# Patient Record
Sex: Female | Born: 1950 | Race: Black or African American | Hispanic: No | Marital: Single | Smoking: Former smoker
Health system: Southern US, Community
[De-identification: ages and names within clinical notes are randomized; demographics above are authoritative.]

## PROBLEM LIST (undated history)

## (undated) DIAGNOSIS — I729 Aneurysm of unspecified site: Secondary | ICD-10-CM

## (undated) DIAGNOSIS — I10 Essential (primary) hypertension: Secondary | ICD-10-CM

## (undated) DIAGNOSIS — R609 Edema, unspecified: Secondary | ICD-10-CM

## (undated) DIAGNOSIS — Z8719 Personal history of other diseases of the digestive system: Secondary | ICD-10-CM

## (undated) DIAGNOSIS — M359 Systemic involvement of connective tissue, unspecified: Secondary | ICD-10-CM

## (undated) DIAGNOSIS — J449 Chronic obstructive pulmonary disease, unspecified: Secondary | ICD-10-CM

## (undated) DIAGNOSIS — D649 Anemia, unspecified: Secondary | ICD-10-CM

## (undated) DIAGNOSIS — M1990 Unspecified osteoarthritis, unspecified site: Secondary | ICD-10-CM

## (undated) DIAGNOSIS — J45909 Unspecified asthma, uncomplicated: Secondary | ICD-10-CM

## (undated) DIAGNOSIS — E039 Hypothyroidism, unspecified: Secondary | ICD-10-CM

## (undated) DIAGNOSIS — D860 Sarcoidosis of lung: Secondary | ICD-10-CM

## (undated) DIAGNOSIS — Z87898 Personal history of other specified conditions: Secondary | ICD-10-CM

## (undated) DIAGNOSIS — I2510 Atherosclerotic heart disease of native coronary artery without angina pectoris: Secondary | ICD-10-CM

## (undated) DIAGNOSIS — G4730 Sleep apnea, unspecified: Secondary | ICD-10-CM

## (undated) DIAGNOSIS — E119 Type 2 diabetes mellitus without complications: Secondary | ICD-10-CM

## (undated) DIAGNOSIS — K219 Gastro-esophageal reflux disease without esophagitis: Secondary | ICD-10-CM

## (undated) DIAGNOSIS — F0390 Unspecified dementia without behavioral disturbance: Secondary | ICD-10-CM

## (undated) DIAGNOSIS — G629 Polyneuropathy, unspecified: Secondary | ICD-10-CM

## (undated) DIAGNOSIS — R569 Unspecified convulsions: Secondary | ICD-10-CM

## (undated) DIAGNOSIS — I739 Peripheral vascular disease, unspecified: Secondary | ICD-10-CM

## (undated) DIAGNOSIS — M109 Gout, unspecified: Secondary | ICD-10-CM

## (undated) DIAGNOSIS — R0902 Hypoxemia: Secondary | ICD-10-CM

## (undated) HISTORY — DX: Type 2 diabetes mellitus without complications: E11.9

## (undated) HISTORY — PX: STENT PLACEMENT VASCULAR (ARMC HX): HXRAD1737

## (undated) HISTORY — PX: FRACTURE SURGERY: SHX138

## (undated) HISTORY — DX: Unspecified dementia, unspecified severity, without behavioral disturbance, psychotic disturbance, mood disturbance, and anxiety: F03.90

## (undated) HISTORY — DX: Essential (primary) hypertension: I10

## (undated) HISTORY — PX: VEIN BYPASS SURGERY: SHX833

## (undated) HISTORY — DX: Unspecified osteoarthritis, unspecified site: M19.90

## (undated) HISTORY — DX: Unspecified asthma, uncomplicated: J45.909

## (undated) HISTORY — DX: Aneurysm of unspecified site: I72.9

## (undated) HISTORY — PX: CORONARY ARTERY BYPASS GRAFT: SHX141

---

## 1989-07-17 HISTORY — PX: CEREBRAL ANEURYSM REPAIR: SHX164

## 1989-07-17 HISTORY — PX: BRAIN SURGERY: SHX531

## 1998-11-16 HISTORY — PX: HERNIA REPAIR: SHX51

## 1999-07-03 DIAGNOSIS — I10 Essential (primary) hypertension: Secondary | ICD-10-CM | POA: Insufficient documentation

## 1999-07-18 HISTORY — PX: EYE SURGERY: SHX253

## 2004-09-22 ENCOUNTER — Other Ambulatory Visit: Payer: Self-pay

## 2004-09-22 ENCOUNTER — Emergency Department: Payer: Self-pay | Admitting: Emergency Medicine

## 2004-09-25 ENCOUNTER — Emergency Department: Payer: Self-pay | Admitting: Emergency Medicine

## 2006-04-11 ENCOUNTER — Other Ambulatory Visit: Payer: Self-pay

## 2006-04-11 ENCOUNTER — Inpatient Hospital Stay: Payer: Self-pay | Admitting: Internal Medicine

## 2006-05-03 ENCOUNTER — Ambulatory Visit: Payer: Self-pay | Admitting: Internal Medicine

## 2006-05-04 ENCOUNTER — Ambulatory Visit: Payer: Self-pay | Admitting: Gastroenterology

## 2006-05-16 ENCOUNTER — Ambulatory Visit: Payer: Self-pay | Admitting: Internal Medicine

## 2006-06-04 ENCOUNTER — Ambulatory Visit (HOSPITAL_COMMUNITY): Admission: RE | Admit: 2006-06-04 | Discharge: 2006-06-04 | Payer: Self-pay | Admitting: *Deleted

## 2006-06-16 ENCOUNTER — Observation Stay (HOSPITAL_COMMUNITY): Admission: RE | Admit: 2006-06-16 | Discharge: 2006-06-17 | Payer: Self-pay | Admitting: *Deleted

## 2006-08-04 ENCOUNTER — Observation Stay (HOSPITAL_COMMUNITY): Admission: AD | Admit: 2006-08-04 | Discharge: 2006-08-04 | Payer: Self-pay | Admitting: *Deleted

## 2007-01-24 ENCOUNTER — Ambulatory Visit (HOSPITAL_COMMUNITY): Admission: RE | Admit: 2007-01-24 | Discharge: 2007-01-24 | Payer: Self-pay | Admitting: *Deleted

## 2007-04-10 ENCOUNTER — Emergency Department: Payer: Self-pay | Admitting: Unknown Physician Specialty

## 2007-04-18 ENCOUNTER — Emergency Department: Payer: Self-pay | Admitting: Internal Medicine

## 2007-06-23 ENCOUNTER — Emergency Department: Payer: Self-pay | Admitting: Emergency Medicine

## 2007-06-24 ENCOUNTER — Other Ambulatory Visit: Payer: Self-pay

## 2007-08-08 ENCOUNTER — Emergency Department: Payer: Self-pay | Admitting: Emergency Medicine

## 2008-02-19 ENCOUNTER — Emergency Department: Payer: Self-pay | Admitting: Emergency Medicine

## 2008-02-20 ENCOUNTER — Other Ambulatory Visit: Payer: Self-pay

## 2008-02-25 ENCOUNTER — Emergency Department: Payer: Self-pay | Admitting: Emergency Medicine

## 2012-04-13 ENCOUNTER — Ambulatory Visit: Payer: Self-pay | Admitting: Gastroenterology

## 2012-10-21 DIAGNOSIS — M1710 Unilateral primary osteoarthritis, unspecified knee: Secondary | ICD-10-CM | POA: Insufficient documentation

## 2013-07-03 ENCOUNTER — Emergency Department: Payer: Self-pay | Admitting: Emergency Medicine

## 2013-07-03 LAB — URINALYSIS, COMPLETE
Bacteria: NONE SEEN
Bilirubin,UR: NEGATIVE
Blood: NEGATIVE
Glucose,UR: NEGATIVE mg/dL (ref 0–75)
Ketone: NEGATIVE
Ph: 5 (ref 4.5–8.0)
RBC,UR: <1 /HPF (ref 0–5)
WBC UR: 3 /HPF (ref 0–5)

## 2013-09-05 DIAGNOSIS — E118 Type 2 diabetes mellitus with unspecified complications: Secondary | ICD-10-CM | POA: Insufficient documentation

## 2013-09-11 ENCOUNTER — Ambulatory Visit: Payer: Self-pay | Admitting: Podiatry

## 2013-09-11 ENCOUNTER — Encounter: Payer: Self-pay | Admitting: *Deleted

## 2013-09-20 ENCOUNTER — Encounter: Payer: Self-pay | Admitting: Podiatry

## 2013-09-20 ENCOUNTER — Ambulatory Visit (INDEPENDENT_AMBULATORY_CARE_PROVIDER_SITE_OTHER): Payer: PRIVATE HEALTH INSURANCE

## 2013-09-20 ENCOUNTER — Ambulatory Visit (INDEPENDENT_AMBULATORY_CARE_PROVIDER_SITE_OTHER): Payer: PRIVATE HEALTH INSURANCE | Admitting: Podiatry

## 2013-09-20 VITALS — Ht 65.0 in | Wt 210.0 lb

## 2013-09-20 DIAGNOSIS — R52 Pain, unspecified: Secondary | ICD-10-CM

## 2013-09-20 DIAGNOSIS — B351 Tinea unguium: Secondary | ICD-10-CM

## 2013-09-20 DIAGNOSIS — M7750 Other enthesopathy of unspecified foot: Secondary | ICD-10-CM

## 2013-09-20 DIAGNOSIS — M778 Other enthesopathies, not elsewhere classified: Secondary | ICD-10-CM

## 2013-09-20 NOTE — Patient Instructions (Signed)

## 2013-09-20 NOTE — Progress Notes (Signed)
Chelsey [name] presents today complaining of gout times past 2 weeks to the first metatarsophalangeal joint of the bilateral foot. She states it should also like to have her toenails cut. She's been waiting for this appointment and that she's been waiting her got has got this better some degree.  Objective: Pulses barely palpable bilateral. Feet are warm to the touch. Capillary fill time is immediate. First and second metatarsophalangeal joints are warm to the touch. Nails are thick yellow dystrophic onychomycotic.  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 Onychomycosis 1 through 5 bilateral.  Plan: Injected to get milligrams of dexamethasone intra-articularly to each first metatarsophalangeal joint bilaterally. I also debrided her nails 1 through 5 bilateral. Followup with her in 3 months.

## 2013-09-26 ENCOUNTER — Telehealth: Payer: Self-pay | Admitting: *Deleted

## 2013-09-26 NOTE — Telephone Encounter (Signed)
DR HYATT GAVE ME TWO SHOTS LAST WEEK FOR THE GOUT AND IT IS NOT DOING ANY GOOD, I NEED TO KNOW WHAT ELSE I CAN DO ?

## 2013-09-26 NOTE — Telephone Encounter (Signed)
Inform Chelsey [name] that it may take more than a week for the medication to work.  She should follow up with me in the near future.

## 2013-09-27 NOTE — Telephone Encounter (Signed)
Spoke to [facility] told her to give the medication time to work if it does not get any better to follow up with dr Milinda Pointer

## 2013-12-20 ENCOUNTER — Encounter: Payer: Self-pay | Admitting: Podiatry

## 2013-12-20 ENCOUNTER — Ambulatory Visit (INDEPENDENT_AMBULATORY_CARE_PROVIDER_SITE_OTHER): Payer: PRIVATE HEALTH INSURANCE | Admitting: Podiatry

## 2013-12-20 VITALS — BP 129/70 | HR 80 | Resp 16

## 2013-12-20 DIAGNOSIS — B351 Tinea unguium: Secondary | ICD-10-CM

## 2013-12-20 DIAGNOSIS — E1149 Type 2 diabetes mellitus with other diabetic neurological complication: Secondary | ICD-10-CM

## 2013-12-20 DIAGNOSIS — Q828 Other specified congenital malformations of skin: Secondary | ICD-10-CM

## 2013-12-20 DIAGNOSIS — M79609 Pain in unspecified limb: Secondary | ICD-10-CM

## 2013-12-20 NOTE — Progress Notes (Signed)
She presents today for routine footcare complaining of corns and calluses plantar aspect of the bilateral foot and ingrown toenails.  Objective: Pulses are barely palpable bilateral. Reactive hyperkeratosis sub-first metatarsophalangeal joint is present bilateral. Nails are thick yellow dystrophic clinically mycotic and painful palpation.  Assessment: Pain in limb secondary to onychomycosis 1 through 5 bilateral.  Plan: Debridement nails 1 through 5 bilateral is cover service secondary to pain and debridement reactive hyperkeratosis bilateral.

## 2014-02-28 ENCOUNTER — Ambulatory Visit (INDEPENDENT_AMBULATORY_CARE_PROVIDER_SITE_OTHER): Payer: PRIVATE HEALTH INSURANCE

## 2014-02-28 ENCOUNTER — Ambulatory Visit (INDEPENDENT_AMBULATORY_CARE_PROVIDER_SITE_OTHER): Payer: PRIVATE HEALTH INSURANCE | Admitting: Podiatry

## 2014-02-28 VITALS — Resp 16 | Ht 65.0 in | Wt 195.0 lb

## 2014-02-28 DIAGNOSIS — B351 Tinea unguium: Secondary | ICD-10-CM

## 2014-02-28 DIAGNOSIS — M2040 Other hammer toe(s) (acquired), unspecified foot: Secondary | ICD-10-CM

## 2014-02-28 DIAGNOSIS — E1149 Type 2 diabetes mellitus with other diabetic neurological complication: Secondary | ICD-10-CM

## 2014-02-28 DIAGNOSIS — M79609 Pain in unspecified limb: Secondary | ICD-10-CM

## 2014-02-28 DIAGNOSIS — M7752 Other enthesopathy of left foot: Secondary | ICD-10-CM

## 2014-02-28 DIAGNOSIS — M7750 Other enthesopathy of unspecified foot: Secondary | ICD-10-CM

## 2014-02-28 DIAGNOSIS — Q828 Other specified congenital malformations of skin: Secondary | ICD-10-CM

## 2014-02-28 NOTE — Progress Notes (Signed)
She presents today a chief complaint of a painful hallux left her nails are also thick yellow dystrophic and clinically mycotic.  Objective: I have reviewed her past medical history medications and allergies she is has a history of diabetic peripheral neuropathy as well as diabetic angiopathy. Hammertoe deformities and a history of ulceration. Currently she has pain on palpation to the IP joint of the hallux left with nails that are thick yellow dystrophic and clinically mycotic.  Assessment: Non-insulin-dependent diabetes mellitus with a history of diabetic peripheral neuropathy and angiopathy and hammertoe deformity and a history of ulcer. Nails are thick yellow dystrophic with mycotic painful palpation. Capsulitis of the IP joint hallux left.  Plan: Injected the IP joint of the hallux left today with dexamethasone and local anesthetic debridement all reactive hyperkeratosis and she was measured for diabetic shoes.

## 2014-04-02 ENCOUNTER — Encounter: Payer: Self-pay | Admitting: *Deleted

## 2014-04-02 NOTE — Progress Notes (Signed)
Sent pt post card letting her know dr colford is not responding to the diabetic shoe request. Asked pt to contact her doctors office and find out why then to call us with the info she finds out.

## 2014-04-24 ENCOUNTER — Encounter: Payer: Self-pay | Admitting: *Deleted

## 2014-04-24 NOTE — Progress Notes (Signed)
Sent pt postcard letting her know diabetic shoes are here. 

## 2014-05-14 ENCOUNTER — Encounter: Payer: Self-pay | Admitting: Podiatry

## 2014-05-14 ENCOUNTER — Ambulatory Visit (INDEPENDENT_AMBULATORY_CARE_PROVIDER_SITE_OTHER): Payer: PRIVATE HEALTH INSURANCE | Admitting: *Deleted

## 2014-05-14 VITALS — BP 129/70 | HR 80 | Resp 16

## 2014-05-14 DIAGNOSIS — E1149 Type 2 diabetes mellitus with other diabetic neurological complication: Secondary | ICD-10-CM

## 2014-05-14 DIAGNOSIS — M2040 Other hammer toe(s) (acquired), unspecified foot: Secondary | ICD-10-CM

## 2014-05-14 NOTE — Progress Notes (Signed)
Pt presents for pick up for diabetic shoes.

## 2014-05-29 ENCOUNTER — Emergency Department: Payer: Self-pay | Admitting: Internal Medicine

## 2014-06-04 ENCOUNTER — Encounter: Payer: Self-pay | Admitting: Podiatry

## 2014-06-04 ENCOUNTER — Ambulatory Visit (INDEPENDENT_AMBULATORY_CARE_PROVIDER_SITE_OTHER): Payer: PRIVATE HEALTH INSURANCE | Admitting: Podiatry

## 2014-06-04 ENCOUNTER — Ambulatory Visit (INDEPENDENT_AMBULATORY_CARE_PROVIDER_SITE_OTHER): Payer: PRIVATE HEALTH INSURANCE

## 2014-06-04 DIAGNOSIS — Q828 Other specified congenital malformations of skin: Secondary | ICD-10-CM

## 2014-06-04 DIAGNOSIS — Q6652 Congenital pes planus, left foot: Secondary | ICD-10-CM

## 2014-06-04 DIAGNOSIS — M79676 Pain in unspecified toe(s): Secondary | ICD-10-CM

## 2014-06-04 DIAGNOSIS — Q6650 Congenital pes planus, unspecified foot: Secondary | ICD-10-CM

## 2014-06-04 DIAGNOSIS — E1149 Type 2 diabetes mellitus with other diabetic neurological complication: Secondary | ICD-10-CM

## 2014-06-04 DIAGNOSIS — M779 Enthesopathy, unspecified: Secondary | ICD-10-CM

## 2014-06-04 DIAGNOSIS — M79609 Pain in unspecified limb: Secondary | ICD-10-CM

## 2014-06-04 DIAGNOSIS — B351 Tinea unguium: Secondary | ICD-10-CM

## 2014-06-04 NOTE — Progress Notes (Signed)
Chelsey [name] presents today chief complaint of painful E. elongated toenails with corns and calluses. She states her diabetes is doing about the same. States that she has made her recent trip to the emergency department for a diagnosis of gout back in here a she points to the subtalar joint bilaterally.  Objective evaluation reveals vital signs are stable she is alert and oriented x3. Pulses are palpable bilateral. She has pain on palpation and on end range of motion of the subtalar joint bilateral. There is no erythema no edema no cellulitis drainage or odor and there is no warmth to touch. Radiographic evaluation does not demonstrate any type of osseous abnormalities in this area other than a cyst to the cuboid left foot. Nails are thick yellow dystrophic clinically mycotic. Multiple areas of porokeratosis to the plantar aspect of the bilateral foot. No ulcerations are noted.  Assessment: Painful capsulitis of the subtalar joint bilateral left greater than right. Pain in limb secondary to onychomycosis 1 through 5 bilateral and porokeratosis bilateral. This is associated with diabetes.  Plan: Debridement of all reactive hyperkeratosis bilateral. Debridement of nails 1 through 5 bilateral covered service secondary to pain. And injected the bilateral subtalar joints today after sterile Betadine skin prep. Followup with her in 3 months for nail trim.

## 2014-07-11 DIAGNOSIS — E034 Atrophy of thyroid (acquired): Secondary | ICD-10-CM | POA: Insufficient documentation

## 2014-09-05 ENCOUNTER — Ambulatory Visit: Payer: PRIVATE HEALTH INSURANCE | Admitting: Podiatry

## 2014-10-10 DIAGNOSIS — K760 Fatty (change of) liver, not elsewhere classified: Secondary | ICD-10-CM | POA: Insufficient documentation

## 2014-10-24 ENCOUNTER — Ambulatory Visit (INDEPENDENT_AMBULATORY_CARE_PROVIDER_SITE_OTHER): Payer: PRIVATE HEALTH INSURANCE | Admitting: Podiatry

## 2014-10-24 DIAGNOSIS — E1142 Type 2 diabetes mellitus with diabetic polyneuropathy: Secondary | ICD-10-CM

## 2014-10-24 DIAGNOSIS — Q828 Other specified congenital malformations of skin: Secondary | ICD-10-CM

## 2014-10-24 DIAGNOSIS — M79676 Pain in unspecified toe(s): Secondary | ICD-10-CM

## 2014-10-24 DIAGNOSIS — B351 Tinea unguium: Secondary | ICD-10-CM

## 2014-10-24 NOTE — Progress Notes (Signed)
Presents today chief complaint of painful elongated toenails and painful calluses  Objective: Pulses are palpable bilateral nails are thick, yellow dystrophic onychomycosis and painful palpation. Porokeratosis  Assessment: Onychomycosis with pain in limb.Diabetes and neuropathy with porokeratosis  Plan: Treatment of nails in thickness and length as covered service secondary to pain. Debrided porokeratosis.

## 2014-11-05 ENCOUNTER — Emergency Department: Payer: Self-pay | Admitting: Emergency Medicine

## 2015-01-23 ENCOUNTER — Emergency Department: Payer: Self-pay | Admitting: Emergency Medicine

## 2015-01-28 ENCOUNTER — Ambulatory Visit (INDEPENDENT_AMBULATORY_CARE_PROVIDER_SITE_OTHER): Payer: Medicare Other | Admitting: Podiatry

## 2015-01-28 ENCOUNTER — Other Ambulatory Visit: Payer: PRIVATE HEALTH INSURANCE

## 2015-01-28 DIAGNOSIS — M79676 Pain in unspecified toe(s): Secondary | ICD-10-CM

## 2015-01-28 DIAGNOSIS — B351 Tinea unguium: Secondary | ICD-10-CM

## 2015-01-28 NOTE — Progress Notes (Signed)
Presents today chief complaint of painful elongated toenails.  Objective: Pulses are palpable bilateral nails are thick, yellow dystrophic onychomycosis and painful palpation.   Assessment: Onychomycosis with pain in limb.  Plan: Treatment of nails in thickness and length as covered service secondary to pain.  

## 2015-04-29 DIAGNOSIS — G4736 Sleep related hypoventilation in conditions classified elsewhere: Secondary | ICD-10-CM | POA: Insufficient documentation

## 2015-05-13 ENCOUNTER — Ambulatory Visit (INDEPENDENT_AMBULATORY_CARE_PROVIDER_SITE_OTHER): Payer: Medicare Other | Admitting: Podiatry

## 2015-05-13 DIAGNOSIS — B351 Tinea unguium: Secondary | ICD-10-CM

## 2015-05-13 DIAGNOSIS — M79676 Pain in unspecified toe(s): Secondary | ICD-10-CM

## 2015-05-13 DIAGNOSIS — Q828 Other specified congenital malformations of skin: Secondary | ICD-10-CM | POA: Diagnosis not present

## 2015-05-13 DIAGNOSIS — E1142 Type 2 diabetes mellitus with diabetic polyneuropathy: Secondary | ICD-10-CM

## 2015-05-13 LAB — HM DIABETES FOOT EXAM

## 2015-05-13 NOTE — Progress Notes (Signed)
Presents today chief complaint of painful elongated toenails.  Objective: Pulses are palpable bilateral 1-5  nails are thick, yellow dystrophic onychomycosis and painful palpation.   Assessment: Onychomycosis with pain in limb.  Plan: Treatment of nails 1-5 bilateral in thickness and length as covered service secondary to pain.            Mild hallux pinch callus reduced Bilateral.

## 2015-07-06 ENCOUNTER — Emergency Department
Admission: EM | Admit: 2015-07-06 | Discharge: 2015-07-06 | Disposition: A | Discharge status: Home / Self Care | Payer: Medicare Other | Attending: Emergency Medicine | Admitting: Emergency Medicine

## 2015-07-06 ENCOUNTER — Encounter: Payer: Self-pay | Admitting: Emergency Medicine

## 2015-07-06 DIAGNOSIS — Z7982 Long term (current) use of aspirin: Secondary | ICD-10-CM | POA: Diagnosis not present

## 2015-07-06 DIAGNOSIS — R739 Hyperglycemia, unspecified: Secondary | ICD-10-CM

## 2015-07-06 DIAGNOSIS — I10 Essential (primary) hypertension: Secondary | ICD-10-CM | POA: Diagnosis not present

## 2015-07-06 DIAGNOSIS — Z794 Long term (current) use of insulin: Secondary | ICD-10-CM | POA: Insufficient documentation

## 2015-07-06 DIAGNOSIS — Z7951 Long term (current) use of inhaled steroids: Secondary | ICD-10-CM | POA: Diagnosis not present

## 2015-07-06 DIAGNOSIS — F419 Anxiety disorder, unspecified: Secondary | ICD-10-CM | POA: Diagnosis not present

## 2015-07-06 DIAGNOSIS — Z87891 Personal history of nicotine dependence: Secondary | ICD-10-CM | POA: Diagnosis not present

## 2015-07-06 DIAGNOSIS — E1165 Type 2 diabetes mellitus with hyperglycemia: Secondary | ICD-10-CM | POA: Diagnosis not present

## 2015-07-06 DIAGNOSIS — Z79899 Other long term (current) drug therapy: Secondary | ICD-10-CM | POA: Diagnosis not present

## 2015-07-06 LAB — URINALYSIS COMPLETE WITH MICROSCOPIC (ARMC ONLY)
BILIRUBIN URINE: NEGATIVE
Bacteria, UA: NONE SEEN
Glucose, UA: 50 mg/dL — AB
Hgb urine dipstick: NEGATIVE
Leukocytes, UA: NEGATIVE
Nitrite: NEGATIVE
Protein, ur: NEGATIVE mg/dL
RBC / HPF: NONE SEEN RBC/hpf (ref 0–5)
Specific Gravity, Urine: 1.015 (ref 1.005–1.030)
pH: 6 (ref 5.0–8.0)

## 2015-07-06 LAB — CBC WITH DIFFERENTIAL/PLATELET
BASOS ABS: 0.1 10*3/uL (ref 0–0.1)
Basophils Relative: 1 %
Eosinophils Absolute: 0.3 10*3/uL (ref 0–0.7)
Eosinophils Relative: 6 %
HEMATOCRIT: 36.5 % (ref 35.0–47.0)
Hemoglobin: 11.6 g/dL — ABNORMAL LOW (ref 12.0–16.0)
LYMPHS ABS: 1.4 10*3/uL (ref 1.0–3.6)
LYMPHS PCT: 28 %
MCH: 24 pg — AB (ref 26.0–34.0)
MCHC: 31.7 g/dL — ABNORMAL LOW (ref 32.0–36.0)
MCV: 75.8 fL — AB (ref 80.0–100.0)
MONO ABS: 0.3 10*3/uL (ref 0.2–0.9)
MONOS PCT: 6 %
NEUTROS ABS: 3 10*3/uL (ref 1.4–6.5)
Neutrophils Relative %: 59 %
Platelets: 209 10*3/uL (ref 150–440)
RBC: 4.82 MIL/uL (ref 3.80–5.20)
RDW: 17.5 % — AB (ref 11.5–14.5)
WBC: 5 10*3/uL (ref 3.6–11.0)

## 2015-07-06 LAB — COMPREHENSIVE METABOLIC PANEL
ALT: 34 U/L (ref 14–54)
AST: 62 U/L — AB (ref 15–41)
Albumin: 4.1 g/dL (ref 3.5–5.0)
Alkaline Phosphatase: 93 U/L (ref 38–126)
Anion gap: 12 (ref 5–15)
BILIRUBIN TOTAL: 0.7 mg/dL (ref 0.3–1.2)
BUN: 15 mg/dL (ref 6–20)
CO2: 22 mmol/L (ref 22–32)
CREATININE: 1.01 mg/dL — AB (ref 0.44–1.00)
Calcium: 9.4 mg/dL (ref 8.9–10.3)
Chloride: 104 mmol/L (ref 101–111)
GFR calc Af Amer: >60 mL/min (ref >60)
GFR, EST NON AFRICAN AMERICAN: 57 mL/min — AB (ref >60)
Glucose, Bld: 221 mg/dL — ABNORMAL HIGH (ref 65–99)
POTASSIUM: 3.8 mmol/L (ref 3.5–5.1)
Sodium: 138 mmol/L (ref 135–145)
TOTAL PROTEIN: 7.7 g/dL (ref 6.5–8.1)

## 2015-07-06 LAB — GLUCOSE, CAPILLARY: Glucose-Capillary: 227 mg/dL — ABNORMAL HIGH (ref 65–99)

## 2015-07-06 NOTE — ED Notes (Signed)
Patient states she does not have instructions to follow from MD for elevated blood sugar, takes consistent meds every day

## 2015-07-06 NOTE — Discharge Instructions (Signed)

## 2015-07-06 NOTE — ED Notes (Addendum)
Patient with no complaints at this time. Respirations even and unlabored. Skin warm/dry. Discharge instructions reviewed with patient at this time. Patient given opportunity to voice concerns/ask questions. IV removed per policy and band-aid applied to site. Patient discharged at this time and left Emergency Department, with steady gait.

## 2015-07-07 NOTE — ED Provider Notes (Signed)
[facility] Emergency Department Provider Note  ____________________________________________  Time seen: On arrival  I have reviewed the triage vital signs and the nursing notes.   HISTORY  Chief Complaint Hyperglycemia    HPI Chelsey [name] is a 64 y.o. female presents with complaints of hyperglycemia. She reports her sugars as high as 250. She does not have any physical complaints. No fevers no chills. No cough. No shortness of breath. No dysuria.     Past Medical History  Diagnosis Date  . Asthma   . Hypertension   . Arthritis   . Diabetes mellitus without complication     There are no active problems to display for this patient.   Past Surgical History  Procedure Laterality Date  . Hernia repair    . Brain surgery    . Cerebral aneurysm repair      Current Outpatient Rx  Name  Route  Sig  Dispense  Refill  . albuterol (PROAIR HFA) 108 (90 BASE) MCG/ACT inhaler   Inhalation   Inhale 2 puffs into the lungs every 6 (six) hours as needed. For wheezing or shortness of breath.         . allopurinol (ZYLOPRIM) 100 MG tablet   Oral   Take 200 mg by mouth daily.          Marland Kitchen amLODipine (NORVASC) 10 MG tablet   Oral   Take 10 mg by mouth daily.          Marland Kitchen aspirin EC 81 MG tablet   Oral   Take 81 mg by mouth daily.         . Blood Glucose Calibration (TAI DOC CONTROL) NORMAL SOLN               . Blood Glucose Monitoring Suppl (ACCU-CHEK AVIVA PLUS) W/DEVICE KIT      Use as directed.         Marland Kitchen COLCRYS 0.6 MG tablet   Oral   Take 0.6 mg by mouth 2 (two) times daily as needed (for gout flares.).          Marland Kitchen ferrous sulfate 325 (65 FE) MG tablet   Oral   Take 325 mg by mouth 2 (two) times daily.         . fluticasone (FLONASE) 50 MCG/ACT nasal spray   Each Nare   Place 2 sprays into both nostrils daily as needed. For rhinitis and allergies.         . folic acid (FOLVITE) 1 MG tablet   Oral   Take 1 mg by mouth  daily.         . furosemide (LASIX) 40 MG tablet   Oral   Take 40 mg by mouth daily.          Marland Kitchen gabapentin (NEURONTIN) 300 MG capsule   Oral   Take 300 mg by mouth 3 (three) times daily.         Marland Kitchen glucose blood test strip      1 each daily. Use 1 test strip daily.         . insulin lispro (HUMALOG KWIKPEN) 100 UNIT/ML KiwkPen   Subcutaneous   Inject 20 Units into the skin 3 (three) times daily before meals.         Marland Kitchen LANCETS ULTRA THIN MISC   Topical   Apply 1 each topically daily.          Marland Kitchen LANTUS 100 UNIT/ML injection   Subcutaneous  Inject 45 Units into the skin 2 (two) times daily.          Marland Kitchen levothyroxine (SYNTHROID, LEVOTHROID) 88 MCG tablet   Oral   Take 88 mcg by mouth daily before breakfast.          . lisinopril (PRINIVIL,ZESTRIL) 40 MG tablet   Oral   Take 40 mg by mouth daily.         . metFORMIN (GLUCOPHAGE) 1000 MG tablet      1,000 mg 2 (two) times daily with a meal.          . omeprazole (PRILOSEC) 20 MG capsule   Oral   Take 20 mg by mouth daily.          . simvastatin (ZOCOR) 20 MG tablet   Oral   Take 20 mg by mouth every morning.            Allergies Indomethacin and Percocet  No family history on file.  Social History Social History  Substance Use Topics  . Smoking status: Former Research scientist (life sciences)  . Smokeless tobacco: Never Used     Comment: quit   . Alcohol Use: No    Review of Systems  Constitutional: Negative for fever. Eyes: Negative for visual changes. ENT: Negative for sore throat Cardiovascular: Negative for chest pain. Respiratory: Negative for shortness of breath. Gastrointestinal: Negative for abdominal pain, vomiting and diarrhea. Genitourinary: Negative for dysuria. Musculoskeletal: Negative for back pain. Skin: Negative for rash. Neurological: Negative for headaches or focal weakness Psychiatric: Mild anxiety    ____________________________________________   PHYSICAL EXAM:  VITAL  SIGNS: ED Triage Vitals  Enc Vitals Group     BP 07/06/15 1638 166/75 mmHg     Pulse Rate 07/06/15 1638 76     Resp 07/06/15 1638 18     Temp 07/06/15 1638 98.2 F (36.8 C)     Temp Source 07/06/15 1638 Oral     SpO2 07/06/15 1638 94 %     Weight 07/06/15 1638 209 lb (94.802 kg)     Height 07/06/15 1638 5' 3"  (1.6 m)     Head Cir --      Peak Flow --      Pain Score --      Pain Loc --      Pain Edu? --      Excl. in [place]? --      Constitutional: Alert and oriented. Well appearing and in no distress. Eyes: Conjunctivae are normal.  ENT   Head: Normocephalic and atraumatic.   Mouth/Throat: Mucous membranes are moist. Cardiovascular: Normal rate, regular rhythm. Normal and symmetric distal pulses are present in all extremities. No murmurs, rubs, or gallops. Respiratory: Normal respiratory effort without tachypnea nor retractions. Breath sounds are clear and equal bilaterally.  Gastrointestinal: Soft and non-tender in all quadrants. No distention. There is no CVA tenderness. Genitourinary: deferred Musculoskeletal: Nontender with normal range of motion in all extremities. No lower extremity tenderness nor edema. Neurologic:  Normal speech and language. No gross focal neurologic deficits are appreciated. Skin:  Skin is warm, dry and intact. No rash noted. Psychiatric: Mood and affect are normal. Patient exhibits appropriate insight and judgment.  ____________________________________________    LABS (pertinent positives/negatives)  Labs Reviewed  CBC WITH DIFFERENTIAL/PLATELET - Abnormal; Notable for the following:    Hemoglobin 11.6 (*)    MCV 75.8 (*)    MCH 24.0 (*)    MCHC 31.7 (*)    RDW 17.5 (*)    All  other components within normal limits  COMPREHENSIVE METABOLIC PANEL - Abnormal; Notable for the following:    Glucose, Bld 221 (*)    Creatinine, Ser 1.01 (*)    AST 62 (*)    GFR calc non Af Amer 57 (*)    All other components within normal limits   URINALYSIS COMPLETEWITH MICROSCOPIC ([facility] ONLY) - Abnormal; Notable for the following:    Color, Urine STRAW (*)    APPearance CLEAR (*)    Glucose, UA 50 (*)    Ketones, ur TRACE (*)    Squamous Epithelial / LPF 0-5 (*)    All other components within normal limits  GLUCOSE, CAPILLARY - Abnormal; Notable for the following:    Glucose-Capillary 227 (*)    All other components within normal limits    ____________________________________________   EKG  None  ____________________________________________    RADIOLOGY I have personally reviewed any xrays that were ordered on this patient: None  ____________________________________________   PROCEDURES  Procedure(s) performed: none  Critical Care performed:none  ____________________________________________   INITIAL IMPRESSION / ASSESSMENT AND PLAN / ED COURSE  Pertinent labs & imaging results that were available during my care of the patient were reviewed by me and considered in my medical decision making (see chart for details).  Patient's blood glucose is only 227 in the emergency department. Her anion gap is normal. Her labs are otherwise unremarkable. Her urinalysis is unremarkable. She feels well has no physical complaints. Reassurance provided. I have asked her to follow-up with her PCP  ____________________________________________   FINAL CLINICAL IMPRESSION(S) / ED DIAGNOSES  Final diagnoses:  Hyperglycemia     Lavonia Drafts, MD 07/07/15 0025

## 2015-08-05 DIAGNOSIS — M1A09X0 Idiopathic chronic gout, multiple sites, without tophus (tophi): Secondary | ICD-10-CM | POA: Insufficient documentation

## 2015-08-13 ENCOUNTER — Ambulatory Visit (INDEPENDENT_AMBULATORY_CARE_PROVIDER_SITE_OTHER): Payer: Medicare Other | Admitting: Podiatry

## 2015-08-13 DIAGNOSIS — Q828 Other specified congenital malformations of skin: Secondary | ICD-10-CM

## 2015-08-13 DIAGNOSIS — B351 Tinea unguium: Secondary | ICD-10-CM | POA: Diagnosis not present

## 2015-08-13 DIAGNOSIS — E1142 Type 2 diabetes mellitus with diabetic polyneuropathy: Secondary | ICD-10-CM | POA: Diagnosis not present

## 2015-08-13 DIAGNOSIS — M79676 Pain in unspecified toe(s): Secondary | ICD-10-CM | POA: Diagnosis not present

## 2015-08-13 NOTE — Progress Notes (Signed)
Subjective: 64 y.o. returns the office today for painful, elongated, thickened toenails which she is unable to trim herslef. Denies any redness or drainage around the nails. Denies any acute changes since last appointment and no new complaints today. Denies any systemic complaints such as fevers, chills, nausea, vomiting.   Objective: AAO 3, NAD DP/PT pulses palpable, CRT less than 3 seconds Protective sensation decreased with Simms Weinstein monofilament Nails hypertrophic, dystrophic, elongated, brittle, discolored 10.There is tenderness overlying the nails 1-5 bilaterally. There is no surrounding erythema or drainage along the nail sites. Medial hallux hyperkeratotic lesions. Upon debridement no underlying ulceration, drainage or other signs of infection. No open lesions or pre-ulcerative lesions are identified. No other areas of tenderness bilateral lower extremities. No overlying edema, erythema, increased warmth. Hammertoe contractures bilaterally.  No pain with calf compression, swelling, warmth, erythema.  Assessment: Patient presents with symptomatic onychomycosis; type 2 diabetes and neuropathy  Plan: -Treatment options including alternatives, risks, complications were discussed -Nails sharply debrided 10 without complication/bleeding. -Paperwork was completed for diabetic shoe precertification. -Discussed daily foot inspection. If there are any changes, to call the office immediately.  -Follow-up in 3 months or sooner if any problems are to arise. In the meantime, encouraged to call the office with any questions, concerns, changes symptoms.  Celesta Gentile, DPM

## 2015-09-26 ENCOUNTER — Ambulatory Visit: Payer: Medicare Other | Admitting: *Deleted

## 2015-09-26 DIAGNOSIS — E1142 Type 2 diabetes mellitus with diabetic polyneuropathy: Secondary | ICD-10-CM

## 2015-09-26 NOTE — Progress Notes (Signed)
Patient ID: Chelsey [name], female   DOB: [birth year], 64 y.o.   MRN: [id number] Patient presents to be scanned and measured for diabetic shoes and inserts.

## 2015-10-07 ENCOUNTER — Ambulatory Visit: Payer: Medicare Other | Admitting: Internal Medicine

## 2015-10-23 ENCOUNTER — Ambulatory Visit
Admission: RE | Admit: 2015-10-23 | Discharge: 2015-10-23 | Disposition: A | Discharge status: Home / Self Care | Payer: Medicare Other | Source: Ambulatory Visit | Attending: Internal Medicine | Admitting: Internal Medicine

## 2015-10-23 ENCOUNTER — Other Ambulatory Visit: Payer: Self-pay | Admitting: Internal Medicine

## 2015-10-23 DIAGNOSIS — D869 Sarcoidosis, unspecified: Secondary | ICD-10-CM | POA: Diagnosis not present

## 2015-10-23 DIAGNOSIS — D8681 Sarcoid meningitis: Secondary | ICD-10-CM

## 2015-10-23 DIAGNOSIS — R590 Localized enlarged lymph nodes: Secondary | ICD-10-CM | POA: Insufficient documentation

## 2015-11-12 ENCOUNTER — Encounter: Payer: Self-pay | Admitting: Sports Medicine

## 2015-11-12 ENCOUNTER — Ambulatory Visit (INDEPENDENT_AMBULATORY_CARE_PROVIDER_SITE_OTHER): Payer: Medicare Other | Admitting: Sports Medicine

## 2015-11-12 DIAGNOSIS — M79676 Pain in unspecified toe(s): Secondary | ICD-10-CM | POA: Diagnosis not present

## 2015-11-12 DIAGNOSIS — Q828 Other specified congenital malformations of skin: Secondary | ICD-10-CM

## 2015-11-12 DIAGNOSIS — E1142 Type 2 diabetes mellitus with diabetic polyneuropathy: Secondary | ICD-10-CM

## 2015-11-12 DIAGNOSIS — B351 Tinea unguium: Secondary | ICD-10-CM | POA: Diagnosis not present

## 2015-11-12 NOTE — Progress Notes (Signed)
Patient ID: Chelsey [name], female   DOB: [birth year], 64 y.o.   MRN: [id number] Subjective: Chelsey [name] is a 64 y.o. female patient with history of type 2 diabetes who presents to office today complaining callus and  long, painful nails  while ambulating in shoes; unable to trim. Patient states that the glucose reading this morning was 100 mg/dl. Patient denies any new changes in medication or new problems. Patient denies any new cramping, numbness, burning or tingling in the legs.  There are no active problems to display for this patient.  Current Outpatient Prescriptions on File Prior to Visit  Medication Sig Dispense Refill  . albuterol (PROAIR HFA) 108 (90 BASE) MCG/ACT inhaler Inhale 2 puffs into the lungs every 6 (six) hours as needed. For wheezing or shortness of breath.    . allopurinol (ZYLOPRIM) 100 MG tablet Take 200 mg by mouth daily.     Marland Kitchen amLODipine (NORVASC) 10 MG tablet Take 10 mg by mouth daily.     Marland Kitchen aspirin EC 81 MG tablet Take 81 mg by mouth daily.    . Blood Glucose Calibration (TAI DOC CONTROL) NORMAL SOLN     . Blood Glucose Monitoring Suppl (ACCU-CHEK AVIVA PLUS) W/DEVICE KIT Use as directed.    Marland Kitchen COLCRYS 0.6 MG tablet Take 0.6 mg by mouth 2 (two) times daily as needed (for gout flares.).     Marland Kitchen ferrous sulfate 325 (65 FE) MG tablet Take 325 mg by mouth 2 (two) times daily.    . fluticasone (FLONASE) 50 MCG/ACT nasal spray Place 2 sprays into both nostrils daily as needed. For rhinitis and allergies.    . folic acid (FOLVITE) 1 MG tablet Take 1 mg by mouth daily.    . furosemide (LASIX) 40 MG tablet Take 40 mg by mouth daily.     Marland Kitchen gabapentin (NEURONTIN) 300 MG capsule Take 300 mg by mouth 3 (three) times daily.    Marland Kitchen glucose blood test strip 1 each daily. Use 1 test strip daily.    . insulin lispro (HUMALOG KWIKPEN) 100 UNIT/ML KiwkPen Inject 20 Units into the skin 3 (three) times daily before meals.    Marland Kitchen LANCETS ULTRA THIN MISC Apply 1 each topically daily.     Marland Kitchen  LANTUS 100 UNIT/ML injection Inject 45 Units into the skin 2 (two) times daily.     Marland Kitchen levothyroxine (SYNTHROID, LEVOTHROID) 88 MCG tablet Take 88 mcg by mouth daily before breakfast.     . lisinopril (PRINIVIL,ZESTRIL) 40 MG tablet Take 40 mg by mouth daily.    . metFORMIN (GLUCOPHAGE) 1000 MG tablet 1,000 mg 2 (two) times daily with a meal.     . omeprazole (PRILOSEC) 20 MG capsule Take 20 mg by mouth daily.     . simvastatin (ZOCOR) 20 MG tablet Take 20 mg by mouth every morning.      No current facility-administered medications on file prior to visit.   Allergies  Allergen Reactions  . Indomethacin Other (See Comments)    BURN HOLE IN STOMACH  . Percocet [Oxycodone-Acetaminophen] Other (See Comments)    HALLUCINATIONS    Labs: HEMOGLOBIN A1C- No recent lab on file  Objective: General: Patient is awake, alert, and oriented x 3 and in no acute distress.  Integument: Skin is warm, dry and supple bilateral. Nails are tender, long, thickened and  dystrophic with subungual debris, consistent with onychomycosis, 1-5 bilateral. No signs of infection. No open lesions bilateral. Mild callus with nucleated core at left hallux  with no signs of infection. Remaining integument unremarkable.  Vasculature:  Dorsalis Pedis pulse 2/4 bilateral. Posterior Tibial pulse  1/4 bilateral.  Capillary fill time <3 sec 1-5 bilateral. Positive hair growth to the level of the digits. Temperature gradient within normal limits. No varicosities present bilateral. No edema present bilateral.   Neurology: The patient has intact sensation measured with a 5.07/10g Semmes Weinstein Monofilament at all pedal sites bilateral . Vibratory sensation diminished bilateral with tuning fork. No Babinski sign present bilateral.   Musculoskeletal: No gross pedal deformities noted bilateral. Muscular strength 5/5 in all lower extremity muscular groups bilateral without pain or limitation on range of motion . No tenderness with  calf compression bilateral.  Assessment and Plan: Problem List Items Addressed This Visit    None    Visit Diagnoses    Dermatophytosis of nail    -  Primary    Porokeratosis        Diabetic polyneuropathy associated with type 2 diabetes mellitus (HCC)        Pain of toe, unspecified laterality          -Examined patient. -Discussed and educated patient on diabetic foot care, especially with  regards to the vascular, neurological and musculoskeletal systems.  -Stressed the importance of good glycemic control and the detriment of not  controlling glucose levels in relation to the foot. -Mechanically debrided porokeratosis x 1 using sterile chisel blade and all nails 1-5 bilateral using sterile nail nipper and filed with dremel without incident  -Answered all patient questions -Patient to return  in 3 months for at risk foot care; patient awaiting diabetic shoes; intraoffice message sent to [facility] to follow up on status of shoes -Patient advised to call the office if any problems or questions arise in the meantime.  Landis Martins, DPM

## 2015-11-21 ENCOUNTER — Ambulatory Visit (INDEPENDENT_AMBULATORY_CARE_PROVIDER_SITE_OTHER): Payer: Medicare Other | Admitting: Podiatry

## 2015-11-21 ENCOUNTER — Encounter: Payer: Self-pay | Admitting: Podiatry

## 2015-11-21 ENCOUNTER — Ambulatory Visit (INDEPENDENT_AMBULATORY_CARE_PROVIDER_SITE_OTHER): Payer: Medicare Other

## 2015-11-21 DIAGNOSIS — M109 Gout, unspecified: Secondary | ICD-10-CM | POA: Diagnosis not present

## 2015-11-21 DIAGNOSIS — R52 Pain, unspecified: Secondary | ICD-10-CM

## 2015-11-21 DIAGNOSIS — M779 Enthesopathy, unspecified: Secondary | ICD-10-CM

## 2015-11-21 NOTE — Patient Instructions (Signed)

## 2015-11-22 NOTE — Progress Notes (Signed)
Patient ID: Chelsey [name], female   DOB: 09-Dec-1950, 65 y.o.   MRN: [id number]  Subjective: 65 year old female presents the office today for concerns of possible gout to her left foot which started on Sunday. She states the areas become very tender to around her ankle joint as well as red and warm. She's had gout before and she feels it feels the same as before. No recent injury or trauma. Her blood sugar has been in the 170s. She takes allopurinol and colchicine already. Denies any systemic complaints such as fevers, chills, nausea, vomiting. No acute changes since last appointment, and no other complaints at this time.   Objective: AAO x3, NAD DP/PT pulses palpable bilaterally, CRT less than 3 seconds There is tenderness palpation to the left foot on the lateral aspect on the sinus tarsi. There is localized edema to this area without any significant erythema or increase in warmth. There is pain with subtalar joint range of motion. No other area pinpoint bony tenderness and there is no pain the vibratory sensation. No edema, erythema, increase in warmth to bilateral lower extremities.  No open lesions or pre-ulcerative lesions.  No pain with calf compression, swelling, warmth, erythema  Assessment: 65 year old female with left foot pain, capsulitis, possible gout  Plan: -All treatment options discussed with the patient including all alternatives, risks, complications.  -X-rays were obtained and reviewed with the patient.  -Discussed steroid injections the areas for which she wishes to proceed with. On sterile conditions a mixture of dexamethasone phosphate and 2% lidocaine plain was infiltrated into the sinus tarsi without any complications. Post procedure injections were discussed with the patient. -We'll hold off on oral steroids given her sugar. She is apparently already on colchicine and allopurinol. She's allergic to indomethacin. -Follow-up in 2-3 weeks if symptoms continue or sooner if  any problems arise. In the meantime, encouraged to call the office with any questions, concerns, change in symptoms.   Celesta Gentile, DPM

## 2015-12-10 DIAGNOSIS — Q6652 Congenital pes planus, left foot: Secondary | ICD-10-CM | POA: Insufficient documentation

## 2015-12-10 DIAGNOSIS — G8929 Other chronic pain: Secondary | ICD-10-CM | POA: Insufficient documentation

## 2015-12-10 DIAGNOSIS — D860 Sarcoidosis of lung: Secondary | ICD-10-CM | POA: Insufficient documentation

## 2015-12-10 DIAGNOSIS — D869 Sarcoidosis, unspecified: Secondary | ICD-10-CM | POA: Insufficient documentation

## 2015-12-10 DIAGNOSIS — Q6651 Congenital pes planus, right foot: Secondary | ICD-10-CM | POA: Insufficient documentation

## 2015-12-10 DIAGNOSIS — M79673 Pain in unspecified foot: Secondary | ICD-10-CM | POA: Insufficient documentation

## 2015-12-10 DIAGNOSIS — M79672 Pain in left foot: Secondary | ICD-10-CM

## 2015-12-10 DIAGNOSIS — M1A00X0 Idiopathic chronic gout, unspecified site, without tophus (tophi): Secondary | ICD-10-CM | POA: Insufficient documentation

## 2015-12-18 ENCOUNTER — Ambulatory Visit (INDEPENDENT_AMBULATORY_CARE_PROVIDER_SITE_OTHER): Payer: Medicare Other | Admitting: *Deleted

## 2015-12-18 ENCOUNTER — Other Ambulatory Visit: Payer: Medicare Other

## 2015-12-18 DIAGNOSIS — M7752 Other enthesopathy of left foot: Secondary | ICD-10-CM

## 2015-12-18 DIAGNOSIS — Q6652 Congenital pes planus, left foot: Secondary | ICD-10-CM

## 2015-12-18 DIAGNOSIS — E1142 Type 2 diabetes mellitus with diabetic polyneuropathy: Secondary | ICD-10-CM

## 2015-12-20 NOTE — Progress Notes (Signed)
Patient presents to pick up diabetic shoes. Patient saw our certified pedorthist, Benjie Karvonen, and patient states, "those were not the shoes I ordered". I will return the shoes and order the shoes she choose today.

## 2016-01-12 ENCOUNTER — Encounter: Payer: Self-pay | Admitting: *Deleted

## 2016-01-12 ENCOUNTER — Emergency Department: Payer: Medicare Other

## 2016-01-12 ENCOUNTER — Emergency Department
Admission: EM | Admit: 2016-01-12 | Discharge: 2016-01-12 | Disposition: A | Discharge status: Home / Self Care | Payer: Medicare Other | Attending: Emergency Medicine | Admitting: Emergency Medicine

## 2016-01-12 DIAGNOSIS — R1033 Periumbilical pain: Secondary | ICD-10-CM

## 2016-01-12 DIAGNOSIS — E119 Type 2 diabetes mellitus without complications: Secondary | ICD-10-CM | POA: Insufficient documentation

## 2016-01-12 DIAGNOSIS — Z87891 Personal history of nicotine dependence: Secondary | ICD-10-CM | POA: Insufficient documentation

## 2016-01-12 DIAGNOSIS — K439 Ventral hernia without obstruction or gangrene: Secondary | ICD-10-CM

## 2016-01-12 DIAGNOSIS — K469 Unspecified abdominal hernia without obstruction or gangrene: Secondary | ICD-10-CM | POA: Insufficient documentation

## 2016-01-12 DIAGNOSIS — R109 Unspecified abdominal pain: Secondary | ICD-10-CM | POA: Diagnosis present

## 2016-01-12 DIAGNOSIS — I10 Essential (primary) hypertension: Secondary | ICD-10-CM | POA: Diagnosis not present

## 2016-01-12 HISTORY — DX: Systemic involvement of connective tissue, unspecified: M35.9

## 2016-01-12 LAB — COMPREHENSIVE METABOLIC PANEL
ALK PHOS: 81 U/L (ref 38–126)
ALT: 24 U/L (ref 14–54)
AST: 36 U/L (ref 15–41)
Albumin: 3.5 g/dL (ref 3.5–5.0)
Anion gap: 9 (ref 5–15)
BILIRUBIN TOTAL: 0.6 mg/dL (ref 0.3–1.2)
BUN: 20 mg/dL (ref 6–20)
CALCIUM: 9.1 mg/dL (ref 8.9–10.3)
CO2: 24 mmol/L (ref 22–32)
CREATININE: 1 mg/dL (ref 0.44–1.00)
Chloride: 108 mmol/L (ref 101–111)
GFR, EST NON AFRICAN AMERICAN: 58 mL/min — AB (ref >60)
Glucose, Bld: 116 mg/dL — ABNORMAL HIGH (ref 65–99)
Potassium: 3.4 mmol/L — ABNORMAL LOW (ref 3.5–5.1)
Sodium: 141 mmol/L (ref 135–145)
TOTAL PROTEIN: 7.1 g/dL (ref 6.5–8.1)

## 2016-01-12 LAB — URINALYSIS COMPLETE WITH MICROSCOPIC (ARMC ONLY)
BILIRUBIN URINE: NEGATIVE
Bacteria, UA: NONE SEEN
GLUCOSE, UA: NEGATIVE mg/dL
Hgb urine dipstick: NEGATIVE
Ketones, ur: NEGATIVE mg/dL
LEUKOCYTES UA: NEGATIVE
Nitrite: NEGATIVE
PH: 5 (ref 5.0–8.0)
PROTEIN: NEGATIVE mg/dL
SPECIFIC GRAVITY, URINE: 1.024 (ref 1.005–1.030)

## 2016-01-12 LAB — CBC
HCT: 37.6 % (ref 35.0–47.0)
Hemoglobin: 12.7 g/dL (ref 12.0–16.0)
MCH: 28 pg (ref 26.0–34.0)
MCHC: 33.7 g/dL (ref 32.0–36.0)
MCV: 83.2 fL (ref 80.0–100.0)
PLATELETS: 239 10*3/uL (ref 150–440)
RBC: 4.52 MIL/uL (ref 3.80–5.20)
RDW: 16 % — ABNORMAL HIGH (ref 11.5–14.5)
WBC: 4.8 10*3/uL (ref 3.6–11.0)

## 2016-01-12 LAB — LIPASE, BLOOD: LIPASE: 15 U/L (ref 11–51)

## 2016-01-12 MED ORDER — IOHEXOL 300 MG/ML  SOLN
100.0000 mL | Freq: Once | INTRAMUSCULAR | Status: AC | PRN
  Administered 2016-01-12: 100 mL via INTRAVENOUS
  Filled 2016-01-12: qty 100

## 2016-01-12 MED ORDER — IOHEXOL 240 MG/ML SOLN
25.0000 mL | Freq: Once | INTRAMUSCULAR | Status: AC | PRN
  Administered 2016-01-12: 25 mL via ORAL
  Filled 2016-01-12: qty 25

## 2016-01-12 MED ORDER — TRAMADOL HCL 50 MG PO TABS: 50.0000 mg | ORAL_TABLET | Freq: Four times a day (QID) | ORAL | Status: DC | PRN

## 2016-01-12 NOTE — Discharge Instructions (Signed)
Hernia, Adult A hernia is the bulging of an organ or tissue through a weak spot in the muscles of the abdomen (abdominal wall). Hernias develop most often near the navel or groin. There are many kinds of hernias. Common kinds include:  Femoral hernia. This kind of hernia develops under the groin in the upper thigh area.  Inguinal hernia. This kind of hernia develops in the groin or scrotum.  Umbilical hernia. This kind of hernia develops near the navel.  Hiatal hernia. This kind of hernia causes part of the stomach to be pushed up into the chest.  Incisional hernia. This kind of hernia bulges through a scar from an abdominal surgery. CAUSES This condition may be caused by:  Heavy lifting.  Coughing over a long period of time.  Straining to have a bowel movement.  An incision made during an abdominal surgery.  A birth defect (congenital defect).  Excess weight or obesity.  Smoking.  Poor nutrition.  Cystic fibrosis.  Excess fluid in the abdomen.  Undescended testicles. SYMPTOMS Symptoms of a hernia include:  A lump on the abdomen. This is the first sign of a hernia. The lump may become more obvious with standing, straining, or coughing. It may get bigger over time if it is not treated or if the condition causing it is not treated.  Pain. A hernia is usually painless, but it may become painful over time if treatment is delayed. The pain is usually dull and may get worse with standing or lifting heavy objects. Sometimes a hernia gets tightly squeezed in the weak spot (strangulated) or stuck there (incarcerated) and causes additional symptoms. These symptoms may include:  Vomiting.  Nausea.  Constipation.  Irritability. DIAGNOSIS A hernia may be diagnosed with:  A physical exam. During the exam your health care provider may ask you to cough or to make a specific movement, because a hernia is usually more visible when you move.  Imaging tests. These can  include:  X-rays.  Ultrasound.  CT scan. TREATMENT A hernia that is small and painless may not need to be treated. A hernia that is large or painful may be treated with surgery. Inguinal hernias may be treated with surgery to prevent incarceration or strangulation. Strangulated hernias are always treated with surgery, because lack of blood to the trapped organ or tissue can cause it to die. Surgery to treat a hernia involves pushing the bulge back into place and repairing the weak part of the abdomen. HOME CARE INSTRUCTIONS  Avoid straining.  Do not lift anything heavier than 10 lb (4.5 kg).  Lift with your leg muscles, not your back muscles. This helps avoid strain.  When coughing, try to cough gently.  Prevent constipation. Constipation leads to straining with bowel movements, which can make a hernia worse or cause a hernia repair to break down. You can prevent constipation by:  Eating a high-fiber diet that includes plenty of fruits and vegetables.  Drinking enough fluids to keep your urine clear or pale yellow. Aim to drink 6-8 glasses of water per day.  Using a stool softener as directed by your health care provider.  Lose weight, if you are overweight.  Do not use any tobacco products, including cigarettes, chewing tobacco, or electronic cigarettes. If you need help quitting, ask your health care provider.  Keep all follow-up visits as directed by your health care provider. This is important. Your health care provider may need to monitor your condition. SEEK MEDICAL CARE IF:  You have   swelling, redness, and pain in the affected area.  Your bowel habits change. SEEK IMMEDIATE MEDICAL CARE IF:  You have a fever.  You have abdominal pain that is getting worse.  You feel nauseous or you vomit.  You cannot push the hernia back in place by gently pressing on it while you are lying down.  The hernia:  Changes in shape or size.  Is stuck outside the  abdomen.  Becomes discolored.  Feels hard or tender.   This information is not intended to replace advice given to you by your health care provider. Make sure you discuss any questions you have with your health care provider.   Document Released: 11/02/2005 Document Revised: 11/23/2014 Document Reviewed: 09/12/2014 Elsevier Interactive Patient Education 2016 Elsevier Inc.  

## 2016-01-12 NOTE — ED Notes (Signed)
NAD noted at time of D/C. Pt denies questions or concerns. Pt ambulatory to the lobby at this time.  

## 2016-01-12 NOTE — ED Provider Notes (Signed)
[facility] Emergency Department Provider Note     Time seen: ----------------------------------------- 4:17 PM on 01/12/2016 -----------------------------------------    I have reviewed the triage vital signs and the nursing notes.   HISTORY  Chief Complaint Abdominal Pain    HPI Chelsey [name] is a 65 y.o. female who presents the ER stating she has a knot in her stomach and she's been having abdominal pain this afternoon. She denies fevers, chills, vomiting or diarrhea. She denies any urinary symptoms, states she has a large knot protruding from her abdomen earlier. Abdomen is tender to touch, nothing makes her symptoms better or worse.   Past Medical History  Diagnosis Date  . Asthma   . Hypertension   . Arthritis   . Diabetes mellitus without complication ([place])     There are no active problems to display for this patient.   Past Surgical History  Procedure Laterality Date  . Hernia repair    . Brain surgery    . Cerebral aneurysm repair      Allergies Indomethacin and Percocet  Social History Social History  Substance Use Topics  . Smoking status: Former Research scientist (life sciences)  . Smokeless tobacco: Never Used     Comment: quit   . Alcohol Use: No    Review of Systems Constitutional: Negative for fever. Eyes: Negative for visual changes. ENT: Negative for sore throat. Cardiovascular: Negative for chest pain. Respiratory: Negative for shortness of breath. Gastrointestinal: Positive for abdominal pain, negative for vomiting and diarrhea Genitourinary: Negative for dysuria. Musculoskeletal: Negative for back pain. Skin: Negative for rash. Neurological: Negative for headaches, focal weakness or numbness.  10-point ROS otherwise negative.  ____________________________________________   PHYSICAL EXAM:  VITAL SIGNS: ED Triage Vitals  Enc Vitals Group     BP 01/12/16 1503 138/63 mmHg     Pulse Rate 01/12/16 1503 94     Resp 01/12/16  1503 20     Temp 01/12/16 1503 98.2 F (36.8 C)     Temp Source 01/12/16 1503 Oral     SpO2 --      Weight 01/12/16 1503 207 lb (93.895 kg)     Height 01/12/16 1503 5\' 5"  (1.651 m)     Head Cir --      Peak Flow --      Pain Score 01/12/16 1506 9     Pain Loc --      Pain Edu? --      Excl. in [place]? --     Constitutional: Alert and oriented. Well appearing and in no distress. Eyes: Conjunctivae are normal. PERRL. Normal extraocular movements. ENT   Head: Normocephalic and atraumatic.   Nose: No congestion/rhinnorhea.   Mouth/Throat: Mucous membranes are moist.   Neck: No stridor. Cardiovascular: Normal rate, regular rhythm. Normal and symmetric distal pulses are present in all extremities. No murmurs, rubs, or gallops. Respiratory: Normal respiratory effort without tachypnea nor retractions. Breath sounds are clear and equal bilaterally. No wheezes/rales/rhonchi. Gastrointestinal: Soft and nontender. No distention. No abdominal bruits. Hyperactive bowel sounds Musculoskeletal: Nontender with normal range of motion in all extremities. No joint effusions.  No lower extremity tenderness nor edema. Neurologic:  Normal speech and language. No gross focal neurologic deficits are appreciated. Speech is normal. No gait instability. Skin:  Skin is warm, dry and intact. No rash noted. Psychiatric: Mood and affect are normal. Speech and behavior are normal. Patient exhibits appropriate insight and judgment. ____________________________________________  ED COURSE:  Pertinent labs & imaging results that were  available during my care of the patient were reviewed by me and considered in my medical decision making (see chart for details). No clear etiology for her symptoms. We'll obtain CT imaging and basic labs. ____________________________________________    LABS (pertinent positives/negatives)  Labs Reviewed  COMPREHENSIVE METABOLIC PANEL - Abnormal; Notable for the following:     Potassium 3.4 (*)    Glucose, Bld 116 (*)    GFR calc non Af Amer 58 (*)    All other components within normal limits  CBC - Abnormal; Notable for the following:    RDW 16.0 (*)    All other components within normal limits  URINALYSIS COMPLETEWITH MICROSCOPIC ([facility] ONLY) - Abnormal; Notable for the following:    Color, Urine YELLOW (*)    APPearance CLEAR (*)    Squamous Epithelial / LPF 0-5 (*)    All other components within normal limits  LIPASE, BLOOD    RADIOLOGY Images were viewed by me   CT of the abdomen and pelvis with contrast IMPRESSION: 1. There 2 small hernias in the vicinity of the umbilicus. These contain omental adipose tissue. There is some stranding in the adjacent intra abdominal omentum suggesting low-grade inflammation. 2. 1.3 cm complex lesion of the right mid upper kidney laterally -complex cyst versus mass. No prior cross-sectional imaging through this area available. Renal protocol MRI a with an without contrast recommended for further workup. 3. Pelvic floor laxity with a portion of the rectum herniating through the right pelvic floor into the ischiorectal fossa. No findings of strangulation or obstruction. 4. Hazy and faint interstitial accentuation at the lung bases, possibly a manifestation of collagen vascular disease. 5. Small type 1 hiatal hernia. 6. Impingement at L4-5 due to subluxation, spondylosis, and degenerative disc disease. 7. Aortoiliac atherosclerotic vascular disease. ____________________________________________  FINAL ASSESSMENT AND PLAN  Abdominal pain, hernia  Plan: Patient with labs and imaging as dictated above. Patient likely with ventral hernias that are symptomatic at times. I will refer her to general surgery for follow-up for this as well as the other findings on CT above. She is stable for outpatient follow-up.   Earleen Newport, MD   Earleen Newport, MD 01/12/16 [phone]

## 2016-01-12 NOTE — ED Notes (Addendum)
Pt states i have a knot in my stomach.  Pt reports abd pain since this afternoon.  No n/v/d.   Denies back pain.  No urinary sx.   Pt alert   Speech clear.

## 2016-01-24 ENCOUNTER — Ambulatory Visit: Payer: Self-pay | Admitting: Surgery

## 2016-01-27 ENCOUNTER — Encounter: Payer: Self-pay | Admitting: Surgery

## 2016-01-27 DIAGNOSIS — E1122 Type 2 diabetes mellitus with diabetic chronic kidney disease: Secondary | ICD-10-CM | POA: Insufficient documentation

## 2016-01-27 DIAGNOSIS — E119 Type 2 diabetes mellitus without complications: Secondary | ICD-10-CM | POA: Insufficient documentation

## 2016-01-27 DIAGNOSIS — E1100 Type 2 diabetes mellitus with hyperosmolarity without nonketotic hyperglycemic-hyperosmolar coma (NKHHC): Secondary | ICD-10-CM | POA: Insufficient documentation

## 2016-01-28 ENCOUNTER — Ambulatory Visit (INDEPENDENT_AMBULATORY_CARE_PROVIDER_SITE_OTHER): Payer: Medicare Other | Admitting: Surgery

## 2016-01-28 ENCOUNTER — Encounter: Payer: Self-pay | Admitting: Surgery

## 2016-01-28 VITALS — BP 144/80 | HR 87 | Temp 97.4°F | Ht 64.0 in | Wt 210.0 lb

## 2016-01-28 DIAGNOSIS — K432 Incisional hernia without obstruction or gangrene: Secondary | ICD-10-CM | POA: Diagnosis not present

## 2016-01-28 NOTE — Patient Instructions (Signed)
Hernia, Adult A hernia is the bulging of an organ or tissue through a weak spot in the muscles of the abdomen (abdominal wall). Hernias develop most often near the navel or groin. There are many kinds of hernias. Common kinds include:  Femoral hernia. This kind of hernia develops under the groin in the upper thigh area.  Inguinal hernia. This kind of hernia develops in the groin or scrotum.  Umbilical hernia. This kind of hernia develops near the navel.  Hiatal hernia. This kind of hernia causes part of the stomach to be pushed up into the chest.  Incisional hernia. This kind of hernia bulges through a scar from an abdominal surgery. CAUSES This condition may be caused by:  Heavy lifting.  Coughing over a long period of time.  Straining to have a bowel movement.  An incision made during an abdominal surgery.  A birth defect (congenital defect).  Excess weight or obesity.  Smoking.  Poor nutrition.  Cystic fibrosis.  Excess fluid in the abdomen.  Undescended testicles. SYMPTOMS Symptoms of a hernia include:  A lump on the abdomen. This is the first sign of a hernia. The lump may become more obvious with standing, straining, or coughing. It may get bigger over time if it is not treated or if the condition causing it is not treated.  Pain. A hernia is usually painless, but it may become painful over time if treatment is delayed. The pain is usually dull and may get worse with standing or lifting heavy objects. Sometimes a hernia gets tightly squeezed in the weak spot (strangulated) or stuck there (incarcerated) and causes additional symptoms. These symptoms may include:  Vomiting.  Nausea.  Constipation.  Irritability. DIAGNOSIS A hernia may be diagnosed with:  A physical exam. During the exam your health care provider may ask you to cough or to make a specific movement, because a hernia is usually more visible when you move.  Imaging tests. These can  include:  X-rays.  Ultrasound.  CT scan. TREATMENT A hernia that is small and painless may not need to be treated. A hernia that is large or painful may be treated with surgery. Inguinal hernias may be treated with surgery to prevent incarceration or strangulation. Strangulated hernias are always treated with surgery, because lack of blood to the trapped organ or tissue can cause it to die. Surgery to treat a hernia involves pushing the bulge back into place and repairing the weak part of the abdomen. HOME CARE INSTRUCTIONS  Avoid straining.  Do not lift anything heavier than 10 lb (4.5 kg).  Lift with your leg muscles, not your back muscles. This helps avoid strain.  When coughing, try to cough gently.  Prevent constipation. Constipation leads to straining with bowel movements, which can make a hernia worse or cause a hernia repair to break down. You can prevent constipation by:  Eating a high-fiber diet that includes plenty of fruits and vegetables.  Drinking enough fluids to keep your urine clear or pale yellow. Aim to drink 6-8 glasses of water per day.  Using a stool softener as directed by your health care provider.  Lose weight, if you are overweight.  Do not use any tobacco products, including cigarettes, chewing tobacco, or electronic cigarettes. If you need help quitting, ask your health care provider.  Keep all follow-up visits as directed by your health care provider. This is important. Your health care provider may need to monitor your condition. SEEK MEDICAL CARE IF:  You have   swelling, redness, and pain in the affected area.  Your bowel habits change. SEEK IMMEDIATE MEDICAL CARE IF:  You have a fever.  You have abdominal pain that is getting worse.  You feel nauseous or you vomit.  You cannot push the hernia back in place by gently pressing on it while you are lying down.  The hernia:  Changes in shape or size.  Is stuck outside the  abdomen.  Becomes discolored.  Feels hard or tender.   This information is not intended to replace advice given to you by your health care provider. Make sure you discuss any questions you have with your health care provider.   Document Released: 11/02/2005 Document Revised: 11/23/2014 Document Reviewed: 09/12/2014 Elsevier Interactive Patient Education 2016 Elsevier Inc.  

## 2016-01-28 NOTE — Progress Notes (Signed)
Patient ID: Chelsey [name], female   DOB: 28-May-1951, 65 y.o.   MRN: [id number]  History of Present Illness Chelsey [name] is a 65 y.o. female   Past Medical History Chelsey [name] is a 65 year old female with multiple comorbidities including diabetes, peripheral vascular disease, hypertension, asthma and a history of brain aneurysm. She percents with a symptomatic ventral hernia. She reports that she initially had a hernia surgery close to 17 years ago that she does not remember if there was a mesh placement or not and she is not completely sure about the surgery alleviated and the facility. She complains of intermittent dull to sharp pains in the periumbilical area moderate in intensity no evidence of incarceration or strangulation. Her pain exacerbates when she moves and there is no specific alleviating factors. She recently came to the emergency room where a CT scan of the abdomen was performed showing evidence of a recurrent ventral hernia with 2 separate defects. No evidence of incarceration or strangulation. I have personally reviewed the images and there is approximately 2 separate defects one measuring approximately 3 cm and the other one measured about 2 cm.  Past Medical History  Diagnosis Date  . Asthma   . Hypertension   . Arthritis   . Diabetes mellitus without complication ([place])   . Collagen vascular disease ([place])   . Aneurysm [facility])      Past Surgical History  Procedure Laterality Date  . Brain surgery    . Cerebral aneurysm repair    . Hernia repair  2000    ventral    Allergies  Allergen Reactions  . Indomethacin Other (See Comments)    BURN HOLE IN STOMACH  . Percocet [Oxycodone-Acetaminophen] Other (See Comments)    HALLUCINATIONS     Current Outpatient Prescriptions  Medication Sig Dispense Refill  . allopurinol (ZYLOPRIM) 100 MG tablet Take 200 mg by mouth daily.     Marland Kitchen amLODipine (NORVASC) 10 MG tablet Take 10 mg by mouth daily.     Marland Kitchen aspirin EC 81 MG tablet  Take 81 mg by mouth daily.    . Blood Glucose Calibration (TAI DOC CONTROL) NORMAL SOLN     . Blood Glucose Monitoring Suppl (ACCU-CHEK AVIVA PLUS) W/DEVICE KIT Use as directed.    Marland Kitchen COLCRYS 0.6 MG tablet Take 0.6 mg by mouth 2 (two) times daily as needed (for gout flares.).     Marland Kitchen doxycycline (ADOXA) 100 MG tablet Take 100 mg by mouth.    . ferrous sulfate 325 (65 FE) MG tablet Take 325 mg by mouth 2 (two) times daily.    . fluticasone (FLONASE) 50 MCG/ACT nasal spray Place 2 sprays into both nostrils daily as needed. For rhinitis and allergies.    . Fluticasone-Salmeterol (ADVAIR DISKUS) 250-50 MCG/DOSE AEPB Take 2 puffs by mouth.    . furosemide (LASIX) 40 MG tablet Take 40 mg by mouth daily.     Marland Kitchen gabapentin (NEURONTIN) 300 MG capsule Take 300 mg by mouth 3 (three) times daily.    Marland Kitchen glucose blood test strip 1 each daily. Use 1 test strip daily.    . insulin lispro (HUMALOG KWIKPEN) 100 UNIT/ML KiwkPen Inject 20 Units into the skin 3 (three) times daily before meals.    Marland Kitchen LANCETS ULTRA THIN MISC Apply 1 each topically daily.     Marland Kitchen LANTUS 100 UNIT/ML injection Inject 35 Units into the skin 2 (two) times daily.     Marland Kitchen levothyroxine (SYNTHROID, LEVOTHROID) 88 MCG tablet Take 88  mcg by mouth daily before breakfast.     . lisinopril (PRINIVIL,ZESTRIL) 40 MG tablet Take 40 mg by mouth daily.    . metFORMIN (GLUCOPHAGE) 1000 MG tablet 1,000 mg 2 (two) times daily with a meal.     . omeprazole (PRILOSEC) 20 MG capsule Take 20 mg by mouth daily.     . simvastatin (ZOCOR) 20 MG tablet Take 20 mg by mouth every morning.     . traMADol (ULTRAM) 50 MG tablet Take 1 tablet (50 mg total) by mouth every 6 (six) hours as needed. 20 tablet 0  . albuterol (PROAIR HFA) 108 (90 BASE) MCG/ACT inhaler Inhale 2 puffs into the lungs every 6 (six) hours as needed. For wheezing or shortness of breath.     No current facility-administered medications for this visit.    Family History Family History  Problem Relation  Age of Onset  . Heart disease Mother   . Hypertension Mother   . Diabetes Mother   . Diabetes Father   . Heart disease Father   . Hypertension Father     Social History Social History  Substance Use Topics  . Smoking status: Former Smoker    Quit date: 01/27/2006  . Smokeless tobacco: Never Used     Comment: quit   . Alcohol Use: No       ROS Temporal review of system was performed and is otherwise negative other than what is stated in the history of present illness  Physical Exam Blood pressure 144/80, pulse 87, temperature 97.4 F (36.3 C), temperature source Oral, height 5' 4" (1.626 m), weight 95.255 kg (210 lb), SpO2 94 %.  CONSTITUTIONAL: No acute distress,  EYES: Pupils equal, round, and reactive to light, Sclera non-icteric. EARS, NOSE, MOUTH AND THROAT: The oropharynx is clear. Oral mucosa is pink and moist. Hearing is intact to voice.  NECK: Trachea is midline, and there is no jugular venous distension. Thyroid is without palpable abnormalities. LYMPH NODES:  Lymph nodes in the neck are not enlarged. RESPIRATORY:  Lungs are clear, and breath sounds are equal bilaterally. Normal respiratory effort without pathologic use of accessory muscles. CARDIOVASCULAR: Heart is regular without murmurs, gallops, or rubs. GI: The abdomen is  soft,  nondistended. There is ventral hernia in the periumbilical area and a previous midline laparotomy scar. The hernia is reducible but is mildly tender to palpation. There were no palpable masses. There was no hepatosplenomegaly. There were normal bowel sounds. MUSCULOSKELETAL:  Normal muscle strength and tone in all four extremities.    SKIN: Skin turgor is normal. There are no pathologic skin lesions.  NEUROLOGIC:  Motor and sensation is grossly normal.  Cranial nerves are grossly intact. PSYCH:  Alert and oriented to person, place and time. Affect is normal.  Data Reviewed I have personally reviewed the patient's imaging and medical  records.    Assessment/Plan  Recurrent symptomatic ventral hernia. Scars with the patient about her disease process and my recommendation for repair. Discussed about the procedure in detail including laparoscopic versus open approach and the need for mesh placement.I have explained the procedure, risks, and aftercare of  hernia repair to Ceniyah L Sorbo.   Risks include but are not limited to bleeding, infection, wound problems, anesthesia, recurrence, bladder or intestine injury, urinary retention,  chronic pain, mesh problems.  She  seems to understand and agrees to proceed.  Questions were answered to her stated satisfaction. Extensive counseling provided . Given all her comorbidities will make sure that she   will have a preanesthetic consultation.  Dreydon Cardenas, MD FACS  Jaman Aro F Raechel Marcos 01/28/2016, 9:45 AM     

## 2016-01-29 ENCOUNTER — Telehealth: Payer: Self-pay | Admitting: Surgery

## 2016-01-29 NOTE — Telephone Encounter (Signed)
Pt advised of pre op date/time and sx date. Sx: 02/18/16 with Dr Pabon--Laparoscopic incisional/ventral hernia repair.  Pre op: 02/11/16 @ 8:15am--Office.   Patient made aware to call [phone], between 1-3:00pm the day before surgery, to find out what time to arrive.

## 2016-02-11 ENCOUNTER — Ambulatory Visit: Payer: Medicare Other | Admitting: Sports Medicine

## 2016-02-11 ENCOUNTER — Encounter
Admission: RE | Admit: 2016-02-11 | Discharge: 2016-02-11 | Disposition: A | Discharge status: Home / Self Care | Payer: Medicare Other | Source: Ambulatory Visit | Attending: Surgery | Admitting: Surgery

## 2016-02-11 DIAGNOSIS — Z01812 Encounter for preprocedural laboratory examination: Secondary | ICD-10-CM | POA: Diagnosis present

## 2016-02-11 DIAGNOSIS — I10 Essential (primary) hypertension: Secondary | ICD-10-CM | POA: Insufficient documentation

## 2016-02-11 DIAGNOSIS — Z01810 Encounter for preprocedural cardiovascular examination: Secondary | ICD-10-CM | POA: Diagnosis present

## 2016-02-11 HISTORY — DX: Gout, unspecified: M10.9

## 2016-02-11 HISTORY — DX: Anemia, unspecified: D64.9

## 2016-02-11 HISTORY — DX: Gastro-esophageal reflux disease without esophagitis: K21.9

## 2016-02-11 HISTORY — DX: Hypothyroidism, unspecified: E03.9

## 2016-02-11 HISTORY — DX: Sarcoidosis of lung: D86.0

## 2016-02-11 HISTORY — DX: Peripheral vascular disease, unspecified: I73.9

## 2016-02-11 HISTORY — DX: Sleep apnea, unspecified: G47.30

## 2016-02-11 LAB — BASIC METABOLIC PANEL
Anion gap: 7 (ref 5–15)
BUN: 20 mg/dL (ref 6–20)
CALCIUM: 8.9 mg/dL (ref 8.9–10.3)
CHLORIDE: 108 mmol/L (ref 101–111)
CO2: 24 mmol/L (ref 22–32)
CREATININE: 0.76 mg/dL (ref 0.44–1.00)
GFR calc non Af Amer: >60 mL/min (ref >60)
Glucose, Bld: 141 mg/dL — ABNORMAL HIGH (ref 65–99)
Potassium: 3.7 mmol/L (ref 3.5–5.1)
SODIUM: 139 mmol/L (ref 135–145)

## 2016-02-11 NOTE — Patient Instructions (Signed)
  Your procedure is scheduled on:02/18/16 Report to Day Surgery. To find out your arrival time please call [phone] between 1PM - 3PM on  02/17/16.  Remember: Instructions that are not followed completely may result in serious medical risk, up to and including death, or upon the discretion of your surgeon and anesthesiologist your surgery may need to be rescheduled.    _x___ 1. Do not eat food or drink liquids after midnight. No gum chewing or hard candies.     __x__ 2. No Alcohol for 24 hours before or after surgery.   ____ 3. Bring all medications with you on the day of surgery if instructed.    __x__ 4. Notify your doctor if there is any change in your medical condition     (cold, fever, infections).     Do not wear jewelry, make-up, hairpins, clips or nail polish.  Do not wear lotions, powders, or perfumes. You may wear deodorant.  Do not shave 48 hours prior to surgery. Men may shave face and neck.  Do not bring valuables to the hospital.    [facility] is not responsible for any belongings or valuables.               Contacts, dentures or bridgework may not be worn into surgery.  Leave your suitcase in the car. After surgery it may be brought to your room.  For patients admitted to the hospital, discharge time is determined by your                treatment team.   Patients discharged the day of surgery will not be allowed to drive home.   Please read over the following fact sheets that you were given:   Coughing and Deep Breathing, Surgical Site Infection Prevention, Anesthesia Post-op Instructions and Care and Recovery After Surgery   ____ Take these medicines the morning of surgery with A SIP OF WATER:    1. levothyroxin  2. amlodipine  3. omeprazole  4.allopurinol  5.colchicine  6.lisinopril              7. Gabapentin  ____ Fleet Enema (as directed)   __x__ Use CHG Soap as directed  __x__ Use inhalers on the day of surgery  __x__ Stop metformin 2 days prior to  surgery last dose on 4/1   __x__ Take 1/2 of usual insulin dose the night before surgery and none on the morning of surgery.   ____ Stop aspirin on  today  __x__ Stop Anti-inflammatories on tylenol only   ____ Stop supplements until after surgery.    ____ Bring C-Pap to the hospital.

## 2016-02-12 NOTE — Pre-Procedure Instructions (Signed)
ekg compared with previous years and pacs  Noted in past

## 2016-02-18 ENCOUNTER — Inpatient Hospital Stay: Payer: Medicare Other | Admitting: Anesthesiology

## 2016-02-18 ENCOUNTER — Telehealth: Payer: Self-pay | Admitting: Surgery

## 2016-02-18 ENCOUNTER — Ambulatory Visit
Admission: RE | Admit: 2016-02-18 | Discharge: 2016-02-18 | Disposition: A | Discharge status: Home / Self Care | Payer: Medicare Other | Source: Ambulatory Visit | Attending: Surgery | Admitting: Surgery

## 2016-02-18 ENCOUNTER — Encounter
Admission: RE | Disposition: A | Discharge status: Home / Self Care | Payer: Self-pay | Source: Ambulatory Visit | Attending: Surgery

## 2016-02-18 DIAGNOSIS — K432 Incisional hernia without obstruction or gangrene: Secondary | ICD-10-CM | POA: Diagnosis not present

## 2016-02-18 LAB — GLUCOSE, CAPILLARY: GLUCOSE-CAPILLARY: 304 mg/dL — AB (ref 65–99)

## 2016-02-18 SURGERY — REPAIR, HERNIA, INCISIONAL, LAPAROSCOPIC
Anesthesia: General

## 2016-02-18 MED ORDER — CHLORHEXIDINE GLUCONATE 4 % EX LIQD: 1.0000 "application " | Freq: Once | CUTANEOUS | Status: DC

## 2016-02-18 MED ORDER — SODIUM CHLORIDE 0.9 % IV SOLN
INTRAVENOUS | Status: DC
  Administered 2016-02-18: 07:00:00 via INTRAVENOUS

## 2016-02-18 MED ORDER — CEFAZOLIN SODIUM-DEXTROSE 2-4 GM/100ML-% IV SOLN
INTRAVENOUS | Status: AC
  Filled 2016-02-18: qty 100

## 2016-02-18 MED ORDER — HEPARIN SODIUM (PORCINE) 5000 UNIT/ML IJ SOLN
5000.0000 [IU] | Freq: Once | INTRAMUSCULAR | Status: AC
  Administered 2016-02-18: 5000 [IU] via SUBCUTANEOUS

## 2016-02-18 MED ORDER — CEFAZOLIN SODIUM-DEXTROSE 2-4 GM/100ML-% IV SOLN
2.0000 g | INTRAVENOUS | Status: DC
Start: 2016-02-18 — End: 2016-02-18

## 2016-02-18 MED ORDER — HEPARIN SODIUM (PORCINE) 5000 UNIT/ML IJ SOLN
INTRAMUSCULAR | Status: AC
  Administered 2016-02-18: 5000 [IU] via SUBCUTANEOUS
  Filled 2016-02-18: qty 1

## 2016-02-18 MED ORDER — ACETAMINOPHEN 10 MG/ML IV SOLN
INTRAVENOUS | Status: AC
  Filled 2016-02-18: qty 100

## 2016-02-18 SURGICAL SUPPLY — 34 items
APPLIER CLIP 11 MED OPEN (CLIP)
APPLIER CLIP 13 LRG OPEN (CLIP)
APR CLP LRG 13 20 CLIP (CLIP)
APR CLP MED 11 20 MLT OPN (CLIP)
BLADE CLIPPER SURG (BLADE) IMPLANT
CANISTER SUCT 3000ML (MISCELLANEOUS) ×3 IMPLANT
CHLORAPREP W/TINT 26ML (MISCELLANEOUS) IMPLANT
CLIP APPLIE 11 MED OPEN (CLIP) IMPLANT
CLIP APPLIE 13 LRG OPEN (CLIP) IMPLANT
DRAPE LAPAROTOMY 100X77 ABD (DRAPES) IMPLANT
DRSG TELFA 3X8 NADH (GAUZE/BANDAGES/DRESSINGS) IMPLANT
ELECT CAUTERY BLADE 6.4 (BLADE) IMPLANT
ELECT REM PT RETURN 9FT ADLT (ELECTROSURGICAL)
ELECTRODE REM PT RTRN 9FT ADLT (ELECTROSURGICAL) IMPLANT
GAUZE SPONGE 4X4 12PLY STRL (GAUZE/BANDAGES/DRESSINGS) IMPLANT
GLOVE BIO SURGEON STRL SZ7 (GLOVE) IMPLANT
GOWN STRL REUS W/ TWL LRG LVL3 (GOWN DISPOSABLE) IMPLANT
GOWN STRL REUS W/TWL LRG LVL3 (GOWN DISPOSABLE)
NEEDLE HYPO 25X1 1.5 SAFETY (NEEDLE) IMPLANT
NEEDLE SPNL 22GX1.5 QUINCKE BK (NEEDLE) IMPLANT
PACK BASIN MINOR ARMC (MISCELLANEOUS) IMPLANT
PACK LAP CHOLECYSTECTOMY (MISCELLANEOUS) IMPLANT
SLEEVE ADV FIXATION 5X100MM (TROCAR) IMPLANT
STAPLER SKIN PROX 35W (STAPLE) IMPLANT
SUT ETHIBOND 0 MO6 C/R (SUTURE) IMPLANT
SUT ETHIBOND NAB MO 7 #0 18IN (SUTURE) IMPLANT
SUT VIC AB 2-0 SH 27 (SUTURE)
SUT VIC AB 2-0 SH 27XBRD (SUTURE) IMPLANT
SYR 20CC LL (SYRINGE) IMPLANT
TAPE MICROFOAM 4IN (TAPE) IMPLANT
TROCAR XCEL BLUNT TIP 100MML (ENDOMECHANICALS) IMPLANT
TROCAR Z-THREAD OPTICAL 5X100M (TROCAR) IMPLANT
TUBING INSUFFLATOR HEATED (MISCELLANEOUS) IMPLANT
WATER STERILE IRR 1000ML POUR (IV SOLUTION) IMPLANT

## 2016-02-18 NOTE — Interval H&P Note (Signed)
History and Physical Interval Note:  02/18/2016 7:01 AM  Chelsey [name]  has presented today for surgery, with the diagnosis of recurrent ventral hernia  The various methods of treatment have been discussed with the patient and family. After consideration of risks, benefits and other options for treatment, the patient has consented to  Procedure(s): LAPAROSCOPIC INCISIONAL HERNIA (N/A) as a surgical intervention .  The patient's history has been reviewed, patient examined, no change in status, stable for surgery.  I have reviewed the patient's chart and labs.  Questions were answered to the patient's satisfaction.     [place]

## 2016-02-18 NOTE — OR Nursing (Signed)
Dr Dahlia Byes cancelled surgery due to patient been on Steroid and blood sugar is elevated as well.

## 2016-02-18 NOTE — Interval H&P Note (Signed)
History and Physical Interval Note:  02/18/2016 7:25 AM  Chelsey [name]  has presented today for surgery, with the diagnosis of recurrent ventral hernia . The patient's history has been reviewed, patient examined, Pt recently placed on steroids for her sarcoidosis and BS is greater than 300. D/W her in detail that both of these new factors will increase the chances of wound complication and infection as well as recurrence.  We will post pone the surgery until she is medically optimized and she is off the steroids. She understands. We will make arrangements for her to see her PCP again .  I will see her in a few weeks and reassess her for the elective ventral hernia repair.  [place]

## 2016-02-18 NOTE — Telephone Encounter (Signed)
Patient's surgery was canceled today due to her taking the wrong medication (prednizone). Please call patient to review medications and let me know when we can reschedule her surgery.

## 2016-02-18 NOTE — H&P (View-Only) (Signed)
Patient ID: Chelsey [name], female   DOB: 28-May-1951, 65 y.o.   MRN: [id number]  History of Present Illness Chelsey [name] is a 65 y.o. female   Past Medical History Chelsey [name] is a 65 year old female with multiple comorbidities including diabetes, peripheral vascular disease, hypertension, asthma and a history of brain aneurysm. She percents with a symptomatic ventral hernia. She reports that she initially had a hernia surgery close to 17 years ago that she does not remember if there was a mesh placement or not and she is not completely sure about the surgery alleviated and the facility. She complains of intermittent dull to sharp pains in the periumbilical area moderate in intensity no evidence of incarceration or strangulation. Her pain exacerbates when she moves and there is no specific alleviating factors. She recently came to the emergency room where a CT scan of the abdomen was performed showing evidence of a recurrent ventral hernia with 2 separate defects. No evidence of incarceration or strangulation. I have personally reviewed the images and there is approximately 2 separate defects one measuring approximately 3 cm and the other one measured about 2 cm.  Past Medical History  Diagnosis Date  . Asthma   . Hypertension   . Arthritis   . Diabetes mellitus without complication (Chelsey [name])   . Collagen vascular disease ([place])   . Aneurysm [facility])      Past Surgical History  Procedure Laterality Date  . Brain surgery    . Cerebral aneurysm repair    . Hernia repair  2000    ventral    Allergies  Allergen Reactions  . Indomethacin Other (See Comments)    BURN HOLE IN STOMACH  . Percocet [Oxycodone-Acetaminophen] Other (See Comments)    HALLUCINATIONS     Current Outpatient Prescriptions  Medication Sig Dispense Refill  . allopurinol (ZYLOPRIM) 100 MG tablet Take 200 mg by mouth daily.     Marland Kitchen amLODipine (NORVASC) 10 MG tablet Take 10 mg by mouth daily.     Marland Kitchen aspirin EC 81 MG tablet  Take 81 mg by mouth daily.    . Blood Glucose Calibration (TAI DOC CONTROL) NORMAL SOLN     . Blood Glucose Monitoring Suppl (ACCU-CHEK AVIVA PLUS) W/DEVICE KIT Use as directed.    Marland Kitchen COLCRYS 0.6 MG tablet Take 0.6 mg by mouth 2 (two) times daily as needed (for gout flares.).     Marland Kitchen doxycycline (ADOXA) 100 MG tablet Take 100 mg by mouth.    . ferrous sulfate 325 (65 FE) MG tablet Take 325 mg by mouth 2 (two) times daily.    . fluticasone (FLONASE) 50 MCG/ACT nasal spray Place 2 sprays into both nostrils daily as needed. For rhinitis and allergies.    . Fluticasone-Salmeterol (ADVAIR DISKUS) 250-50 MCG/DOSE AEPB Take 2 puffs by mouth.    . furosemide (LASIX) 40 MG tablet Take 40 mg by mouth daily.     Marland Kitchen gabapentin (NEURONTIN) 300 MG capsule Take 300 mg by mouth 3 (three) times daily.    Marland Kitchen glucose blood test strip 1 each daily. Use 1 test strip daily.    . insulin lispro (HUMALOG KWIKPEN) 100 UNIT/ML KiwkPen Inject 20 Units into the skin 3 (three) times daily before meals.    Marland Kitchen LANCETS ULTRA THIN MISC Apply 1 each topically daily.     Marland Kitchen LANTUS 100 UNIT/ML injection Inject 35 Units into the skin 2 (two) times daily.     Marland Kitchen levothyroxine (SYNTHROID, LEVOTHROID) 88 MCG tablet Take 88  mcg by mouth daily before breakfast.     . lisinopril (PRINIVIL,ZESTRIL) 40 MG tablet Take 40 mg by mouth daily.    . metFORMIN (GLUCOPHAGE) 1000 MG tablet 1,000 mg 2 (two) times daily with a meal.     . omeprazole (PRILOSEC) 20 MG capsule Take 20 mg by mouth daily.     . simvastatin (ZOCOR) 20 MG tablet Take 20 mg by mouth every morning.     . traMADol (ULTRAM) 50 MG tablet Take 1 tablet (50 mg total) by mouth every 6 (six) hours as needed. 20 tablet 0  . albuterol (PROAIR HFA) 108 (90 BASE) MCG/ACT inhaler Inhale 2 puffs into the lungs every 6 (six) hours as needed. For wheezing or shortness of breath.     No current facility-administered medications for this visit.    Family History Family History  Problem Relation  Age of Onset  . Heart disease Mother   . Hypertension Mother   . Diabetes Mother   . Diabetes Father   . Heart disease Father   . Hypertension Father     Social History Social History  Substance Use Topics  . Smoking status: Former Smoker    Quit date: 01/27/2006  . Smokeless tobacco: Never Used     Comment: quit   . Alcohol Use: No       ROS Temporal review of system was performed and is otherwise negative other than what is stated in the history of present illness  Physical Exam Blood pressure 144/80, pulse 87, temperature 97.4 F (36.3 C), temperature source Oral, height 5' 4" (1.626 m), weight 95.255 kg (210 lb), SpO2 94 %.  CONSTITUTIONAL: No acute distress,  EYES: Pupils equal, round, and reactive to light, Sclera non-icteric. EARS, NOSE, MOUTH AND THROAT: The oropharynx is clear. Oral mucosa is pink and moist. Hearing is intact to voice.  NECK: Trachea is midline, and there is no jugular venous distension. Thyroid is without palpable abnormalities. LYMPH NODES:  Lymph nodes in the neck are not enlarged. RESPIRATORY:  Lungs are clear, and breath sounds are equal bilaterally. Normal respiratory effort without pathologic use of accessory muscles. CARDIOVASCULAR: Heart is regular without murmurs, gallops, or rubs. GI: The abdomen is  soft,  nondistended. There is ventral hernia in the periumbilical area and a previous midline laparotomy scar. The hernia is reducible but is mildly tender to palpation. There were no palpable masses. There was no hepatosplenomegaly. There were normal bowel sounds. MUSCULOSKELETAL:  Normal muscle strength and tone in all four extremities.    SKIN: Skin turgor is normal. There are no pathologic skin lesions.  NEUROLOGIC:  Motor and sensation is grossly normal.  Cranial nerves are grossly intact. PSYCH:  Alert and oriented to person, place and time. Affect is normal.  Data Reviewed I have personally reviewed the patient's imaging and medical  records.    Assessment/Plan  Recurrent symptomatic ventral hernia. Scars with the patient about her disease process and my recommendation for repair. Discussed about the procedure in detail including laparoscopic versus open approach and the need for mesh placement.I have explained the procedure, risks, and aftercare of  hernia repair to Chelsey [name].   Risks include but are not limited to bleeding, infection, wound problems, anesthesia, recurrence, bladder or intestine injury, urinary retention,  chronic pain, mesh problems.  She  seems to understand and agrees to proceed.  Questions were answered to her stated satisfaction. Extensive counseling provided . Given all her comorbidities will make sure that she  will have a preanesthetic consultation.  Diego pabon, MD Chelsey [name] 01/28/2016, 9:45 AM

## 2016-02-18 NOTE — Telephone Encounter (Addendum)
Spoke with Dr. Dahlia Byes regarding this patient. She will need to see PCP as soon as possible to get her diabetes under control and to see if she can be taken off of her steroid.  Her PCP is Cristin Colford [phone].

## 2016-02-18 NOTE — OR Nursing (Signed)
Dr Rosey Bath and Dr Dahlia Byes aware of blood sugar of 304. No new orders at this time.

## 2016-02-18 NOTE — Anesthesia Preprocedure Evaluation (Deleted)
Anesthesia Evaluation  Patient identified by MRN, date of birth, ID band Patient awake    Reviewed: Allergy & Precautions, H&P , NPO status , Patient's Chart, lab work & pertinent test results, reviewed documented beta blocker date and time   History of Anesthesia Complications Negative for: history of anesthetic complications  Airway Mallampati: IV  TM Distance: >3 FB Neck ROM: full    Dental no notable dental hx. (+) Edentulous Upper, Edentulous Lower   Pulmonary neg shortness of breath, asthma , sleep apnea and Oxygen sleep apnea , neg COPD, neg recent URI, former smoker,    Pulmonary exam normal breath sounds clear to auscultation       Cardiovascular Exercise Tolerance: Good hypertension, (-) angina+ Peripheral Vascular Disease  (-) CAD, (-) Past MI, (-) Cardiac Stents and (-) CABG Normal cardiovascular exam(-) dysrhythmias (-) Valvular Problems/Murmurs Rhythm:regular Rate:Normal     Neuro/Psych negative neurological ROS  negative psych ROS   GI/Hepatic Neg liver ROS, GERD  ,  Endo/Other  diabetesHypothyroidism Morbid obesity  Renal/GU negative Renal ROS  negative genitourinary   Musculoskeletal   Abdominal   Peds  Hematology  (+) Blood dyscrasia, anemia ,   Anesthesia Other Findings Past Medical History:   Asthma                                                       Hypertension                                                 Arthritis                                                    Diabetes mellitus without complication (HCC)                 Collagen vascular disease ([place])                              Aneurysm ([place])                                               Gout                                                         Sarcoidosis of lung ([place])                                    Sleep apnea  Hypothyroidism                                              GERD (gastroesophageal reflux disease)                       Anemia                                                       Peripheral vascular disease (HCC)                            Reproductive/Obstetrics negative OB ROS                             Anesthesia Physical Anesthesia Plan  ASA: III  Anesthesia Plan: General   Post-op Pain Management:    Induction:   Airway Management Planned:   Additional Equipment:   Intra-op Plan:   Post-operative Plan:   Informed Consent: I have reviewed the patients History and Physical, chart, labs and discussed the procedure including the risks, benefits and alternatives for the proposed anesthesia with the patient or authorized representative who has indicated his/her understanding and acceptance.   Dental Advisory Given  Plan Discussed with: Anesthesiologist, CRNA and Surgeon  Anesthesia Plan Comments:         Anesthesia Quick Evaluation

## 2016-02-18 NOTE — Telephone Encounter (Signed)
Call made to Dr. Lauralee Evener office at this time. Spoke with Langley Gauss at this time. Patient was referred to Diabetes Clinic and Rheumatologist to see if Prednisone could be d/c'd as Prednisone has been prescribed for Sarcoidosis. Patient has not followed up with Rheumatology or Diabetes Clinic.  Patient's PCP will call our office as soon as they have the patient's glucose under control so that we can look into rescheduling patient's Incisional Hernia Repair.

## 2016-02-18 NOTE — OR Nursing (Signed)
Case cancelled by anesthesia

## 2016-02-27 ENCOUNTER — Telehealth: Payer: Self-pay | Admitting: Surgery

## 2016-02-27 NOTE — Telephone Encounter (Signed)
Patient would like to speak with you about rescheduling her surgery. It was cancelled because her sugar was elevated, but she said now it is down and she is ready to schedule.

## 2016-02-27 NOTE — Telephone Encounter (Signed)
Called patient back in reference to her stating that she is good to go for surgery. I told patient that I would have send her PCP and her Rheumatologist a Medical Clearance Form for them to fill out and return to [place] prior to scheduling her surgery. I also told patient that she needed to be seen by one of our surgeons to do an H&P since it would be more than 30 days that we had seen her. Patient agreed. Patient's appointment with [place] will be on 03/05/2016.  I faxed medical Clearance to PCP: Dr. Ricki Rodriguez and Rheumatologist: Christele Behalal-Bock

## 2016-02-27 NOTE — Telephone Encounter (Signed)
Patient has called and stated that she has seen her Rheumatologist and her PCP and her blood sugar level is under control. Please contact the patient to discuss her medications prior to [place] scheduling surgery with Dr Dahlia Byes.

## 2016-03-04 ENCOUNTER — Other Ambulatory Visit: Payer: Self-pay

## 2016-03-05 ENCOUNTER — Ambulatory Visit: Payer: Self-pay | Admitting: General Surgery

## 2016-03-05 ENCOUNTER — Telehealth: Payer: Self-pay | Admitting: Surgery

## 2016-03-05 NOTE — Telephone Encounter (Signed)
Rescheduled [name] appointment with Dr Dahlia Byes. Her PCP requested to see her in office on May 10th before they would provide medical clearance. She will now see Dr Dahlia Byes on May 17th.

## 2016-03-06 ENCOUNTER — Telehealth: Payer: Self-pay

## 2016-03-06 NOTE — Telephone Encounter (Signed)
Patient will see Dr. Unice Cobble on 03/25/2016 and until after her visit, the doctor will let us know if she will give [place] clearance or not to schedule surgery for the patient.

## 2016-03-09 ENCOUNTER — Ambulatory Visit: Payer: Self-pay | Admitting: Surgery

## 2016-03-25 ENCOUNTER — Telehealth: Payer: Self-pay

## 2016-03-25 DIAGNOSIS — K439 Ventral hernia without obstruction or gangrene: Secondary | ICD-10-CM | POA: Insufficient documentation

## 2016-03-25 NOTE — Telephone Encounter (Signed)
Patient called stating that she saw Dr. Ricki Rodriguez today and was told that she was going to get medical clearance. I told her that we would refax the medical clearance so we could obtain it before she sees Dr. Dahlia Byes on 04/01/2016. Patient understood.

## 2016-04-01 ENCOUNTER — Telehealth: Payer: Self-pay | Admitting: Surgery

## 2016-04-01 ENCOUNTER — Encounter: Payer: Self-pay | Admitting: Surgery

## 2016-04-01 ENCOUNTER — Ambulatory Visit (INDEPENDENT_AMBULATORY_CARE_PROVIDER_SITE_OTHER): Payer: Medicare Other | Admitting: Surgery

## 2016-04-01 VITALS — BP 126/80 | HR 76 | Temp 97.8°F | Wt 204.0 lb

## 2016-04-01 DIAGNOSIS — K432 Incisional hernia without obstruction or gangrene: Secondary | ICD-10-CM

## 2016-04-01 NOTE — Progress Notes (Signed)
History of Present Illness Chelsey [name] is a 65 y.o. female   Past Medical History Chelsey [name] is a 65 year old female with multiple comorbidities including diabetes, peripheral vascular disease, hypertension, asthma and a history of brain aneurysm. She percents with a symptomatic ventral hernia. She reports that she initially had a hernia surgery close to 17 years ago that she does not remember if there was a mesh placement or not and she is not completely sure about the surgery alleviated and the facility. She complains of intermittent dull to sharp pains in the periumbilical area moderate in intensity no evidence of incarceration or strangulation. Her pain exacerbates when she moves and there is no specific alleviating factors. She recently came to the emergency room where a CT scan of the abdomen was performed showing evidence of a recurrent ventral hernia with 2 separate defects. No evidence of incarceration or strangulation. I have personally reviewed the images and there is approximately 2 separate defects one measuring approximately 3 cm and the other one measured about 2 cm.  Past Medical History  Diagnosis Date  . Asthma   . Hypertension   . Arthritis   . Diabetes mellitus without complication ([place])   . Collagen vascular disease ([place])   . Aneurysm [facility])      Past Surgical History  Procedure Laterality Date  . Brain surgery    . Cerebral aneurysm repair    . Hernia repair  2000    ventral    Allergies  Allergen Reactions  . Indomethacin Other (See Comments)    BURN HOLE IN STOMACH  . Percocet [Oxycodone-Acetaminophen] Other (See Comments)    HALLUCINATIONS     Current Outpatient Prescriptions  Medication Sig Dispense Refill  . allopurinol (ZYLOPRIM) 100 MG tablet Take 200 mg by mouth daily.     Marland Kitchen amLODipine (NORVASC) 10 MG tablet Take 10 mg by mouth daily.     Marland Kitchen aspirin EC 81 MG tablet  Take 81 mg by mouth daily.    . Blood Glucose Calibration (TAI DOC CONTROL) NORMAL SOLN     . Blood Glucose Monitoring Suppl (ACCU-CHEK AVIVA PLUS) W/DEVICE KIT Use as directed.    Marland Kitchen COLCRYS 0.6 MG tablet Take 0.6 mg by mouth 2 (two) times daily as needed (for gout flares.).     Marland Kitchen doxycycline (ADOXA) 100 MG tablet Take 100 mg by mouth.    . ferrous sulfate 325 (65 FE) MG tablet Take 325 mg by mouth 2 (two) times daily.    . fluticasone (FLONASE) 50 MCG/ACT nasal spray Place 2 sprays into both nostrils daily as needed. For rhinitis and allergies.    . Fluticasone-Salmeterol (ADVAIR DISKUS) 250-50 MCG/DOSE AEPB Take 2 puffs by mouth.    . furosemide (LASIX) 40 MG tablet Take 40 mg by mouth daily.     Marland Kitchen gabapentin (NEURONTIN) 300 MG capsule Take 300 mg by mouth 3 (three) times daily.    Marland Kitchen glucose blood test strip 1 each daily. Use 1 test strip daily.    . insulin lispro (HUMALOG KWIKPEN) 100 UNIT/ML KiwkPen Inject 20 Units into the skin 3 (three) times daily before meals.    Marland Kitchen LANCETS ULTRA THIN MISC Apply 1 each topically daily.     Marland Kitchen LANTUS 100 UNIT/ML injection Inject 35 Units into the skin 2 (two) times daily.     Marland Kitchen levothyroxine (SYNTHROID, LEVOTHROID) 88 MCG tablet Take 88 mcg by mouth daily before breakfast.     . lisinopril (PRINIVIL,ZESTRIL) 40 MG tablet Take  40 mg by mouth daily.    . metFORMIN (GLUCOPHAGE) 1000 MG tablet 1,000 mg 2 (two) times daily with a meal.     . omeprazole (PRILOSEC) 20 MG capsule Take 20 mg by mouth daily.     . simvastatin (ZOCOR) 20 MG tablet Take 20 mg by mouth every morning.     . traMADol (ULTRAM) 50 MG tablet Take 1 tablet (50 mg total) by mouth every 6 (six) hours as needed. 20 tablet 0  . albuterol (PROAIR HFA) 108 (90 BASE) MCG/ACT inhaler Inhale 2 puffs into the lungs every 6 (six) hours as needed. For wheezing or shortness of breath.     No current  facility-administered medications for this visit.    Family History Family History  Problem Relation Age of Onset  . Heart disease Mother   . Hypertension Mother   . Diabetes Mother   . Diabetes Father   . Heart disease Father   . Hypertension Father     Social History Social History  Substance Use Topics  . Smoking status: Former Smoker    Quit date: 01/27/2006  . Smokeless tobacco: Never Used     Comment: quit   . Alcohol Use: No       ROS Temporal review of system was performed and is otherwise negative other than what is stated in the history of present illness  Physical Exam Blood pressure 144/80, pulse 87, temperature 97.4 F (36.3 C), temperature source Oral, height _0  (1.626 m), weight 95.255 kg (210 lb), SpO2 94 %.  CONSTITUTIONAL: No acute distress,  EYES: Pupils equal, round, and reactive to light, Sclera non-icteric. EARS, NOSE, MOUTH AND THROAT: The oropharynx is clear. Oral mucosa is pink and moist. Hearing is intact to voice.  NECK: Trachea is midline, and there is no jugular venous distension. Thyroid is without palpable abnormalities. LYMPH NODES: Lymph nodes in the neck are not enlarged. RESPIRATORY: Lungs are clear, and breath sounds are equal bilaterally. Normal respiratory effort without pathologic use of accessory muscles. CARDIOVASCULAR: Heart is regular without murmurs, gallops, or rubs. GI: The abdomen is soft, nondistended. There is ventral hernia in the periumbilical area and a previous midline laparotomy scar. The hernia is reducible but is mildly tender to palpation. There were no palpable masses. There was no hepatosplenomegaly. There were normal bowel sounds. MUSCULOSKELETAL: Normal muscle strength and tone in all four extremities.  SKIN: Skin turgor is normal. There are no pathologic skin lesions.  NEUROLOGIC: Motor and sensation is grossly normal. Cranial nerves are grossly  intact. PSYCH: Alert and oriented to person, place and time. Affect is normal.  Data Reviewed I have personally reviewed the patient's imaging and medical records.   Assessment/Plan  Recurrent symptomatic ventral hernia. D/W with the patient about her disease process and my recommendation for repair. Discussed about the procedure in detail including laparoscopic versus open approach and the need for mesh placement.I have explained the procedure, risks, and aftercare of hernia repair to Chelsey [name]. Risks include but are not limited to bleeding, infection, wound problems, anesthesia, recurrence, bladder or intestine injury, urinary retention, chronic pain, mesh problems. She seems to understand and agrees to proceed. Questions were answered to her stated satisfaction. Extensive counseling provided . Given all her comorbidities will make sure that she will have a preanesthetic consultation. sHE IS OFF  The steroids and her BS are much better controlled, she has seen her PCP and was given the [facility] to proceed with surgery. All questions answered

## 2016-04-01 NOTE — Telephone Encounter (Signed)
Pt advised of pre op date/time and sx date. Sx: 04/28/16  With Dr De Burrs hernia repair.  Pre op: 04/20/16 @ 9:45am--Office.   Patient made aware to call [phone], between 1-3:00pm the day before surgery, to find out what time to arrive.

## 2016-04-01 NOTE — Addendum Note (Signed)
Addended by: Caroleen Hamman F on: 04/01/2016 09:58 AM   Modules accepted: Orders

## 2016-04-02 ENCOUNTER — Telehealth: Payer: Self-pay | Admitting: Surgery

## 2016-04-02 NOTE — Telephone Encounter (Signed)
i have called patient to advise her of the change of surgery date with Dr Dahlia Byes. Patient was informed that the date has been changed from 04/28/16 to 04/29/16. Patient verbalized understanding and was ok with the change of date.

## 2016-04-20 ENCOUNTER — Encounter
Admission: RE | Admit: 2016-04-20 | Discharge: 2016-04-20 | Disposition: A | Discharge status: Home / Self Care | Payer: Medicare Other | Source: Ambulatory Visit | Attending: Surgery | Admitting: Surgery

## 2016-04-20 DIAGNOSIS — Z01812 Encounter for preprocedural laboratory examination: Secondary | ICD-10-CM | POA: Diagnosis not present

## 2016-04-20 HISTORY — DX: Chronic obstructive pulmonary disease, unspecified: J44.9

## 2016-04-20 HISTORY — DX: Unspecified convulsions: R56.9

## 2016-04-20 LAB — BASIC METABOLIC PANEL
ANION GAP: 10 (ref 5–15)
BUN: 35 mg/dL — ABNORMAL HIGH (ref 6–20)
CO2: 18 mmol/L — AB (ref 22–32)
Calcium: 9.2 mg/dL (ref 8.9–10.3)
Chloride: 113 mmol/L — ABNORMAL HIGH (ref 101–111)
Creatinine, Ser: 1.26 mg/dL — ABNORMAL HIGH (ref 0.44–1.00)
GFR calc Af Amer: 50 mL/min — ABNORMAL LOW (ref >60)
GFR, EST NON AFRICAN AMERICAN: 43 mL/min — AB (ref >60)
GLUCOSE: 78 mg/dL (ref 65–99)
POTASSIUM: 4 mmol/L (ref 3.5–5.1)
Sodium: 141 mmol/L (ref 135–145)

## 2016-04-20 LAB — HEMOGLOBIN: Hemoglobin: 13.3 g/dL (ref 12.0–16.0)

## 2016-04-20 NOTE — Patient Instructions (Signed)
  Your procedure is scheduled on: 04-29-16 [facility]) Report to [place] To find out your arrival time please call [phone] between 1PM - 3PM on 04-28-16 (TUESDAY)  Remember: Instructions that are not followed completely may result in serious medical risk, up to and including death, or upon the discretion of your surgeon and anesthesiologist your surgery may need to be rescheduled.    _X___ 1. Do not eat food or drink liquids after midnight. No gum chewing or hard candies.     _X___ 2. No Alcohol for 24 hours before or after surgery.   ____ 3. Bring all medications with you on the day of surgery if instructed.    _X___ 4. Notify your doctor if there is any change in your medical condition     (cold, fever, infections).     Do not wear jewelry, make-up, hairpins, clips or nail polish.  Do not wear lotions, powders, or perfumes. You may wear deodorant.  Do not shave 48 hours prior to surgery. Men may shave face and neck.  Do not bring valuables to the hospital.    [facility] is not responsible for any belongings or valuables.               Contacts, dentures or bridgework may not be worn into surgery.  Leave your suitcase in the car. After surgery it may be brought to your room.  For patients admitted to the hospital, discharge time is determined by your treatment team.   Patients discharged the day of surgery will not be allowed to drive home.   Please read over the following fact sheets that you were given:     _X___ Take these medicines the morning of surgery with A SIP OF WATER:    1. LEVOTHYROXINE (SYNTHROID)  2. AMLODIPINE (NORVASC)  3. GABAPENTIN (NEURONTIN)  4. LISINOPRIL  5. SIMVASTATIN (ZOCOR)  6. OMEPRAZOLE (PRILOSEC)             7. TAKE AN EXTRA OMEPRAZOLE (PRILOSEC) Tuesday NIGHT BEFORE BED (04-28-16)  ____ Fleet Enema (as directed)   _X___ Use CHG Soap as directed  _X___ Use inhalers on the day of surgery-USE ADVAIR AND  [place] AM OF SURGERY  _X___ Stop metformin 2 days prior to surgery-LAST DOSE ON Sunday, June 11TH    _X___ Take 1/2 of usual insulin dose the night before surgery and none on the morning of surgery-TAKE HALF OF YOUR LANTUS ON Tuesday NIGHT AND NO INSULIN THE MORNING OF SURGERY  _X___ Stop Coumadin/Plavix/aspirin-STOP ASPIRIN 7 DAYS PRIOR TO SURGERY  _X___ Stop Anti-inflammatories-NO NSAIDS OR ASPIRIN PRODUCTS 7 DAYS PRIOR TO SURGERY-TYLENOL OK TO TAKE    ____ Stop supplements until after surgery.    ____ Bring C-Pap to the hospital.

## 2016-04-22 ENCOUNTER — Ambulatory Visit (INDEPENDENT_AMBULATORY_CARE_PROVIDER_SITE_OTHER): Payer: Medicare Other | Admitting: *Deleted

## 2016-04-22 ENCOUNTER — Ambulatory Visit: Payer: Medicare Other | Admitting: *Deleted

## 2016-04-22 DIAGNOSIS — E1142 Type 2 diabetes mellitus with diabetic polyneuropathy: Secondary | ICD-10-CM | POA: Diagnosis not present

## 2016-04-22 DIAGNOSIS — Q6652 Congenital pes planus, left foot: Secondary | ICD-10-CM | POA: Diagnosis not present

## 2016-04-28 ENCOUNTER — Ambulatory Visit: Payer: Medicare Other | Admitting: Sports Medicine

## 2016-04-29 ENCOUNTER — Encounter
Admission: RE | Disposition: A | Discharge status: Home / Self Care | Payer: Self-pay | Source: Ambulatory Visit | Attending: Surgery

## 2016-04-29 ENCOUNTER — Ambulatory Visit: Payer: Medicare Other | Admitting: Anesthesiology

## 2016-04-29 ENCOUNTER — Ambulatory Visit
Admission: RE | Admit: 2016-04-29 | Discharge: 2016-04-29 | Disposition: A | Discharge status: Home / Self Care | Payer: Medicare Other | Source: Ambulatory Visit | Attending: Surgery | Admitting: Surgery

## 2016-04-29 ENCOUNTER — Encounter: Payer: Self-pay | Admitting: *Deleted

## 2016-04-29 DIAGNOSIS — Z888 Allergy status to other drugs, medicaments and biological substances status: Secondary | ICD-10-CM | POA: Diagnosis not present

## 2016-04-29 DIAGNOSIS — Z95828 Presence of other vascular implants and grafts: Secondary | ICD-10-CM | POA: Insufficient documentation

## 2016-04-29 DIAGNOSIS — Z9889 Other specified postprocedural states: Secondary | ICD-10-CM | POA: Diagnosis not present

## 2016-04-29 DIAGNOSIS — Z885 Allergy status to narcotic agent status: Secondary | ICD-10-CM | POA: Diagnosis not present

## 2016-04-29 DIAGNOSIS — I10 Essential (primary) hypertension: Secondary | ICD-10-CM | POA: Insufficient documentation

## 2016-04-29 DIAGNOSIS — M069 Rheumatoid arthritis, unspecified: Secondary | ICD-10-CM | POA: Insufficient documentation

## 2016-04-29 DIAGNOSIS — M1990 Unspecified osteoarthritis, unspecified site: Secondary | ICD-10-CM | POA: Insufficient documentation

## 2016-04-29 DIAGNOSIS — Z87891 Personal history of nicotine dependence: Secondary | ICD-10-CM | POA: Insufficient documentation

## 2016-04-29 DIAGNOSIS — D860 Sarcoidosis of lung: Secondary | ICD-10-CM | POA: Insufficient documentation

## 2016-04-29 DIAGNOSIS — K219 Gastro-esophageal reflux disease without esophagitis: Secondary | ICD-10-CM | POA: Diagnosis not present

## 2016-04-29 DIAGNOSIS — K430 Incisional hernia with obstruction, without gangrene: Secondary | ICD-10-CM | POA: Diagnosis not present

## 2016-04-29 DIAGNOSIS — Z7982 Long term (current) use of aspirin: Secondary | ICD-10-CM | POA: Insufficient documentation

## 2016-04-29 DIAGNOSIS — M109 Gout, unspecified: Secondary | ICD-10-CM | POA: Diagnosis not present

## 2016-04-29 DIAGNOSIS — K432 Incisional hernia without obstruction or gangrene: Secondary | ICD-10-CM

## 2016-04-29 DIAGNOSIS — Z79899 Other long term (current) drug therapy: Secondary | ICD-10-CM | POA: Insufficient documentation

## 2016-04-29 DIAGNOSIS — I739 Peripheral vascular disease, unspecified: Secondary | ICD-10-CM | POA: Diagnosis not present

## 2016-04-29 DIAGNOSIS — J449 Chronic obstructive pulmonary disease, unspecified: Secondary | ICD-10-CM | POA: Diagnosis not present

## 2016-04-29 DIAGNOSIS — Z8249 Family history of ischemic heart disease and other diseases of the circulatory system: Secondary | ICD-10-CM | POA: Diagnosis not present

## 2016-04-29 DIAGNOSIS — Z794 Long term (current) use of insulin: Secondary | ICD-10-CM | POA: Diagnosis not present

## 2016-04-29 DIAGNOSIS — Z7951 Long term (current) use of inhaled steroids: Secondary | ICD-10-CM | POA: Diagnosis not present

## 2016-04-29 DIAGNOSIS — Z833 Family history of diabetes mellitus: Secondary | ICD-10-CM | POA: Diagnosis not present

## 2016-04-29 DIAGNOSIS — J45909 Unspecified asthma, uncomplicated: Secondary | ICD-10-CM | POA: Insufficient documentation

## 2016-04-29 DIAGNOSIS — Z8679 Personal history of other diseases of the circulatory system: Secondary | ICD-10-CM | POA: Diagnosis not present

## 2016-04-29 DIAGNOSIS — E119 Type 2 diabetes mellitus without complications: Secondary | ICD-10-CM | POA: Insufficient documentation

## 2016-04-29 DIAGNOSIS — R569 Unspecified convulsions: Secondary | ICD-10-CM | POA: Diagnosis not present

## 2016-04-29 HISTORY — PX: VENTRAL HERNIA REPAIR: SHX424

## 2016-04-29 LAB — GLUCOSE, CAPILLARY
Glucose-Capillary: 209 mg/dL — ABNORMAL HIGH (ref 65–99)
Glucose-Capillary: 228 mg/dL — ABNORMAL HIGH (ref 65–99)

## 2016-04-29 SURGERY — REPAIR, HERNIA, VENTRAL
Anesthesia: General | Wound class: Clean

## 2016-04-29 MED ORDER — GLYCOPYRROLATE 0.2 MG/ML IJ SOLN
INTRAMUSCULAR | Status: DC | PRN
  Administered 2016-04-29: 0.6 mg via INTRAVENOUS

## 2016-04-29 MED ORDER — NEOSTIGMINE METHYLSULFATE 10 MG/10ML IV SOLN
INTRAVENOUS | Status: DC | PRN
  Administered 2016-04-29: 3 mg via INTRAVENOUS

## 2016-04-29 MED ORDER — ACETAMINOPHEN 10 MG/ML IV SOLN
INTRAVENOUS | Status: AC
  Filled 2016-04-29: qty 100

## 2016-04-29 MED ORDER — KETOROLAC TROMETHAMINE 30 MG/ML IJ SOLN
INTRAMUSCULAR | Status: DC | PRN
  Administered 2016-04-29: 30 mg via INTRAVENOUS

## 2016-04-29 MED ORDER — CHLORHEXIDINE GLUCONATE 4 % EX LIQD: 1.0000 "application " | Freq: Once | CUTANEOUS | Status: DC

## 2016-04-29 MED ORDER — HYDROCODONE-ACETAMINOPHEN 7.5-325 MG PO TABS
1.0000 | ORAL_TABLET | ORAL | Status: DC | PRN
  Administered 2016-04-29: 1 via ORAL

## 2016-04-29 MED ORDER — EPHEDRINE SULFATE 50 MG/ML IJ SOLN
INTRAMUSCULAR | Status: DC | PRN
  Administered 2016-04-29 (×2): 10 mg via INTRAVENOUS

## 2016-04-29 MED ORDER — IPRATROPIUM-ALBUTEROL 0.5-2.5 (3) MG/3ML IN SOLN
3.0000 mL | Freq: Once | RESPIRATORY_TRACT | Status: AC
  Administered 2016-04-29: 3 mL via RESPIRATORY_TRACT

## 2016-04-29 MED ORDER — IPRATROPIUM-ALBUTEROL 0.5-2.5 (3) MG/3ML IN SOLN
RESPIRATORY_TRACT | Status: AC
  Filled 2016-04-29: qty 3

## 2016-04-29 MED ORDER — ACETAMINOPHEN 10 MG/ML IV SOLN
INTRAVENOUS | Status: DC | PRN
  Administered 2016-04-29: 1000 mg via INTRAVENOUS

## 2016-04-29 MED ORDER — BUPIVACAINE-EPINEPHRINE (PF) 0.25% -1:200000 IJ SOLN
INTRAMUSCULAR | Status: AC
  Filled 2016-04-29: qty 30

## 2016-04-29 MED ORDER — FENTANYL CITRATE (PF) 100 MCG/2ML IJ SOLN
INTRAMUSCULAR | Status: DC | PRN
  Administered 2016-04-29 (×2): 50 ug via INTRAVENOUS
  Administered 2016-04-29: 100 ug via INTRAVENOUS

## 2016-04-29 MED ORDER — HEPARIN SODIUM (PORCINE) 5000 UNIT/ML IJ SOLN
5000.0000 [IU] | Freq: Once | INTRAMUSCULAR | Status: AC
  Administered 2016-04-29: 5000 [IU] via SUBCUTANEOUS

## 2016-04-29 MED ORDER — LACTATED RINGERS IV SOLN
INTRAVENOUS | Status: DC | PRN
  Administered 2016-04-29: 11:00:00 via INTRAVENOUS

## 2016-04-29 MED ORDER — FENTANYL CITRATE (PF) 100 MCG/2ML IJ SOLN
25.0000 ug | INTRAMUSCULAR | Status: DC | PRN
  Administered 2016-04-29: 50 ug via INTRAVENOUS

## 2016-04-29 MED ORDER — MIDAZOLAM HCL 2 MG/2ML IJ SOLN
INTRAMUSCULAR | Status: DC | PRN
  Administered 2016-04-29: 2 mg via INTRAVENOUS

## 2016-04-29 MED ORDER — HYDROCODONE-ACETAMINOPHEN 7.5-325 MG PO TABS
ORAL_TABLET | ORAL | Status: AC
  Filled 2016-04-29: qty 1

## 2016-04-29 MED ORDER — CEFAZOLIN SODIUM-DEXTROSE 2-4 GM/100ML-% IV SOLN
INTRAVENOUS | Status: AC
  Filled 2016-04-29: qty 100

## 2016-04-29 MED ORDER — ROCURONIUM BROMIDE 100 MG/10ML IV SOLN
INTRAVENOUS | Status: DC | PRN
  Administered 2016-04-29: 50 mg via INTRAVENOUS

## 2016-04-29 MED ORDER — BUPIVACAINE-EPINEPHRINE (PF) 0.25% -1:200000 IJ SOLN
INTRAMUSCULAR | Status: DC | PRN
  Administered 2016-04-29: 30 mL via PERINEURAL

## 2016-04-29 MED ORDER — SUCCINYLCHOLINE CHLORIDE 20 MG/ML IJ SOLN
INTRAMUSCULAR | Status: DC | PRN
  Administered 2016-04-29: 100 mg via INTRAVENOUS

## 2016-04-29 MED ORDER — HYDROCODONE-ACETAMINOPHEN 7.5-325 MG PO TABS: 1.0000 | ORAL_TABLET | ORAL | Status: DC | PRN

## 2016-04-29 MED ORDER — CEFAZOLIN SODIUM-DEXTROSE 2-3 GM-% IV SOLR
INTRAVENOUS | Status: DC | PRN
  Administered 2016-04-29: 2 g via INTRAVENOUS

## 2016-04-29 MED ORDER — FENTANYL CITRATE (PF) 100 MCG/2ML IJ SOLN
INTRAMUSCULAR | Status: AC
  Filled 2016-04-29: qty 2

## 2016-04-29 MED ORDER — CEFAZOLIN SODIUM-DEXTROSE 2-4 GM/100ML-% IV SOLN: 2.0000 g | INTRAVENOUS | Status: DC

## 2016-04-29 MED ORDER — SODIUM CHLORIDE 0.9 % IV SOLN
INTRAVENOUS | Status: DC
  Administered 2016-04-29: 10:00:00 via INTRAVENOUS

## 2016-04-29 MED ORDER — LIDOCAINE HCL (CARDIAC) 20 MG/ML IV SOLN
INTRAVENOUS | Status: DC | PRN
  Administered 2016-04-29: 80 mg via INTRAVENOUS

## 2016-04-29 MED ORDER — HEPARIN SODIUM (PORCINE) 5000 UNIT/ML IJ SOLN
INTRAMUSCULAR | Status: AC
  Filled 2016-04-29: qty 1

## 2016-04-29 MED ORDER — PROPOFOL 10 MG/ML IV BOLUS
INTRAVENOUS | Status: DC | PRN
  Administered 2016-04-29: 160 mg via INTRAVENOUS

## 2016-04-29 SURGICAL SUPPLY — 37 items
APPLICATOR COTTON TIP 6IN STRL (MISCELLANEOUS) ×6 IMPLANT
CANISTER SUCT 3000ML (MISCELLANEOUS) ×3 IMPLANT
CHLORAPREP W/TINT 26ML (MISCELLANEOUS) ×3 IMPLANT
DEVICE SECURE STRAP 25 ABSORB (INSTRUMENTS) ×6 IMPLANT
DRSG TELFA 3X8 NADH (GAUZE/BANDAGES/DRESSINGS) ×3 IMPLANT
ELECT REM PT RETURN 9FT ADLT (ELECTROSURGICAL) ×3
ELECTRODE REM PT RTRN 9FT ADLT (ELECTROSURGICAL) ×1 IMPLANT
GAUZE SPONGE 4X4 12PLY STRL (GAUZE/BANDAGES/DRESSINGS) IMPLANT
GLOVE BIO SURGEON STRL SZ7 (GLOVE) ×3 IMPLANT
GOWN STRL REUS W/ TWL LRG LVL3 (GOWN DISPOSABLE) ×2 IMPLANT
GOWN STRL REUS W/TWL LRG LVL3 (GOWN DISPOSABLE) ×6
GRASPER SUT TROCAR 14GX15 (MISCELLANEOUS) ×3 IMPLANT
HANDLE YANKAUER SUCT BULB TIP (MISCELLANEOUS) ×6 IMPLANT
IRRIGATION STRYKERFLOW (MISCELLANEOUS) IMPLANT
IRRIGATOR STRYKERFLOW (MISCELLANEOUS)
L-HOOK LAP DISP 36CM (ELECTROSURGICAL) ×6
LHOOK LAP DISP 36CM (ELECTROSURGICAL) ×2 IMPLANT
LIQUID BAND (GAUZE/BANDAGES/DRESSINGS) ×3 IMPLANT
MESH VENTRALIGHT ST 6X8 (Mesh Specialty) ×3 IMPLANT
MESH VENTRLGHT ELLIPSE 8X6XMFL (Mesh Specialty) ×1 IMPLANT
NDL INSUFF ACCESS 14 VERSASTEP (NEEDLE) IMPLANT
NEEDLE HYPO 25X1 1.5 SAFETY (NEEDLE) ×3 IMPLANT
PACK LAP CHOLECYSTECTOMY (MISCELLANEOUS) ×3 IMPLANT
PENCIL ELECTRO HAND CTR (MISCELLANEOUS) ×6 IMPLANT
SCISSORS METZENBAUM CVD 33 (INSTRUMENTS) ×3 IMPLANT
SLEEVE ENDOPATH XCEL 5M (ENDOMECHANICALS) ×6 IMPLANT
SPONGE LAP 18X18 5 PK (GAUZE/BANDAGES/DRESSINGS) ×3 IMPLANT
SUT ETHIBOND 0 MO6 C/R (SUTURE) ×9 IMPLANT
SUT ETHIBOND NAB MO 7 #0 18IN (SUTURE) ×3 IMPLANT
SUT MNCRL AB 4-0 PS2 18 (SUTURE) ×6 IMPLANT
SUT VIC AB 0 CT2 27 (SUTURE) ×3 IMPLANT
SUT VIC AB 2-0 SH 27 (SUTURE) ×6
SUT VIC AB 2-0 SH 27XBRD (SUTURE) ×2 IMPLANT
TROCAR XCEL NON-BLD 11X100MML (ENDOMECHANICALS) IMPLANT
TROCAR XCEL NON-BLD 5MMX100MML (ENDOMECHANICALS) ×3 IMPLANT
TUBING INSUFFLATOR HI FLOW (MISCELLANEOUS) ×3 IMPLANT
WATER STERILE IRR 1000ML POUR (IV SOLUTION) ×3 IMPLANT

## 2016-04-29 NOTE — Transfer of Care (Signed)
Immediate Anesthesia Transfer of Care Note  Patient: Chelsey [name]  Procedure(s) Performed: Procedure(s): Laparoscopic HERNIA REPAIR VENTRAL ADULT (N/A)  Patient Location: PACU  Anesthesia Type:General  Level of Consciousness: awake and alert   Airway & Oxygen Therapy: Patient Spontanous Breathing and Patient connected to face mask oxygen  Post-op Assessment: Report given to RN  Post vital signs: Reviewed and stable  Last Vitals:  Filed Vitals:   04/29/16 0935 04/29/16 1315  BP: 120/69 138/73  Pulse: 69 85  Temp: 36.7 C 37 C  Resp: 18 21    Last Pain: There were no vitals filed for this visit.       Complications: No apparent anesthesia complications

## 2016-04-29 NOTE — Interval H&P Note (Signed)
History and Physical Interval Note:  04/29/2016 10:48 AM  Chelsey [name]  has presented today for surgery, with the diagnosis of incisional hernia  The various methods of treatment have been discussed with the patient and family. After consideration of risks, benefits and other options for treatment, the patient has consented to  Procedure(s): HERNIA REPAIR VENTRAL ADULT (N/A) as a surgical intervention .  The patient's history has been reviewed, patient examined, no change in status, stable for surgery.  I have reviewed the patient's chart and labs.  Questions were answered to the patient's satisfaction.     [place]

## 2016-04-29 NOTE — H&P (View-Only) (Signed)
History of Present Illness Chelsey [name] is a 65 y.o. female   Past Medical History Chelsey [name] is a 65 year old female with multiple comorbidities including diabetes, peripheral vascular disease, hypertension, asthma and a history of brain aneurysm. She percents with a symptomatic ventral hernia. She reports that she initially had a hernia surgery close to 17 years ago that she does not remember if there was a mesh placement or not and she is not completely sure about the surgery alleviated and the facility. She complains of intermittent dull to sharp pains in the periumbilical area moderate in intensity no evidence of incarceration or strangulation. Her pain exacerbates when she moves and there is no specific alleviating factors. She recently came to the emergency room where a CT scan of the abdomen was performed showing evidence of a recurrent ventral hernia with 2 separate defects. No evidence of incarceration or strangulation. I have personally reviewed the images and there is approximately 2 separate defects one measuring approximately 3 cm and the other one measured about 2 cm.  Past Medical History  Diagnosis Date  . Asthma   . Hypertension   . Arthritis   . Diabetes mellitus without complication ([place])   . Collagen vascular disease ([place])   . Aneurysm [facility])      Past Surgical History  Procedure Laterality Date  . Brain surgery    . Cerebral aneurysm repair    . Hernia repair  2000    ventral    Allergies  Allergen Reactions  . Indomethacin Other (See Comments)    BURN HOLE IN STOMACH  . Percocet [Oxycodone-Acetaminophen] Other (See Comments)    HALLUCINATIONS     Current Outpatient Prescriptions  Medication Sig Dispense Refill  . allopurinol (ZYLOPRIM) 100 MG tablet Take 200 mg by mouth daily.     Marland Kitchen amLODipine (NORVASC) 10 MG tablet Take 10 mg by mouth daily.     Marland Kitchen aspirin EC 81 MG tablet  Take 81 mg by mouth daily.    . Blood Glucose Calibration (TAI DOC CONTROL) NORMAL SOLN     . Blood Glucose Monitoring Suppl (ACCU-CHEK AVIVA PLUS) W/DEVICE KIT Use as directed.    Marland Kitchen COLCRYS 0.6 MG tablet Take 0.6 mg by mouth 2 (two) times daily as needed (for gout flares.).     Marland Kitchen doxycycline (ADOXA) 100 MG tablet Take 100 mg by mouth.    . ferrous sulfate 325 (65 FE) MG tablet Take 325 mg by mouth 2 (two) times daily.    . fluticasone (FLONASE) 50 MCG/ACT nasal spray Place 2 sprays into both nostrils daily as needed. For rhinitis and allergies.    . Fluticasone-Salmeterol (ADVAIR DISKUS) 250-50 MCG/DOSE AEPB Take 2 puffs by mouth.    . furosemide (LASIX) 40 MG tablet Take 40 mg by mouth daily.     Marland Kitchen gabapentin (NEURONTIN) 300 MG capsule Take 300 mg by mouth 3 (three) times daily.    Marland Kitchen glucose blood test strip 1 each daily. Use 1 test strip daily.    . insulin lispro (HUMALOG KWIKPEN) 100 UNIT/ML KiwkPen Inject 20 Units into the skin 3 (three) times daily before meals.    Marland Kitchen LANCETS ULTRA THIN MISC Apply 1 each topically daily.     Marland Kitchen LANTUS 100 UNIT/ML injection Inject 35 Units into the skin 2 (two) times daily.     Marland Kitchen levothyroxine (SYNTHROID, LEVOTHROID) 88 MCG tablet Take 88 mcg by mouth daily before breakfast.     . lisinopril (PRINIVIL,ZESTRIL) 40 MG tablet Take  40 mg by mouth daily.    . metFORMIN (GLUCOPHAGE) 1000 MG tablet 1,000 mg 2 (two) times daily with a meal.     . omeprazole (PRILOSEC) 20 MG capsule Take 20 mg by mouth daily.     . simvastatin (ZOCOR) 20 MG tablet Take 20 mg by mouth every morning.     . traMADol (ULTRAM) 50 MG tablet Take 1 tablet (50 mg total) by mouth every 6 (six) hours as needed. 20 tablet 0  . albuterol (PROAIR HFA) 108 (90 BASE) MCG/ACT inhaler Inhale 2 puffs into the lungs every 6 (six) hours as needed. For wheezing or shortness of breath.     No current  facility-administered medications for this visit.    Family History Family History  Problem Relation Age of Onset  . Heart disease Mother   . Hypertension Mother   . Diabetes Mother   . Diabetes Father   . Heart disease Father   . Hypertension Father     Social History Social History  Substance Use Topics  . Smoking status: Former Smoker    Quit date: 01/27/2006  . Smokeless tobacco: Never Used     Comment: quit   . Alcohol Use: No       ROS Temporal review of system was performed and is otherwise negative other than what is stated in the history of present illness  Physical Exam Blood pressure 144/80, pulse 87, temperature 97.4 F (36.3 C), temperature source Oral, height _0  (1.626 m), weight 95.255 kg (210 lb), SpO2 94 %.  CONSTITUTIONAL: No acute distress,  EYES: Pupils equal, round, and reactive to light, Sclera non-icteric. EARS, NOSE, MOUTH AND THROAT: The oropharynx is clear. Oral mucosa is pink and moist. Hearing is intact to voice.  NECK: Trachea is midline, and there is no jugular venous distension. Thyroid is without palpable abnormalities. LYMPH NODES: Lymph nodes in the neck are not enlarged. RESPIRATORY: Lungs are clear, and breath sounds are equal bilaterally. Normal respiratory effort without pathologic use of accessory muscles. CARDIOVASCULAR: Heart is regular without murmurs, gallops, or rubs. GI: The abdomen is soft, nondistended. There is ventral hernia in the periumbilical area and a previous midline laparotomy scar. The hernia is reducible but is mildly tender to palpation. There were no palpable masses. There was no hepatosplenomegaly. There were normal bowel sounds. MUSCULOSKELETAL: Normal muscle strength and tone in all four extremities.  SKIN: Skin turgor is normal. There are no pathologic skin lesions.  NEUROLOGIC: Motor and sensation is grossly normal. Cranial nerves are grossly  intact. PSYCH: Alert and oriented to person, place and time. Affect is normal.  Data Reviewed I have personally reviewed the patient's imaging and medical records.   Assessment/Plan  Recurrent symptomatic ventral hernia. D/W with the patient about her disease process and my recommendation for repair. Discussed about the procedure in detail including laparoscopic versus open approach and the need for mesh placement.I have explained the procedure, risks, and aftercare of hernia repair to Penelope Galas. Risks include but are not limited to bleeding, infection, wound problems, anesthesia, recurrence, bladder or intestine injury, urinary retention, chronic pain, mesh problems. She seems to understand and agrees to proceed. Questions were answered to her stated satisfaction. Extensive counseling provided . Given all her comorbidities will make sure that she will have a preanesthetic consultation. sHE IS OFF  The steroids and her BS are much better controlled, she has seen her PCP and was given the [facility] to proceed with surgery. All questions answered

## 2016-04-29 NOTE — Anesthesia Procedure Notes (Signed)
Procedure Name: Intubation Date/Time: 04/29/2016 11:21 AM Performed by: [id number], Fawna Cranmer Pre-anesthesia Checklist: Timeout performed, Patient being monitored, Patient identified, Emergency Drugs available and Suction available Patient Re-evaluated:Patient Re-evaluated prior to inductionOxygen Delivery Method: Circle system utilized Preoxygenation: Pre-oxygenation with 100% oxygen Intubation Type: IV induction Laryngoscope Size: Mac and 3 Grade View: Grade I Tube type: Oral Tube size: 7.0 mm Number of attempts: 1 Placement Confirmation: breath sounds checked- equal and bilateral,  CO2 detector,  positive ETCO2 and ETT inserted through vocal cords under direct vision Secured at: 22 cm Tube secured with: Tape

## 2016-04-29 NOTE — Op Note (Addendum)
Abdominal Hernia Repair  PROCEDURES: 1. Extensive lysis of adhesions taking at least 40 minutes of total operative time 2. Laparoscopic repair of recurrent ventral hernia  ( 15X20cm ventralight mesh BARD ) defect measures 5 CMS 3. partial excision of incarcerated omentum  Pre-operative Diagnosis: Symptomatic recurrent ventral hernia  Post-operative Diagnosis: SAME   Surgeon: Caroleen Hamman, MD FACS  Anesthesia: Gen. with endotracheal tube   Findings: 5 cm ventral hernia defect with chronic incarceration of omentum  Extensive lysis of adhesions. Dense adhesions from the abdominal wall to the omentum and there was some transverse colon that was also adhered to the abdominal wall  Estimated Blood Loss: 10cc         Drains: none         Specimens: omentum       Complications: none              Procedure Details  The patient was seen again in the Holding Room. The benefits, complications, treatment options, and expected outcomes were discussed with the patient. The risks of bleeding, infection, recurrence of symptoms, failure to resolve symptoms, bowel injury, mesh placement, mesh infection, any of which could require further surgery were reviewed with the patient. The likelihood of improving the patient's symptoms with return to their baseline status is good.  The patient and/or family concurred with the proposed plan, giving informed consent.  The patient was taken to Operating Room, identified as Chelsey [name] and the procedure verified.  A Time Out was held and the above information confirmed.  Prior to the induction of general anesthesia, antibiotic prophylaxis was administered. VTE prophylaxis was in place. General endotracheal anesthesia was then administered and tolerated well. After the induction, the abdomen was prepped with Chloraprep and draped in the sterile fashion. The patient was positioned in the supine position.   Incision was created with a scalpel over the hernia  defect. Electrocautery was used to dissect through subcutaneous tissue, the hernia sac was opened and rises of adhesion was performed with Metzenbaum's scissors. Hernia sac was excised. A piece of omentum was chronically incarcerated and was excised w cautery. The hernia was measured and the mesh was selected. Interrupted Vicryl sutures were placed on each corner of the mesh and then was inserted through the hernia defect. 2 5 mm ports were placed in the abdominal cavity under direct visualization I closed the hernia defect with interrupted 0 Ethibond sutures. Pneumoperitoneum was created and diagnostic laparoscopy was able to confirm the adequate repair of the defect.  Completion of laparoscopic lysis of adhesions was done with a combination of electrocautery and laparoscopic scissors. Once we have an adequate visualization of the abdominal wall and all the adhesions were taken down were able to advance with the operation.  The mesh was placed in an underlay fashion. 4 trans-fascial 0 vicryl sutures were placed and used to anchor the mesh. Using the laparoscopic tacker we anchored the mesh circumferentially to the abdominal wall. The mesh layed really nicely against the  abdominal wall. A second look laparoscopy revealed no evidence of intra-abdominal injury. The laparoscopic ports were removed under direct visualization and the pneumoperitoneum was deflated.  Incisions were closed in a 2 layer fashion with 3-0 Vicryl and 4-0 Monocryl. Dermabond was used to coat the skin. Marcaine quarter percent with epinephrine and lidocaine 1% was used to inject all the incision sites. Patient tolerated procedure well and there were no immediate complications. Needle and laparotomy counts were correct   Caroleen Hamman, MD,  FACS

## 2016-04-29 NOTE — Discharge Instructions (Signed)
Laparoscopic Ventral Hernia Repair, Care After Refer to this sheet in the next few weeks. These instructions provide you with information on caring for yourself after your procedure. Your caregiver may also give you more specific instructions. Your treatment has been planned according to current medical practices, but problems sometimes occur. Call your caregiver if you have any problems or questions after your procedure.  HOME CARE INSTRUCTIONS   Only take over-the-counter or prescription medicines as directed by your caregiver. If antibiotic medicines are prescribed, take them as directed. Finish them even if you start to feel better.  Always wash your hands before touching your abdomen.  Take your bandages (dressings) off after 48 hours or as directed by your caregiver. You may have skin adhesive strips or skin glue over the surgical cuts (incisions). Do not take the strips off or peel the glue off. These will fall off on their own.  Take showers once your caregiver approves. Until then, only take sponge baths. Do not take tub baths or go swimming until your caregiver approves.  Check your incision area every day for swelling, redness, warmth, and blood or fluid leaking from the incision. These are signs of infection. Wash your hands before you check.  Hold a pillow over your abdomen when you cough or sneeze to help ease pain.  Eat foods that are high in fiber, such as whole grains, fruits, and vegetables. Fiber helps prevent difficult bowel movements (constipation).  Drink enough fluids to keep your urine clear or pale yellow.  Rest and lessen activity for 4-5 days after the surgery. You may take short walks if your caregiver approves. Do not drive until approved by your caregiver.  Do not lift anything heavy, participate in sports, or have sexual intercourse for 6-8 weeks or until your caregiver approves.   Ask your caregiver when you can return to work.  Keep all follow-up  appointments. SEEK MEDICAL CARE IF:   You have pain even after taking pain medicine.  You have not had a bowel movement in 3 days.  You have cramps or are nauseous. SEEK IMMEDIATE MEDICAL CARE IF:   You have pain or swelling that is getting worse.  You have redness around your incisions.  Your incision is pulling apart.  You have blood or fluid leaking from your incisions.  You are vomiting.  You cannot pass urine. MAKE SURE YOU:   Understand these instructions.  Will watch your condition.  Will get help right away if you are not doing well or get worse.   This information is not intended to replace advice given to you by your health care provider. Make sure you discuss any questions you have with your health care provider.   Document Released: 10/19/2012 Document Revised: 11/23/2014 Document Reviewed: 10/19/2012 Elsevier Interactive Patient Education 2016 [place]   1) The drugs that you were given will stay in your system until tomorrow so for the next 24 hours you should not:  A) Drive an automobile B) Make any legal decisions C) Drink any alcoholic beverage   2) You may resume regular meals tomorrow.  Today it is better to start with liquids and gradually work up to solid foods.  You may eat anything you prefer, but it is better to start with liquids, then soup and crackers, and gradually work up to solid foods.   3) Please notify your doctor immediately if you have any unusual bleeding, trouble breathing, redness and pain  at the surgery site, drainage, fever, or pain not relieved by medication.   Please contact your physician with any problems or Same Day Surgery at [phone], Monday through Friday 6 am to 4 pm, or [place] [facility] number at [phone].

## 2016-04-29 NOTE — Anesthesia Postprocedure Evaluation (Signed)
Anesthesia Post Note  Patient: Chelsey [name]  Procedure(s) Performed: Procedure(s) (LRB): Laparoscopic HERNIA REPAIR VENTRAL ADULT (N/A)  Patient location during evaluation: PACU Anesthesia Type: General Level of consciousness: awake and alert Pain management: pain level controlled Vital Signs Assessment: post-procedure vital signs reviewed and stable Respiratory status: spontaneous breathing, nonlabored ventilation, respiratory function stable and patient connected to nasal cannula oxygen Cardiovascular status: blood pressure returned to baseline and stable Postop Assessment: no signs of nausea or vomiting Anesthetic complications: no    Last Vitals:  Filed Vitals:   04/29/16 1426 04/29/16 1445  BP: 128/82 114/93  Pulse: 80 81  Temp: 35.9 C   Resp: 16 12    Last Pain:  Filed Vitals:   04/29/16 1452  PainSc: 8                  Emeli Goguen K Niasia Lanphear

## 2016-04-29 NOTE — Anesthesia Preprocedure Evaluation (Signed)
Anesthesia Evaluation  Patient identified by MRN, date of birth, ID band Patient awake    Reviewed: Allergy & Precautions, H&P , NPO status , Patient's Chart, lab work & pertinent test results  History of Anesthesia Complications Negative for: history of anesthetic complications  Airway Mallampati: III  TM Distance: >3 FB Neck ROM: limited    Dental  (+) Poor Dentition, Upper Dentures, Lower Dentures, Missing   Pulmonary neg shortness of breath, asthma , sleep apnea, Continuous Positive Airway Pressure Ventilation and Oxygen sleep apnea , COPD, former smoker,    Pulmonary exam normal breath sounds clear to auscultation       Cardiovascular Exercise Tolerance: Good hypertension, (-) angina+ Peripheral Vascular Disease  (-) Past MI and (-) DOE Normal cardiovascular exam Rhythm:regular Rate:Normal     Neuro/Psych Seizures -, Well Controlled,  negative psych ROS   GI/Hepatic Neg liver ROS, GERD  Controlled,  Endo/Other  diabetes, Type 2, Insulin DependentHypothyroidism   Renal/GU negative Renal ROS  negative genitourinary   Musculoskeletal  (+) Arthritis ,   Abdominal   Peds  Hematology negative hematology ROS (+)   Anesthesia Other Findings Past Medical History:   Asthma                                                       Hypertension                                                 Diabetes mellitus without complication (H[facility])                 Collagen vascular disease ([place])                              Aneurysm ([place])                                               Gout                                                         Sarcoidosis of lung (HCC)                                    Hypothyroidism                                               GERD (gastroesophageal reflux disease)                       Anemia  Peripheral vascular disease (HCC)                             Sleep apnea                                                    Comment:OXYGEN AT NIGHT 3 L [place]   COPD (chronic obstructive pulmonary disease) (*              Seizures (HCC)                                                 Comment:WITH BRAIN 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    Arthritis                                                      Comment:RHEUMATOID ARTHRITIS  Past Surgical History:   BRAIN SURGERY                                                 CEREBRAL ANEURYSM REPAIR                                      HERNIA REPAIR                                    2000           Comment:ventral   VEIN BYPASS SURGERY                                           STENT PLACEMENT VASCULAR ([facility] HX)                            EYE SURGERY                                                     Comment:bilateral cataract  BMI    Body Mass Index   34.99 kg/m 2      Reproductive/Obstetrics negative OB ROS                             Anesthesia Physical Anesthesia Plan  ASA: III  Anesthesia Plan: General ETT   Post-op Pain Management:    Induction:   Airway Management Planned:   Additional Equipment:   Intra-op Plan:   Post-operative Plan:   Informed Consent: I have  reviewed the patients History and Physical, chart, labs and discussed the procedure including the risks, benefits and alternatives for the proposed anesthesia with the patient or authorized representative who has indicated his/her understanding and acceptance.   Dental Advisory Given  Plan Discussed with: Anesthesiologist, CRNA and Surgeon  Anesthesia Plan Comments:         Anesthesia Quick Evaluation

## 2016-04-30 ENCOUNTER — Encounter: Payer: Self-pay | Admitting: Surgery

## 2016-04-30 LAB — SURGICAL PATHOLOGY

## 2016-05-01 NOTE — Progress Notes (Signed)
Dispensed diabetic shoes and 3 pairs of insoles. Instructions were reviewed and a copy was given to the patient. Patient to reappointment for regularly scheduled diabetic foot care visits or if she experiences any trouble with her diabetic shoes.

## 2016-05-06 ENCOUNTER — Telehealth: Payer: Self-pay | Admitting: Surgery

## 2016-05-06 DIAGNOSIS — R1030 Lower abdominal pain, unspecified: Secondary | ICD-10-CM

## 2016-05-06 NOTE — Telephone Encounter (Signed)
Patient had surgery last week and feels it is infected. She didn't take her temperature but was hot and sweaty, couldn't sleep. She can't see if it is red or swollen due to the tape on it. No vomiting. She is hurting. If she isn't home please call [phone]

## 2016-05-06 NOTE — Telephone Encounter (Signed)
LVM on home number and number provided this morning. No answer.

## 2016-05-06 NOTE — Telephone Encounter (Signed)
I spoke with patient and she denies redness, swelling, fever. However she stated she had pain with urination last night and she was hot and sweaty and had a restless night.She had some diarrhea the past 2 days.   Patient was asked to go to the [place] to give a urine specimen and i was told that she maybe able to this tomorrow as she did not have transportation to go anywhere. She was encouraged to go today if possible incase she did have UTI so we could begin treatment.     Order for U/A and CX has been placed.

## 2016-05-12 ENCOUNTER — Encounter: Payer: Self-pay | Admitting: Sports Medicine

## 2016-05-12 ENCOUNTER — Ambulatory Visit (INDEPENDENT_AMBULATORY_CARE_PROVIDER_SITE_OTHER): Payer: Medicare Other | Admitting: Sports Medicine

## 2016-05-12 DIAGNOSIS — B351 Tinea unguium: Secondary | ICD-10-CM | POA: Diagnosis not present

## 2016-05-12 DIAGNOSIS — Q828 Other specified congenital malformations of skin: Secondary | ICD-10-CM | POA: Diagnosis not present

## 2016-05-12 DIAGNOSIS — M79676 Pain in unspecified toe(s): Secondary | ICD-10-CM | POA: Diagnosis not present

## 2016-05-12 DIAGNOSIS — E1142 Type 2 diabetes mellitus with diabetic polyneuropathy: Secondary | ICD-10-CM | POA: Diagnosis not present

## 2016-05-12 NOTE — Progress Notes (Signed)
Patient ID: [name], female   DOB: 1951/05/27, 65 y.o.   MRN: [id number]  Subjective: Chelsey [name] is a 65 y.o. female patient with history of type 2 diabetes who presents to office today complaining callus and  long, painful nails  while ambulating in shoes; unable to trim. Patient states that the glucose reading this morning was 180 mg/dl. Patient denies any new changes in medication or new problems. Admits that she had hernia surgery since last visit and is still recovering. Patient denies any new cramping, numbness, burning or tingling in the legs or any other concerns.  Patient Active Problem List   Diagnosis Date Noted  . Recurrent ventral hernia   . Ventral hernia without obstruction or gangrene 03/25/2016  . Foot pain 12/10/2015  . Arthritis due to gout 12/10/2015  . Congenital flat foot 12/10/2015  . Pulmonary sarcoidosis ([place]) 12/10/2015  . Sarcoidosis, nodular type ([place]) 12/10/2015  . Chronic gouty arthritis 08/05/2015  . Breathing-related sleep disorder 04/29/2015  . Fatty liver disease, nonalcoholic 87/86/7672  . Acquired atrophy of thyroid 07/11/2014  . Type 2 diabetes mellitus ([place]) 09/05/2013  . Idiopathic localized osteoarthropathy 10/21/2012  . Sarcoidosis ([place]) 05/24/2008  . Peripheral vascular disease ([place]) 02/15/2007  . Essential (primary) hypertension 07/03/1999   Current Outpatient Prescriptions on File Prior to Visit  Medication Sig Dispense Refill  . albuterol (PROAIR HFA) 108 (90 BASE) MCG/ACT inhaler Inhale 2 puffs into the lungs every 6 (six) hours as needed. For wheezing or shortness of breath.    . allopurinol (ZYLOPRIM) 300 MG tablet Take 300 mg by mouth daily.    Marland Kitchen amLODipine (NORVASC) 10 MG tablet Take 10 mg by mouth every morning.     Marland Kitchen aspirin EC 81 MG tablet Take 81 mg by mouth daily.    . Blood Glucose Calibration (TAI DOC CONTROL) NORMAL SOLN     . Blood Glucose Monitoring Suppl (ACCU-CHEK AVIVA PLUS) W/DEVICE KIT Use as directed.    Marland Kitchen  COLCRYS 0.6 MG tablet Take 0.6 mg by mouth 2 (two) times daily as needed (for gout flares.).     Marland Kitchen ferrous sulfate 325 (65 FE) MG tablet Take 325 mg by mouth 2 (two) times daily.    . fluticasone (FLONASE) 50 MCG/ACT nasal spray Place 2 sprays into both nostrils daily as needed. For rhinitis and allergies.    . Fluticasone-Salmeterol (ADVAIR DISKUS) 250-50 MCG/DOSE AEPB Take 2 puffs by mouth.    . furosemide (LASIX) 40 MG tablet Take 40 mg by mouth daily.     Marland Kitchen gabapentin (NEURONTIN) 300 MG capsule Take 300 mg by mouth 3 (three) times daily.    Marland Kitchen glucose blood test strip 1 each daily. Use 1 test strip daily.    Marland Kitchen HYDROcodone-acetaminophen (NORCO) 7.5-325 MG tablet Take 1-2 tablets by mouth every 4 (four) hours as needed for moderate pain. 40 tablet 0  . hydroxychloroquine (PLAQUENIL) 200 MG tablet Take 200 mg by mouth 2 (two) times daily.    . insulin lispro (HUMALOG KWIKPEN) 100 UNIT/ML KiwkPen Inject 20 Units into the skin 3 (three) times daily before meals.    Marland Kitchen LANCETS ULTRA THIN MISC Apply 1 each topically daily.     Marland Kitchen LANTUS 100 UNIT/ML injection Inject 35 Units into the skin 2 (two) times daily.     Marland Kitchen levothyroxine (SYNTHROID, LEVOTHROID) 88 MCG tablet Take 88 mcg by mouth daily before breakfast.     . lisinopril (PRINIVIL,ZESTRIL) 40 MG tablet Take 40 mg by mouth every  morning.     . metFORMIN (GLUCOPHAGE) 1000 MG tablet 1,000 mg 2 (two) times daily with a meal.     . omeprazole (PRILOSEC) 20 MG capsule Take 20 mg by mouth every morning.     . polyethylene glycol (MIRALAX / GLYCOLAX) packet Take 17 g by mouth daily.    . simvastatin (ZOCOR) 20 MG tablet Take 20 mg by mouth daily with lunch.      No current facility-administered medications on file prior to visit.   Allergies  Allergen Reactions  . Oxycodone-Acetaminophen Other (See Comments)    Hallucinations HALLUCINATIONS  . Indomethacin Other (See Comments)    BURN HOLE IN STOMACH  . Percocet [Oxycodone-Acetaminophen] Other  (See Comments)    HALLUCINATIONS   . Penicillins Rash   Labs: HEMOGLOBIN A1C- No recent lab on file  Objective: General: Patient is awake, alert, and oriented x 3 and in no acute distress.  Integument: Skin is warm, dry and supple bilateral. Nails are tender, long, thickened and  dystrophic with subungual debris, consistent with onychomycosis, 1-5 bilateral. No signs of infection. No open lesions bilateral. Mild callus with nucleated core at left hallux with no signs of infection. Remaining integument unremarkable.  Vasculature:  Dorsalis Pedis pulse 2/4 bilateral. Posterior Tibial pulse  1/4 bilateral.  Capillary fill time <3 sec 1-5 bilateral. Positive hair growth to the level of the digits. Temperature gradient within normal limits. No varicosities present bilateral. No edema present bilateral.   Neurology: The patient has intact sensation measured with a 5.07/10g Semmes Weinstein Monofilament at all pedal sites bilateral . Vibratory sensation diminished bilateral with tuning fork. No Babinski sign present bilateral.   Musculoskeletal: No gross pedal deformities noted bilateral. Muscular strength 5/5 in all lower extremity muscular groups bilateral without pain or limitation on range of motion . No tenderness with calf compression bilateral.  Assessment and Plan: Problem List Items Addressed This Visit    None    Visit Diagnoses    Diabetic polyneuropathy associated with type 2 diabetes mellitus (HCC)    -  Primary    Dermatophytosis of nail        Porokeratosis        Pain of toe, unspecified laterality          -Examined patient. -Discussed and educated patient on diabetic foot care, especially with  regards to the vascular, neurological and musculoskeletal systems.  -Stressed the importance of good glycemic control and the detriment of not  controlling glucose levels in relation to the foot. -Mechanically debrided porokeratosis x 1 using sterile chisel blade and all nails  1-5 bilateral using sterile nail nipper and filed with dremel without incident  -Continue with good supportive shoes daily  -Answered all patient questions -Patient to return  in 3 months for at risk foot care -Patient advised to call the office if any problems or questions arise in the meantime.  Landis Martins, DPM

## 2016-05-21 ENCOUNTER — Telehealth: Payer: Self-pay

## 2016-05-21 ENCOUNTER — Ambulatory Visit (INDEPENDENT_AMBULATORY_CARE_PROVIDER_SITE_OTHER): Payer: Medicare Other | Admitting: Surgery

## 2016-05-21 ENCOUNTER — Encounter: Payer: Self-pay | Admitting: Surgery

## 2016-05-21 VITALS — BP 144/82 | HR 64 | Temp 97.9°F | Ht 63.0 in | Wt 202.0 lb

## 2016-05-21 DIAGNOSIS — Z09 Encounter for follow-up examination after completed treatment for conditions other than malignant neoplasm: Secondary | ICD-10-CM

## 2016-05-21 NOTE — Progress Notes (Signed)
Chelsey [name] is S/p lap repair of recurrent ventral hernia w mesh Doing Fantastic No complaints Taking PO and no pain  PE NAD Abd: soft, NT, incision c/d/i, no recurrence  A/P doing very well No heavy lifting RTC prn

## 2016-05-21 NOTE — Telephone Encounter (Signed)
Patient did not go to the lab,however was seen in the office today.

## 2016-05-21 NOTE — Patient Instructions (Signed)
Please call with any questions or concerns.

## 2016-06-03 ENCOUNTER — Encounter: Payer: Self-pay | Admitting: *Deleted

## 2016-06-11 ENCOUNTER — Ambulatory Visit: Payer: Medicare Other | Admitting: Anesthesiology

## 2016-06-11 ENCOUNTER — Encounter
Admission: RE | Disposition: A | Discharge status: Home / Self Care | Payer: Self-pay | Source: Ambulatory Visit | Attending: Ophthalmology

## 2016-06-11 ENCOUNTER — Ambulatory Visit
Admission: RE | Admit: 2016-06-11 | Discharge: 2016-06-11 | Disposition: A | Discharge status: Home / Self Care | Payer: Medicare Other | Source: Ambulatory Visit | Attending: Ophthalmology | Admitting: Ophthalmology

## 2016-06-11 DIAGNOSIS — M7989 Other specified soft tissue disorders: Secondary | ICD-10-CM | POA: Insufficient documentation

## 2016-06-11 DIAGNOSIS — Z6833 Body mass index (BMI) 33.0-33.9, adult: Secondary | ICD-10-CM | POA: Insufficient documentation

## 2016-06-11 DIAGNOSIS — G4730 Sleep apnea, unspecified: Secondary | ICD-10-CM | POA: Diagnosis not present

## 2016-06-11 DIAGNOSIS — K219 Gastro-esophageal reflux disease without esophagitis: Secondary | ICD-10-CM | POA: Diagnosis not present

## 2016-06-11 DIAGNOSIS — Z9981 Dependence on supplemental oxygen: Secondary | ICD-10-CM | POA: Diagnosis not present

## 2016-06-11 DIAGNOSIS — E1136 Type 2 diabetes mellitus with diabetic cataract: Secondary | ICD-10-CM | POA: Insufficient documentation

## 2016-06-11 DIAGNOSIS — Z951 Presence of aortocoronary bypass graft: Secondary | ICD-10-CM | POA: Insufficient documentation

## 2016-06-11 DIAGNOSIS — Z885 Allergy status to narcotic agent status: Secondary | ICD-10-CM | POA: Insufficient documentation

## 2016-06-11 DIAGNOSIS — E1140 Type 2 diabetes mellitus with diabetic neuropathy, unspecified: Secondary | ICD-10-CM | POA: Insufficient documentation

## 2016-06-11 DIAGNOSIS — M109 Gout, unspecified: Secondary | ICD-10-CM | POA: Diagnosis not present

## 2016-06-11 DIAGNOSIS — I10 Essential (primary) hypertension: Secondary | ICD-10-CM | POA: Diagnosis not present

## 2016-06-11 DIAGNOSIS — I729 Aneurysm of unspecified site: Secondary | ICD-10-CM | POA: Insufficient documentation

## 2016-06-11 DIAGNOSIS — H2512 Age-related nuclear cataract, left eye: Secondary | ICD-10-CM | POA: Diagnosis present

## 2016-06-11 DIAGNOSIS — K769 Liver disease, unspecified: Secondary | ICD-10-CM | POA: Insufficient documentation

## 2016-06-11 DIAGNOSIS — D649 Anemia, unspecified: Secondary | ICD-10-CM | POA: Diagnosis not present

## 2016-06-11 DIAGNOSIS — E7800 Pure hypercholesterolemia, unspecified: Secondary | ICD-10-CM | POA: Diagnosis not present

## 2016-06-11 DIAGNOSIS — K449 Diaphragmatic hernia without obstruction or gangrene: Secondary | ICD-10-CM | POA: Diagnosis not present

## 2016-06-11 DIAGNOSIS — J449 Chronic obstructive pulmonary disease, unspecified: Secondary | ICD-10-CM | POA: Diagnosis not present

## 2016-06-11 DIAGNOSIS — I25119 Atherosclerotic heart disease of native coronary artery with unspecified angina pectoris: Secondary | ICD-10-CM | POA: Insufficient documentation

## 2016-06-11 HISTORY — DX: Polyneuropathy, unspecified: G62.9

## 2016-06-11 HISTORY — DX: Atherosclerotic heart disease of native coronary artery without angina pectoris: I25.10

## 2016-06-11 HISTORY — DX: Edema, unspecified: R60.9

## 2016-06-11 HISTORY — DX: Personal history of other specified conditions: Z87.898

## 2016-06-11 HISTORY — DX: Personal history of other diseases of the digestive system: Z87.19

## 2016-06-11 HISTORY — DX: Hypoxemia: R09.02

## 2016-06-11 HISTORY — PX: CATARACT EXTRACTION W/PHACO: SHX586

## 2016-06-11 LAB — GLUCOSE, CAPILLARY: GLUCOSE-CAPILLARY: 231 mg/dL — AB (ref 65–99)

## 2016-06-11 SURGERY — PHACOEMULSIFICATION, CATARACT, WITH IOL INSERTION
Anesthesia: Monitor Anesthesia Care | Site: Eye | Laterality: Left | Wound class: Clean

## 2016-06-11 MED ORDER — NA CHONDROIT SULF-NA HYALURON 40-17 MG/ML IO SOLN
INTRAOCULAR | Status: AC
  Filled 2016-06-11: qty 1

## 2016-06-11 MED ORDER — EPINEPHRINE HCL 1 MG/ML IJ SOLN
INTRAMUSCULAR | Status: AC
  Filled 2016-06-11: qty 1

## 2016-06-11 MED ORDER — TETRACAINE HCL 0.5 % OP SOLN
OPHTHALMIC | Status: AC
  Administered 2016-06-11: 1 [drp] via OPHTHALMIC
  Filled 2016-06-11: qty 2

## 2016-06-11 MED ORDER — EPINEPHRINE HCL 1 MG/ML IJ SOLN
INTRAMUSCULAR | Status: DC | PRN
  Administered 2016-06-11: 07:00:00 via OPHTHALMIC

## 2016-06-11 MED ORDER — POVIDONE-IODINE 5 % OP SOLN
1.0000 "application " | Freq: Once | OPHTHALMIC | Status: AC
  Administered 2016-06-11: 1 via OPHTHALMIC

## 2016-06-11 MED ORDER — ARMC OPHTHALMIC DILATING GEL
OPHTHALMIC | Status: AC
  Administered 2016-06-11: 1 via OPHTHALMIC
  Filled 2016-06-11: qty 0.25

## 2016-06-11 MED ORDER — SODIUM CHLORIDE 0.9 % IV SOLN
INTRAVENOUS | Status: DC
  Administered 2016-06-11: 06:00:00 via INTRAVENOUS

## 2016-06-11 MED ORDER — TETRACAINE HCL 0.5 % OP SOLN
1.0000 [drp] | Freq: Once | OPHTHALMIC | Status: AC
  Administered 2016-06-11: 1 [drp] via OPHTHALMIC

## 2016-06-11 MED ORDER — POVIDONE-IODINE 5 % OP SOLN
OPHTHALMIC | Status: AC
  Administered 2016-06-11: 1 via OPHTHALMIC
  Filled 2016-06-11: qty 30

## 2016-06-11 MED ORDER — MOXIFLOXACIN HCL 0.5 % OP SOLN
OPHTHALMIC | Status: AC
  Filled 2016-06-11: qty 3

## 2016-06-11 MED ORDER — CEFUROXIME OPHTHALMIC INJECTION 1 MG/0.1 ML
INJECTION | OPHTHALMIC | Status: AC
  Filled 2016-06-11: qty 0.1

## 2016-06-11 MED ORDER — FENTANYL CITRATE (PF) 100 MCG/2ML IJ SOLN
INTRAMUSCULAR | Status: DC | PRN
  Administered 2016-06-11: 50 ug via INTRAVENOUS

## 2016-06-11 MED ORDER — NA CHONDROIT SULF-NA HYALURON 40-17 MG/ML IO SOLN
INTRAOCULAR | Status: DC | PRN
  Administered 2016-06-11: 1 mL via INTRAOCULAR

## 2016-06-11 MED ORDER — MOXIFLOXACIN HCL 0.5 % OP SOLN
OPHTHALMIC | Status: DC | PRN
  Administered 2016-06-11: 1 [drp] via OPHTHALMIC

## 2016-06-11 MED ORDER — MIDAZOLAM HCL 2 MG/2ML IJ SOLN
INTRAMUSCULAR | Status: DC | PRN
  Administered 2016-06-11: 1 mg via INTRAVENOUS

## 2016-06-11 MED ORDER — CARBACHOL 0.01 % IO SOLN
INTRAOCULAR | Status: DC | PRN
  Administered 2016-06-11: 0.5 mL via INTRAOCULAR

## 2016-06-11 MED ORDER — MOXIFLOXACIN HCL 0.5 % OP SOLN: 1.0000 [drp] | OPHTHALMIC | Status: DC | PRN

## 2016-06-11 MED ORDER — ARMC OPHTHALMIC DILATING GEL
1.0000 "application " | OPHTHALMIC | Status: AC | PRN
  Administered 2016-06-11 (×2): 1 via OPHTHALMIC

## 2016-06-11 MED ORDER — BUPIVACAINE HCL (PF) 0.5 % IJ SOLN
INTRAMUSCULAR | Status: DC | PRN
  Administered 2016-06-11: 2.8 mL

## 2016-06-11 SURGICAL SUPPLY — 21 items
CANNULA ANT/CHMB 27GA (MISCELLANEOUS) ×3 IMPLANT
CUP MEDICINE 2OZ PLAST GRAD ST (MISCELLANEOUS) ×3 IMPLANT
GLOVE BIO SURGEON STRL SZ8 (GLOVE) ×3 IMPLANT
GLOVE BIOGEL M 6.5 STRL (GLOVE) ×3 IMPLANT
GLOVE SURG LX 8.0 MICRO (GLOVE) ×2
GLOVE SURG LX STRL 8.0 MICRO (GLOVE) ×1 IMPLANT
GOWN STRL REUS W/ TWL LRG LVL3 (GOWN DISPOSABLE) ×2 IMPLANT
GOWN STRL REUS W/TWL LRG LVL3 (GOWN DISPOSABLE) ×6
LENS IOL TECNIS ITEC 21.0 (Intraocular Lens) ×3 IMPLANT
PACK CATARACT (MISCELLANEOUS) ×3 IMPLANT
PACK CATARACT BRASINGTON LX (MISCELLANEOUS) ×3 IMPLANT
PACK EYE AFTER SURG (MISCELLANEOUS) ×3 IMPLANT
SOL BSS BAG (MISCELLANEOUS) ×3
SOL PREP PVP 2OZ (MISCELLANEOUS) ×3
SOLUTION BSS BAG (MISCELLANEOUS) ×1 IMPLANT
SOLUTION PREP PVP 2OZ (MISCELLANEOUS) ×1 IMPLANT
SYR 3ML LL SCALE MARK (SYRINGE) ×3 IMPLANT
SYR 5ML LL (SYRINGE) ×3 IMPLANT
SYR TB 1ML 27GX1/2 LL (SYRINGE) ×3 IMPLANT
WATER STERILE IRR 1000ML POUR (IV SOLUTION) ×3 IMPLANT
WIPE NON LINTING 3.25X3.25 (MISCELLANEOUS) ×3 IMPLANT

## 2016-06-11 NOTE — Anesthesia Preprocedure Evaluation (Signed)
Anesthesia Evaluation  Patient identified by MRN, date of birth, ID band Patient awake    Reviewed: Allergy & Precautions, NPO status , Patient's Chart, lab work & pertinent test results  Airway Mallampati: II       Dental  (+) Upper Dentures, Lower Dentures   Pulmonary sleep apnea and Continuous Positive Airway Pressure Ventilation , COPD, former smoker,     + decreased breath sounds      Cardiovascular hypertension, + angina + CAD   Rhythm:Regular     Neuro/Psych Seizures -,  Hx of cerebral aneurysm, clipped    GI/Hepatic Neg liver ROS, hiatal hernia, GERD  ,  Endo/Other  diabetes, Type 2, Oral Hypoglycemic AgentsHypothyroidism Morbid obesity  Renal/GU      Musculoskeletal   Abdominal (+) + obese,   Peds  Hematology  (+) anemia ,   Anesthesia Other Findings   Reproductive/Obstetrics                             Anesthesia Physical Anesthesia Plan  ASA: III  Anesthesia Plan: MAC   Post-op Pain Management:    Induction: Intravenous  Airway Management Planned: Natural Airway and Nasal Cannula  Additional Equipment:   Intra-op Plan:   Post-operative Plan:   Informed Consent: I have reviewed the patients History and Physical, chart, labs and discussed the procedure including the risks, benefits and alternatives for the proposed anesthesia with the patient or authorized representative who has indicated his/her understanding and acceptance.     Plan Discussed with: CRNA  Anesthesia Plan Comments:         Anesthesia Quick Evaluation

## 2016-06-11 NOTE — Discharge Instructions (Signed)
Eye Surgery Discharge Instructions  Expect mild scratchy sensation or mild soreness. DO NOT RUB YOUR EYE!  The day of surgery:  Minimal physical activity, but bed rest is not required  No reading, computer work, or close hand work  No bending, lifting, or straining.  May watch TV  For 24 hours:  No driving, legal decisions, or alcoholic beverages  Safety precautions  Eat anything you prefer: It is better to start with liquids, then soup then solid foods.  _____ Eye patch should be worn until postoperative exam tomorrow.  ____ Solar shield eyeglasses should be worn for comfort in the sunlight/patch while sleeping  Resume all regular medications including aspirin or Coumadin if these were discontinued prior to surgery. You may shower, bathe, shave, or wash your hair. Tylenol may be taken for mild discomfort.  Call your doctor if you experience significant pain, nausea, or vomiting, fever > 101 or other signs of infection. [phone] or [phone] Specific instructions:  Follow-up Information    Tim Lair, MD .   Specialty:  Ophthalmology Why:  06-12-16 at 9:00 Contact information: [address] [place] [address] [phone]          Eye Surgery Discharge Instructions  Expect mild scratchy sensation or mild soreness. DO NOT RUB YOUR EYE!  The day of surgery:  Minimal physical activity, but bed rest is not required  No reading, computer work, or close hand work  No bending, lifting, or straining.  May watch TV  For 24 hours:  No driving, legal decisions, or alcoholic beverages  Safety precautions  Eat anything you prefer: It is better to start with liquids, then soup then solid foods.  _____ Eye patch should be worn until postoperative exam tomorrow.  ____ Solar shield eyeglasses should be worn for comfort in the sunlight/patch while sleeping  Resume all regular medications including aspirin or Coumadin if these were  discontinued prior to surgery. You may shower, bathe, shave, or wash your hair. Tylenol may be taken for mild discomfort.  Call your doctor if you experience significant pain, nausea, or vomiting, fever > 101 or other signs of infection. [phone] or [phone] Specific instructions:  Follow-up Information    Tim Lair, MD .   Specialty:  Ophthalmology Why:  06-12-16 at 9:00 Contact information: [place] [address] [phone]

## 2016-06-11 NOTE — Transfer of Care (Signed)
Immediate Anesthesia Transfer of Care Note  Patient: Penelope Galas  Procedure(s) Performed: Procedure(s) with comments: CATARACT EXTRACTION PHACO AND INTRAOCULAR LENS PLACEMENT (IOC) (Left) - [place] 1.01 AP% 21.6 CDE 13.23 Fluid pack lot # [id number]  Patient Location: PACU and Short Stay  Anesthesia Type:MAC  Level of Consciousness: awake, alert  and oriented  Airway & Oxygen Therapy: Patient Spontanous Breathing  Post-op Assessment: Report given to RN and Post -op Vital signs reviewed and stable  Post vital signs: Reviewed and stable  Last Vitals:  Vitals:   06/11/16 0614 06/11/16 0746  BP: 137/68 (!) 118/59  Pulse: 71   Resp: 18 16  Temp: 36.2 C (!) 36 C    Last Pain:  Vitals:   06/11/16 0746  TempSrc: Tympanic         Complications: No apparent anesthesia complications

## 2016-06-11 NOTE — Anesthesia Procedure Notes (Signed)
Spinal  Patient location during procedure: OR Start time: 06/11/2016 7:28 AM End time: 06/11/2016 7:35 AM Staffing Anesthesiologist: Marline Backbone F Performed: anesthesiologist  Preanesthetic Checklist Completed: patient identified, site marked, surgical consent, pre-op evaluation, timeout performed, IV checked, risks and benefits discussed and monitors and equipment checked Spinal Block Patient position: sitting Prep: Betadine Patient monitoring: heart rate and blood pressure Approach: midline Location: L3-4 Injection technique: single-shot Needle Needle type: Quincke  Needle gauge: 22 G Needle length: 9 cm Needle insertion depth: 6 cm Assessment Sensory level: T8

## 2016-06-11 NOTE — Anesthesia Postprocedure Evaluation (Signed)
Anesthesia Post Note  Patient: Chelsey [name]  Procedure(s) Performed: Procedure(s) (LRB): CATARACT EXTRACTION PHACO AND INTRAOCULAR LENS PLACEMENT (IOC) (Left)  Patient location during evaluation: Short Stay Anesthesia Type: MAC Level of consciousness: awake, awake and alert and oriented Pain management: pain level controlled Vital Signs Assessment: post-procedure vital signs reviewed and stable Respiratory status: spontaneous breathing and nonlabored ventilation Cardiovascular status: stable Postop Assessment: no signs of nausea or vomiting Anesthetic complications: no    Last Vitals:  Vitals:   06/11/16 0614 06/11/16 0746  BP: 137/68 (!) 118/59  Pulse: 71   Resp: 18 16  Temp: 36.2 C (!) 36 C    Last Pain:  Vitals:   06/11/16 0746  TempSrc: Tympanic                 Lanora Manis

## 2016-06-11 NOTE — H&P (Signed)
  All labs reviewed. Abnormal studies sent to patients PCP when indicated.  Previous H&P reviewed, patient examined, there are NO CHANGES.  Chelsey [name] LOUIS7/27/20177:18 AM

## 2016-06-11 NOTE — OR Nursing (Signed)
Verified medication ordered and allergies with both pharmacy and dr George Ina.

## 2016-06-11 NOTE — Op Note (Signed)
PREOPERATIVE DIAGNOSIS:  Nuclear sclerotic cataract of the left eye.   POSTOPERATIVE DIAGNOSIS:  Nuclear sclerotic cataract of the left eye.   OPERATIVE PROCEDURE: Procedure(s): CATARACT EXTRACTION PHACO AND INTRAOCULAR LENS PLACEMENT (IOC)   SURGEON:  Birder Robson, MD.   ANESTHESIA:  Anesthesiologist: Gijsbertus Lonia Mad, MD CRNA: Jonna Clark, CRNA  1.      Managed anesthesia care. 2.      Topical tetracaine drops followed by 2% Xylocaine jelly applied in the preoperative holding area.   COMPLICATIONS:  None.   TECHNIQUE:   Stop and chop   DESCRIPTION OF PROCEDURE:  The patient was examined and consented in the preoperative holding area where the aforementioned topical anesthesia was applied to the left eye and then brought back to the Operating Room where the left eye was prepped and draped in the usual sterile ophthalmic fashion and a lid speculum was placed. A paracentesis was created with the side port blade and the anterior chamber was filled with viscoelastic. A near clear corneal incision was performed with the steel keratome. A continuous curvilinear capsulorrhexis was performed with a cystotome followed by the capsulorrhexis forceps. Hydrodissection and hydrodelineation were carried out with BSS on a blunt cannula. The lens was removed in a stop and chop  technique and the remaining cortical material was removed with the irrigation-aspiration handpiece. The capsular bag was inflated with viscoelastic and the Technis [id number] lens was placed in the capsular bag without complication. The remaining viscoelastic was removed from the eye with the irrigation-aspiration handpiece. The wounds were hydrated. The anterior chamber was flushed with Miostat and the eye was inflated to physiologic pressure. 0.2 mL of Vigamox diluted three/one with BSS was placed in the anterior chamber. The wounds were found to be water tight. The eye was dressed with Vigamox. The patient was given  protective glasses to wear throughout the day and a shield with which to sleep tonight. The patient was also given drops with which to begin a drop regimen today and will follow-up with me in one day.  Implant Name Type Inv. Item Serial No. Manufacturer Lot No. LRB No. Used  LENS IOL DIOP 21.0 - [id number] Intraocular Lens LENS IOL DIOP 21.0 [id number]   Left 1    Procedure(s) with comments: CATARACT EXTRACTION PHACO AND INTRAOCULAR LENS PLACEMENT (IOC) (Left) - [place] 1.01 AP% 21.6 CDE 13.23 Fluid pack lot # [id number]  Electronically signed: [place] 06/11/2016 7:44 AM

## 2016-07-24 ENCOUNTER — Ambulatory Visit: Payer: Medicare Other | Admitting: Podiatry

## 2016-07-28 ENCOUNTER — Ambulatory Visit: Payer: Medicare Other | Admitting: Podiatry

## 2016-07-29 DIAGNOSIS — E785 Hyperlipidemia, unspecified: Secondary | ICD-10-CM | POA: Insufficient documentation

## 2016-07-29 DIAGNOSIS — E1169 Type 2 diabetes mellitus with other specified complication: Secondary | ICD-10-CM | POA: Insufficient documentation

## 2016-08-04 ENCOUNTER — Ambulatory Visit: Payer: Medicare Other | Admitting: Podiatry

## 2016-08-21 ENCOUNTER — Ambulatory Visit (INDEPENDENT_AMBULATORY_CARE_PROVIDER_SITE_OTHER): Payer: Medicare Other | Admitting: Podiatry

## 2016-08-21 ENCOUNTER — Encounter: Payer: Self-pay | Admitting: Podiatry

## 2016-08-21 DIAGNOSIS — E0843 Diabetes mellitus due to underlying condition with diabetic autonomic (poly)neuropathy: Secondary | ICD-10-CM

## 2016-08-21 DIAGNOSIS — M79671 Pain in right foot: Secondary | ICD-10-CM | POA: Diagnosis not present

## 2016-08-21 DIAGNOSIS — B351 Tinea unguium: Secondary | ICD-10-CM | POA: Diagnosis not present

## 2016-08-21 DIAGNOSIS — L608 Other nail disorders: Secondary | ICD-10-CM

## 2016-08-21 DIAGNOSIS — L603 Nail dystrophy: Secondary | ICD-10-CM

## 2016-08-21 DIAGNOSIS — M79672 Pain in left foot: Secondary | ICD-10-CM | POA: Diagnosis not present

## 2016-08-21 DIAGNOSIS — L84 Corns and callosities: Secondary | ICD-10-CM | POA: Diagnosis not present

## 2016-08-21 DIAGNOSIS — M79609 Pain in unspecified limb: Secondary | ICD-10-CM

## 2016-08-21 DIAGNOSIS — L851 Acquired keratosis [keratoderma] palmaris et plantaris: Secondary | ICD-10-CM

## 2016-08-21 NOTE — Patient Instructions (Signed)
Diabetes and Foot Care Diabetes may cause you to have problems because of poor blood supply (circulation) to your feet and legs. This may cause the skin on your feet to become thinner, break easier, and heal more slowly. Your skin may become dry, and the skin may peel and crack. You may also have nerve damage in your legs and feet causing decreased feeling in them. You may not notice minor injuries to your feet that could lead to infections or more serious problems. Taking care of your feet is one of the most important things you can do for yourself.  HOME CARE INSTRUCTIONS  Wear shoes at all times, even in the house. Do not go barefoot. Bare feet are easily injured.  Check your feet daily for blisters, cuts, and redness. If you cannot see the bottom of your feet, use a mirror or ask someone for help.  Wash your feet with warm water (do not use hot water) and mild soap. Then pat your feet and the areas between your toes until they are completely dry. Do not soak your feet as this can dry your skin.  Apply a moisturizing lotion or petroleum jelly (that does not contain alcohol and is unscented) to the skin on your feet and to dry, brittle toenails. Do not apply lotion between your toes.  Trim your toenails straight across. Do not dig under them or around the cuticle. File the edges of your nails with an emery board or nail file.  Do not cut corns or calluses or try to remove them with medicine.  Wear clean socks or stockings every day. Make sure they are not too tight. Do not wear knee-high stockings since they may decrease blood flow to your legs.  Wear shoes that fit properly and have enough cushioning. To break in new shoes, wear them for just a few hours a day. This prevents you from injuring your feet. Always look in your shoes before you put them on to be sure there are no objects inside.  Do not cross your legs. This may decrease the blood flow to your feet.  If you find a minor scrape,  cut, or break in the skin on your feet, keep it and the skin around it clean and dry. These areas may be cleansed with mild soap and water. Do not cleanse the area with peroxide, alcohol, or iodine.  When you remove an adhesive bandage, be sure not to damage the skin around it.  If you have a wound, look at it several times a day to make sure it is healing.  Do not use heating pads or hot water bottles. They may burn your skin. If you have lost feeling in your feet or legs, you may not know it is happening until it is too late.  Make sure your health care provider performs a complete foot exam at least annually or more often if you have foot problems. Report any cuts, sores, or bruises to your health care provider immediately. SEEK MEDICAL CARE IF:   You have an injury that is not healing.  You have cuts or breaks in the skin.  You have an ingrown nail.  You notice redness on your legs or feet.  You feel burning or tingling in your legs or feet.  You have pain or cramps in your legs and feet.  Your legs or feet are numb.  Your feet always feel cold. SEEK IMMEDIATE MEDICAL CARE IF:   There is increasing redness,   swelling, or pain in or around a wound.  There is a red line that goes up your leg.  Pus is coming from a wound.  You develop a fever or as directed by your health care provider.  You notice a bad smell coming from an ulcer or wound.   This information is not intended to replace advice given to you by your health care provider. Make sure you discuss any questions you have with your health care provider.   Document Released: 10/30/2000 Document Revised: 07/05/2013 Document Reviewed: 04/11/2013 Elsevier Interactive Patient Education 2016 [employer]  

## 2016-08-21 NOTE — Progress Notes (Signed)
SUBJECTIVE Patient with a history of diabetes mellitus presents to office today complaining of elongated, thickened nails. Pain while ambulating in shoes. Patient is unable to trim their own nails.  Patient also complains of painful callus lesions to the left foot sub-first MPJ and great toe. Patient presents for further treatment and evaluation   OBJECTIVE General Patient is awake, alert, and oriented x 3 and in no acute distress. Derm painful hyperkeratotic lesions noted to the sub-first MPJ and great toe of the left foot. Sub-first MPJ presents with a central nucleated core consistent with a porokeratosis. Skin is dry and supple bilateral. Negative open lesions or macerations. Remaining integument unremarkable. Nails are tender, long, thickened and dystrophic with subungual debris, consistent with onychomycosis, 1-5 bilateral. No signs of infection noted. Vasc  DP and PT pedal pulses palpable bilaterally. Temperature gradient within normal limits.  Neuro Epicritic and protective threshold sensation diminished bilaterally.  Musculoskeletal Exam No symptomatic pedal deformities noted bilateral. Muscular strength within normal limits.  ASSESSMENT 1. Diabetes Mellitus w/ peripheral neuropathy 2. Onychomycosis of nail due to dermatophyte bilateral 3. Pain in foot bilateral 4. Painful callus lesions sub-first MPJ and great toe left foot  PLAN OF CARE 1. Patient evaluated today. 2. Instructed to maintain good pedal hygiene and foot care. Stressed importance of controlling blood sugar.  3. Mechanical debridement of nails 1-5 bilaterally performed using a nail nipper. Filed with dremel without incident.  4. Excisional debridement of the painful callus lesions was performed to the left foot. Using a chisel blade. 5. Return to clinic in 3 mos.    Edrick Kins, DPM

## 2016-11-03 DIAGNOSIS — G8929 Other chronic pain: Secondary | ICD-10-CM | POA: Insufficient documentation

## 2016-11-03 DIAGNOSIS — M25572 Pain in left ankle and joints of left foot: Secondary | ICD-10-CM

## 2016-11-03 DIAGNOSIS — N2889 Other specified disorders of kidney and ureter: Secondary | ICD-10-CM | POA: Insufficient documentation

## 2016-11-03 DIAGNOSIS — M25562 Pain in left knee: Secondary | ICD-10-CM

## 2016-11-24 ENCOUNTER — Ambulatory Visit (INDEPENDENT_AMBULATORY_CARE_PROVIDER_SITE_OTHER): Payer: Medicare Other | Admitting: Podiatry

## 2016-11-24 DIAGNOSIS — M659 Synovitis and tenosynovitis, unspecified: Secondary | ICD-10-CM

## 2016-11-24 DIAGNOSIS — L603 Nail dystrophy: Secondary | ICD-10-CM | POA: Diagnosis not present

## 2016-11-24 DIAGNOSIS — M25571 Pain in right ankle and joints of right foot: Secondary | ICD-10-CM

## 2016-11-24 DIAGNOSIS — M7751 Other enthesopathy of right foot: Secondary | ICD-10-CM | POA: Diagnosis not present

## 2016-11-24 DIAGNOSIS — M79609 Pain in unspecified limb: Secondary | ICD-10-CM | POA: Diagnosis not present

## 2016-11-24 DIAGNOSIS — B351 Tinea unguium: Secondary | ICD-10-CM | POA: Diagnosis not present

## 2016-11-24 DIAGNOSIS — L608 Other nail disorders: Secondary | ICD-10-CM | POA: Diagnosis not present

## 2016-11-24 DIAGNOSIS — E0843 Diabetes mellitus due to underlying condition with diabetic autonomic (poly)neuropathy: Secondary | ICD-10-CM | POA: Diagnosis not present

## 2016-11-29 MED ORDER — BETAMETHASONE SOD PHOS & ACET 6 (3-3) MG/ML IJ SUSP: 3.0000 mg | Freq: Once | INTRAMUSCULAR | Status: DC

## 2016-11-29 NOTE — Progress Notes (Signed)
SUBJECTIVE Patient with a history of diabetes mellitus presents to office today complaining of elongated, thickened nails. Pain while ambulating in shoes. Patient is unable to trim their own nails.  Patient also has a new complaint today of right lower extremity pain. Pain with ambulation. There are no alleviating factors exception of rest. Patient states that she received an injection at [facility] into her left ankle joint which helped significantly  Allergies  Allergen Reactions  . Oxycodone-Acetaminophen Other (See Comments)    Hallucinations HALLUCINATIONS  . Indomethacin Other (See Comments)    BURN HOLE IN STOMACH  . Penicillins Rash    OBJECTIVE General Patient is awake, alert, and oriented x 3 and in no acute distress. Derm Skin is dry and supple bilateral. Negative open lesions or macerations. Remaining integument unremarkable. Nails are tender, long, thickened and dystrophic with subungual debris, consistent with onychomycosis, 1-5 bilateral. No signs of infection noted. Vasc  DP and PT pedal pulses palpable bilaterally. Temperature gradient within normal limits.  Neuro Epicritic and protective threshold sensation diminished bilaterally.  Musculoskeletal Exam Pain on palpation noted to the medial anterior and lateral aspects of the patient's right ankle joint. No symptomatic pedal deformities noted bilateral. Muscular strength within normal limits.  ASSESSMENT 1. Diabetes Mellitus w/ peripheral neuropathy 2. Onychomycosis of nail due to dermatophyte bilateral 3. Pain in foot bilateral 4. Painful ankle joint synovitis and capsulitis right  PLAN OF CARE 1. Patient evaluated today. 2. Instructed to maintain good pedal hygiene and foot care. Stressed importance of controlling blood sugar.  3. Mechanical debridement of nails 1-5 bilaterally performed using a nail nipper. Filed with dremel without incident.  4. Injection of 0.5 mL Celestone Soluspan injected into the right ankle  joint. 5. Return to clinic in 3 mos.     Edrick Kins, DPM [facility]  Dr. Edrick Kins, [address]                                        [place], [place] [address]                Office [phone]  Fax [phone]

## 2017-03-04 DIAGNOSIS — M25539 Pain in unspecified wrist: Secondary | ICD-10-CM | POA: Insufficient documentation

## 2017-03-04 DIAGNOSIS — M25532 Pain in left wrist: Secondary | ICD-10-CM

## 2017-03-04 DIAGNOSIS — G8929 Other chronic pain: Secondary | ICD-10-CM | POA: Insufficient documentation

## 2017-03-08 ENCOUNTER — Ambulatory Visit (INDEPENDENT_AMBULATORY_CARE_PROVIDER_SITE_OTHER): Payer: Medicare Other | Admitting: Podiatry

## 2017-03-08 DIAGNOSIS — M79609 Pain in unspecified limb: Secondary | ICD-10-CM

## 2017-03-08 DIAGNOSIS — B351 Tinea unguium: Secondary | ICD-10-CM

## 2017-03-08 NOTE — Progress Notes (Addendum)
Complaint:  Visit Type: Patient returns to my office for continued preventative foot care services. Complaint: Patient states" my nails have grown long and thick and become painful to walk and wear shoes" Patient has been diagnosed with DM with  angiopathy.  The patient presents for preventative foot care services. No changes to ROS  Podiatric Exam: Vascular: dorsalis pedis and posterior tibial pulses are barely palpable bilateral. Capillary return is immediate. Temperature gradient is WNL. Skin turgor WNL  Sensorium: Normal Semmes Weinstein monofilament test. Normal tactile sensation bilaterally. Nail Exam: Pt has thick disfigured discolored nails with subungual debris noted bilateral entire nail hallux through fifth toenails Ulcer Exam: There is no evidence of ulcer or pre-ulcerative changes or infection. Orthopedic Exam: Muscle tone and strength are WNL. No limitations in general ROM. No crepitus or effusions noted. 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 Skin: No Porokeratosis. No infection or ulcers  Diagnosis:  Onychomycosis, , Pain in right toe, pain in left toes  Treatment & Plan Procedures and Treatment: Consent by patient was obtained for treatment procedures. The patient understood the discussion of treatment and procedures well. All questions were answered thoroughly reviewed. Debridement of mycotic and hypertrophic toenails, 1 through 5 bilateral and clearing of subungual debris. No ulceration, no infection noted. Initiate diabetic shoe paperwork for DPN and angiopathy. Return Visit-Office Procedure: Patient instructed to return to the office for a follow up visit 3 months for continued evaluation and treatment.    Gardiner Barefoot DPM

## 2017-04-01 ENCOUNTER — Telehealth: Payer: Self-pay | Admitting: Podiatry

## 2017-04-01 NOTE — Telephone Encounter (Signed)
Tried to call patient to set up appointment to get measured for diabetic shoes, her phone has been disconnected. Unable to contact her.

## 2017-06-07 ENCOUNTER — Ambulatory Visit (INDEPENDENT_AMBULATORY_CARE_PROVIDER_SITE_OTHER): Payer: Medicare Other | Admitting: Podiatry

## 2017-06-07 DIAGNOSIS — M79676 Pain in unspecified toe(s): Secondary | ICD-10-CM

## 2017-06-07 DIAGNOSIS — B351 Tinea unguium: Secondary | ICD-10-CM | POA: Diagnosis not present

## 2017-06-07 DIAGNOSIS — M7751 Other enthesopathy of right foot: Secondary | ICD-10-CM

## 2017-06-07 NOTE — Progress Notes (Signed)
Complaint:  Visit Type: Patient returns to my office for continued preventative foot care services. Complaint: Patient states" my nails have grown long and thick and become painful to walk and wear shoes" Patient has been diagnosed with DM with  angiopathy.  The patient presents for preventative foot care services. No changes to ROS.  Patient says she had ankle pain due to gout 2 weeks ago.  Podiatric Exam: Vascular: dorsalis pedis and posterior tibial pulses are barely palpable bilateral. Capillary return is immediate. Temperature gradient is WNL. Skin turgor WNL  Sensorium: Normal Semmes Weinstein monofilament test. Normal tactile sensation bilaterally. Nail Exam: Pt has thick disfigured discolored nails with subungual debris noted bilateral entire nail hallux through fifth toenails Ulcer Exam: There is no evidence of ulcer or pre-ulcerative changes or infection. Orthopedic Exam: Muscle tone and strength are WNL. No limitations in general ROM. No crepitus or effusions noted. 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 Skin: No Porokeratosis. No infection or ulcers  Diagnosis:  Onychomycosis, , Pain in right toe, pain in left toes  Treatment & Plan Procedures and Treatment: Consent by patient was obtained for treatment procedures. The patient understood the discussion of treatment and procedures well. All questions were answered thoroughly reviewed. Debridement of mycotic and hypertrophic toenails, 1 through 5 bilateral and clearing of subungual debris. No ulceration, no infection noted.  Return Visit-Office Procedure: Patient instructed to return to the office for a follow up visit 3 months for continued evaluation and treatment.    Gardiner Barefoot DPM

## 2017-07-07 DIAGNOSIS — M79644 Pain in right finger(s): Secondary | ICD-10-CM | POA: Insufficient documentation

## 2017-09-13 ENCOUNTER — Ambulatory Visit: Payer: Medicare Other | Admitting: Podiatry

## 2017-10-06 DIAGNOSIS — D500 Iron deficiency anemia secondary to blood loss (chronic): Secondary | ICD-10-CM | POA: Insufficient documentation

## 2017-10-28 ENCOUNTER — Ambulatory Visit: Payer: Self-pay | Admitting: Internal Medicine

## 2017-11-04 ENCOUNTER — Encounter: Payer: Self-pay | Admitting: Internal Medicine

## 2017-11-04 ENCOUNTER — Ambulatory Visit (INDEPENDENT_AMBULATORY_CARE_PROVIDER_SITE_OTHER): Payer: Medicare Other | Admitting: Internal Medicine

## 2017-11-04 VITALS — BP 124/74 | HR 76 | Resp 16 | Ht 65.0 in | Wt 209.0 lb

## 2017-11-04 DIAGNOSIS — R0602 Shortness of breath: Secondary | ICD-10-CM | POA: Diagnosis not present

## 2017-11-04 DIAGNOSIS — D860 Sarcoidosis of lung: Secondary | ICD-10-CM

## 2017-11-04 DIAGNOSIS — G4730 Sleep apnea, unspecified: Secondary | ICD-10-CM

## 2017-11-04 NOTE — Patient Instructions (Signed)

## 2017-11-04 NOTE — Progress Notes (Signed)
[facility] [place], [place] [address] Pulmonary Sleep Medicine  Office Visit Note  Patient Name: Chelsey [name]: [id number] [id number] Date of Service: 11/04/2017     Complaints/HPI:   Patient is here for follow-up of sarcoidosis.  She has had no admissions to the hospital.  Overall she is doing fairly well.  She has no cough or congestion noted at this time.  Denies having any chest pain.  She has no fevers noted.  She continues to take her medications as ordered. No edema noted she has no GI issues. She does note occasional wheeze and shortness of breath. Patient has had PSG in the past. She states that she is on oxygen alone at this time. No follow up has been done  Current Medication: Outpatient Encounter Medications as of 11/04/2017  Medication Sig Note  . allopurinol (ZYLOPRIM) 300 MG tablet Take 300 mg by mouth 2 (two) times daily.    .Marland KitchenamLODipine (NORVASC) 10 MG tablet Take 10 mg by mouth every morning.    .Marland Kitchenaspirin EC 81 MG tablet Take 81 mg by mouth daily.   . Blood Glucose Calibration (TAI DOC CONTROL) NORMAL SOLN  09/20/2013: Received from: External Pharmacy  . Blood Glucose Monitoring Suppl (ACCU-CHEK AVIVA PLUS) W/DEVICE KIT Use as directed. 12/20/2013: Received from: External Pharmacy  . clobetasol ointment (TEMOVATE) 0.05 % Apply topically.   . COLCRYS 0.6 MG tablet Take 0.6 mg by mouth 2 (two) times daily as needed (for gout flares.).  09/20/2013: Received from: External Pharmacy  . diclofenac sodium (VOLTAREN) 1 % GEL    . ferrous sulfate 325 (65 FE) MG tablet Take 325 mg by mouth daily with breakfast.   . fluticasone (FLONASE) 50 MCG/ACT nasal spray Frequency:QD   Dosage:50   MCG  Instructions:  Note:2 sprays each nostril daily Dose: 50MCG 08/21/2016: [facility] . Fluticasone-Salmeterol (ADVAIR) 250-50 MCG/DOSE AEPB Inhale 1 puff into the lungs 2 (two) times daily.   . furosemide (LASIX) 40 MG tablet Take 40 mg  by mouth daily.  09/20/2013: Received from: External Pharmacy  . gabapentin (NEURONTIN) 300 MG capsule Take 300 mg by mouth 2 (two) times daily.    .Marland Kitchenglucose blood test strip 1 each daily. Use 1 test strip daily. 09/20/2013: Received from: External Pharmacy  . hydroxychloroquine (PLAQUENIL) 200 MG tablet Take by mouth.   . insulin lispro (HUMALOG KWIKPEN) 100 UNIT/ML KiwkPen Inject 20 Units into the skin 3 (three) times daily before meals.   . Insulin Pen Needle (FIFTY50 PEN NEEDLES) 31G X 8 MM MISC USE THREE TIMES A DAY   . LANCETS ULTRA THIN MISC Apply 1 each topically daily.  09/20/2013: Received from: External Pharmacy  . LANTUS 100 UNIT/ML injection Inject 35 Units into the skin 2 (two) times daily.  12/20/2013: Received from: External Pharmacy  . lisinopril (PRINIVIL,ZESTRIL) 40 MG tablet Take 40 mg by mouth daily.    . metFORMIN (GLUCOPHAGE) 1000 MG tablet 1,000 mg 2 (two) times daily with a meal.  09/20/2013: Received from: External Pharmacy  . omeprazole (PRILOSEC) 20 MG capsule Take 20 mg by mouth 2 (two) times daily before a meal.  09/20/2013: Received from: External Pharmacy  . polyethylene glycol (MIRALAX / GLYCOLAX) packet Take 17 g by mouth daily.   . simvastatin (ZOCOR) 20 MG tablet Take 20 mg by mouth daily with lunch.  12/20/2013: Received from: External Pharmacy  . triamcinolone cream (KENALOG) 0.1 % Apply twice  a day to affected areas of dry itchy skin until clear   . albuterol (PROAIR HFA) 108 (90 BASE) MCG/ACT inhaler Inhale 2 puffs into the lungs every 6 (six) hours as needed. For wheezing or shortness of breath. 07/06/2015: Received from: [place]: Inhale into the lungs.  . betamethasone acetate-betamethasone sodium phosphate (CELESTONE) 6 (3-3) MG/ML injection Inject 3 mg into the muscle.   Marland Kitchen HYDROcodone-acetaminophen (NORCO) 7.5-325 MG tablet Take 1-2 tablets by mouth every 4 (four) hours as needed for moderate pain. (Patient not taking: Reported on 11/04/2017)    . levothyroxine (SYNTHROID, LEVOTHROID) 88 MCG tablet Take 88 mcg by mouth daily before breakfast.  09/20/2013: Received from: External Pharmacy  . [DISCONTINUED] ferrous sulfate 325 (65 FE) MG tablet Take 325 mg by mouth.    Facility-Administered Encounter Medications as of 11/04/2017  Medication  . betamethasone acetate-betamethasone sodium phosphate (CELESTONE) injection 3 mg    Surgical History: Past Surgical History:  Procedure Laterality Date  . BRAIN SURGERY  1990's  . CATARACT EXTRACTION W/PHACO Left 06/11/2016   Procedure: CATARACT EXTRACTION PHACO AND INTRAOCULAR LENS PLACEMENT (IOC);  Surgeon: Birder Robson, MD;  Location: [facility] ORS;  Service: Ophthalmology;  Laterality: Left;  [place] 1.01 AP% 21.6 CDE 13.23 Fluid pack lot # [id number]  . CEREBRAL ANEURYSM REPAIR  1990's   COILS  . CORONARY ARTERY BYPASS GRAFT    . EYE SURGERY  2000's   bilateral cataract  . FRACTURE SURGERY    . HERNIA REPAIR  2000   ventral  . STENT PLACEMENT VASCULAR ([place] [facility])    . VEIN BYPASS SURGERY    . VENTRAL HERNIA REPAIR N/A 04/29/2016   Procedure: Laparoscopic HERNIA REPAIR VENTRAL ADULT;  Surgeon: Jules Husbands, MD;  Location: [facility] ORS;  Service: General;  Laterality: N/A;    Medical History: Past Medical History:  Diagnosis Date  . Anemia   . Aneurysm ([place])   . Arthritis    RHEUMATOID ARTHRITIS  . Asthma   . Collagen vascular disease ([place])   . COPD (chronic obstructive pulmonary disease) ([place])   . Coronary artery disease   . Diabetes mellitus without complication ([place])   . Edema    FEET/LEGS  . GERD (gastroesophageal reflux disease)   . Gout   . H/O wheezing   . History of hiatal hernia   . Hypertension   . Hypothyroidism   . Neuropathy   . Oxygen deficiency    HS  . Peripheral vascular disease ([place])   . Sarcoidosis of lung ([place])   . Seizures ([place])    WITH BRAIN 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   . Sleep apnea    OXYGEN AT NIGHT 3 L [place]    Family History: Family History   Problem Relation Age of Onset  . Heart disease Mother   . Hypertension Mother   . Diabetes Mother   . Diabetes Father   . Heart disease Father   . Hypertension Father     Social History: Social History   Socioeconomic History  . Marital status: Single    Spouse name: Not on file  . Number of children: Not on file  . Years of education: Not on file  . Highest education level: Not on file  Social Needs  . Financial resource strain: Not on file  . Food insecurity - worry: Not on file  . Food insecurity - inability: Not on file  . Transportation needs - medical: Not on file  . Transportation needs - non-medical:  Not on file  Occupational History  . Not on file  Tobacco Use  . Smoking status: Former Smoker    Packs/day: 1.00    Years: 30.00    Pack years: 30.00    Types: Cigarettes    Last attempt to quit: 01/27/2006    Years since quitting: 11.7  . Smokeless tobacco: Never Used  . Tobacco comment: quit   Substance and Sexual Activity  . Alcohol use: No    Alcohol/week: 0.0 oz  . Drug use: No  . Sexual activity: Not on file  Other Topics Concern  . Not on file  Social History Narrative  . Not on file     ROS  General: (-) fever, (-) chills, (-) night sweats, (-) weakness, (-) changes in appetite. Skin: (-) rashes, (-) itching,. Eyes: (-) visual changes, (-) redness, (-) itching, (-) double or blurred vision. Nose and Sinuses: (-) nasal stuffiness or itchiness, (-) postnasal drip, (-) nosebleeds, (-) sinus trouble. Mouth and Throat: (-) sore throat, (-) hoarseness. Neck: (-) swollen glands, (-) enlarged thyroid, (-) neck pain. Respiratory: (-) cough, (-) bloody sputum, (-) shortness of breath, (-) wheezing. Cardiovascular: (-) ankle swelling, (-) chest pain. Lymphatic: (-) lymph node enlargement, (-) lymph node tenderness. Neurologic: (-) numbness, (-) tingling,(-) dizziness. Psychiatric: (-) anxiety, (-) depression.  Vital Signs: Blood pressure 124/74, pulse  76, resp. rate 16, height 5' 5"  (1.651 m), weight 209 lb (94.8 kg), SpO2 94 %.  Examination: General Appearance: The patient is well-developed, well-nourished, and in no distress. Skin: Gross inspection of skin demonstrates no evidence of abnormality. Head: Patient's head is normocephalic, no gross deformities. Eyes: no gross deformities noted. ENT: ears appear grossly normal. Nasopharynx appears to be normal. Neck: Supple. No thyromegaly. No LAD. Respiratory: Lungs are clear to auscultation with no adventitious sounds. Cardiovascular: Normal S1 and S2 without murmur or rub. Extremities: No cyanosis. pulses are equal. Neurologic: Alert and oriented. No involuntary movements.  LABS: No results found for this or any previous visit (from the past 2160 hour(s)).  Radiology: No results found.  No results found.  No results found.    Assessment and Plan: Patient Active Problem List   Diagnosis Date Noted  . Chronic pain of left wrist 03/04/2017  . Chronic pain of left ankle 11/03/2016  . Chronic pain of left knee 11/03/2016  . Right renal mass 11/03/2016  . Hyperlipemia 07/29/2016  . Recurrent ventral hernia   . Ventral hernia without obstruction or gangrene 03/25/2016  . Foot pain 12/10/2015  . Chronic gouty arthropathy without tophi 12/10/2015  . 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 Sarcoidosis of lung ([place]) 12/10/2015  . Sarcoidosis, nodular type 12/10/2015  . Chronic foot pain, left 12/10/2015  . Chronic idiopathic gout of multiple sites 08/05/2015  . Sleep related hypoventilation/hypoxemia in other disease 04/29/2015  . NAFLD (nonalcoholic fatty liver disease) 10/10/2014  . Hypothyroidism due to acquired atrophy of thyroid 07/11/2014  . Type 2 diabetes mellitus with complication ([place]) 01/77/9390  . Primary localized osteoarthrosis, lower leg 10/21/2012  . Sarcoidosis 05/24/2008  . Peripheral vascular disease ([place]) 02/15/2007  . Hypertension 07/03/1999     1.   Sarcoidosis of the lung   patient's are quite has been fairly stable  The last CT scan had shown some scattered nodules but grossly unchanged  We will continue with supportive care at this time  2. Obstructive sleep apnea-hypopnea syndrome  she needs to have a follow-up done for this she has been  basically noncompliant  She states that she uses the a oxygen occasionally and needs to be more regular with this  I will schedule her for a follow-up PSG to reassess her sleep disordered breathing  3. Shortness of breath  she will be scheduled for pulmonary function testing on a routine basis  4. Morbid obesity  we discussed weight loss and dietary management along with exercise  General Counseling: I have discussed the findings of the evaluation and examination with [facility].  I have also discussed any further diagnostic evaluation thatmay be needed or ordered today. Ashaunte verbalizes understanding of the findings of todays visit. We also reviewed her medications today and discussed drug interactions and side effects including but not limited excessive drowsiness and altered mental states. We also discussed that there is always a risk not just to her but also people around her. she has been encouraged to call the office with any questions or concerns that should arise related to todays visit.    Time spent:  35 min  I have personally obtained a history, examined the patient, evaluated laboratory and imaging results, formulated the assessment and plan and placed orders.    Allyne Gee, MD [facility] Pulmonary and Critical Care Sleep medicine

## 2017-12-07 DIAGNOSIS — R1011 Right upper quadrant pain: Secondary | ICD-10-CM | POA: Insufficient documentation

## 2018-01-25 ENCOUNTER — Other Ambulatory Visit: Payer: Self-pay

## 2018-02-07 ENCOUNTER — Ambulatory Visit: Payer: Self-pay | Admitting: Internal Medicine

## 2018-06-02 ENCOUNTER — Ambulatory Visit: Payer: Medicare Other | Admitting: Podiatry

## 2018-06-06 ENCOUNTER — Ambulatory Visit (INDEPENDENT_AMBULATORY_CARE_PROVIDER_SITE_OTHER): Payer: Medicare Other | Admitting: Podiatry

## 2018-06-06 ENCOUNTER — Encounter: Payer: Self-pay | Admitting: Podiatry

## 2018-06-06 DIAGNOSIS — L851 Acquired keratosis [keratoderma] palmaris et plantaris: Secondary | ICD-10-CM

## 2018-06-06 DIAGNOSIS — B351 Tinea unguium: Secondary | ICD-10-CM | POA: Diagnosis not present

## 2018-06-06 DIAGNOSIS — E1159 Type 2 diabetes mellitus with other circulatory complications: Secondary | ICD-10-CM | POA: Diagnosis not present

## 2018-06-06 DIAGNOSIS — M79676 Pain in unspecified toe(s): Secondary | ICD-10-CM | POA: Diagnosis not present

## 2018-06-06 DIAGNOSIS — L84 Corns and callosities: Secondary | ICD-10-CM

## 2018-06-06 NOTE — Progress Notes (Signed)
Complaint:  Visit Type: Patient returns to my office for continued preventative foot care services. Complaint: Patient states" my nails have grown long and thick and become painful to walk and wear shoes" Patient has been diagnosed with DM with  angiopathy.  The patient presents for preventative foot care services. No changes to ROS.  Patient says she has painful callus both feet.  Podiatric Exam: Vascular: dorsalis pedis and posterior tibial pulses are barely palpable bilateral. Capillary return is immediate. Temperature gradient is WNL. Skin turgor WNL  Sensorium: Normal Semmes Weinstein monofilament test. Normal tactile sensation bilaterally. Nail Exam: Pt has thick disfigured discolored nails with subungual debris noted bilateral entire nail hallux through fifth toenails Ulcer Exam: There is no evidence of ulcer or pre-ulcerative changes or infection. Orthopedic Exam: Muscle tone and strength are WNL. No limitations in general ROM. No crepitus or effusions noted. Pes planus with HAV  B/L  .DJD STJ left foot. Skin:  Porokeratosis  Sub 1  B/L. No infection or ulcers.  Pinch callus  B/l  Diagnosis:  Onychomycosis, , Pain in right toe, pain in left toes,  Porokeratosis  B/L  Treatment & Plan Procedures and Treatment: Consent by patient was obtained for treatment procedures. The patient understood the discussion of treatment and procedures well. All questions were answered thoroughly reviewed. Debridement of mycotic and hypertrophic toenails, 1 through 5 bilateral and clearing of subungual debris. No ulceration, no infection noted. Debride porokeratosis. Return Visit-Office Procedure: Patient instructed to return to the office for a follow up visit 3 months for continued evaluation and treatment.    Gardiner Barefoot DPM

## 2018-06-07 ENCOUNTER — Encounter: Payer: Self-pay | Admitting: Podiatry

## 2018-09-05 ENCOUNTER — Ambulatory Visit (INDEPENDENT_AMBULATORY_CARE_PROVIDER_SITE_OTHER): Payer: Medicare Other | Admitting: Podiatry

## 2018-09-05 ENCOUNTER — Ambulatory Visit: Payer: Medicare Other | Admitting: Podiatry

## 2018-09-05 ENCOUNTER — Encounter: Payer: Self-pay | Admitting: Podiatry

## 2018-09-05 DIAGNOSIS — B351 Tinea unguium: Secondary | ICD-10-CM

## 2018-09-05 DIAGNOSIS — M79676 Pain in unspecified toe(s): Secondary | ICD-10-CM | POA: Diagnosis not present

## 2018-09-05 DIAGNOSIS — E1159 Type 2 diabetes mellitus with other circulatory complications: Secondary | ICD-10-CM | POA: Diagnosis not present

## 2018-09-05 NOTE — Progress Notes (Signed)
Complaint:  Visit Type: Patient returns to my office for continued preventative foot care services. Complaint: Patient states" my nails have grown long and thick and become painful to walk and wear shoes" Patient has been diagnosed with DM with  angiopathy.  The patient presents for preventative foot care services. No changes to ROS.    Podiatric Exam: Vascular: dorsalis pedis and posterior tibial pulses are barely palpable bilateral. Capillary return is immediate. Temperature gradient is WNL. Skin turgor WNL  Sensorium: Normal Semmes Weinstein monofilament test. Normal tactile sensation bilaterally. Nail Exam: Pt has thick disfigured discolored nails with subungual debris noted bilateral entire nail hallux through fifth toenails Ulcer Exam: There is no evidence of ulcer or pre-ulcerative changes or infection. Orthopedic Exam: Muscle tone and strength are WNL. No limitations in general ROM. No crepitus or effusions noted. Pes planus with HAV  B/L  .DJD STJ left foot. Skin:  Porokeratosis  Sub 1  B/L. No infection or ulcers.  Pinch callus  B/l  Diagnosis:  Onychomycosis, , Pain in right toe, pain in left toes,    Treatment & Plan Procedures and Treatment: Consent by patient was obtained for treatment procedures. The patient understood the discussion of treatment and procedures well. All questions were answered thoroughly reviewed. Debridement of mycotic and hypertrophic toenails, 1 through 5 bilateral and clearing of subungual debris. No ulceration, no infection noted.  Return Visit-Office Procedure: Patient instructed to return to the office for a follow up visit 3 months for continued evaluation and treatment.    Gardiner Barefoot DPM

## 2018-10-11 ENCOUNTER — Ambulatory Visit: Payer: Self-pay | Admitting: Internal Medicine

## 2018-10-24 ENCOUNTER — Ambulatory Visit: Payer: Self-pay | Admitting: Internal Medicine

## 2018-11-21 ENCOUNTER — Ambulatory Visit: Payer: Self-pay | Admitting: Internal Medicine

## 2018-12-05 ENCOUNTER — Ambulatory Visit (INDEPENDENT_AMBULATORY_CARE_PROVIDER_SITE_OTHER): Payer: Medicare Other | Admitting: Podiatry

## 2018-12-05 ENCOUNTER — Encounter: Payer: Self-pay | Admitting: Podiatry

## 2018-12-05 DIAGNOSIS — E1159 Type 2 diabetes mellitus with other circulatory complications: Secondary | ICD-10-CM

## 2018-12-05 DIAGNOSIS — L84 Corns and callosities: Secondary | ICD-10-CM

## 2018-12-05 DIAGNOSIS — M79676 Pain in unspecified toe(s): Secondary | ICD-10-CM

## 2018-12-05 DIAGNOSIS — B351 Tinea unguium: Secondary | ICD-10-CM | POA: Diagnosis not present

## 2018-12-05 NOTE — Progress Notes (Signed)
Complaint:  Visit Type: Patient returns to my office for continued preventative foot care services. Complaint: Patient states" my nails have grown long and thick and become painful to walk and wear shoes" Patient has been diagnosed with DM with  angiopathy.  The patient presents for preventative foot care services. No changes to ROS.  Patient says her callus on her big toes are usually treated.  Podiatric Exam: Vascular: dorsalis pedis and posterior tibial pulses are barely palpable bilateral. Capillary return is immediate. Temperature gradient is WNL. Skin turgor WNL  Sensorium: Normal Semmes Weinstein monofilament test. Normal tactile sensation bilaterally. Nail Exam: Pt has thick disfigured discolored nails with subungual debris noted bilateral entire nail hallux through fifth toenails Ulcer Exam: There is no evidence of ulcer or pre-ulcerative changes or infection. Orthopedic Exam: Muscle tone and strength are WNL. No limitations in general ROM. No crepitus or effusions noted. Pes planus with HAV  B/L  .DJD STJ left foot. Skin:  Porokeratosis  Sub 1  B/L. No infection or ulcers.  Pinch callus  B/l  Diagnosis:  Onychomycosis, , Pain in right toe, pain in left toes,  Pinch callus  /L  Treatment & Plan Procedures and Treatment: Consent by patient was obtained for treatment procedures. The patient understood the discussion of treatment and procedures well. All questions were answered thoroughly reviewed. Debridement of mycotic and hypertrophic toenails, 1 through 5 bilateral and clearing of subungual debris. No ulceration, no infection noted. Debride callus. Return Visit-Office Procedure: Patient instructed to return to the office for a follow up visit 3 months for continued evaluation and treatment.    Gardiner Barefoot DPM

## 2018-12-06 ENCOUNTER — Ambulatory Visit (INDEPENDENT_AMBULATORY_CARE_PROVIDER_SITE_OTHER): Payer: Medicare Other | Admitting: Internal Medicine

## 2018-12-06 ENCOUNTER — Encounter: Payer: Self-pay | Admitting: Internal Medicine

## 2018-12-06 VITALS — BP 120/70 | HR 75 | Resp 16 | Ht 65.0 in | Wt 215.0 lb

## 2018-12-06 DIAGNOSIS — M1A09X0 Idiopathic chronic gout, multiple sites, without tophus (tophi): Secondary | ICD-10-CM

## 2018-12-06 DIAGNOSIS — I10 Essential (primary) hypertension: Secondary | ICD-10-CM | POA: Diagnosis not present

## 2018-12-06 DIAGNOSIS — R0602 Shortness of breath: Secondary | ICD-10-CM | POA: Diagnosis not present

## 2018-12-06 DIAGNOSIS — E782 Mixed hyperlipidemia: Secondary | ICD-10-CM

## 2018-12-06 DIAGNOSIS — D860 Sarcoidosis of lung: Secondary | ICD-10-CM

## 2018-12-06 DIAGNOSIS — E1165 Type 2 diabetes mellitus with hyperglycemia: Secondary | ICD-10-CM

## 2018-12-06 LAB — POCT GLYCOSYLATED HEMOGLOBIN (HGB A1C): Hemoglobin A1C: 8.8 % — AB (ref 4.0–5.6)

## 2018-12-06 MED ORDER — METHYLPREDNISOLONE ACETATE 40 MG/ML IJ SUSP
40.0000 mg | Freq: Once | INTRAMUSCULAR | Status: AC
  Administered 2018-12-06: 40 mg via INTRAMUSCULAR

## 2018-12-06 NOTE — Progress Notes (Signed)
[facility] [place], [place] [address]  Internal MEDICINE  Office Visit Note  Patient Name: Chelsey [name]  [id number]  [id number]  Date of Service: 12/06/2018  Chief Complaint  Patient presents with  . Sarcoidosis  . Shortness of Breath    Shortness of Breath  This is a recurrent (Has h/o sarcoidosis, c/o increasing sob, has not been using any MDI) problem. The current episode started 1 to 4 weeks ago. The problem occurs daily. The problem has been unchanged. Associated symptoms include headaches. Pertinent negatives include no abdominal pain, chest pain, ear pain, leg swelling, neck pain, vomiting or wheezing. Associated symptoms comments: C/o nodules all over . The treatment provided no relief. Her past medical history is significant for COPD.  Other  This is a chronic (multiple medical problems including, dm htn, gout) problem. Associated symptoms include headaches. Pertinent negatives include no abdominal pain, arthralgias, chest pain, chills, coughing, diaphoresis, fatigue, nausea, neck pain or vomiting. Treatments tried: by [facility] pcp, however she is unable to keep follow ups due to long distance, she has not seen a rheumatologist either, will like to get pcp locally.    Current Medication: Outpatient Encounter Medications as of 12/06/2018  Medication Sig Note  . allopurinol (ZYLOPRIM) 300 MG tablet Take 300 mg by mouth 2 (two) times daily.    Marland Kitchen amLODipine (NORVASC) 10 MG tablet TAKE 1 TABLET BY MOUTH DAILY   . aspirin EC 81 MG tablet Take 81 mg by mouth daily.   . betamethasone acetate-betamethasone sodium phosphate (CELESTONE) 6 (3-3) MG/ML injection Inject 3 mg into the muscle.   . Blood Glucose Calibration (TAI DOC CONTROL) NORMAL SOLN  09/20/2013: Received from: External Pharmacy  . Blood Glucose Monitoring Suppl (ACCU-CHEK AVIVA PLUS) W/DEVICE KIT Use as directed. 12/20/2013: Received from: External Pharmacy  . clobetasol ointment (TEMOVATE) 0.05 % Apply  topically.   . COLCRYS 0.6 MG tablet Take 0.6 mg by mouth 2 (two) times daily as needed (for gout flares.).  09/20/2013: Received from: External Pharmacy  . diclofenac sodium (VOLTAREN) 1 % GEL    . empagliflozin (JARDIANCE) 10 MG TABS tablet Take by mouth.   . ferrous sulfate 325 (65 FE) MG tablet Take 325 mg by mouth daily with breakfast.   . fluticasone (FLONASE) 50 MCG/ACT nasal spray Frequency:QD   Dosage:50   MCG  Instructions:  Note:2 sprays each nostril daily Dose: 50MCG 08/21/2016: Received from: [facility]  . Fluticasone-Salmeterol (ADVAIR) 250-50 MCG/DOSE AEPB Inhale 1 puff into the lungs 2 (two) times daily.   . furosemide (LASIX) 40 MG tablet TAKE 1 TABLET BY MOUTH EVERY DAY   . gabapentin (NEURONTIN) 300 MG capsule Take 300 mg by mouth 2 (two) times daily.    Marland Kitchen glucose blood test strip 1 each daily. Use 1 test strip daily. 09/20/2013: Received from: External Pharmacy  . HYDROcodone-acetaminophen (NORCO) 7.5-325 MG tablet Take 1-2 tablets by mouth every 4 (four) hours as needed for moderate pain.   . hydroxychloroquine (PLAQUENIL) 200 MG tablet TAKE 1 TABLET BY MOUTH TWICE A DAY   . insulin lispro (HUMALOG KWIKPEN) 100 UNIT/ML KwikPen Inject into the skin.   . Insulin Pen Needle (FIFTY50 PEN NEEDLES) 31G X 8 MM MISC USE THREE TIMES A DAY   . LANCETS ULTRA THIN MISC Apply 1 each topically daily.  09/20/2013: Received from: External Pharmacy  . LANTUS SOLOSTAR 100 UNIT/ML Solostar Pen    . levothyroxine (SYNTHROID, LEVOTHROID) 88 MCG tablet Take 88 mcg by  mouth daily before breakfast.  09/20/2013: Received from: External Pharmacy  . lisinopril (PRINIVIL,ZESTRIL) 40 MG tablet Take 40 mg by mouth daily.    . metFORMIN (GLUCOPHAGE) 1000 MG tablet 1,000 mg 2 (two) times daily with a meal.  09/20/2013: Received from: External Pharmacy  . omeprazole (PRILOSEC) 20 MG capsule Take 20 mg by mouth 2 (two) times daily before a meal.  09/20/2013: Received from: External Pharmacy  . polyethylene  glycol (MIRALAX / GLYCOLAX) packet Take 17 g by mouth daily.   . potassium chloride SA (K-DUR,KLOR-CON) 20 MEQ tablet TAKE 1 TABLET BY MOUTH DAILY   . simvastatin (ZOCOR) 20 MG tablet Take 20 mg by mouth daily with lunch.  12/20/2013: Received from: External Pharmacy  . tretinoin (RETIN-A) 0.025 % cream Apply topically.   . triamcinolone cream (KENALOG) 0.1 % Apply twice a day to affected areas of dry itchy skin until clear   . albuterol (PROAIR HFA) 108 (90 BASE) MCG/ACT inhaler Inhale 2 puffs into the lungs every 6 (six) hours as needed. For wheezing or shortness of breath. 07/06/2015: Received from: [facility]: Inhale into the lungs.   Facility-Administered Encounter Medications as of 12/06/2018  Medication  . betamethasone acetate-betamethasone sodium phosphate (CELESTONE) injection 3 mg  . [COMPLETED] methylPREDNISolone acetate (DEPO-MEDROL) injection 40 mg    Surgical History: Past Surgical History:  Procedure Laterality Date  . BRAIN SURGERY  1990's  . CATARACT EXTRACTION W/PHACO Left 06/11/2016   Procedure: CATARACT EXTRACTION PHACO AND INTRAOCULAR LENS PLACEMENT (IOC);  Surgeon: Birder Robson, MD;  Location: [facility] ORS;  Service: Ophthalmology;  Laterality: Left;  [place] 1.01 AP% 21.6 CDE 13.23 Fluid pack lot # [id number]  . CEREBRAL ANEURYSM REPAIR  1990's   COILS  . CORONARY ARTERY BYPASS GRAFT    . EYE SURGERY  2000's   bilateral cataract  . FRACTURE SURGERY    . HERNIA REPAIR  2000   ventral  . STENT PLACEMENT VASCULAR ([place] HX)    . VEIN BYPASS SURGERY    . VENTRAL HERNIA REPAIR N/A 04/29/2016   Procedure: Laparoscopic HERNIA REPAIR VENTRAL ADULT;  Surgeon: Jules Husbands, MD;  Location: [facility] ORS;  Service: General;  Laterality: N/A;    Medical History: Past Medical History:  Diagnosis Date  . Anemia   . Aneurysm ([place])   . Arthritis    RHEUMATOID ARTHRITIS  . Asthma   . Collagen vascular disease ([place])   . COPD (chronic obstructive pulmonary disease)  ([place])   . Coronary artery disease   . Diabetes mellitus without complication ([place])   . Edema    FEET/LEGS  . GERD (gastroesophageal reflux disease)   . Gout   . H/O wheezing   . History of hiatal hernia   . Hypertension   . Hypothyroidism   . Neuropathy   . Oxygen deficiency    HS  . Peripheral vascular disease ([place])   . Sarcoidosis of lung ([place])   . Seizures ([place])    WITH BRAIN 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   . Sleep apnea    OXYGEN AT NIGHT 3 L     Family History: Family History  Problem Relation Age of Onset  . Heart disease Mother   . Hypertension Mother   . Diabetes Mother   . Diabetes Father   . Heart disease Father   . Hypertension Father     Social History   Socioeconomic History  . Marital status: Single    Spouse name: Not on file  .  Number of children: Not on file  . Years of education: Not on file  . Highest education level: Not on file  Occupational History  . Not on file  Social Needs  . Financial resource strain: Not on file  . Food insecurity:    Worry: Not on file    Inability: Not on file  . Transportation needs:    Medical: Not on file    Non-medical: Not on file  Tobacco Use  . Smoking status: Former Smoker    Packs/day: 1.00    Years: 30.00    Pack years: 30.00    Types: Cigarettes    Last attempt to quit: 01/27/2006    Years since quitting: 12.8  . Smokeless tobacco: Never Used  . Tobacco comment: quit   Substance and Sexual Activity  . Alcohol use: No    Alcohol/week: 0.0 standard drinks  . Drug use: No  . Sexual activity: Not on file  Lifestyle  . Physical activity:    Days per week: Not on file    Minutes per session: Not on file  . Stress: Not on file  Relationships  . Social connections:    Talks on phone: Not on file    Gets together: Not on file    Attends religious service: Not on file    Active member of club or organization: Not on file    Attends meetings of clubs or organizations: Not on file    Relationship  status: Not on file  . Intimate partner violence:    Fear of current or ex partner: Not on file    Emotionally abused: Not on file    Physically abused: Not on file    Forced sexual activity: Not on file  Other Topics Concern  . Not on file  Social History Narrative  . Not on file   Review of Systems  Constitutional: Negative for chills, diaphoresis and fatigue.  HENT: Negative for ear pain, postnasal drip and sinus pressure.   Eyes: Negative for photophobia, discharge, redness, itching and visual disturbance.  Respiratory: Positive for shortness of breath. Negative for cough and wheezing.   Cardiovascular: Negative for chest pain, palpitations and leg swelling.  Gastrointestinal: Negative for abdominal pain, constipation, diarrhea, nausea and vomiting.  Genitourinary: Negative for dysuria and flank pain.  Musculoskeletal: Negative for arthralgias, back pain, gait problem and neck pain.  Skin: Negative for color change.  Allergic/Immunologic: Negative for environmental allergies and food allergies.  Neurological: Positive for headaches. Negative for dizziness.  Hematological: Does not bruise/bleed easily.  Psychiatric/Behavioral: Negative for agitation, behavioral problems (depression) and hallucinations.   Vital Signs: BP 120/70   Pulse 75   Resp 16   Ht 5' 5"  (1.651 m)   Wt 215 lb (97.5 kg)   SpO2 96%   BMI 35.78 kg/m   Physical Exam Constitutional:      General: She is not in acute distress.    Appearance: She is well-developed. She is not diaphoretic.  HENT:     Head: Normocephalic and atraumatic.     Mouth/Throat:     Pharynx: No oropharyngeal exudate.  Eyes:     Pupils: Pupils are equal, round, and reactive to light.  Neck:     Musculoskeletal: Normal range of motion and neck supple.     Thyroid: No thyromegaly.     Vascular: No JVD.     Trachea: No tracheal deviation.  Cardiovascular:     Rate and Rhythm: Normal rate and regular rhythm.  Heart sounds:  Normal heart sounds. No murmur. No friction rub. No gallop.   Pulmonary:     Effort: Pulmonary effort is normal. No respiratory distress.     Breath sounds: Examination of the right-upper field reveals wheezing. Examination of the left-upper field reveals wheezing. Wheezing present. No rales.  Chest:     Chest wall: No tenderness.  Abdominal:     General: Bowel sounds are normal.     Palpations: Abdomen is soft.  Musculoskeletal: Normal range of motion.  Lymphadenopathy:     Cervical: No cervical adenopathy.  Skin:    General: Skin is warm and dry.  Neurological:     Mental Status: She is alert and oriented to person, place, and time.     Cranial Nerves: No cranial nerve deficit.  Psychiatric:        Behavior: Behavior normal.        Thought Content: Thought content normal.        Judgment: Judgment normal.    Assessment/Plan: 1. Shortness of breath Samples of Symbicort 80 2 puffs bid, rinse mouth after each use  - Spirometry with Graph - methylPREDNISolone acetate (DEPO-MEDROL) injection 40 mg - CBC with Differential/Platelet; Future - TSH; Future - T4, free; Future - Comprehensive metabolic panel  2. Sarcoidosis of lung ([place]) - Pulmonary Function Test; Future - ANA w/Reflex if Positive; Future - Sedimentation rate; Future  3. Uncontrolled type 2 diabetes mellitus with hyperglycemia (HCC) - POCT HgB A1C (8.8), will make changes after labs are available  - Comprehensive metabolic panel  4. Idiopathic chronic gout of multiple sites without tophus - check uric acid  5. Essential hypertension, benign - Comprehensive metabolic panel  6. Mixed hyperlipidemia - Lipid Panel With LDL/HDL Ratio; Future  General Counseling: Zyana verbalizes understanding of the findings of todays visit and agrees with plan of treatment. I have discussed any further diagnostic evaluation that may be needed or ordered today. We also reviewed her medications today. she has been encouraged to call  the office with any questions or concerns that should arise related to todays visit.  Orders Placed This Encounter  Procedures  . POCT HgB A1C  . Spirometry with Graph  . Pulmonary Function Test    Meds ordered this encounter  Medications  . methylPREDNISolone acetate (DEPO-MEDROL) injection 40 mg    Time spent:25 Minutes  Dr Lavera Guise Internal medicine

## 2018-12-09 ENCOUNTER — Telehealth: Payer: Self-pay

## 2018-12-09 NOTE — Telephone Encounter (Signed)
Try to call pt several times for labs no answer and no voicemail

## 2018-12-14 ENCOUNTER — Ambulatory Visit (INDEPENDENT_AMBULATORY_CARE_PROVIDER_SITE_OTHER): Payer: Medicare Other | Admitting: Internal Medicine

## 2018-12-14 DIAGNOSIS — R0602 Shortness of breath: Secondary | ICD-10-CM

## 2018-12-14 DIAGNOSIS — D860 Sarcoidosis of lung: Secondary | ICD-10-CM | POA: Diagnosis not present

## 2018-12-14 LAB — PULMONARY FUNCTION TEST

## 2018-12-15 NOTE — Procedures (Signed)
[place], [address]  DATE OF SERVICE: December 14, 2018  Complete Pulmonary Function Testing Interpretation:  FINDINGS:  Forced vital capacity is normal.  The FEV1 is normal.  The FEV1 to FVC ratio is within normal limits.  Postbronchodilator there is no significant change in the FEV1 however clinical improvement may occur in the absence of spirometric improvement.  Total lung capacity is normal residual volume is increased residual volume total lung capacity ratio is increased.  The DLCO is moderately decreased and corrected for alveolar volume is within normal limits.  IMPRESSION:  Spirometry and lung volumes are within normal limits DLCO is moderately decreased.  Factors that affect the DLCO include smoking interstitial lung disease and emphysema clinically correlation is recommended  Allyne Gee, MD [facility] Pulmonary Critical Care Medicine Sleep Medicine

## 2018-12-27 ENCOUNTER — Ambulatory Visit: Payer: Medicare Other | Admitting: Internal Medicine

## 2018-12-27 ENCOUNTER — Encounter: Payer: Self-pay | Admitting: Internal Medicine

## 2018-12-29 LAB — LIPID PANEL WITH LDL/HDL RATIO
Cholesterol, Total: 126 mg/dL (ref 100–199)
HDL: 29 mg/dL — ABNORMAL LOW (ref >39)
LDL Calculated: 49 mg/dL (ref 0–99)
LDl/HDL Ratio: 1.7 ratio (ref 0.0–3.2)
Triglycerides: 240 mg/dL — ABNORMAL HIGH (ref 0–149)
VLDL Cholesterol Cal: 48 mg/dL — ABNORMAL HIGH (ref 5–40)

## 2018-12-29 LAB — CBC WITH DIFFERENTIAL/PLATELET
BASOS: 1 %
Basophils Absolute: 0 10*3/uL (ref 0.0–0.2)
EOS (ABSOLUTE): 0.3 10*3/uL (ref 0.0–0.4)
Eos: 7 %
Hematocrit: 37.9 % (ref 34.0–46.6)
Hemoglobin: 12.4 g/dL (ref 11.1–15.9)
Immature Grans (Abs): 0 10*3/uL (ref 0.0–0.1)
Immature Granulocytes: 1 %
Lymphocytes Absolute: 1.1 10*3/uL (ref 0.7–3.1)
Lymphs: 27 %
MCH: 27.5 pg (ref 26.6–33.0)
MCHC: 32.7 g/dL (ref 31.5–35.7)
MCV: 84 fL (ref 79–97)
Monocytes Absolute: 0.3 10*3/uL (ref 0.1–0.9)
Monocytes: 7 %
NEUTROS PCT: 57 %
Neutrophils Absolute: 2.4 10*3/uL (ref 1.4–7.0)
PLATELETS: 199 10*3/uL (ref 150–450)
RBC: 4.51 x10E6/uL (ref 3.77–5.28)
RDW: 16.1 % — ABNORMAL HIGH (ref 11.7–15.4)
WBC: 4.2 10*3/uL (ref 3.4–10.8)

## 2018-12-29 LAB — ANA W/REFLEX IF POSITIVE
ANA: POSITIVE — AB
Anti JO-1: <0.2 AI (ref 0.0–0.9)
Centromere Ab Screen: <0.2 AI (ref 0.0–0.9)
Chromatin Ab SerPl-aCnc: <0.2 AI (ref 0.0–0.9)
ENA RNP Ab: <0.2 AI (ref 0.0–0.9)
ENA SSA (RO) Ab: <0.2 AI (ref 0.0–0.9)
ENA SSB (LA) Ab: 1.5 AI — ABNORMAL HIGH (ref 0.0–0.9)
Scleroderma SCL-70: <0.2 AI (ref 0.0–0.9)
dsDNA Ab: <1 IU/mL (ref 0–9)

## 2018-12-29 LAB — COMPREHENSIVE METABOLIC PANEL
ALT: 23 IU/L (ref 0–32)
AST: 29 IU/L (ref 0–40)
Albumin/Globulin Ratio: 1.7 (ref 1.2–2.2)
Albumin: 4.3 g/dL (ref 3.8–4.8)
Alkaline Phosphatase: 145 IU/L — ABNORMAL HIGH (ref 39–117)
BUN/Creatinine Ratio: 28 (ref 12–28)
BUN: 47 mg/dL — ABNORMAL HIGH (ref 8–27)
Bilirubin Total: 0.4 mg/dL (ref 0.0–1.2)
CALCIUM: 9.3 mg/dL (ref 8.7–10.3)
CO2: 17 mmol/L — ABNORMAL LOW (ref 20–29)
Chloride: 102 mmol/L (ref 96–106)
Creatinine, Ser: 1.7 mg/dL — ABNORMAL HIGH (ref 0.57–1.00)
GFR calc Af Amer: 35 mL/min/{1.73_m2} — ABNORMAL LOW (ref >59)
GFR, EST NON AFRICAN AMERICAN: 31 mL/min/{1.73_m2} — AB (ref >59)
Globulin, Total: 2.6 g/dL (ref 1.5–4.5)
Glucose: 265 mg/dL — ABNORMAL HIGH (ref 65–99)
Potassium: 5.3 mmol/L — ABNORMAL HIGH (ref 3.5–5.2)
Sodium: 138 mmol/L (ref 134–144)
Total Protein: 6.9 g/dL (ref 6.0–8.5)

## 2018-12-29 LAB — TSH: TSH: 5.49 u[IU]/mL — ABNORMAL HIGH (ref 0.450–4.500)

## 2018-12-29 LAB — T4, FREE: Free T4: 0.9 ng/dL (ref 0.82–1.77)

## 2018-12-29 LAB — SEDIMENTATION RATE: SED RATE: 7 mm/h (ref 0–40)

## 2019-01-11 ENCOUNTER — Encounter: Payer: Self-pay | Admitting: Adult Health

## 2019-01-11 ENCOUNTER — Telehealth: Payer: Self-pay | Admitting: Adult Health

## 2019-01-11 ENCOUNTER — Other Ambulatory Visit: Payer: Self-pay

## 2019-01-11 ENCOUNTER — Ambulatory Visit (INDEPENDENT_AMBULATORY_CARE_PROVIDER_SITE_OTHER): Payer: Medicare Other | Admitting: Adult Health

## 2019-01-11 VITALS — BP 108/64 | HR 77 | Resp 16 | Ht 65.0 in | Wt 213.0 lb

## 2019-01-11 DIAGNOSIS — R7989 Other specified abnormal findings of blood chemistry: Secondary | ICD-10-CM

## 2019-01-11 DIAGNOSIS — E875 Hyperkalemia: Secondary | ICD-10-CM

## 2019-01-11 DIAGNOSIS — E1122 Type 2 diabetes mellitus with diabetic chronic kidney disease: Secondary | ICD-10-CM

## 2019-01-11 DIAGNOSIS — D860 Sarcoidosis of lung: Secondary | ICD-10-CM | POA: Diagnosis not present

## 2019-01-11 DIAGNOSIS — I10 Essential (primary) hypertension: Secondary | ICD-10-CM | POA: Diagnosis not present

## 2019-01-11 DIAGNOSIS — N186 End stage renal disease: Secondary | ICD-10-CM

## 2019-01-11 DIAGNOSIS — E782 Mixed hyperlipidemia: Secondary | ICD-10-CM

## 2019-01-11 MED ORDER — HYDRALAZINE HCL 10 MG PO TABS: 10.0000 mg | ORAL_TABLET | Freq: Three times a day (TID) | ORAL | 3 refills | Status: DC

## 2019-01-11 MED ORDER — GLIMEPIRIDE 2 MG PO TABS: 2.0000 mg | ORAL_TABLET | Freq: Every day | ORAL | 3 refills | Status: DC

## 2019-01-11 MED ORDER — DULAGLUTIDE 1.5 MG/0.5ML ~~LOC~~ SOAJ: 1.5000 mg | SUBCUTANEOUS | 2 refills | Status: DC

## 2019-01-11 NOTE — Telephone Encounter (Signed)
Spoke with Chelsey [name] in regards to her visit today, Chelsey [name] had spoke with Dr Humphrey Rolls and they decided on a plan , Chelsey [name] is to stop taking her novolog, Jardiance Metformin, lisinopril and Lasix at this time.  She is to start Amaryl , Trulicity and hydralazine, continue lantus.  Patient is to have labs drawn on Monday and will be seen on March 3 at 9:30 with Dr Humphrey Rolls .  Patient is to call back if she has any questions or concerns.

## 2019-01-11 NOTE — Patient Instructions (Signed)
Diabetes Mellitus and Nutrition, Adult  When you have diabetes (diabetes mellitus), it is very important to have healthy eating habits because your blood sugar (glucose) levels are greatly affected by what you eat and drink. Eating healthy foods in the appropriate amounts, at about the same times every day, can help you:  · Control your blood glucose.  · Lower your risk of heart disease.  · Improve your blood pressure.  · Reach or maintain a healthy weight.  Every person with diabetes is different, and each person has different needs for a meal plan. Your health care provider may recommend that you work with a diet and nutrition specialist (dietitian) to make a meal plan that is best for you. Your meal plan may vary depending on factors such as:  · The calories you need.  · The medicines you take.  · Your weight.  · Your blood glucose, blood pressure, and cholesterol levels.  · Your activity level.  · Other health conditions you have, such as heart or kidney disease.  How do carbohydrates affect me?  Carbohydrates, also called carbs, affect your blood glucose level more than any other type of food. Eating carbs naturally raises the amount of glucose in your blood. Carb counting is a method for keeping track of how many carbs you eat. Counting carbs is important to keep your blood glucose at a healthy level, especially if you use insulin or take certain oral diabetes medicines.  It is important to know how many carbs you can safely have in each meal. This is different for every person. Your dietitian can help you calculate how many carbs you should have at each meal and for each snack.  Foods that contain carbs include:  · Bread, cereal, rice, pasta, and crackers.  · Potatoes and corn.  · Peas, beans, and lentils.  · Milk and yogurt.  · Fruit and juice.  · Desserts, such as cakes, cookies, ice cream, and candy.  How does alcohol affect me?  Alcohol can cause a sudden decrease in blood glucose (hypoglycemia),  especially if you use insulin or take certain oral diabetes medicines. Hypoglycemia can be a life-threatening condition. Symptoms of hypoglycemia (sleepiness, dizziness, and confusion) are similar to symptoms of having too much alcohol.  If your health care provider says that alcohol is safe for you, follow these guidelines:  · Limit alcohol intake to no more than 1 drink per day for nonpregnant women and 2 drinks per day for men. One drink equals 12 oz of beer, 5 oz of wine, or 1½ oz of hard liquor.  · Do not drink on an empty stomach.  · Keep yourself hydrated with water, diet soda, or unsweetened iced tea.  · Keep in mind that regular soda, juice, and other mixers may contain a lot of sugar and must be counted as carbs.  What are tips for following this plan?    Reading food labels  · Start by checking the serving size on the "Nutrition Facts" label of packaged foods and drinks. The amount of calories, carbs, fats, and other nutrients listed on the label is based on one serving of the item. Many items contain more than one serving per package.  · Check the total grams (g) of carbs in one serving. You can calculate the number of servings of carbs in one serving by dividing the total carbs by 15. For example, if a food has 30 g of total carbs, it would be equal to 2   servings of carbs.  · Check the number of grams (g) of saturated and trans fats in one serving. Choose foods that have low or no amount of these fats.  · Check the number of milligrams (mg) of salt (sodium) in one serving. Most people should limit total sodium intake to less than 2,300 mg per day.  · Always check the nutrition information of foods labeled as "low-fat" or "nonfat". These foods may be higher in added sugar or refined carbs and should be avoided.  · Talk to your dietitian to identify your daily goals for nutrients listed on the label.  Shopping  · Avoid buying canned, premade, or processed foods. These foods tend to be high in fat, sodium,  and added sugar.  · Shop around the outside edge of the grocery store. This includes fresh fruits and vegetables, bulk grains, fresh meats, and fresh dairy.  Cooking  · Use low-heat cooking methods, such as baking, instead of high-heat cooking methods like deep frying.  · Cook using healthy oils, such as olive, canola, or sunflower oil.  · Avoid cooking with butter, cream, or high-fat meats.  Meal planning  · Eat meals and snacks regularly, preferably at the same times every day. Avoid going long periods of time without eating.  · Eat foods high in fiber, such as fresh fruits, vegetables, beans, and whole grains. Talk to your dietitian about how many servings of carbs you can eat at each meal.  · Eat 4-6 ounces (oz) of lean protein each day, such as lean meat, chicken, fish, eggs, or tofu. One oz of lean protein is equal to:  ? 1 oz of meat, chicken, or fish.  ? 1 egg.  ? ¼ cup of tofu.  · Eat some foods each day that contain healthy fats, such as avocado, nuts, seeds, and fish.  Lifestyle  · Check your blood glucose regularly.  · Exercise regularly as told by your health care provider. This may include:  ? 150 minutes of moderate-intensity or vigorous-intensity exercise each week. This could be brisk walking, biking, or water aerobics.  ? Stretching and doing strength exercises, such as yoga or weightlifting, at least 2 times a week.  · Take medicines as told by your health care provider.  · Do not use any products that contain nicotine or tobacco, such as cigarettes and e-cigarettes. If you need help quitting, ask your health care provider.  · Work with a counselor or diabetes educator to identify strategies to manage stress and any emotional and social challenges.  Questions to ask a health care provider  · Do I need to meet with a diabetes educator?  · Do I need to meet with a dietitian?  · What number can I call if I have questions?  · When are the best times to check my blood glucose?  Where to find more  information:  · American Diabetes Association: diabetes.org  · Academy of Nutrition and Dietetics: www.eatright.org  · National Institute of Diabetes and Digestive and Kidney Diseases (NIH): www.niddk.nih.gov  Summary  · A healthy meal plan will help you control your blood glucose and maintain a healthy lifestyle.  · Working with a diet and nutrition specialist (dietitian) can help you make a meal plan that is best for you.  · Keep in mind that carbohydrates (carbs) and alcohol have immediate effects on your blood glucose levels. It is important to count carbs and to use alcohol carefully.  This information is not intended to   replace advice given to you by your health care provider. Make sure you discuss any questions you have with your health care provider.  Document Released: 07/30/2005 Document Revised: 06/02/2017 Document Reviewed: 12/07/2016  Elsevier Interactive Patient Education © 2019 [employer]

## 2019-01-11 NOTE — Progress Notes (Signed)
[facility] [place], [place] [address]  Internal MEDICINE  Office Visit Note  Patient Name: Chelsey [name]  [id number]  [id number]  Date of Service: 01/29/2019   Complaints/HPI Pt is here for establishment of PCP. Chief Complaint  Patient presents with  . Diabetes    New patient establish care  8.8 last A1c 11/2018  . Hypertension  . Hypothyroidism  . Anemia   HPI Pt is here to establish care.  She is a well appearing 68 yo AA female.  She has a history of DM, HTN, Hypthyroid and anemia.  Her blood pressures currently well controlled.  108/64 today.  Reports her blood sugars have been elevated and she is not sure why is her diet has not changed.  Her most recent 1C was 8.8.  It is of note that on her most recent labs her glucose was 325 mg/dL.  Also her creatinine was 1.40.  This is down from a previous blood draw at 1.70.  I believe some of her medications may be causing this.   Current Medication: Outpatient Encounter Medications as of 01/11/2019  Medication Sig Note  . albuterol (PROAIR HFA) 108 (90 Base) MCG/ACT inhaler Inhale 2 puffs into the lungs every 6 (six) hours as needed for wheezing or shortness of breath.   . allopurinol (ZYLOPRIM) 300 MG tablet Take 300 mg by mouth 2 (two) times daily.    Marland Kitchen amLODipine (NORVASC) 10 MG tablet TAKE 1 TABLET BY MOUTH DAILY   . aspirin EC 81 MG tablet Take 81 mg by mouth daily.   . Blood Glucose Monitoring Suppl (ACCU-CHEK AVIVA PLUS) W/DEVICE KIT Use as directed. 12/20/2013: Received from: External Pharmacy  . COLCRYS 0.6 MG tablet Take 0.6 mg by mouth 2 (two) times daily as needed (for gout flares.).  09/20/2013: Received from: External Pharmacy  . empagliflozin (JARDIANCE) 10 MG TABS tablet Take by mouth.   . ferrous sulfate 325 (65 FE) MG tablet Take 325 mg by mouth daily with breakfast.   . fluticasone (FLONASE) 50 MCG/ACT nasal spray Frequency:QD   Dosage:50   MCG  Instructions:  Note:2 sprays each nostril daily  Dose: 50MCG 08/21/2016: Received from: [facility]  . Fluticasone-Salmeterol (ADVAIR) 250-50 MCG/DOSE AEPB Inhale 1 puff into the lungs 2 (two) times daily.   . furosemide (LASIX) 40 MG tablet TAKE 1 TABLET BY MOUTH EVERY DAY   . gabapentin (NEURONTIN) 300 MG capsule Take 300 mg by mouth 2 (two) times daily.    Marland Kitchen glucose blood test strip 1 each daily. Use 1 test strip daily. 09/20/2013: Received from: External Pharmacy  . hydroxychloroquine (PLAQUENIL) 200 MG tablet TAKE 1 TABLET BY MOUTH TWICE A DAY   . Insulin Pen Needle (FIFTY50 PEN NEEDLES) 31G X 8 MM MISC USE THREE TIMES A DAY   . LANCETS ULTRA THIN MISC Apply 1 each topically daily.  09/20/2013: Received from: External Pharmacy  . lisinopril (PRINIVIL,ZESTRIL) 40 MG tablet Take 40 mg by mouth daily.    . metFORMIN (GLUCOPHAGE) 1000 MG tablet Take 1,000 mg by mouth 2 (two) times daily with a meal.   . omeprazole (PRILOSEC) 20 MG capsule Take 20 mg by mouth 2 (two) times daily before a meal.  09/20/2013: Received from: External Pharmacy  . OXYGEN Inhale 3 L into the lungs. Nighttime use   . polyethylene glycol (MIRALAX / GLYCOLAX) packet Take 17 g by mouth daily.   . potassium chloride SA (K-DUR,KLOR-CON) 20 MEQ tablet TAKE 1 TABLET  BY MOUTH DAILY   . simvastatin (ZOCOR) 20 MG tablet Take 20 mg by mouth daily with lunch.  12/20/2013: Received from: External Pharmacy  . tretinoin (RETIN-A) 0.025 % cream Apply topically.   . triamcinolone cream (KENALOG) 0.1 % Apply twice a day to affected areas of dry itchy skin until clear   . [DISCONTINUED] insulin lispro (HUMALOG KWIKPEN) 100 UNIT/ML KwikPen Inject into the skin.   . [DISCONTINUED] LANTUS SOLOSTAR 100 UNIT/ML Solostar Pen    . Dulaglutide (TRULICITY) 1.5 FG/1.8EX SOPN Inject 1.5 mg into the skin once a week.   Marland Kitchen glimepiride (AMARYL) 2 MG tablet Take 1 tablet (2 mg total) by mouth daily before breakfast.   . hydrALAZINE (APRESOLINE) 10 MG tablet Take 1 tablet (10 mg total) by mouth 3 (three)  times daily.   . [DISCONTINUED] albuterol (PROAIR HFA) 108 (90 BASE) MCG/ACT inhaler Inhale 2 puffs into the lungs every 6 (six) hours as needed. For wheezing or shortness of breath. 07/06/2015: Received from: [place]: Inhale into the lungs.  . [DISCONTINUED] betamethasone acetate-betamethasone sodium phosphate (CELESTONE) 6 (3-3) MG/ML injection Inject 3 mg into the muscle.   . [DISCONTINUED] Blood Glucose Calibration (TAI DOC CONTROL) NORMAL SOLN  09/20/2013: Received from: External Pharmacy  . [DISCONTINUED] diclofenac sodium (VOLTAREN) 1 % GEL    . [DISCONTINUED] hydrALAZINE (APRESOLINE) 10 MG tablet Take 1 tablet (10 mg total) by mouth 3 (three) times daily.   . [DISCONTINUED] HYDROcodone-acetaminophen (NORCO) 7.5-325 MG tablet Take 1-2 tablets by mouth every 4 (four) hours as needed for moderate pain. (Patient not taking: Reported on 01/11/2019)   . [DISCONTINUED] levothyroxine (SYNTHROID, LEVOTHROID) 88 MCG tablet Take 88 mcg by mouth daily before breakfast.  09/20/2013: Received from: External Pharmacy  . [DISCONTINUED] metFORMIN (GLUCOPHAGE) 1000 MG tablet 1,000 mg 2 (two) times daily with a meal.  09/20/2013: Received from: External Pharmacy   Facility-Administered Encounter Medications as of 01/11/2019  Medication  . betamethasone acetate-betamethasone sodium phosphate (CELESTONE) injection 3 mg    Surgical History: Past Surgical History:  Procedure Laterality Date  . BRAIN SURGERY  1990's  . CATARACT EXTRACTION W/PHACO Left 06/11/2016   Procedure: CATARACT EXTRACTION PHACO AND INTRAOCULAR LENS PLACEMENT (IOC);  Surgeon: Birder Robson, MD;  Location: [facility] ORS;  Service: Ophthalmology;  Laterality: Left;  [place] 1.01 AP% 21.6 CDE 13.23 Fluid pack lot # [id number]  . CEREBRAL ANEURYSM REPAIR  1990's   COILS  . CORONARY ARTERY BYPASS GRAFT    . EYE SURGERY  2000's   bilateral cataract  . FRACTURE SURGERY    . HERNIA REPAIR  2000   ventral  . STENT PLACEMENT VASCULAR  ([place] HX)    . VEIN BYPASS SURGERY    . VENTRAL HERNIA REPAIR N/A 04/29/2016   Procedure: Laparoscopic HERNIA REPAIR VENTRAL ADULT;  Surgeon: Jules Husbands, MD;  Location: [facility] ORS;  Service: General;  Laterality: N/A;    Medical History: Past Medical History:  Diagnosis Date  . Anemia   . Aneurysm ([place])   . Arthritis    RHEUMATOID ARTHRITIS  . Asthma   . Collagen vascular disease ([place])   . COPD (chronic obstructive pulmonary disease) ([place])   . Coronary artery disease   . Diabetes mellitus without complication ([place])   . Edema    FEET/LEGS  . GERD (gastroesophageal reflux disease)   . Gout   . H/O wheezing   . History of hiatal hernia   . Hypertension   . Hypothyroidism   . Neuropathy   .  Oxygen deficiency    HS  . Peripheral vascular disease ([place])   . Sarcoidosis of lung ([place])   . 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 Sleep apnea    OXYGEN AT NIGHT 3 L [place]    Family History: Family History  Problem Relation Age of Onset  . Heart disease Mother   . Hypertension Mother   . Diabetes Mother   . Diabetes Father   . Heart disease Father   . Hypertension Father     Social History   Socioeconomic History  . Marital status: Single    Spouse name: Not on file  . Number of children: Not on file  . Years of education: Not on file  . Highest education level: Not on file  Occupational History  . Not on file  Social Needs  . Financial resource strain: Not on file  . Food insecurity:    Worry: Not on file    Inability: Not on file  . Transportation needs:    Medical: Not on file    Non-medical: Not on file  Tobacco Use  . Smoking status: Former Smoker    Packs/day: 1.00    Years: 30.00    Pack years: 30.00    Types: Cigarettes    Last attempt to quit: 01/27/2006    Years since quitting: 13.0  . Smokeless tobacco: Never Used  . Tobacco comment: quit   Substance and Sexual Activity  . Alcohol use: No    Alcohol/week: 0.0 standard drinks   . Drug use: No  . Sexual activity: Not on file  Lifestyle  . Physical activity:    Days per week: Not on file    Minutes per session: Not on file  . Stress: Not on file  Relationships  . Social connections:    Talks on phone: Not on file    Gets together: Not on file    Attends religious service: Not on file    Active member of club or organization: Not on file    Attends meetings of clubs or organizations: Not on file    Relationship status: Not on file  . Intimate partner violence:    Fear of current or ex partner: Not on file    Emotionally abused: Not on file    Physically abused: Not on file    Forced sexual activity: Not on file  Other Topics Concern  . Not on file  Social History Narrative  . Not on file     Review of Systems  Constitutional: Negative for chills, fatigue and unexpected weight change.  HENT: Negative for congestion, rhinorrhea, sneezing and sore throat.   Eyes: Negative for photophobia, pain and redness.  Respiratory: Negative for cough, chest tightness and shortness of breath.   Cardiovascular: Negative for chest pain and palpitations.  Gastrointestinal: Negative for abdominal pain, constipation, diarrhea, nausea and vomiting.  Endocrine: Negative.   Genitourinary: Negative for dysuria and frequency.  Musculoskeletal: Negative for arthralgias, back pain, joint swelling and neck pain.  Skin: Negative for rash.  Allergic/Immunologic: Negative.   Neurological: Negative for tremors and numbness.  Hematological: Negative for adenopathy. Does not bruise/bleed easily.  Psychiatric/Behavioral: Negative for behavioral problems and sleep disturbance. The patient is not nervous/anxious.     Vital Signs: BP 108/64 (BP Location: Left Arm, Patient Position: Sitting, Cuff Size: Normal)   Pulse 77   Resp 16   Ht 5' 5"  (1.651 m)   Wt 213 lb (  96.6 kg)   SpO2 97%   BMI 35.45 kg/m    Physical Exam Vitals signs and nursing note reviewed.  Constitutional:       General: She is not in acute distress.    Appearance: She is well-developed. She is not diaphoretic.  HENT:     Head: Normocephalic and atraumatic.     Mouth/Throat:     Pharynx: No oropharyngeal exudate.  Eyes:     Pupils: Pupils are equal, round, and reactive to light.  Neck:     Musculoskeletal: Normal range of motion and neck supple.     Thyroid: No thyromegaly.     Vascular: No JVD.     Trachea: No tracheal deviation.  Cardiovascular:     Rate and Rhythm: Normal rate and regular rhythm.     Heart sounds: Normal heart sounds. No murmur. No friction rub. No gallop.   Pulmonary:     Effort: Pulmonary effort is normal. No respiratory distress.     Breath sounds: Normal breath sounds. No wheezing or rales.  Chest:     Chest wall: No tenderness.  Abdominal:     Palpations: Abdomen is soft.     Tenderness: There is no abdominal tenderness. There is no guarding.  Musculoskeletal: Normal range of motion.  Lymphadenopathy:     Cervical: No cervical adenopathy.  Skin:    General: Skin is warm and dry.  Neurological:     Mental Status: She is alert and oriented to person, place, and time.     Cranial Nerves: No cranial nerve deficit.  Psychiatric:        Behavior: Behavior normal.        Thought Content: Thought content normal.        Judgment: Judgment normal.    Assessment/Plan: 1. 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[place]) Stop patient Jardiance as well as metformin.  We have added Trulicity and Amaryl to patient's medication. - US Renal; Future - Dulaglutide (TRULICITY) 1.5 QM/5.7QI SOPN; Inject 1.5 mg into the skin once a week.  Dispense: 5 pen; Refill: 2 - glimepiride (AMARYL) 2 MG tablet; Take 1 tablet (2 mg total) by mouth daily before breakfast.  Dispense: 30 tablet; Refill: 3  2. Elevated serum creatinine Patient will stop taking lisinopril and furosemide in an effort to decrease her serum creatinine.    3. Essential hypertension, benign Start  patient hydralazine 10 mg 3 times daily.  Also order echocardiogram to evaluate cardiac function. - ECHOCARDIOGRAM COMPLETE; Future - hydrALAZINE (APRESOLINE) 10 MG tablet; Take 1 tablet (10 mg total) by mouth 3 (three) times daily.  Dispense: 90 tablet; Refill: 3  4. Sarcoidosis of lung ([place]) Stable at this time continue current medications as prescribed.  5. Mixed hyperlipidemia Most recently panel shows elevated triglycerides and a decreased HDL.  Continue current medications.  Will recheck.  6. Hyperkalemia We will repeat metabolic panel to evaluate for hyperkalemia. - Basic Metabolic Panel (BMET)   General Counseling: Zhania verbalizes understanding of the findings of todays visit and agrees with plan of treatment. I have discussed any further diagnostic evaluation that may be needed or ordered today. We also reviewed her medications today. she has been encouraged to call the office with any questions or concerns that should arise related to todays visit.  Orders Placed This Encounter  Procedures  . US Renal  . Basic Metabolic Panel (BMET)  . ECHOCARDIOGRAM COMPLETE    Meds ordered this encounter  Medications  . DISCONTD: hydrALAZINE (  APRESOLINE) 10 MG tablet    Sig: Take 1 tablet (10 mg total) by mouth 3 (three) times daily.    Dispense:  90 tablet    Refill:  3  . Dulaglutide (TRULICITY) 1.5 GB/3.3QS SOPN    Sig: Inject 1.5 mg into the skin once a week.    Dispense:  5 pen    Refill:  2  . hydrALAZINE (APRESOLINE) 10 MG tablet    Sig: Take 1 tablet (10 mg total) by mouth 3 (three) times daily.    Dispense:  90 tablet    Refill:  3  . glimepiride (AMARYL) 2 MG tablet    Sig: Take 1 tablet (2 mg total) by mouth daily before breakfast.    Dispense:  30 tablet    Refill:  3    Time spent: 30 Minutes   This patient was seen by Orson Gear AGNP-C in Collaboration with Dr Lavera Guise as a part of collaborative care agreement  Kendell Bane AGNP-C Internal  Medicine

## 2019-01-16 ENCOUNTER — Encounter: Payer: Self-pay | Admitting: Adult Health

## 2019-01-16 ENCOUNTER — Ambulatory Visit (INDEPENDENT_AMBULATORY_CARE_PROVIDER_SITE_OTHER): Payer: Medicare Other | Admitting: Adult Health

## 2019-01-16 VITALS — BP 124/76 | HR 80 | Resp 16 | Ht 65.0 in | Wt 213.0 lb

## 2019-01-16 DIAGNOSIS — I10 Essential (primary) hypertension: Secondary | ICD-10-CM | POA: Diagnosis not present

## 2019-01-16 DIAGNOSIS — N186 End stage renal disease: Secondary | ICD-10-CM

## 2019-01-16 DIAGNOSIS — E1122 Type 2 diabetes mellitus with diabetic chronic kidney disease: Secondary | ICD-10-CM

## 2019-01-16 DIAGNOSIS — D860 Sarcoidosis of lung: Secondary | ICD-10-CM

## 2019-01-16 NOTE — Progress Notes (Signed)
[facility], [place] [address]  Pulmonary Sleep Medicine   Office Visit Note  Patient Name: Chelsey [name] DOB: 05-26-1951 MRN [id number]  Date of Service: 01/16/2019  Complaints/HPI: PT is here for follow up on PFT's.  She denies any breathing issues presently. Her PFT shows lung volumes within normal limits.  She does have a moderately decreased DLCO. Pt reports she has been doing well.  She denies any SOB, cough, chest pain or palpitations.  She states that she uses Symbicort and albuterol inhalers as needed. She denies any hospitalizations, or other issues.    ROS  General: (-) fever, (-) chills, (-) night sweats, (-) weakness Skin: (-) rashes, (-) itching,. Eyes: (-) visual changes, (-) redness, (-) itching. Nose and Sinuses: (-) nasal stuffiness or itchiness, (-) postnasal drip, (-) nosebleeds, (-) sinus trouble. Mouth and Throat: (-) sore throat, (-) hoarseness. Neck: (-) swollen glands, (-) enlarged thyroid, (-) neck pain. Respiratory: - cough, (-) bloody sputum, - shortness of breath, - wheezing. Cardiovascular: - ankle swelling, (-) chest pain. Lymphatic: (-) lymph node enlargement. Neurologic: (-) numbness, (-) tingling. Psychiatric: (-) anxiety, (-) depression   Current Medication: Outpatient Encounter Medications as of 01/16/2019  Medication Sig Note  . albuterol (PROAIR HFA) 108 (90 Base) MCG/ACT inhaler Inhale 2 puffs into the lungs every 6 (six) hours as needed for wheezing or shortness of breath.   . allopurinol (ZYLOPRIM) 300 MG tablet Take 300 mg by mouth 2 (two) times daily.    Marland Kitchen amLODipine (NORVASC) 10 MG tablet TAKE 1 TABLET BY MOUTH DAILY   . aspirin EC 81 MG tablet Take 81 mg by mouth daily.   . Blood Glucose Monitoring Suppl (ACCU-CHEK AVIVA PLUS) W/DEVICE KIT Use as directed. 12/20/2013: Received from: External Pharmacy  . COLCRYS 0.6 MG tablet Take 0.6 mg by mouth 2 (two) times daily as needed (for gout flares.).   09/20/2013: Received from: External Pharmacy  . Dulaglutide (TRULICITY) 1.5 UM/3.5TI SOPN Inject 1.5 mg into the skin once a week.   . ferrous sulfate 325 (65 FE) MG tablet Take 325 mg by mouth daily with breakfast.   . fluticasone (FLONASE) 50 MCG/ACT nasal spray Frequency:QD   Dosage:50   MCG  Instructions:  Note:2 sprays each nostril daily Dose: 50MCG 08/21/2016: Received from: [facility]  . Fluticasone-Salmeterol (ADVAIR) 250-50 MCG/DOSE AEPB Inhale 1 puff into the lungs 2 (two) times daily.   . furosemide (LASIX) 40 MG tablet TAKE 1 TABLET BY MOUTH EVERY DAY   . gabapentin (NEURONTIN) 300 MG capsule Take 300 mg by mouth 2 (two) times daily.    Marland Kitchen glimepiride (AMARYL) 2 MG tablet Take 1 tablet (2 mg total) by mouth daily before breakfast.   . glucose blood test strip 1 each daily. Use 1 test strip daily. 09/20/2013: Received from: External Pharmacy  . hydrALAZINE (APRESOLINE) 10 MG tablet Take 1 tablet (10 mg total) by mouth 3 (three) times daily.   . hydroxychloroquine (PLAQUENIL) 200 MG tablet TAKE 1 TABLET BY MOUTH TWICE A DAY   . insulin lispro (HUMALOG KWIKPEN) 100 UNIT/ML KwikPen Inject into the skin.   . Insulin Pen Needle (FIFTY50 PEN NEEDLES) 31G X 8 MM MISC USE THREE TIMES A DAY   . LANCETS ULTRA THIN MISC Apply 1 each topically daily.  09/20/2013: Received from: External Pharmacy  . LANTUS SOLOSTAR 100 UNIT/ML Solostar Pen    . lisinopril (PRINIVIL,ZESTRIL) 40 MG tablet Take 40 mg by mouth daily.    Marland Kitchen  omeprazole (PRILOSEC) 20 MG capsule Take 20 mg by mouth 2 (two) times daily before a meal.  09/20/2013: Received from: External Pharmacy  . OXYGEN Inhale 3 L into the lungs. Nighttime use   . polyethylene glycol (MIRALAX / GLYCOLAX) packet Take 17 g by mouth daily.   . potassium chloride SA (K-DUR,KLOR-CON) 20 MEQ tablet TAKE 1 TABLET BY MOUTH DAILY   . simvastatin (ZOCOR) 20 MG tablet Take 20 mg by mouth daily with lunch.  12/20/2013: Received from: External Pharmacy  . tretinoin  (RETIN-A) 0.025 % cream Apply topically.   . triamcinolone cream (KENALOG) 0.1 % Apply twice a day to affected areas of dry itchy skin until clear   . empagliflozin (JARDIANCE) 10 MG TABS tablet Take by mouth.   . metFORMIN (GLUCOPHAGE) 1000 MG tablet Take 1,000 mg by mouth 2 (two) times daily with a meal.    Facility-Administered Encounter Medications as of 01/16/2019  Medication  . betamethasone acetate-betamethasone sodium phosphate (CELESTONE) injection 3 mg    Surgical History: Past Surgical History:  Procedure Laterality Date  . BRAIN SURGERY  1990's  . CATARACT EXTRACTION W/PHACO Left 06/11/2016   Procedure: CATARACT EXTRACTION PHACO AND INTRAOCULAR LENS PLACEMENT (IOC);  Surgeon: Birder Robson, MD;  Location: [facility] ORS;  Service: Ophthalmology;  Laterality: Left;  [place] 1.01 AP% 21.6 CDE 13.23 Fluid pack lot # [id number]  . CEREBRAL ANEURYSM REPAIR  1990's   COILS  . CORONARY ARTERY BYPASS GRAFT    . EYE SURGERY  2000's   bilateral cataract  . FRACTURE SURGERY    . HERNIA REPAIR  2000   ventral  . STENT PLACEMENT VASCULAR ([place] HX)    . VEIN BYPASS SURGERY    . VENTRAL HERNIA REPAIR N/A 04/29/2016   Procedure: Laparoscopic HERNIA REPAIR VENTRAL ADULT;  Surgeon: Jules Husbands, MD;  Location: [facility] ORS;  Service: General;  Laterality: N/A;    Medical History: Past Medical History:  Diagnosis Date  . Anemia   . Aneurysm ([place])   . Arthritis    RHEUMATOID ARTHRITIS  . Asthma   . Collagen vascular disease ([place])   . COPD (chronic obstructive pulmonary disease) ([place])   . Coronary artery disease   . Diabetes mellitus without complication ([place])   . Edema    FEET/LEGS  . GERD (gastroesophageal reflux disease)   . Gout   . H/O wheezing   . History of hiatal hernia   . Hypertension   . Hypothyroidism   . Neuropathy   . Oxygen deficiency    HS  . Peripheral vascular disease ([place])   . Sarcoidosis of lung ([place])   . Seizures ([place])    WITH BRAIN 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    . Sleep apnea    OXYGEN AT NIGHT 3 L [place]    Family History: Family History  Problem Relation Age of Onset  . Heart disease Mother   . Hypertension Mother   . Diabetes Mother   . Diabetes Father   . Heart disease Father   . Hypertension Father     Social History: Social History   Socioeconomic History  . Marital status: Single    Spouse name: Not on file  . Number of children: Not on file  . Years of education: Not on file  . Highest education level: Not on file  Occupational History  . Not on file  Social Needs  . Financial resource strain: Not on file  . Food insecurity:    Worry: Not on file  Inability: Not on file  . Transportation needs:    Medical: Not on file    Non-medical: Not on file  Tobacco Use  . Smoking status: Former Smoker    Packs/day: 1.00    Years: 30.00    Pack years: 30.00    Types: Cigarettes    Last attempt to quit: 01/27/2006    Years since quitting: 12.9  . Smokeless tobacco: Never Used  . Tobacco comment: quit   Substance and Sexual Activity  . Alcohol use: No    Alcohol/week: 0.0 standard drinks  . Drug use: No  . Sexual activity: Not on file  Lifestyle  . Physical activity:    Days per week: Not on file    Minutes per session: Not on file  . Stress: Not on file  Relationships  . Social connections:    Talks on phone: Not on file    Gets together: Not on file    Attends religious service: Not on file    Active member of club or organization: Not on file    Attends meetings of clubs or organizations: Not on file    Relationship status: Not on file  . Intimate partner violence:    Fear of current or ex partner: Not on file    Emotionally abused: Not on file    Physically abused: Not on file    Forced sexual activity: Not on file  Other Topics Concern  . Not on file  Social History Narrative  . Not on file    Vital Signs: Height 5' 5"  (1.651 m), weight 213 lb (96.6 kg).  Examination: General Appearance: The patient  is well-developed, well-nourished, and in no distress. Skin: Gross inspection of skin unremarkable. Head: normocephalic, no gross deformities. Eyes: no gross deformities noted. ENT: ears appear grossly normal no exudates. Neck: Supple. No thyromegaly. No LAD. Respiratory: clear bilateraly. Cardiovascular: Normal S1 and S2 without murmur or rub. Extremities: No cyanosis. pulses are equal. Neurologic: Alert and oriented. No involuntary movements.  LABS: Recent Results (from the past 2160 hour(s))  POCT HgB A1C     Status: Abnormal   Collection Time: 12/06/18  9:50 AM  Result Value Ref Range   Hemoglobin A1C 8.8 (A) 4.0 - 5.6 %   HbA1c POC (<> result, manual entry)     HbA1c, POC (prediabetic range)     HbA1c, POC (controlled diabetic range)    Pulmonary Function Test     Status: None   Collection Time: 12/14/18  9:00 AM  Result Value Ref Range   FEV1     FVC     FEV1/FVC     TLC     DLCO    Comprehensive metabolic panel     Status: Abnormal   Collection Time: 12/28/18  8:41 AM  Result Value Ref Range   Glucose 265 (H) 65 - 99 mg/dL   BUN 47 (H) 8 - 27 mg/dL   Creatinine, Ser 1.70 (H) 0.57 - 1.00 mg/dL   GFR calc non Af Amer 31 (L) >59 mL/min/1.73   GFR calc Af Amer 35 (L) >59 mL/min/1.73   BUN/Creatinine Ratio 28 12 - 28   Sodium 138 134 - 144 mmol/L   Potassium 5.3 (H) 3.5 - 5.2 mmol/L   Chloride 102 96 - 106 mmol/L   CO2 17 (L) 20 - 29 mmol/L   Calcium 9.3 8.7 - 10.3 mg/dL   Total Protein 6.9 6.0 - 8.5 g/dL   Albumin 4.3 3.8 - 4.8  g/dL    Comment:               **Please note reference interval change**   Globulin, Total 2.6 1.5 - 4.5 g/dL   Albumin/Globulin Ratio 1.7 1.2 - 2.2   Bilirubin Total 0.4 0.0 - 1.2 mg/dL   Alkaline Phosphatase 145 (H) 39 - 117 IU/L   AST 29 0 - 40 IU/L   ALT 23 0 - 32 IU/L  CBC with Differential/Platelet     Status: Abnormal   Collection Time: 12/28/18  8:41 AM  Result Value Ref Range   WBC 4.2 3.4 - 10.8 x10E3/uL   RBC 4.51 3.77 -  5.28 x10E6/uL   Hemoglobin 12.4 11.1 - 15.9 g/dL   Hematocrit 37.9 34.0 - 46.6 %   MCV 84 79 - 97 fL   MCH 27.5 26.6 - 33.0 pg   MCHC 32.7 31.5 - 35.7 g/dL   RDW 16.1 (H) 11.7 - 15.4 %   Platelets 199 150 - 450 x10E3/uL   Neutrophils 57 Not Estab. %   Lymphs 27 Not Estab. %   Monocytes 7 Not Estab. %   Eos 7 Not Estab. %   Basos 1 Not Estab. %   Neutrophils Absolute 2.4 1.4 - 7.0 x10E3/uL   Lymphocytes Absolute 1.1 0.7 - 3.1 x10E3/uL   Monocytes Absolute 0.3 0.1 - 0.9 x10E3/uL   EOS (ABSOLUTE) 0.3 0.0 - 0.4 x10E3/uL   Basophils Absolute 0.0 0.0 - 0.2 x10E3/uL   Immature Granulocytes 1 Not Estab. %   Immature Grans (Abs) 0.0 0.0 - 0.1 x10E3/uL  Lipid Panel With LDL/HDL Ratio     Status: Abnormal   Collection Time: 12/28/18  8:41 AM  Result Value Ref Range   Cholesterol, Total 126 100 - 199 mg/dL   Triglycerides 240 (H) 0 - 149 mg/dL   HDL 29 (L) >39 mg/dL   VLDL Cholesterol Cal 48 (H) 5 - 40 mg/dL   LDL Calculated 49 0 - 99 mg/dL   LDl/HDL Ratio 1.7 0.0 - 3.2 ratio    Comment:                                     LDL/HDL Ratio                                             Men  Women                               1/2 Avg.Risk  1.0    1.5                                   Avg.Risk  3.6    3.2                                2X Avg.Risk  6.2    5.0                                3X Avg.Risk  8.0    6.1   T4, free  Status: None   Collection Time: 12/28/18  8:41 AM  Result Value Ref Range   Free T4 0.90 0.82 - 1.77 ng/dL  TSH     Status: Abnormal   Collection Time: 12/28/18  8:41 AM  Result Value Ref Range   TSH 5.490 (H) 0.450 - 4.500 uIU/mL  ANA w/Reflex if Positive     Status: Abnormal   Collection Time: 12/28/18  8:41 AM  Result Value Ref Range   Anti Nuclear Antibody(ANA) Positive (A) Negative   dsDNA Ab <1 0 - 9 IU/mL    Comment:                                    Negative      <5                                    Equivocal  5 - 9                                     Positive      >9    ENA RNP Ab <0.2 0.0 - 0.9 AI   ENA SM Ab Ser-aCnc <0.2 0.0 - 0.9 AI   Scleroderma SCL-70 <0.2 0.0 - 0.9 AI   ENA SSA (RO) Ab <0.2 0.0 - 0.9 AI   ENA SSB (LA) Ab 1.5 (H) 0.0 - 0.9 AI   Chromatin Ab SerPl-aCnc <0.2 0.0 - 0.9 AI   Anti JO-1 <0.2 0.0 - 0.9 AI   Centromere Ab Screen <0.2 0.0 - 0.9 AI   See below: Comment     Comment: Autoantibody                       Disease Association ------------------------------------------------------------                         Condition                  Frequency ---------------------   ------------------------   --------- Antinuclear Antibody,    SLE, mixed connective Direct (ANA-D)           tissue diseases ---------------------   ------------------------   --------- dsDNA                    SLE                        40 - 60% ---------------------   ------------------------   --------- Chromatin                Drug induced SLE                90%                          SLE                        48 - 97% ---------------------   ------------------------   --------- SSA (Ro)                 SLE  25 - 35%                          Sjogren's Syndrome         40 - 70%                          Neonatal Lupus                 100% ---------------------   ------------------------   --------- SSB (La)                 SLE                              10%                          Sjogren's Syndrome              30% ---------------------   -----------------------    --------- Sm (anti-Smith)          SLE                        15 - 30% ---------------------   -----------------------    --------- RNP                      Mixed Connective Tissue                          Disease                         95% (U1 nRNP,                SLE                        30 - 50% anti-ribonucleoprotein)  Polymyositis and/or                          Dermatomyositis                 20% ---------------------    ------------------------   --------- Scl-70 (antiDNA          Scleroderma (diffuse)      20 - 35% topoisomerase)           [place]                           13% ---------------------   ------------------------   --------- Jo-1                     Polymyositis and/or                          Dermatomyositis            20 - 40% ---------------------   ------------------------   --------- Centromere B             Scleroderma -  Crest                          variant  80%   Sedimentation rate     Status: None   Collection Time: 12/28/18  8:41 AM  Result Value Ref Range   Sed Rate 7 0 - 40 mm/hr    Radiology: No results found.  No results found.  No results found.    Assessment and Plan: Patient Active Problem List   Diagnosis Date Noted  . Chronic pain of left wrist 03/04/2017  . Chronic pain of left ankle 11/03/2016  . Chronic pain of left knee 11/03/2016  . Right renal mass 11/03/2016  . Hyperlipemia 07/29/2016  . Recurrent ventral hernia   . Ventral hernia without obstruction or gangrene 03/25/2016  . Foot pain 12/10/2015  . Chronic gouty arthropathy without tophi 12/10/2015  . 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 Sarcoidosis of lung ([place]) 12/10/2015  . Sarcoidosis, nodular type 12/10/2015  . Chronic foot pain, left 12/10/2015  . Chronic idiopathic gout of multiple sites 08/05/2015  . Sleep related hypoventilation/hypoxemia in other disease 04/29/2015  . NAFLD (nonalcoholic fatty liver disease) 10/10/2014  . Hypothyroidism due to acquired atrophy of thyroid 07/11/2014  . Type 2 diabetes mellitus with complication ([place]) 81/82/9937  . Primary localized osteoarthrosis, lower leg 10/21/2012  . Sarcoidosis 05/24/2008  . Peripheral vascular disease ([place]) 02/15/2007  . Hypertension 07/03/1999    1. Sarcoidosis of lung (HCC) Stable, PFT within normal limits.  Follow up in 6 months.  Continue to use inhalers.   2. Essential hypertension,  benign Stable, continue current therapy.   3. 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  Currently in transition, changing medications  General Counseling: I have discussed the findings of the evaluation and examination with [facility].  I have also discussed any further diagnostic evaluation thatmay be needed or ordered today. Kariah verbalizes understanding of the findings of todays visit. We also reviewed her medications today and discussed drug interactions and side effects including but not limited excessive drowsiness and altered mental states. We also discussed that there is always a risk not just to her but also people around her. she has been encouraged to call the office with any questions or concerns that should arise related to todays visit.    Time spent: 25 This patient was seen by Orson Gear AGNP-C in Collaboration with Dr. Devona Konig as a part of collaborative care agreement.   I have personally obtained a history, examined the patient, evaluated laboratory and imaging results, formulated the assessment and plan and placed orders.    Allyne Gee, MD [facility] Pulmonary and Critical Care Sleep medicine

## 2019-01-16 NOTE — Patient Instructions (Signed)
Sarcoidosis  Sarcoidosis is a disease that can cause inflammation in many areas of the body. It most often affects the lungs (pulmonary sarcoidosis). Sarcoidosis can also affect the lymph nodes, liver, eyes, skin, heart, or any other body tissue. Normally, cells that are part of your body's disease-fighting system (immune system) attack harmful substances (such as germs) in your body. This immune system response causes inflammation. After the harmful substance is destroyed, the inflammation and the immune cells go away. When you have sarcoidosis, your immune system causes inflammation even when there are no harmful substances, and the inflammation does not go away. Sarcoidosis also causes cells from your immune system to form small clumps of tissue (granulomas) in the affected area of your body. What are the causes? The exact cause of sarcoidosis is not known.  It is possible that if you have a family history of this disease (genetic predisposition), the immune system response that leads to inflammation may be triggered by something in your environment, such as:  Bacteria or viruses.  Metals.  Chemicals.  Dust.  Mold or mildew. What increases the risk? You may be at a greater risk for sarcoidosis if you:  Have a family history of the disease.  Are African-American.  Are of Northern European descent.  Are 79-54 years old.  Work as a Airline pilot.  Work in an environment where you are exposed to metals, chemicals, mold or mildew, or insecticides. What are the signs or symptoms? Some people with sarcoidosis have no symptoms. Others have very mild symptoms. The symptoms usually depend on the organ that is affected. Sarcoidosis most often affects the lungs, which may include symptoms such as:  Chest pain.  Coughing.  Wheezing.  Shortness of breath. Other common symptoms include:  Night sweats.  Fever.  Weight loss.  Fatigue.  Swollen lymph nodes.  Joint pain. How is  this diagnosed? Sarcoidosis may be diagnosed based on:  Your symptoms and medical history.  A physical exam.  Imaging tests to check for granulomas such as: ? Chest X-ray. ? CT scan. ? MRI. ? PET scan.  Lung function tests. These tests evaluate your breathing and check for problems that may be related to sarcoidosis.  A procedure to remove a tissue sample for testing (biopsy). You may have a biopsy of lung tissue if that is where you are having symptoms. You may have tests to check for any complications of the condition. These tests may include:  Eye exams.  MRI of the heart or brain.  Echocardiogram.  Electrocardiogram (EKG or ECG). How is this treated? In some cases, sarcoidosis does not require a specific treatment because it causes no symptoms or mild symptoms. If your symptoms bother you or are severe, you may be prescribed medicines to reduce inflammation or relieve symptoms. These medicines may include:  Prednisone. This is a steroid that reduces inflammation related to sarcoidosis.  Hydroxychloroquine. This may be used to treat sarcoidosis that affects the skin, eyes, or brain.  Methotrexate, leflunomide, or azathioprine. These medicines affect the immune system and can help with sarcoidosis in the joints, eyes, skin, or lungs.  Medicines that you breathe in (inhalers). Inhalers can help you breathe if sarcoidosis affects your lungs. Follow these instructions at home:   Do not use any products that contain nicotine or tobacco, such as cigarettes and e-cigarettes. If you need help quitting, ask your health care provider.  Avoid secondhand smoke and irritating dust or chemicals. Stay indoors on days when air quality is poor  in your area.  Return to your normal activities as told by your health care provider. Ask your health care provider what activities are safe for you.  Take or use over-the-counter and prescription medicines only as told by your health care  provider.  Keep all follow-up visits as told by your health care provider. This is important. Contact a health care provider if:  You have vision problems.  You have a dry cough that does not go away.  You have an irregular heartbeat.  You have pain or aches in your joints, hands, or feet.  You have an unexplained rash. Get help right away if:  You have chest pain.  You have difficulty breathing. Summary  Sarcoidosis is a disease that can cause inflammation in many body areas of the body. It most often affects the lungs (pulmonary sarcoidosis). It can also affect the lymph nodes, liver, eyes, skin, heart, or any other body tissue.  When you have sarcoidosis, cells from your immune system form small clumps of tissue (granulomas) in the affected area of your body.  Sarcoidosis sometimes does not require a specific treatment because it causes no symptoms or mild symptoms.  If your symptoms bother you or are severe, you may be prescribed medicines to reduce inflammation or relieve symptoms. This information is not intended to replace advice given to you by your health care provider. Make sure you discuss any questions you have with your health care provider. Document Released: 09/02/2004 Document Revised: 08/10/2017 Document Reviewed: 08/10/2017 Elsevier Interactive Patient Education  2019 [employer].

## 2019-01-17 ENCOUNTER — Other Ambulatory Visit: Payer: Self-pay

## 2019-01-17 ENCOUNTER — Other Ambulatory Visit: Payer: Self-pay | Admitting: Adult Health

## 2019-01-17 ENCOUNTER — Ambulatory Visit: Payer: Self-pay | Admitting: Internal Medicine

## 2019-01-17 DIAGNOSIS — E1121 Type 2 diabetes mellitus with diabetic nephropathy: Secondary | ICD-10-CM

## 2019-01-17 LAB — BASIC METABOLIC PANEL
BUN / CREAT RATIO: 20 (ref 12–28)
BUN: 28 mg/dL — AB (ref 8–27)
CO2: 18 mmol/L — ABNORMAL LOW (ref 20–29)
Calcium: 9.4 mg/dL (ref 8.7–10.3)
Chloride: 103 mmol/L (ref 96–106)
Creatinine, Ser: 1.4 mg/dL — ABNORMAL HIGH (ref 0.57–1.00)
GFR calc Af Amer: 45 mL/min/{1.73_m2} — ABNORMAL LOW (ref >59)
GFR calc non Af Amer: 39 mL/min/{1.73_m2} — ABNORMAL LOW (ref >59)
Glucose: 301 mg/dL — ABNORMAL HIGH (ref 65–99)
Potassium: 4.6 mmol/L (ref 3.5–5.2)
Sodium: 138 mmol/L (ref 134–144)

## 2019-01-17 MED ORDER — LANTUS SOLOSTAR 100 UNIT/ML ~~LOC~~ SOPN: 65.0000 [IU] | PEN_INJECTOR | Freq: Every day | SUBCUTANEOUS | 3 refills | Status: DC

## 2019-01-17 MED ORDER — INSULIN LISPRO (1 UNIT DIAL) 100 UNIT/ML (KWIKPEN): PEN_INJECTOR | SUBCUTANEOUS | 3 refills | Status: DC

## 2019-01-17 NOTE — Telephone Encounter (Signed)
Spoke with patient she was needing refills on her lantus and humalog, Per Adam she is to take 10 units of humalog with a meal if her sugars are high

## 2019-01-20 ENCOUNTER — Encounter: Payer: Self-pay | Admitting: Adult Health

## 2019-01-20 ENCOUNTER — Ambulatory Visit (INDEPENDENT_AMBULATORY_CARE_PROVIDER_SITE_OTHER): Payer: Medicare Other | Admitting: Adult Health

## 2019-01-20 VITALS — BP 148/86 | HR 78 | Resp 16 | Ht 65.0 in | Wt 216.0 lb

## 2019-01-20 DIAGNOSIS — I10 Essential (primary) hypertension: Secondary | ICD-10-CM

## 2019-01-20 DIAGNOSIS — N182 Chronic kidney disease, stage 2 (mild): Secondary | ICD-10-CM

## 2019-01-20 DIAGNOSIS — E1122 Type 2 diabetes mellitus with diabetic chronic kidney disease: Secondary | ICD-10-CM

## 2019-01-20 DIAGNOSIS — Z794 Long term (current) use of insulin: Secondary | ICD-10-CM

## 2019-01-20 DIAGNOSIS — E782 Mixed hyperlipidemia: Secondary | ICD-10-CM

## 2019-01-20 NOTE — Progress Notes (Signed)
[facility] [place], [place] [address]  Internal MEDICINE  Office Visit Note  Patient Name: Chelsey [name]  [id number]  [id number]  Date of Service: 01/20/2019  Chief Complaint  Patient presents with  . Diabetes    follow up med change and labs ,elevated bp due to not taken her medication yet ,bs this morning 212  . Quality Metric Gaps    colonoscopy, eye exam     HPI  Pt is here for follow up.  She reports she has stopped the medications we discussed stopping.  She had her labs redrawn and her kidney numbers are improving.  She has needed some novolog 10 units with each meal when her blood sugar is over 132m/dl.  Overall she feels like she is doing well.    Current Medication: Outpatient Encounter Medications as of 01/20/2019  Medication Sig Note  . albuterol (PROAIR HFA) 108 (90 Base) MCG/ACT inhaler Inhale 2 puffs into the lungs every 6 (six) hours as needed for wheezing or shortness of breath.   . allopurinol (ZYLOPRIM) 300 MG tablet Take 300 mg by mouth 2 (two) times daily.    .Marland KitchenamLODipine (NORVASC) 10 MG tablet TAKE 1 TABLET BY MOUTH DAILY   . aspirin EC 81 MG tablet Take 81 mg by mouth daily.   . Blood Glucose Monitoring Suppl (ACCU-CHEK AVIVA PLUS) W/DEVICE KIT Use as directed. 12/20/2013: Received from: External Pharmacy  . COLCRYS 0.6 MG tablet Take 0.6 mg by mouth 2 (two) times daily as needed (for gout flares.).  09/20/2013: Received from: External Pharmacy  . Dulaglutide (TRULICITY) 1.5 MYI/9.4WNSOPN Inject 1.5 mg into the skin once a week.   . empagliflozin (JARDIANCE) 10 MG TABS tablet Take by mouth.   . ferrous sulfate 325 (65 FE) MG tablet Take 325 mg by mouth daily with breakfast.   . fluticasone (FLONASE) 50 MCG/ACT nasal spray Frequency:QD   Dosage:50   MCG  Instructions:  Note:2 sprays each nostril daily Dose: 50MCG 08/21/2016: [facility] . Fluticasone-Salmeterol (ADVAIR) 250-50 MCG/DOSE AEPB Inhale 1 puff into the lungs  2 (two) times daily.   . furosemide (LASIX) 40 MG tablet TAKE 1 TABLET BY MOUTH EVERY DAY   . gabapentin (NEURONTIN) 300 MG capsule Take 300 mg by mouth 2 (two) times daily.    .Marland Kitchenglimepiride (AMARYL) 2 MG tablet Take 1 tablet (2 mg total) by mouth daily before breakfast.   . glucose blood test strip 1 each daily. Use 1 test strip daily. 09/20/2013: Received from: External Pharmacy  . hydrALAZINE (APRESOLINE) 10 MG tablet Take 1 tablet (10 mg total) by mouth 3 (three) times daily.   . hydroxychloroquine (PLAQUENIL) 200 MG tablet TAKE 1 TABLET BY MOUTH TWICE A DAY   . insulin lispro (HUMALOG KWIKPEN) 100 UNIT/ML KwikPen 10 units with meal if sugar is high by sliding scale (Patient taking differently: 10 units with each meal if sugar is high by sliding scale  Maximum 60 units)   . Insulin Pen Needle (FIFTY50 PEN NEEDLES) 31G X 8 MM MISC USE THREE TIMES A DAY   . LANCETS ULTRA THIN MISC Apply 1 each topically daily.  09/20/2013: Received from: External Pharmacy  . LANTUS SOLOSTAR 100 UNIT/ML Solostar Pen Inject 65 Units into the skin daily.   .Marland Kitchenlisinopril (PRINIVIL,ZESTRIL) 40 MG tablet Take 40 mg by mouth daily.    . metFORMIN (GLUCOPHAGE) 1000 MG tablet Take 1,000 mg by mouth 2 (two) times daily with  a meal.   . omeprazole (PRILOSEC) 20 MG capsule Take 20 mg by mouth 2 (two) times daily before a meal.  09/20/2013: Received from: External Pharmacy  . OXYGEN Inhale 3 L into the lungs. Nighttime use   . polyethylene glycol (MIRALAX / GLYCOLAX) packet Take 17 g by mouth daily.   . potassium chloride SA (K-DUR,KLOR-CON) 20 MEQ tablet TAKE 1 TABLET BY MOUTH DAILY   . simvastatin (ZOCOR) 20 MG tablet Take 20 mg by mouth daily with lunch.  12/20/2013: Received from: External Pharmacy  . tretinoin (RETIN-A) 0.025 % cream Apply topically.   . triamcinolone cream (KENALOG) 0.1 % Apply twice a day to affected areas of dry itchy skin until clear    Facility-Administered Encounter Medications as of 01/20/2019   Medication  . betamethasone acetate-betamethasone sodium phosphate (CELESTONE) injection 3 mg    Surgical History: Past Surgical History:  Procedure Laterality Date  . BRAIN SURGERY  1990's  . CATARACT EXTRACTION W/PHACO Left 06/11/2016   Procedure: CATARACT EXTRACTION PHACO AND INTRAOCULAR LENS PLACEMENT (IOC);  Surgeon: Birder Robson, MD;  Location: [facility] ORS;  Service: Ophthalmology;  Laterality: Left;  [place] 1.01 AP% 21.6 CDE 13.23 Fluid pack lot # [id number]  . CEREBRAL ANEURYSM REPAIR  1990's   COILS  . CORONARY ARTERY BYPASS GRAFT    . EYE SURGERY  2000's   bilateral cataract  . FRACTURE SURGERY    . HERNIA REPAIR  2000   ventral  . STENT PLACEMENT VASCULAR ([place] HX)    . VEIN BYPASS SURGERY    . VENTRAL HERNIA REPAIR N/A 04/29/2016   Procedure: Laparoscopic HERNIA REPAIR VENTRAL ADULT;  Surgeon: Jules Husbands, MD;  Location: [facility] ORS;  Service: General;  Laterality: N/A;    Medical History: Past Medical History:  Diagnosis Date  . Anemia   . Aneurysm ([place])   . Arthritis    RHEUMATOID ARTHRITIS  . Asthma   . Collagen vascular disease ([place])   . COPD (chronic obstructive pulmonary disease) ([place])   . Coronary artery disease   . Diabetes mellitus without complication ([place])   . Edema    FEET/LEGS  . GERD (gastroesophageal reflux disease)   . Gout   . H/O wheezing   . History of hiatal hernia   . Hypertension   . Hypothyroidism   . Neuropathy   . Oxygen deficiency    HS  . Peripheral vascular disease ([place])   . Sarcoidosis of lung ([place])   . Seizures ([place])    WITH BRAIN 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   . Sleep apnea    OXYGEN AT NIGHT 3 L [place]    Family History: Family History  Problem Relation Age of Onset  . Heart disease Mother   . Hypertension Mother   . Diabetes Mother   . Diabetes Father   . Heart disease Father   . Hypertension Father     Social History   Socioeconomic History  . Marital status: Single    Spouse name: Not on file  . Number of  children: Not on file  . Years of education: Not on file  . Highest education level: Not on file  Occupational History  . Not on file  Social Needs  . Financial resource strain: Not on file  . Food insecurity:    Worry: Not on file    Inability: Not on file  . Transportation needs:    Medical: Not on file    Non-medical: Not on file  Tobacco Use  .  Smoking status: Former Smoker    Packs/day: 1.00    Years: 30.00    Pack years: 30.00    Types: Cigarettes    Last attempt to quit: 01/27/2006    Years since quitting: 12.9  . Smokeless tobacco: Never Used  . Tobacco comment: quit   Substance and Sexual Activity  . Alcohol use: No    Alcohol/week: 0.0 standard drinks  . Drug use: No  . Sexual activity: Not on file  Lifestyle  . Physical activity:    Days per week: Not on file    Minutes per session: Not on file  . Stress: Not on file  Relationships  . Social connections:    Talks on phone: Not on file    Gets together: Not on file    Attends religious service: Not on file    Active member of club or organization: Not on file    Attends meetings of clubs or organizations: Not on file    Relationship status: Not on file  . Intimate partner violence:    Fear of current or ex partner: Not on file    Emotionally abused: Not on file    Physically abused: Not on file    Forced sexual activity: Not on file  Other Topics Concern  . Not on file  Social History Narrative  . Not on file      Review of Systems  Constitutional: Negative for chills, fatigue and unexpected weight change.  HENT: Negative for congestion, rhinorrhea, sneezing and sore throat.   Eyes: Negative for photophobia, pain and redness.  Respiratory: Negative for cough, chest tightness and shortness of breath.   Cardiovascular: Negative for chest pain and palpitations.  Gastrointestinal: Negative for abdominal pain, constipation, diarrhea, nausea and vomiting.  Endocrine: Negative.   Genitourinary: Negative  for dysuria and frequency.  Musculoskeletal: Negative for arthralgias, back pain, joint swelling and neck pain.  Skin: Negative for rash.  Allergic/Immunologic: Negative.   Neurological: Negative for tremors and numbness.  Hematological: Negative for adenopathy. Does not bruise/bleed easily.  Psychiatric/Behavioral: Negative for behavioral problems and sleep disturbance. The patient is not nervous/anxious.     Vital Signs: BP (!) 148/86   Pulse 78   Resp 16   Ht 5' 5"  (1.651 m)   Wt 216 lb (98 kg)   SpO2 95%   BMI 35.94 kg/m    Physical Exam Vitals signs and nursing note reviewed.  Constitutional:      General: She is not in acute distress.    Appearance: She is well-developed. She is not diaphoretic.  HENT:     Head: Normocephalic and atraumatic.     Mouth/Throat:     Pharynx: No oropharyngeal exudate.  Eyes:     Pupils: Pupils are equal, round, and reactive to light.  Neck:     Musculoskeletal: Normal range of motion and neck supple.     Thyroid: No thyromegaly.     Vascular: No JVD.     Trachea: No tracheal deviation.  Cardiovascular:     Rate and Rhythm: Normal rate and regular rhythm.     Heart sounds: Normal heart sounds. No murmur. No friction rub. No gallop.   Pulmonary:     Effort: Pulmonary effort is normal. No respiratory distress.     Breath sounds: Normal breath sounds. No wheezing or rales.  Chest:     Chest wall: No tenderness.  Abdominal:     Palpations: Abdomen is soft.     Tenderness: There  is no abdominal tenderness. There is no guarding.  Musculoskeletal: Normal range of motion.  Lymphadenopathy:     Cervical: No cervical adenopathy.  Skin:    General: Skin is warm and dry.  Neurological:     Mental Status: She is alert and oriented to person, place, and time.     Cranial Nerves: No cranial nerve deficit.  Psychiatric:        Behavior: Behavior normal.        Thought Content: Thought content normal.        Judgment: Judgment normal.     Assessment/Plan: 1. Type 2 diabetes mellitus with stage 2 chronic kidney disease, with long-term current use of insulin (HCC) Continue to use medications as prescribed.  Will recheck A1c in 3 months as scheduled.  2. Essential hypertension, benign Slightly elevated today 148/86 depression for she has not taken her blood pressure medication this morning.  Have instructed her to go home and take her medications.  And we talked about encouraging compliance medication.  3. Mixed hyperlipidemia Most recent lipid panel shows high triglycerides as well as a low HDL.  Patient will continue with Zocor as prescribed.  General Counseling: Alese verbalizes understanding of the findings of todays visit and agrees with plan of treatment. I have discussed any further diagnostic evaluation that may be needed or ordered today. We also reviewed her medications today. she has been encouraged to call the office with any questions or concerns that should arise related to todays visit.    No orders of the defined types were placed in this encounter.   No orders of the defined types were placed in this encounter.   Time spent: 25  Minutes   This patient was seen by Orson Gear AGNP-C in Collaboration with Dr Lavera Guise as a part of collaborative care agreement     Kendell Bane AGNP-C Internal medicine

## 2019-01-20 NOTE — Patient Instructions (Signed)
Diabetes Mellitus and Nutrition, Adult  When you have diabetes (diabetes mellitus), it is very important to have healthy eating habits because your blood sugar (glucose) levels are greatly affected by what you eat and drink. Eating healthy foods in the appropriate amounts, at about the same times every day, can help you:  · Control your blood glucose.  · Lower your risk of heart disease.  · Improve your blood pressure.  · Reach or maintain a healthy weight.  Every person with diabetes is different, and each person has different needs for a meal plan. Your health care provider may recommend that you work with a diet and nutrition specialist (dietitian) to make a meal plan that is best for you. Your meal plan may vary depending on factors such as:  · The calories you need.  · The medicines you take.  · Your weight.  · Your blood glucose, blood pressure, and cholesterol levels.  · Your activity level.  · Other health conditions you have, such as heart or kidney disease.  How do carbohydrates affect me?  Carbohydrates, also called carbs, affect your blood glucose level more than any other type of food. Eating carbs naturally raises the amount of glucose in your blood. Carb counting is a method for keeping track of how many carbs you eat. Counting carbs is important to keep your blood glucose at a healthy level, especially if you use insulin or take certain oral diabetes medicines.  It is important to know how many carbs you can safely have in each meal. This is different for every person. Your dietitian can help you calculate how many carbs you should have at each meal and for each snack.  Foods that contain carbs include:  · Bread, cereal, rice, pasta, and crackers.  · Potatoes and corn.  · Peas, beans, and lentils.  · Milk and yogurt.  · Fruit and juice.  · Desserts, such as cakes, cookies, ice cream, and candy.  How does alcohol affect me?  Alcohol can cause a sudden decrease in blood glucose (hypoglycemia),  especially if you use insulin or take certain oral diabetes medicines. Hypoglycemia can be a life-threatening condition. Symptoms of hypoglycemia (sleepiness, dizziness, and confusion) are similar to symptoms of having too much alcohol.  If your health care provider says that alcohol is safe for you, follow these guidelines:  · Limit alcohol intake to no more than 1 drink per day for nonpregnant women and 2 drinks per day for men. One drink equals 12 oz of beer, 5 oz of wine, or 1½ oz of hard liquor.  · Do not drink on an empty stomach.  · Keep yourself hydrated with water, diet soda, or unsweetened iced tea.  · Keep in mind that regular soda, juice, and other mixers may contain a lot of sugar and must be counted as carbs.  What are tips for following this plan?    Reading food labels  · Start by checking the serving size on the "Nutrition Facts" label of packaged foods and drinks. The amount of calories, carbs, fats, and other nutrients listed on the label is based on one serving of the item. Many items contain more than one serving per package.  · Check the total grams (g) of carbs in one serving. You can calculate the number of servings of carbs in one serving by dividing the total carbs by 15. For example, if a food has 30 g of total carbs, it would be equal to 2   servings of carbs.  · Check the number of grams (g) of saturated and trans fats in one serving. Choose foods that have low or no amount of these fats.  · Check the number of milligrams (mg) of salt (sodium) in one serving. Most people should limit total sodium intake to less than 2,300 mg per day.  · Always check the nutrition information of foods labeled as "low-fat" or "nonfat". These foods may be higher in added sugar or refined carbs and should be avoided.  · Talk to your dietitian to identify your daily goals for nutrients listed on the label.  Shopping  · Avoid buying canned, premade, or processed foods. These foods tend to be high in fat, sodium,  and added sugar.  · Shop around the outside edge of the grocery store. This includes fresh fruits and vegetables, bulk grains, fresh meats, and fresh dairy.  Cooking  · Use low-heat cooking methods, such as baking, instead of high-heat cooking methods like deep frying.  · Cook using healthy oils, such as olive, canola, or sunflower oil.  · Avoid cooking with butter, cream, or high-fat meats.  Meal planning  · Eat meals and snacks regularly, preferably at the same times every day. Avoid going long periods of time without eating.  · Eat foods high in fiber, such as fresh fruits, vegetables, beans, and whole grains. Talk to your dietitian about how many servings of carbs you can eat at each meal.  · Eat 4-6 ounces (oz) of lean protein each day, such as lean meat, chicken, fish, eggs, or tofu. One oz of lean protein is equal to:  ? 1 oz of meat, chicken, or fish.  ? 1 egg.  ? ¼ cup of tofu.  · Eat some foods each day that contain healthy fats, such as avocado, nuts, seeds, and fish.  Lifestyle  · Check your blood glucose regularly.  · Exercise regularly as told by your health care provider. This may include:  ? 150 minutes of moderate-intensity or vigorous-intensity exercise each week. This could be brisk walking, biking, or water aerobics.  ? Stretching and doing strength exercises, such as yoga or weightlifting, at least 2 times a week.  · Take medicines as told by your health care provider.  · Do not use any products that contain nicotine or tobacco, such as cigarettes and e-cigarettes. If you need help quitting, ask your health care provider.  · Work with a counselor or diabetes educator to identify strategies to manage stress and any emotional and social challenges.  Questions to ask a health care provider  · Do I need to meet with a diabetes educator?  · Do I need to meet with a dietitian?  · What number can I call if I have questions?  · When are the best times to check my blood glucose?  Where to find more  information:  · American Diabetes Association: diabetes.org  · Academy of Nutrition and Dietetics: www.eatright.org  · National Institute of Diabetes and Digestive and Kidney Diseases (NIH): www.niddk.nih.gov  Summary  · A healthy meal plan will help you control your blood glucose and maintain a healthy lifestyle.  · Working with a diet and nutrition specialist (dietitian) can help you make a meal plan that is best for you.  · Keep in mind that carbohydrates (carbs) and alcohol have immediate effects on your blood glucose levels. It is important to count carbs and to use alcohol carefully.  This information is not intended to   replace advice given to you by your health care provider. Make sure you discuss any questions you have with your health care provider.  Document Released: 07/30/2005 Document Revised: 06/02/2017 Document Reviewed: 12/07/2016  Elsevier Interactive Patient Education © 2019 [employer]

## 2019-01-26 ENCOUNTER — Other Ambulatory Visit: Payer: Self-pay | Admitting: Adult Health

## 2019-01-27 ENCOUNTER — Other Ambulatory Visit: Payer: Self-pay

## 2019-01-27 ENCOUNTER — Ambulatory Visit (INDEPENDENT_AMBULATORY_CARE_PROVIDER_SITE_OTHER): Payer: Medicare Other

## 2019-01-27 ENCOUNTER — Ambulatory Visit: Payer: Medicare Other

## 2019-01-27 DIAGNOSIS — E1121 Type 2 diabetes mellitus with diabetic nephropathy: Secondary | ICD-10-CM

## 2019-01-27 DIAGNOSIS — E1122 Type 2 diabetes mellitus with diabetic chronic kidney disease: Secondary | ICD-10-CM

## 2019-01-27 DIAGNOSIS — N186 End stage renal disease: Principal | ICD-10-CM

## 2019-01-27 LAB — TSH: TSH: 4.73 u[IU]/mL — ABNORMAL HIGH (ref 0.450–4.500)

## 2019-01-27 LAB — COMPREHENSIVE METABOLIC PANEL
A/G RATIO: 1.7 (ref 1.2–2.2)
ALT: 56 IU/L — AB (ref 0–32)
AST: 47 IU/L — ABNORMAL HIGH (ref 0–40)
Albumin: 4.2 g/dL (ref 3.8–4.8)
Alkaline Phosphatase: 159 IU/L — ABNORMAL HIGH (ref 39–117)
BUN/Creatinine Ratio: 14 (ref 12–28)
BUN: 16 mg/dL (ref 8–27)
Bilirubin Total: 0.4 mg/dL (ref 0.0–1.2)
CO2: 16 mmol/L — ABNORMAL LOW (ref 20–29)
Calcium: 9.1 mg/dL (ref 8.7–10.3)
Chloride: 106 mmol/L (ref 96–106)
Creatinine, Ser: 1.17 mg/dL — ABNORMAL HIGH (ref 0.57–1.00)
GFR calc Af Amer: 55 mL/min/{1.73_m2} — ABNORMAL LOW (ref >59)
GFR calc non Af Amer: 48 mL/min/{1.73_m2} — ABNORMAL LOW (ref >59)
Globulin, Total: 2.5 g/dL (ref 1.5–4.5)
Glucose: 325 mg/dL — ABNORMAL HIGH (ref 65–99)
Potassium: 4.4 mmol/L (ref 3.5–5.2)
Sodium: 140 mmol/L (ref 134–144)
Total Protein: 6.7 g/dL (ref 6.0–8.5)

## 2019-01-27 LAB — T4, FREE: FREE T4: 0.94 ng/dL (ref 0.82–1.77)

## 2019-02-14 ENCOUNTER — Ambulatory Visit: Payer: Medicare Other | Admitting: Internal Medicine

## 2019-02-27 ENCOUNTER — Encounter: Payer: Self-pay | Admitting: Adult Health

## 2019-02-27 ENCOUNTER — Other Ambulatory Visit: Payer: Self-pay

## 2019-02-27 ENCOUNTER — Ambulatory Visit (INDEPENDENT_AMBULATORY_CARE_PROVIDER_SITE_OTHER): Payer: Medicare Other | Admitting: Internal Medicine

## 2019-02-27 DIAGNOSIS — E1165 Type 2 diabetes mellitus with hyperglycemia: Secondary | ICD-10-CM

## 2019-02-27 DIAGNOSIS — G4730 Sleep apnea, unspecified: Secondary | ICD-10-CM

## 2019-02-27 DIAGNOSIS — E039 Hypothyroidism, unspecified: Secondary | ICD-10-CM | POA: Diagnosis not present

## 2019-02-27 DIAGNOSIS — I10 Essential (primary) hypertension: Secondary | ICD-10-CM

## 2019-02-27 LAB — POCT GLYCOSYLATED HEMOGLOBIN (HGB A1C): Hemoglobin A1C: 8.7 % — AB (ref 4.0–5.6)

## 2019-02-27 MED ORDER — LOSARTAN POTASSIUM 50 MG PO TABS: 50.0000 mg | ORAL_TABLET | Freq: Every day | ORAL | 3 refills | Status: DC

## 2019-02-27 MED ORDER — INSULIN LISPRO (1 UNIT DIAL) 100 UNIT/ML (KWIKPEN): PEN_INJECTOR | SUBCUTANEOUS | 3 refills | Status: DC

## 2019-02-27 MED ORDER — LEVOTHYROXINE SODIUM 50 MCG PO TABS: ORAL_TABLET | ORAL | 3 refills | Status: DC

## 2019-02-27 NOTE — Progress Notes (Signed)
[facility] [place], [place] [address]  Internal MEDICINE  Office Visit Note  Patient Name: Chelsey [name]  [id number]  [id number]  Date of Service: 02/27/2019  Chief Complaint  Patient presents with  . Diabetes    follow up medication changes ,labs,  bs has been running high in mornings per patient   . Hypertension    HPI Pt is here for PCP care. Sugar has been elevated, her metformin and lisinopril was stopped due to side effects. Her renal functions are better  BP today seems to be under good control. She is om Hydralazine and amlodopin I am not sure about her compliance   Current Medication: Outpatient Encounter Medications as of 02/27/2019  Medication Sig Note  . albuterol (PROAIR HFA) 108 (90 Base) MCG/ACT inhaler Inhale 2 puffs into the lungs every 6 (six) hours as needed for wheezing or shortness of breath.   . allopurinol (ZYLOPRIM) 300 MG tablet Take 300 mg by mouth 2 (two) times daily.    Marland Kitchen amLODipine (NORVASC) 10 MG tablet TAKE 1 TABLET BY MOUTH DAILY   . aspirin EC 81 MG tablet Take 81 mg by mouth daily.   . Blood Glucose Monitoring Suppl (ACCU-CHEK AVIVA PLUS) W/DEVICE KIT Use as directed. 12/20/2013: Received from: External Pharmacy  . COLCRYS 0.6 MG tablet Take 0.6 mg by mouth 2 (two) times daily as needed (for gout flares.).  09/20/2013: Received from: External Pharmacy  . Dulaglutide (TRULICITY) 1.5 QA/4.4LP SOPN Inject 1.5 mg into the skin once a week.   . ferrous sulfate 325 (65 FE) MG tablet Take 325 mg by mouth daily with breakfast.   . Fluticasone-Salmeterol (ADVAIR) 250-50 MCG/DOSE AEPB Inhale 1 puff into the lungs 2 (two) times daily.   Marland Kitchen gabapentin (NEURONTIN) 300 MG capsule Take 300 mg by mouth 2 (two) times daily.    Marland Kitchen glucose blood test strip 1 each daily. Use 1 test strip daily. 09/20/2013: Received from: External Pharmacy  . hydrALAZINE (APRESOLINE) 10 MG tablet Take 1 tablet (10 mg total) by mouth 3 (three) times daily.   .  hydroxychloroquine (PLAQUENIL) 200 MG tablet TAKE 1 TABLET BY MOUTH TWICE A DAY   . insulin lispro (HUMALOG KWIKPEN) 100 UNIT/ML KwikPen 10 units with each meal if sugar is high by sliding scale  Maximum 60 units   . Insulin Pen Needle (FIFTY50 PEN NEEDLES) 31G X 8 MM MISC USE THREE TIMES A DAY   . LANCETS ULTRA THIN MISC Apply 1 each topically daily.  09/20/2013: Received from: External Pharmacy  . LANTUS SOLOSTAR 100 UNIT/ML Solostar Pen Inject 65 Units into the skin daily.   Marland Kitchen omeprazole (PRILOSEC) 20 MG capsule Take 20 mg by mouth 2 (two) times daily before a meal.  09/20/2013: Received from: External Pharmacy  . OXYGEN Inhale 3 L into the lungs. Nighttime use   . polyethylene glycol (MIRALAX / GLYCOLAX) packet Take 17 g by mouth daily.   . potassium chloride SA (K-DUR,KLOR-CON) 20 MEQ tablet TAKE 1 TABLET BY MOUTH DAILY   . simvastatin (ZOCOR) 20 MG tablet Take 20 mg by mouth daily with lunch.  12/20/2013: Received from: External Pharmacy  . tretinoin (RETIN-A) 0.025 % cream Apply topically.   . triamcinolone cream (KENALOG) 0.1 % Apply twice a day to affected areas of dry itchy skin until clear   . [DISCONTINUED] glimepiride (AMARYL) 2 MG tablet Take 1 tablet (2 mg total) by mouth daily before breakfast.   . [DISCONTINUED] insulin lispro (HUMALOG  KWIKPEN) 100 UNIT/ML KwikPen 10 units with meal if sugar is high by sliding scale (Patient taking differently: 10 units with each meal if sugar is high by sliding scale  Maximum 60 units)   . fluticasone (FLONASE) 50 MCG/ACT nasal spray Frequency:QD   Dosage:50   MCG  Instructions:  Note:2 sprays each nostril daily Dose: 50MCG 08/21/2016: Received from: [facility]  . [DISCONTINUED] empagliflozin (JARDIANCE) 10 MG TABS tablet Take by mouth.   . [DISCONTINUED] furosemide (LASIX) 40 MG tablet TAKE 1 TABLET BY MOUTH EVERY DAY   . [DISCONTINUED] lisinopril (PRINIVIL,ZESTRIL) 40 MG tablet Take 40 mg by mouth daily.    . [DISCONTINUED] metFORMIN  (GLUCOPHAGE) 1000 MG tablet Take 1,000 mg by mouth 2 (two) times daily with a meal.    Facility-Administered Encounter Medications as of 02/27/2019  Medication  . betamethasone acetate-betamethasone sodium phosphate (CELESTONE) injection 3 mg    Surgical History: Past Surgical History:  Procedure Laterality Date  . BRAIN SURGERY  1990's  . CATARACT EXTRACTION W/PHACO Left 06/11/2016   Procedure: CATARACT EXTRACTION PHACO AND INTRAOCULAR LENS PLACEMENT (IOC);  Surgeon: Birder Robson, MD;  Location: [facility] ORS;  Service: Ophthalmology;  Laterality: Left;  [place] 1.01 AP% 21.6 CDE 13.23 Fluid pack lot # [id number]  . CEREBRAL ANEURYSM REPAIR  1990's   COILS  . CORONARY ARTERY BYPASS GRAFT    . EYE SURGERY  2000's   bilateral cataract  . FRACTURE SURGERY    . HERNIA REPAIR  2000   ventral  . STENT PLACEMENT VASCULAR ([place] HX)    . VEIN BYPASS SURGERY    . VENTRAL HERNIA REPAIR N/A 04/29/2016   Procedure: Laparoscopic HERNIA REPAIR VENTRAL ADULT;  Surgeon: Jules Husbands, MD;  Location: [facility] ORS;  Service: General;  Laterality: N/A;    Medical History: Past Medical History:  Diagnosis Date  . Anemia   . Aneurysm ([place])   . Arthritis    RHEUMATOID ARTHRITIS  . Asthma   . Collagen vascular disease ([place])   . COPD (chronic obstructive pulmonary disease) ([place])   . Coronary artery disease   . Diabetes mellitus without complication ([place])   . Edema    FEET/LEGS  . GERD (gastroesophageal reflux disease)   . Gout   . H/O wheezing   . History of hiatal hernia   . Hypertension   . Hypothyroidism   . Neuropathy   . Oxygen deficiency    HS  . Peripheral vascular disease ([place])   . Sarcoidosis of lung ([place])   . Seizures ([name])    WITH BRAIN 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   . Sleep apnea    OXYGEN AT NIGHT 3 L [address]    Family History: Family History  Problem Relation Age of Onset  . Heart disease Mother   . Hypertension Mother   . Diabetes Mother   . Diabetes Father   . Heart disease  Father   . Hypertension Father     Social History   Socioeconomic History  . Marital status: Single    Spouse name: Not on file  . Number of children: Not on file  . Years of education: Not on file  . Highest education level: Not on file  Occupational History  . Not on file  Social Needs  . Financial resource strain: Not on file  . Food insecurity:    Worry: Not on file    Inability: Not on file  . Transportation needs:    Medical: Not on file  Non-medical: Not on file  Tobacco Use  . Smoking status: Former Smoker    Packs/day: 1.00    Years: 30.00    Pack years: 30.00    Types: Cigarettes    Last attempt to quit: 01/27/2006    Years since quitting: 13.0  . Smokeless tobacco: Never Used  . Tobacco comment: quit   Substance and Sexual Activity  . Alcohol use: No    Alcohol/week: 0.0 standard drinks  . Drug use: No  . Sexual activity: Not on file  Lifestyle  . Physical activity:    Days per week: Not on file    Minutes per session: Not on file  . Stress: Not on file  Relationships  . Social connections:    Talks on phone: Not on file    Gets together: Not on file    Attends religious service: Not on file    Active member of club or organization: Not on file    Attends meetings of clubs or organizations: Not on file    Relationship status: Not on file  . Intimate partner violence:    Fear of current or ex partner: Not on file    Emotionally abused: Not on file    Physically abused: Not on file    Forced sexual activity: Not on file  Other Topics Concern  . Not on file  Social History Narrative  . Not on file   Review of Systems  Constitutional: Negative for chills, diaphoresis and fatigue.  HENT: Negative for ear pain, postnasal drip and sinus pressure.   Eyes: Negative for photophobia, discharge, redness, itching and visual disturbance.  Respiratory: Negative for cough, shortness of breath and wheezing.   Cardiovascular: Negative for chest pain,  palpitations and leg swelling.  Gastrointestinal: Negative for abdominal pain, constipation, diarrhea, nausea and vomiting.  Genitourinary: Negative for dysuria and flank pain.  Musculoskeletal: Negative for arthralgias, back pain, gait problem and neck pain.  Skin: Negative for color change.  Allergic/Immunologic: Negative for environmental allergies and food allergies.  Neurological: Negative for dizziness and headaches.  Hematological: Does not bruise/bleed easily.  Psychiatric/Behavioral: Negative for agitation, behavioral problems (depression) and hallucinations.    Vital Signs: BP 132/80   Pulse 75   Resp 16   Ht 5' 5"  (1.651 m)   Wt 215 lb (97.5 kg)   SpO2 98%   BMI 35.78 kg/m    Physical Exam Constitutional:      General: She is not in acute distress.    Appearance: She is well-developed. She is not diaphoretic.  HENT:     Head: Normocephalic and atraumatic.     Mouth/Throat:     Pharynx: No oropharyngeal exudate.  Eyes:     Pupils: Pupils are equal, round, and reactive to light.  Neck:     Musculoskeletal: Normal range of motion and neck supple.     Thyroid: No thyromegaly.     Vascular: No JVD.     Trachea: No tracheal deviation.  Cardiovascular:     Rate and Rhythm: Normal rate and regular rhythm.     Heart sounds: Normal heart sounds. No murmur. No friction rub. No gallop.   Pulmonary:     Effort: Pulmonary effort is normal. No respiratory distress.     Breath sounds: No wheezing or rales.  Chest:     Chest wall: No tenderness.  Abdominal:     General: Bowel sounds are normal.     Palpations: Abdomen is soft.  Musculoskeletal: Normal range  of motion.  Lymphadenopathy:     Cervical: No cervical adenopathy.  Skin:    General: Skin is warm and dry.  Neurological:     Mental Status: She is alert and oriented to person, place, and time.     Cranial Nerves: No cranial nerve deficit.  Psychiatric:        Behavior: Behavior normal.        Thought Content:  Thought content normal.        Judgment: Judgment normal.    Assessment/Plan: 1. Uncontrolled type 2 diabetes mellitus with hyperglycemia (HCC) - Continue Lantus 65 units in am, Add Farxiga 5 mg po qd samples given, dc glimepiride for now, continue Insulin kwikpen 1o units before each meal and SS as indicated. Continue Trulicity once a week  - POCT HgB A1C ( 8.7) unchanged from 2 months ago  2. Essential hypertension, benign - DC Lasix and potassium, continue amlodopine and Hydralazine, add losartan 50 mg po qd   3. Hypothyroidism, unspecified type - Start Synthroid 50 mcg po qam   4. Sleep apnea in adult - Home sleep test. Next visit   General Counseling: Maelee verbalizes understanding of the findings of todays visit and agrees with plan of treatment. I have discussed any further diagnostic evaluation that may be needed or ordered today. We also reviewed her medications today. she has been encouraged to call the office with any questions or concerns that should arise related to todays visit.       Goals   None     This is a list of the screening recommended for you and due dates:  Health Maintenance  Topic Date Due  .  Hepatitis C: One time screening is recommended by Center for Disease Control  (CDC) for  adults born from [birth year] through 1966.   02/20/1951  . Colon Cancer Screening  05/17/2019*  . Hemoglobin A1C  06/06/2019  . Flu Shot  06/17/2019  . Complete foot exam   10/26/2019  . Eye exam for diabetics  12/31/2019  . Mammogram  08/26/2020  . Tetanus Vaccine  01/07/2022  . DEXA scan (bone density measurement)  Completed  . Pneumonia vaccines  Completed  *Topic was postponed. The date shown is not the original due date.      Orders Placed This Encounter  Procedures  . POCT HgB A1C    Meds ordered this encounter  Medications  . insulin lispro (HUMALOG KWIKPEN) 100 UNIT/ML KwikPen    Sig: 10 units with each meal if sugar is high by sliding scale  Maximum 60 units     Dispense:  45 mL    Refill:  3    Time spent:25 Minutes      Dr Lavera Guise Internal medicine

## 2019-03-02 ENCOUNTER — Telehealth: Payer: Self-pay | Admitting: Internal Medicine

## 2019-03-02 NOTE — Telephone Encounter (Signed)
Reached out to medical [employer] patient is taking medications as she should , refills of medications has been picked up when due.

## 2019-03-06 ENCOUNTER — Other Ambulatory Visit: Payer: Self-pay

## 2019-03-06 ENCOUNTER — Ambulatory Visit (INDEPENDENT_AMBULATORY_CARE_PROVIDER_SITE_OTHER): Payer: Medicare Other | Admitting: Podiatry

## 2019-03-06 ENCOUNTER — Encounter: Payer: Self-pay | Admitting: Podiatry

## 2019-03-06 ENCOUNTER — Ambulatory Visit: Payer: Medicare Other | Admitting: Podiatry

## 2019-03-06 VITALS — Temp 98.0°F

## 2019-03-06 DIAGNOSIS — B351 Tinea unguium: Secondary | ICD-10-CM

## 2019-03-06 DIAGNOSIS — M79676 Pain in unspecified toe(s): Secondary | ICD-10-CM

## 2019-03-06 DIAGNOSIS — E1159 Type 2 diabetes mellitus with other circulatory complications: Secondary | ICD-10-CM

## 2019-03-06 NOTE — Progress Notes (Signed)
Complaint:  Visit Type: Patient returns to my office for continued preventative foot care services. Complaint: Patient states" my nails have grown long and thick and become painful to walk and wear shoes" Patient has been diagnosed with DM with  angiopathy.  The patient presents for preventative foot care services. No changes to ROS.  Patient says her callus on her big toes are usually treated.  Podiatric Exam: Vascular: dorsalis pedis and posterior tibial pulses are barely palpable bilateral. Capillary return is immediate. Temperature gradient is WNL. Skin turgor WNL  Sensorium: Normal Semmes Weinstein monofilament test. Normal tactile sensation bilaterally. Nail Exam: Pt has thick disfigured discolored nails with subungual debris noted bilateral entire nail hallux through fifth toenails Ulcer Exam: There is no evidence of ulcer or pre-ulcerative changes or infection. Orthopedic Exam: Muscle tone and strength are WNL. No limitations in general ROM. No crepitus or effusions noted. Pes planus with HAV  B/L  .DJD STJ left foot. Skin:  Porokeratosis asymptomatic   Sub 1  B/L. No infection or ulcers.  Pinch callus  B/l  Diagnosis:  Onychomycosis, , Pain in right toe, pain in left toes,    Treatment & Plan Procedures and Treatment: Consent by patient was obtained for treatment procedures. The patient understood the discussion of treatment and procedures well. All questions were answered thoroughly reviewed. Debridement of mycotic and hypertrophic toenails, 1 through 5 bilateral and clearing of subungual debris. No ulceration, no infection noted. Return Visit-Office Procedure: Patient instructed to return to the office for a follow up visit 3 months for continued evaluation and treatment.    Gardiner Barefoot DPM

## 2019-03-14 ENCOUNTER — Ambulatory Visit (INDEPENDENT_AMBULATORY_CARE_PROVIDER_SITE_OTHER): Payer: Medicare Other | Admitting: Adult Health

## 2019-03-14 ENCOUNTER — Encounter: Payer: Self-pay | Admitting: Adult Health

## 2019-03-14 ENCOUNTER — Other Ambulatory Visit: Payer: Self-pay

## 2019-03-14 VITALS — Ht 64.0 in | Wt 215.0 lb

## 2019-03-14 DIAGNOSIS — E1165 Type 2 diabetes mellitus with hyperglycemia: Secondary | ICD-10-CM

## 2019-03-14 DIAGNOSIS — G4730 Sleep apnea, unspecified: Secondary | ICD-10-CM

## 2019-03-14 DIAGNOSIS — I10 Essential (primary) hypertension: Secondary | ICD-10-CM

## 2019-03-14 DIAGNOSIS — E039 Hypothyroidism, unspecified: Secondary | ICD-10-CM | POA: Diagnosis not present

## 2019-03-14 MED ORDER — LANTUS SOLOSTAR 100 UNIT/ML ~~LOC~~ SOPN: 75.0000 [IU] | PEN_INJECTOR | Freq: Every day | SUBCUTANEOUS | 3 refills | Status: DC

## 2019-03-14 NOTE — Progress Notes (Signed)
[facility] [place], [place] [address]  Internal MEDICINE  Telephone Visit  Patient Name: Chelsey [name]  [id number]  [id number]  Date of Service: 03/14/2019  I connected with the patient at 855 by telephone and verified the patients identity using two identifiers.  I discussed the limitations, risks, security and privacy concerns of performing an evaluation and management service by telephone and the availability of in person appointments. I also discussed with the patient that there may be a patient responsible charge related to the service.  The patient expressed understanding and agrees to proceed.    Chief Complaint  Patient presents with  . Telephone Screen  . Diabetes    BLOOD SUGARS RUNNING HIGH 264 THIS MORNING , PATIENT STATES THAT SHE NEEDS TO CHANGE MEDS BECAUSE IT STILL RUNNING HIGH EVERY MORNING   . Hypertension  . Osteoarthritis    NEEDS SOMETHING FOR ARTHRITIS , FRONT OF THE LEGS HAVE BEEN HURTING AND UNABLE TO GET TO [facility] FOR THIS   . Telephone Assessment    HPI Pt on telephone visit for diabetes.  She reports her blood sugar continues to be around 250-'300mg'$ /dl daily.  She is taking 10 units of humalog at each meal, as well as lantus 65 units daily, trulicity 1.5 weekly, and farxiga '5mg'$  daily. Denies Chest pain, Shortness of breath, palpitations, headache, or blurred vision.       Current Medication: Outpatient Encounter Medications as of 03/14/2019  Medication Sig Note  . albuterol (PROAIR HFA) 108 (90 Base) MCG/ACT inhaler Inhale 2 puffs into the lungs every 6 (six) hours as needed for wheezing or shortness of breath.   . allopurinol (ZYLOPRIM) 300 MG tablet Take 300 mg by mouth daily.    Marland Kitchen amLODipine (NORVASC) 10 MG tablet TAKE 1 TABLET BY MOUTH DAILY   . aspirin EC 81 MG tablet Take 81 mg by mouth daily.   . Blood Glucose Monitoring Suppl (ACCU-CHEK AVIVA PLUS) W/DEVICE KIT Use as directed. 12/20/2013: Received from: External Pharmacy  .  COLCRYS 0.6 MG tablet Take 0.6 mg by mouth 2 (two) times daily as needed (for gout flares.).  09/20/2013: Received from: External Pharmacy  . dapagliflozin propanediol (FARXIGA) 5 MG TABS tablet Take 5 mg by mouth daily.   . Dulaglutide (TRULICITY) 1.5 UX/3.2GM SOPN Inject 1.5 mg into the skin once a week.   . ferrous sulfate 325 (65 FE) MG tablet Take 325 mg by mouth daily with breakfast.   . fluticasone (FLONASE) 50 MCG/ACT nasal spray Frequency:QD   Dosage:50   MCG  Instructions:  Note:2 sprays each nostril daily Dose: 50MCG 08/21/2016: Received from: [facility]  . Fluticasone-Salmeterol (ADVAIR) 250-50 MCG/DOSE AEPB Inhale 1 puff into the lungs 2 (two) times daily.   Marland Kitchen gabapentin (NEURONTIN) 300 MG capsule Take 300 mg by mouth 3 (three) times daily.   Marland Kitchen glucose blood test strip 1 each daily. Use 1 test strip daily. 09/20/2013: Received from: External Pharmacy  . hydrALAZINE (APRESOLINE) 10 MG tablet Take 1 tablet (10 mg total) by mouth 3 (three) times daily.   . hydroxychloroquine (PLAQUENIL) 200 MG tablet TAKE 1 TABLET BY MOUTH TWICE A DAY   . insulin lispro (HUMALOG KWIKPEN) 100 UNIT/ML KwikPen 10 units with each meal if sugar is high by sliding scale  Maximum 65 units   . Insulin Pen Needle (FIFTY50 PEN NEEDLES) 31G X 8 MM MISC USE THREE TIMES A DAY   . LANCETS ULTRA THIN MISC Apply 1 each topically  daily.  09/20/2013: Received from: External Pharmacy  . LANTUS SOLOSTAR 100 UNIT/ML Solostar Pen Inject 65 Units into the skin daily.   Marland Kitchen levothyroxine (SYNTHROID, LEVOTHROID) 50 MCG tablet Take one tab in am on empty stomach for underactive thyroid   . losartan (COZAAR) 50 MG tablet Take 1 tablet (50 mg total) by mouth daily. For htn   . omeprazole (PRILOSEC) 20 MG capsule Take 20 mg by mouth 2 (two) times daily before a meal.  09/20/2013: Received from: External Pharmacy  . OXYGEN Inhale 3 L into the lungs. Nighttime use   . polyethylene glycol (MIRALAX / GLYCOLAX) packet Take 17 g by mouth  daily.   . simvastatin (ZOCOR) 20 MG tablet Take 20 mg by mouth daily with lunch.  12/20/2013: Received from: External Pharmacy  . tretinoin (RETIN-A) 0.025 % cream Apply topically.   . triamcinolone cream (KENALOG) 0.1 % Apply twice a day to affected areas of dry itchy skin until clear   . [DISCONTINUED] gabapentin (NEURONTIN) 300 MG capsule Take 300 mg by mouth 2 (two) times daily.     Facility-Administered Encounter Medications as of 03/14/2019  Medication  . betamethasone acetate-betamethasone sodium phosphate (CELESTONE) injection 3 mg    Surgical History: Past Surgical History:  Procedure Laterality Date  . BRAIN SURGERY  1990's  . CATARACT EXTRACTION W/PHACO Left 06/11/2016   Procedure: CATARACT EXTRACTION PHACO AND INTRAOCULAR LENS PLACEMENT (IOC);  Surgeon: Birder Robson, MD;  Location: [facility] ORS;  Service: Ophthalmology;  Laterality: Left;  [place] 1.01 AP% 21.6 CDE 13.23 Fluid pack lot # [id number]  . CEREBRAL ANEURYSM REPAIR  1990's   COILS  . CORONARY ARTERY BYPASS GRAFT    . EYE SURGERY  2000's   bilateral cataract  . FRACTURE SURGERY    . HERNIA REPAIR  2000   ventral  . STENT PLACEMENT VASCULAR ([place] HX)    . VEIN BYPASS SURGERY    . VENTRAL HERNIA REPAIR N/A 04/29/2016   Procedure: Laparoscopic HERNIA REPAIR VENTRAL ADULT;  Surgeon: Jules Husbands, MD;  Location: [facility] ORS;  Service: General;  Laterality: N/A;    Medical History: Past Medical History:  Diagnosis Date  . Anemia   . Aneurysm ([place])   . Arthritis    RHEUMATOID ARTHRITIS  . Asthma   . Collagen vascular disease ([place])   . COPD (chronic obstructive pulmonary disease) ([place])   . Coronary artery disease   . Diabetes mellitus without complication ([place])   . Edema    FEET/LEGS  . GERD (gastroesophageal reflux disease)   . Gout   . H/O wheezing   . History of hiatal hernia   . Hypertension   . Hypothyroidism   . Neuropathy   . Oxygen deficiency    HS  . Peripheral vascular disease ([place])   . Sarcoidosis  of lung ([place])   . Seizures ([place])    WITH BRAIN 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   . Sleep apnea    OXYGEN AT NIGHT 3 L [place]    Family History: Family History  Problem Relation Age of Onset  . Heart disease Mother   . Hypertension Mother   . Diabetes Mother   . Diabetes Father   . Heart disease Father   . Hypertension Father     Social History   Socioeconomic History  . Marital status: Single    Spouse name: Not on file  . Number of children: Not on file  . Years of education: Not on file  . Highest education level:  Not on file  Occupational History  . Not on file  Social Needs  . Financial resource strain: Not on file  . Food insecurity:    Worry: Not on file    Inability: Not on file  . Transportation needs:    Medical: Not on file    Non-medical: Not on file  Tobacco Use  . Smoking status: Former Smoker    Packs/day: 1.00    Years: 30.00    Pack years: 30.00    Types: Cigarettes    Last attempt to quit: 01/27/2006    Years since quitting: 13.1  . Smokeless tobacco: Never Used  . Tobacco comment: quit   Substance and Sexual Activity  . Alcohol use: No    Alcohol/week: 0.0 standard drinks  . Drug use: No  . Sexual activity: Not on file  Lifestyle  . Physical activity:    Days per week: Not on file    Minutes per session: Not on file  . Stress: Not on file  Relationships  . Social connections:    Talks on phone: Not on file    Gets together: Not on file    Attends religious service: Not on file    Active member of club or organization: Not on file    Attends meetings of clubs or organizations: Not on file    Relationship status: Not on file  . Intimate partner violence:    Fear of current or ex partner: Not on file    Emotionally abused: Not on file    Physically abused: Not on file    Forced sexual activity: Not on file  Other Topics Concern  . Not on file  Social History Narrative  . Not on file      Review of Systems  Constitutional: Negative for  chills, fatigue and unexpected weight change.  HENT: Negative for congestion, rhinorrhea, sneezing and sore throat.   Eyes: Negative for photophobia, pain and redness.  Respiratory: Negative for cough, chest tightness and shortness of breath.   Cardiovascular: Negative for chest pain and palpitations.  Gastrointestinal: Negative for abdominal pain, constipation, diarrhea, nausea and vomiting.  Endocrine: Negative.   Genitourinary: Negative for dysuria and frequency.  Musculoskeletal: Negative for arthralgias, back pain, joint swelling and neck pain.  Skin: Negative for rash.  Allergic/Immunologic: Negative.   Neurological: Negative for tremors and numbness.  Hematological: Negative for adenopathy. Does not bruise/bleed easily.  Psychiatric/Behavioral: Negative for behavioral problems and sleep disturbance. The patient is not nervous/anxious.     Vital Signs: Ht '5\' 4"'$  (1.626 m)   Wt 215 lb (97.5 kg)   BMI 36.90 kg/m    Observation/Objective:  Clear speech, pleasant sounding, speaking in full sentences.     Assessment/Plan: 1. Uncontrolled type 2 diabetes mellitus with hyperglycemia (HCC) Increase Humalog to 12 units at each meal for a day, and then go up to 15 units at each meal depending on sugar reading.   Increase Farxiga to '10mg'$  daily, samples given Continue lantus and trulicity.  Follow up in office in 3 weeks with sugar log. - LANTUS SOLOSTAR 100 UNIT/ML Solostar Pen; Inject 75 Units into the skin daily.  Dispense: 15 mL; Refill: 3  2. Essential hypertension, benign Stable, continue present management.  3. Hypothyroidism, unspecified type Pt reports she is taking synthroid as prescribed.  Will follow up TSH and FT4 in 6-8 weeks.   4. Sleep apnea in adult Will discuss obtaining sleep study at next visit in 3 weeks.  General  Counseling: Kenyatta verbalizes understanding of the findings of today's phone visit and agrees with plan of treatment. I have discussed any further  diagnostic evaluation that may be needed or ordered today. We also reviewed her medications today. she has been encouraged to call the office with any questions or concerns that should arise related to todays visit.    No orders of the defined types were placed in this encounter.   No orders of the defined types were placed in this encounter.   Time spent:11 Minutes    Orson Gear AGNP-C Internal medicine

## 2019-03-14 NOTE — Patient Instructions (Signed)
Diabetes Mellitus and Nutrition, Adult  When you have diabetes (diabetes mellitus), it is very important to have healthy eating habits because your blood sugar (glucose) levels are greatly affected by what you eat and drink. Eating healthy foods in the appropriate amounts, at about the same times every day, can help you:  · Control your blood glucose.  · Lower your risk of heart disease.  · Improve your blood pressure.  · Reach or maintain a healthy weight.  Every person with diabetes is different, and each person has different needs for a meal plan. Your health care provider may recommend that you work with a diet and nutrition specialist (dietitian) to make a meal plan that is best for you. Your meal plan may vary depending on factors such as:  · The calories you need.  · The medicines you take.  · Your weight.  · Your blood glucose, blood pressure, and cholesterol levels.  · Your activity level.  · Other health conditions you have, such as heart or kidney disease.  How do carbohydrates affect me?  Carbohydrates, also called carbs, affect your blood glucose level more than any other type of food. Eating carbs naturally raises the amount of glucose in your blood. Carb counting is a method for keeping track of how many carbs you eat. Counting carbs is important to keep your blood glucose at a healthy level, especially if you use insulin or take certain oral diabetes medicines.  It is important to know how many carbs you can safely have in each meal. This is different for every person. Your dietitian can help you calculate how many carbs you should have at each meal and for each snack.  Foods that contain carbs include:  · Bread, cereal, rice, pasta, and crackers.  · Potatoes and corn.  · Peas, beans, and lentils.  · Milk and yogurt.  · Fruit and juice.  · Desserts, such as cakes, cookies, ice cream, and candy.  How does alcohol affect me?  Alcohol can cause a sudden decrease in blood glucose (hypoglycemia),  especially if you use insulin or take certain oral diabetes medicines. Hypoglycemia can be a life-threatening condition. Symptoms of hypoglycemia (sleepiness, dizziness, and confusion) are similar to symptoms of having too much alcohol.  If your health care provider says that alcohol is safe for you, follow these guidelines:  · Limit alcohol intake to no more than 1 drink per day for nonpregnant women and 2 drinks per day for men. One drink equals 12 oz of beer, 5 oz of wine, or 1½ oz of hard liquor.  · Do not drink on an empty stomach.  · Keep yourself hydrated with water, diet soda, or unsweetened iced tea.  · Keep in mind that regular soda, juice, and other mixers may contain a lot of sugar and must be counted as carbs.  What are tips for following this plan?    Reading food labels  · Start by checking the serving size on the "Nutrition Facts" label of packaged foods and drinks. The amount of calories, carbs, fats, and other nutrients listed on the label is based on one serving of the item. Many items contain more than one serving per package.  · Check the total grams (g) of carbs in one serving. You can calculate the number of servings of carbs in one serving by dividing the total carbs by 15. For example, if a food has 30 g of total carbs, it would be equal to 2   servings of carbs.  · Check the number of grams (g) of saturated and trans fats in one serving. Choose foods that have low or no amount of these fats.  · Check the number of milligrams (mg) of salt (sodium) in one serving. Most people should limit total sodium intake to less than 2,300 mg per day.  · Always check the nutrition information of foods labeled as "low-fat" or "nonfat". These foods may be higher in added sugar or refined carbs and should be avoided.  · Talk to your dietitian to identify your daily goals for nutrients listed on the label.  Shopping  · Avoid buying canned, premade, or processed foods. These foods tend to be high in fat, sodium,  and added sugar.  · Shop around the outside edge of the grocery store. This includes fresh fruits and vegetables, bulk grains, fresh meats, and fresh dairy.  Cooking  · Use low-heat cooking methods, such as baking, instead of high-heat cooking methods like deep frying.  · Cook using healthy oils, such as olive, canola, or sunflower oil.  · Avoid cooking with butter, cream, or high-fat meats.  Meal planning  · Eat meals and snacks regularly, preferably at the same times every day. Avoid going long periods of time without eating.  · Eat foods high in fiber, such as fresh fruits, vegetables, beans, and whole grains. Talk to your dietitian about how many servings of carbs you can eat at each meal.  · Eat 4-6 ounces (oz) of lean protein each day, such as lean meat, chicken, fish, eggs, or tofu. One oz of lean protein is equal to:  ? 1 oz of meat, chicken, or fish.  ? 1 egg.  ? ¼ cup of tofu.  · Eat some foods each day that contain healthy fats, such as avocado, nuts, seeds, and fish.  Lifestyle  · Check your blood glucose regularly.  · Exercise regularly as told by your health care provider. This may include:  ? 150 minutes of moderate-intensity or vigorous-intensity exercise each week. This could be brisk walking, biking, or water aerobics.  ? Stretching and doing strength exercises, such as yoga or weightlifting, at least 2 times a week.  · Take medicines as told by your health care provider.  · Do not use any products that contain nicotine or tobacco, such as cigarettes and e-cigarettes. If you need help quitting, ask your health care provider.  · Work with a counselor or diabetes educator to identify strategies to manage stress and any emotional and social challenges.  Questions to ask a health care provider  · Do I need to meet with a diabetes educator?  · Do I need to meet with a dietitian?  · What number can I call if I have questions?  · When are the best times to check my blood glucose?  Where to find more  information:  · American Diabetes Association: diabetes.org  · Academy of Nutrition and Dietetics: www.eatright.org  · National Institute of Diabetes and Digestive and Kidney Diseases (NIH): www.niddk.nih.gov  Summary  · A healthy meal plan will help you control your blood glucose and maintain a healthy lifestyle.  · Working with a diet and nutrition specialist (dietitian) can help you make a meal plan that is best for you.  · Keep in mind that carbohydrates (carbs) and alcohol have immediate effects on your blood glucose levels. It is important to count carbs and to use alcohol carefully.  This information is not intended to   replace advice given to you by your health care provider. Make sure you discuss any questions you have with your health care provider.  Document Released: 07/30/2005 Document Revised: 06/02/2017 Document Reviewed: 12/07/2016  Elsevier Interactive Patient Education © 2019 [employer]

## 2019-03-22 ENCOUNTER — Other Ambulatory Visit: Payer: Self-pay

## 2019-03-22 ENCOUNTER — Ambulatory Visit: Payer: Medicaid Other | Admitting: Internal Medicine

## 2019-03-27 ENCOUNTER — Other Ambulatory Visit: Payer: Self-pay | Admitting: Adult Health

## 2019-03-27 ENCOUNTER — Ambulatory Visit: Payer: Medicare Other | Admitting: Internal Medicine

## 2019-03-29 ENCOUNTER — Telehealth: Payer: Self-pay | Admitting: Adult Health

## 2019-03-29 NOTE — Telephone Encounter (Signed)
Izora Gala from [facility] called in regards to ms boswells lantus, patient has been using it 75 units a day and extra 10 units at meals, patient is using medication wrong and on Monday the technician spent 30 minutes on the phone explaining how to taker her lantus and humalog. I spoke with the patient and she told me that she was taken the medication wrong and does understand that she is to take lantus 75 units once a day, and 10 units of the humalog with sliding scale. Patient states she does understand now.  Touched base with Izora Gala again and states that she is going to start marking her Lantus as only once a day to see if this will help her.

## 2019-04-04 ENCOUNTER — Telehealth: Payer: Self-pay

## 2019-04-04 NOTE — Telephone Encounter (Signed)
Pt called her glucose 500 she took 30 minutes ago novolog 20 units and also lantus 65 units as per dr advised pt need to eat a light lunch and gave few minutes and recheck again if sugar high take 4 units for novolog and also keep track glucose reading and we can see her Thursday at 11 bring all her med at visit

## 2019-04-05 ENCOUNTER — Ambulatory Visit: Payer: Medicare Other | Admitting: Nurse Practitioner

## 2019-04-06 ENCOUNTER — Ambulatory Visit: Payer: Medicare Other | Admitting: Internal Medicine

## 2019-04-11 ENCOUNTER — Telehealth: Payer: Self-pay | Admitting: Internal Medicine

## 2019-04-11 ENCOUNTER — Other Ambulatory Visit: Payer: Self-pay

## 2019-04-11 ENCOUNTER — Encounter: Payer: Self-pay | Admitting: Internal Medicine

## 2019-04-11 ENCOUNTER — Ambulatory Visit (INDEPENDENT_AMBULATORY_CARE_PROVIDER_SITE_OTHER): Payer: Medicare Other | Admitting: Internal Medicine

## 2019-04-11 VITALS — BP 126/76 | HR 80 | Temp 97.6°F | Resp 16 | Ht 65.0 in | Wt 213.6 lb

## 2019-04-11 DIAGNOSIS — N390 Urinary tract infection, site not specified: Secondary | ICD-10-CM

## 2019-04-11 DIAGNOSIS — M545 Low back pain, unspecified: Secondary | ICD-10-CM

## 2019-04-11 DIAGNOSIS — E1165 Type 2 diabetes mellitus with hyperglycemia: Secondary | ICD-10-CM | POA: Diagnosis not present

## 2019-04-11 DIAGNOSIS — R300 Dysuria: Secondary | ICD-10-CM

## 2019-04-11 DIAGNOSIS — Z9119 Patient's noncompliance with other medical treatment and regimen: Secondary | ICD-10-CM | POA: Diagnosis not present

## 2019-04-11 DIAGNOSIS — Z91199 Patient's noncompliance with other medical treatment and regimen due to unspecified reason: Secondary | ICD-10-CM

## 2019-04-11 LAB — POCT URINALYSIS DIPSTICK
Bilirubin, UA: NEGATIVE
Blood, UA: NEGATIVE
Glucose, UA: POSITIVE — AB
Ketones, UA: NEGATIVE
Leukocytes, UA: NEGATIVE
Nitrite, UA: NEGATIVE
Protein, UA: NEGATIVE
Spec Grav, UA: 1.01 (ref 1.010–1.025)
Urobilinogen, UA: 0.2 E.U./dL
pH, UA: 5 (ref 5.0–8.0)

## 2019-04-11 MED ORDER — DAPAGLIFLOZIN PROPANEDIOL 5 MG PO TABS: 5.0000 mg | ORAL_TABLET | Freq: Every day | ORAL | 3 refills | Status: DC

## 2019-04-11 NOTE — Telephone Encounter (Signed)
Spoke with Chelsey [name] in regards to home health for medication management ,[facility] will be taken on this case.

## 2019-04-11 NOTE — Addendum Note (Signed)
Addended by: Corlis Hove on: 04/11/2019 02:31 PM   Modules accepted: Orders

## 2019-04-11 NOTE — Progress Notes (Signed)
[facility] [place], [place] [address]  Internal MEDICINE  Office Visit Note  Patient Name: Chelsey [name]  [id number]  [id number]  Date of Service: 04/11/2019  Chief Complaint  Patient presents with  . Diabetes    blood sugar this am was 335   . Back Pain    back and groin pain, started this past weekend ,    HPI Pt continues to have elevated blood sugars, she is very confused about her medication list. She did not bring all of her meds either. According her she is taking all her meds as prescribed. Am glucose has been higher as well  Denies any dysuria, back pain, she did not take any tyelnol or motrin    Current Medication: Outpatient Encounter Medications as of 04/11/2019  Medication Sig Note  . albuterol (PROAIR HFA) 108 (90 Base) MCG/ACT inhaler Inhale 2 puffs into the lungs every 6 (six) hours as needed for wheezing or shortness of breath.   . allopurinol (ZYLOPRIM) 300 MG tablet Take 300 mg by mouth daily.    Marland Kitchen amLODipine (NORVASC) 10 MG tablet TAKE 1 TABLET BY MOUTH DAILY   . aspirin EC 81 MG tablet Take 81 mg by mouth daily.   . Blood Glucose Monitoring Suppl (ACCU-CHEK AVIVA PLUS) W/DEVICE KIT Use as directed. 12/20/2013: Received from: External Pharmacy  . COLCRYS 0.6 MG tablet Take 0.6 mg by mouth 2 (two) times daily as needed (for gout flares.).  09/20/2013: Received from: External Pharmacy  . dapagliflozin propanediol (FARXIGA) 5 MG TABS tablet Take 5 mg by mouth daily.   . Dulaglutide (TRULICITY) 1.5 TG/6.2IR SOPN Inject 1.5 mg into the skin once a week.   . ferrous sulfate 325 (65 FE) MG tablet Take 325 mg by mouth daily with breakfast.   . fluticasone (FLONASE) 50 MCG/ACT nasal spray Frequency:QD   Dosage:50   MCG  Instructions:  Note:2 sprays each nostril daily Dose: 50MCG 08/21/2016: Received from: [facility]  . Fluticasone-Salmeterol (ADVAIR) 250-50 MCG/DOSE AEPB Inhale 1 puff into the lungs 2 (two) times daily.   Marland Kitchen gabapentin  (NEURONTIN) 300 MG capsule Take 300 mg by mouth 3 (three) times daily.   Marland Kitchen glucose blood test strip 1 each daily. Use 1 test strip daily. 09/20/2013: Received from: External Pharmacy  . hydroxychloroquine (PLAQUENIL) 200 MG tablet TAKE 1 TABLET BY MOUTH TWICE A DAY   . insulin lispro (HUMALOG) 100 UNIT/ML KwikPen INJECT 10 UNITS INTO THE SKIN WITH MEAL IF SUGAR IS HIGH BY SLIDING SCALE (MAX 65 UNITS PER DAY)   . Insulin Pen Needle (FIFTY50 PEN NEEDLES) 31G X 8 MM MISC USE THREE TIMES A DAY   . LANCETS ULTRA THIN MISC Apply 1 each topically daily.  09/20/2013: Received from: External Pharmacy  . LANTUS SOLOSTAR 100 UNIT/ML Solostar Pen Inject 75 Units into the skin daily.   Marland Kitchen levothyroxine (SYNTHROID, LEVOTHROID) 50 MCG tablet Take one tab in am on empty stomach for underactive thyroid   . losartan (COZAAR) 50 MG tablet Take 1 tablet (50 mg total) by mouth daily. For htn   . omeprazole (PRILOSEC) 20 MG capsule Take 20 mg by mouth 2 (two) times daily before a meal.  09/20/2013: Received from: External Pharmacy  . OXYGEN Inhale 3 L into the lungs. Nighttime use   . polyethylene glycol (MIRALAX / GLYCOLAX) packet Take 17 g by mouth daily.   . simvastatin (ZOCOR) 20 MG tablet Take 20 mg by mouth daily with lunch.  12/20/2013: Received from: External Pharmacy  . tretinoin (RETIN-A) 0.025 % cream Apply topically.   . triamcinolone cream (KENALOG) 0.1 % Apply twice a day to affected areas of dry itchy skin until clear   . [DISCONTINUED] dapagliflozin propanediol (FARXIGA) 5 MG TABS tablet Take 5 mg by mouth daily.   . hydrALAZINE (APRESOLINE) 10 MG tablet Take 1 tablet (10 mg total) by mouth 3 (three) times daily. (Patient not taking: Reported on 04/11/2019)    Facility-Administered Encounter Medications as of 04/11/2019  Medication  . betamethasone acetate-betamethasone sodium phosphate (CELESTONE) injection 3 mg    Surgical History: Past Surgical History:  Procedure Laterality Date  . BRAIN SURGERY   1990's  . CATARACT EXTRACTION W/PHACO Left 06/11/2016   Procedure: CATARACT EXTRACTION PHACO AND INTRAOCULAR LENS PLACEMENT (IOC);  Surgeon: Birder Robson, MD;  Location: [facility] ORS;  Service: Ophthalmology;  Laterality: Left;  [place] 1.01 AP% 21.6 CDE 13.23 Fluid pack lot # [id number]  . CEREBRAL ANEURYSM REPAIR  1990's   COILS  . CORONARY ARTERY BYPASS GRAFT    . EYE SURGERY  2000's   bilateral cataract  . FRACTURE SURGERY    . HERNIA REPAIR  2000   ventral  . STENT PLACEMENT VASCULAR ([place] HX)    . VEIN BYPASS SURGERY    . VENTRAL HERNIA REPAIR N/A 04/29/2016   Procedure: Laparoscopic HERNIA REPAIR VENTRAL ADULT;  Surgeon: Jules Husbands, MD;  Location: [facility] ORS;  Service: General;  Laterality: N/A;    Medical History: Past Medical History:  Diagnosis Date  . Anemia   . Aneurysm ([place])   . Arthritis    RHEUMATOID ARTHRITIS  . Asthma   . Collagen vascular disease ([place])   . COPD (chronic obstructive pulmonary disease) ([place])   . Coronary artery disease   . Diabetes mellitus without complication ([place])   . Edema    FEET/LEGS  . GERD (gastroesophageal reflux disease)   . Gout   . H/O wheezing   . History of hiatal hernia   . Hypertension   . Hypothyroidism   . Neuropathy   . Oxygen deficiency    HS  . Peripheral vascular disease ([place])   . Sarcoidosis of lung ([place])   . Seizures ([place])    WITH BRAIN 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   . Sleep apnea    OXYGEN AT NIGHT 3 L [place]    Family History: Family History  Problem Relation Age of Onset  . Heart disease Mother   . Hypertension Mother   . Diabetes Mother   . Diabetes Father   . Heart disease Father   . Hypertension Father     Social History   Socioeconomic History  . Marital status: Single    Spouse name: Not on file  . Number of children: Not on file  . Years of education: Not on file  . Highest education level: Not on file  Occupational History  . Not on file  Social Needs  . Financial resource strain: Not on file   . Food insecurity:    Worry: Not on file    Inability: Not on file  . Transportation needs:    Medical: Not on file    Non-medical: Not on file  Tobacco Use  . Smoking status: Former Smoker    Packs/day: 1.00    Years: 30.00    Pack years: 30.00    Types: Cigarettes    Last attempt to quit: 01/27/2006    Years since quitting: 13.2  . Smokeless  tobacco: Never Used  . Tobacco comment: quit   Substance and Sexual Activity  . Alcohol use: No    Alcohol/week: 0.0 standard drinks  . Drug use: No  . Sexual activity: Not on file  Lifestyle  . Physical activity:    Days per week: Not on file    Minutes per session: Not on file  . Stress: Not on file  Relationships  . Social connections:    Talks on phone: Not on file    Gets together: Not on file    Attends religious service: Not on file    Active member of club or organization: Not on file    Attends meetings of clubs or organizations: Not on file    Relationship status: Not on file  . Intimate partner violence:    Fear of current or ex partner: Not on file    Emotionally abused: Not on file    Physically abused: Not on file    Forced sexual activity: Not on file  Other Topics Concern  . Not on file  Social History Narrative  . Not on file    Review of Systems  Constitutional: Negative.   Eyes: Negative.   Respiratory: Negative for choking and chest tightness.   Cardiovascular: Negative for chest pain and leg swelling.  Gastrointestinal: Negative.   Endocrine: Positive for polydipsia and polyuria.       Elevated glucose   Musculoskeletal: Positive for arthralgias and back pain.  Hematological: Negative.   Psychiatric/Behavioral: The patient is nervous/anxious.     Vital Signs: BP 126/76 (BP Location: Left Arm, Patient Position: Sitting, Cuff Size: Normal)   Pulse 80   Temp 97.6 F (36.4 C)   Resp 16   Ht 5' 5"  (1.651 m)   Wt 213 lb 9.6 oz (96.9 kg)   SpO2 95%   BMI 35.54 kg/m    Physical  Exam Constitutional:      General: She is not in acute distress.    Appearance: She is well-developed. She is not diaphoretic.  HENT:     Head: Normocephalic and atraumatic.     Mouth/Throat:     Pharynx: No oropharyngeal exudate.  Eyes:     Pupils: Pupils are equal, round, and reactive to light.  Neck:     Musculoskeletal: Normal range of motion and neck supple.     Thyroid: No thyromegaly.     Vascular: No JVD.     Trachea: No tracheal deviation.  Cardiovascular:     Rate and Rhythm: Normal rate and regular rhythm.     Heart sounds: Normal heart sounds. No murmur. No friction rub. No gallop.   Pulmonary:     Effort: Pulmonary effort is normal. No respiratory distress.     Breath sounds: No wheezing or rales.  Chest:     Chest wall: No tenderness.  Abdominal:     General: Bowel sounds are normal.     Palpations: Abdomen is soft.  Musculoskeletal: Normal range of motion.  Lymphadenopathy:     Cervical: No cervical adenopathy.  Skin:    General: Skin is warm and dry.  Neurological:     Mental Status: She is alert and oriented to person, place, and time.     Cranial Nerves: No cranial nerve deficit.  Psychiatric:        Behavior: Behavior normal.        Thought Content: Thought content normal.        Judgment: Judgment normal.  Assessment/Plan: 1. Uncontrolled type 2 diabetes mellitus with hyperglycemia (HCC) - Change Lantus 70 units at bed time, Humalog 20units before each meal, Trulicity 1.5 qweek, Farxiga 5 mg qd - Ambulatory referral to [facility] to help and manage her diabetes, might need PT  2. Current non-adherence to medical treatment - Pt seems to be confused, will need some sort of assistance with home care  3. Morbid obesity ([place]) - Monitor calcories  4. Right-sided low back pain without sciatica, unspecified chronicity - Pt will try OTC tylenol or motrin for now   5. Dysuria - POCT Urinalysis Dipstick. Sent urine for c/s  General Counseling: Rhyder  verbalizes understanding of the findings of todays visit and agrees with plan of treatment. I have discussed any further diagnostic evaluation that may be needed or ordered today. We also reviewed her medications today. she has been encouraged to call the office with any questions or concerns that should arise related to todays visit.    Orders Placed This Encounter  Procedures  . Ambulatory referral to Home Health  . POCT Urinalysis Dipstick    Meds ordered this encounter  Medications  . dapagliflozin propanediol (FARXIGA) 5 MG TABS tablet    Sig: Take 5 mg by mouth daily.    Dispense:  30 mg    Refill:  3   Time spent:25 Minutes      Dr Lavera Guise Internal medicine

## 2019-04-12 ENCOUNTER — Telehealth: Payer: Self-pay

## 2019-04-13 ENCOUNTER — Telehealth: Payer: Self-pay

## 2019-04-13 ENCOUNTER — Other Ambulatory Visit: Payer: Self-pay

## 2019-04-13 MED ORDER — LEVOFLOXACIN 500 MG PO TABS: 500.0000 mg | ORAL_TABLET | Freq: Every day | ORAL | 0 refills | Status: AC

## 2019-04-13 NOTE — Telephone Encounter (Signed)
-----   Message from Lavera Guise, MD sent at 04/13/2019 10:15 AM EDT ----- Please see this

## 2019-04-13 NOTE — Telephone Encounter (Signed)
Left a message for [name] about pt blood pressure medication concerns from yesterday, left number if they have any questions to call us back per DFK this will be discussed with pt at her next in office visit so that she will not get confused about her medications.

## 2019-04-13 NOTE — Progress Notes (Signed)
Please see this

## 2019-04-13 NOTE — Telephone Encounter (Signed)
Both are fine for now, will reevaluate when she comes in, I dont want her to get confused since she already having problems

## 2019-04-13 NOTE — Progress Notes (Signed)
Call in Levaquin 500 mg po qd x10 days for UTI

## 2019-04-13 NOTE — Telephone Encounter (Signed)
Pt advised we send levaquin for UTI

## 2019-04-14 LAB — CULTURE, URINE COMPREHENSIVE

## 2019-04-17 ENCOUNTER — Encounter: Payer: Self-pay | Admitting: Adult Health

## 2019-04-17 ENCOUNTER — Other Ambulatory Visit: Payer: Self-pay

## 2019-04-17 ENCOUNTER — Ambulatory Visit (INDEPENDENT_AMBULATORY_CARE_PROVIDER_SITE_OTHER): Payer: Medicare Other | Admitting: Adult Health

## 2019-04-17 VITALS — BP 138/82 | HR 67 | Temp 97.6°F | Resp 16 | Ht 65.0 in | Wt 213.0 lb

## 2019-04-17 DIAGNOSIS — Z91199 Patient's noncompliance with other medical treatment and regimen due to unspecified reason: Secondary | ICD-10-CM

## 2019-04-17 DIAGNOSIS — E1165 Type 2 diabetes mellitus with hyperglycemia: Secondary | ICD-10-CM | POA: Diagnosis not present

## 2019-04-17 DIAGNOSIS — N390 Urinary tract infection, site not specified: Secondary | ICD-10-CM

## 2019-04-17 DIAGNOSIS — Z9119 Patient's noncompliance with other medical treatment and regimen: Secondary | ICD-10-CM | POA: Diagnosis not present

## 2019-04-17 LAB — GLUCOSE, POCT (MANUAL RESULT ENTRY): POC Glucose: 246 mg/dl — AB (ref 70–99)

## 2019-04-17 NOTE — Progress Notes (Signed)
[facility] [place], [place] [address]  Internal MEDICINE  Office Visit Note  Patient Name: Chelsey [name]  [id number]  [id number]  Date of Service: 04/17/2019  Chief Complaint  Patient presents with  . Medical Management of Chronic Issues  . Diabetes    3 week follow up medication changes bs 246 , samples of farxiga 91m was given at last appt , home health has come into her home for medication management   . Urinary Tract Infection    still on antibiotic lower abdominal and groin area is still painful     HPI Pt is here for follow up on DM.  Her blood sugar is 246 mg/dl in office this morning.  She reports she can not remember what her sugar was this morning.  She reports she ate a corned-beef sandwich for breakfast.  She states her sugar is typically below 250 in the mornings when she checks it.  It was elevated last week which is likely from the UTI she currently has. She continues to have some symptoms from this, today is day 5 of levaquin.         Current Medication: Outpatient Encounter Medications as of 04/17/2019  Medication Sig Note  . albuterol (PROAIR HFA) 108 (90 Base) MCG/ACT inhaler Inhale 2 puffs into the lungs every 6 (six) hours as needed for wheezing or shortness of breath.   . allopurinol (ZYLOPRIM) 300 MG tablet Take 300 mg by mouth daily.    .Marland KitchenamLODipine (NORVASC) 10 MG tablet TAKE 1 TABLET BY MOUTH DAILY   . aspirin EC 81 MG tablet Take 81 mg by mouth daily.   . Blood Glucose Monitoring Suppl (ACCU-CHEK AVIVA PLUS) W/DEVICE KIT Use as directed. 12/20/2013: Received from: External Pharmacy  . COLCRYS 0.6 MG tablet Take 0.6 mg by mouth 2 (two) times daily as needed (for gout flares.).  09/20/2013: Received from: External Pharmacy  . dapagliflozin propanediol (FARXIGA) 5 MG TABS tablet Take 5 mg by mouth daily. (Patient taking differently: Take 5 mg by mouth daily. farxiga 181msamples right now)   . Dulaglutide (TRULICITY) 1.5 MGYT/0.1SWOPN  Inject 1.5 mg into the skin once a week.   . ferrous sulfate 325 (65 FE) MG tablet Take 325 mg by mouth daily with breakfast.   . fluticasone (FLONASE) 50 MCG/ACT nasal spray Frequency:QD   Dosage:50   MCG  Instructions:  Note:2 sprays each nostril daily Dose: 50MCG [facility]2016[facility]. Fluticasone-Salmeterol (ADVAIR) 250-50 MCG/DOSE AEPB Inhale 1 puff into the lungs 2 (two) times daily.   . Marland Kitchenabapentin (NEURONTIN) 300 MG capsule Take 300 mg by mouth 3 (three) times daily.   . Marland Kitchenlucose blood test strip 1 each daily. Use 1 test strip daily. 09/20/2013: Received from: External Pharmacy  . hydrALAZINE (APRESOLINE) 10 MG tablet Take 1 tablet (10 mg total) by mouth 3 (three) times daily. (Patient not taking: Reported on 04/11/2019)   . hydroxychloroquine (PLAQUENIL) 200 MG tablet TAKE 1 TABLET BY MOUTH TWICE A DAY   . insulin lispro (HUMALOG) 100 UNIT/ML KwikPen INJECT 10 UNITS INTO THE SKIN WITH MEAL IF SUGAR IS HIGH BY SLIDING SCALE (MAX 65 UNITS PER DAY) (Patient taking differently: INJECT 20 UNITS INTO THE SKIN WITH MEAL IF SUGAR IS HIGH BY SLIDING SCALE (MAX 65 UNITS PER DAY))   . Insulin Pen Needle (FIFTY50 PEN NEEDLES) 31G X 8 MM MISC USE THREE TIMES A DAY   . [address]  Apply 1 each topically daily.  09/20/2013: Received from: External Pharmacy  . LANTUS SOLOSTAR 100 UNIT/ML Solostar Pen Inject 75 Units into the skin daily. (Patient taking differently: Inject 70 Units into the skin daily. )   . levofloxacin (LEVAQUIN) 500 MG tablet Take 1 tablet (500 mg total) by mouth daily for 10 days.   Marland Kitchen levothyroxine (SYNTHROID, LEVOTHROID) 50 MCG tablet Take one tab in am on empty stomach for underactive thyroid   . losartan (COZAAR) 50 MG tablet Take 1 tablet (50 mg total) by mouth daily. For htn   . omeprazole (PRILOSEC) 20 MG capsule Take 20 mg by mouth 2 (two) times daily before a meal.  09/20/2013: Received from: External Pharmacy  . OXYGEN Inhale 3 L into the lungs.  Nighttime use   . polyethylene glycol (MIRALAX / GLYCOLAX) packet Take 17 g by mouth daily.   . simvastatin (ZOCOR) 20 MG tablet Take 20 mg by mouth daily with lunch.  12/20/2013: Received from: External Pharmacy  . tretinoin (RETIN-A) 0.025 % cream Apply topically.   . triamcinolone cream (KENALOG) 0.1 % Apply twice a day to affected areas of dry itchy skin until clear   . [DISCONTINUED] dapagliflozin propanediol (FARXIGA) 5 MG TABS tablet Take 5 mg by mouth daily.    Facility-Administered Encounter Medications as of 04/17/2019  Medication  . betamethasone acetate-betamethasone sodium phosphate (CELESTONE) injection 3 mg    Surgical History: Past Surgical History:  Procedure Laterality Date  . BRAIN SURGERY  1990's  . CATARACT EXTRACTION W/PHACO Left 06/11/2016   Procedure: CATARACT EXTRACTION PHACO AND INTRAOCULAR LENS PLACEMENT (IOC);  Surgeon: Birder Robson, MD;  Location: [facility] ORS;  Service: Ophthalmology;  Laterality: Left;  [place] 1.01 AP% 21.6 CDE 13.23 Fluid pack lot # [id number]  . CEREBRAL ANEURYSM REPAIR  1990's   COILS  . CORONARY ARTERY BYPASS GRAFT    . EYE SURGERY  2000's   bilateral cataract  . FRACTURE SURGERY    . HERNIA REPAIR  2000   ventral  . STENT PLACEMENT VASCULAR ([facility])    . VEIN BYPASS SURGERY    . VENTRAL HERNIA REPAIR N/A 04/29/2016   Procedure: Laparoscopic HERNIA REPAIR VENTRAL ADULT;  Surgeon: Jules Husbands, MD;  Location: [facility] ORS;  Service: General;  Laterality: N/A;    Medical History: Past Medical History:  Diagnosis Date  . Anemia   . Aneurysm ([place])   . Arthritis    RHEUMATOID ARTHRITIS  . Asthma   . Collagen vascular disease ([place])   . COPD (chronic obstructive pulmonary disease) ([place])   . Coronary artery disease   . Diabetes mellitus without complication ([place])   . Edema    FEET/LEGS  . GERD (gastroesophageal reflux disease)   . Gout   . H/O wheezing   . History of hiatal hernia   . Hypertension   . Hypothyroidism   . Neuropathy    . Oxygen deficiency    HS  . Peripheral vascular disease ([place])   . Sarcoidosis of lung ([place])   . Seizures ([place])    WITH BRAIN 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   . Sleep apnea    OXYGEN AT NIGHT 3 L [place]    Family History: Family History  Problem Relation Age of Onset  . Heart disease Mother   . Hypertension Mother   . Diabetes Mother   . Diabetes Father   . Heart disease Father   . Hypertension Father     Social History   Socioeconomic History  .  Marital status: Single    Spouse name: Not on file  . Number of children: Not on file  . Years of education: Not on file  . Highest education level: Not on file  Occupational History  . Not on file  Social Needs  . Financial resource strain: Not on file  . Food insecurity:    Worry: Not on file    Inability: Not on file  . Transportation needs:    Medical: Not on file    Non-medical: Not on file  Tobacco Use  . Smoking status: Former Smoker    Packs/day: 1.00    Years: 30.00    Pack years: 30.00    Types: Cigarettes    Last attempt to quit: 01/27/2006    Years since quitting: 13.2  . Smokeless tobacco: Never Used  . Tobacco comment: quit   Substance and Sexual Activity  . Alcohol use: No    Alcohol/week: 0.0 standard drinks  . Drug use: No  . Sexual activity: Not on file  Lifestyle  . Physical activity:    Days per week: Not on file    Minutes per session: Not on file  . Stress: Not on file  Relationships  . Social connections:    Talks on phone: Not on file    Gets together: Not on file    Attends religious service: Not on file    Active member of club or organization: Not on file    Attends meetings of clubs or organizations: Not on file    Relationship status: Not on file  . Intimate partner violence:    Fear of current or ex partner: Not on file    Emotionally abused: Not on file    Physically abused: Not on file    Forced sexual activity: Not on file  Other Topics Concern  . Not on file  Social History  Narrative  . Not on file      Review of Systems  Constitutional: Negative for chills, fatigue and unexpected weight change.  HENT: Negative for congestion, rhinorrhea, sneezing and sore throat.   Eyes: Negative for photophobia, pain and redness.  Respiratory: Negative for cough, chest tightness and shortness of breath.   Cardiovascular: Negative for chest pain and palpitations.  Gastrointestinal: Negative for abdominal pain, constipation, diarrhea, nausea and vomiting.  Endocrine: Negative.   Genitourinary: Negative for dysuria and frequency.  Musculoskeletal: Negative for arthralgias, back pain, joint swelling and neck pain.  Skin: Negative for rash.  Allergic/Immunologic: Negative.   Neurological: Negative for tremors and numbness.  Hematological: Negative for adenopathy. Does not bruise/bleed easily.  Psychiatric/Behavioral: Negative for behavioral problems and sleep disturbance. The patient is not nervous/anxious.     Vital Signs: BP 138/82   Pulse 67   Temp 97.6 F (36.4 C)   Resp 16   Ht 5' 5"  (1.651 m)   Wt 213 lb (96.6 kg)   SpO2 94%   BMI 35.45 kg/m    Physical Exam Vitals signs and nursing note reviewed.  Constitutional:      General: She is not in acute distress.    Appearance: She is well-developed. She is not diaphoretic.  HENT:     Head: Normocephalic and atraumatic.     Mouth/Throat:     Pharynx: No oropharyngeal exudate.  Eyes:     Pupils: Pupils are equal, round, and reactive to light.  Neck:     Musculoskeletal: Normal range of motion and neck supple.     Thyroid:  No thyromegaly.     Vascular: No JVD.     Trachea: No tracheal deviation.  Cardiovascular:     Rate and Rhythm: Normal rate and regular rhythm.     Heart sounds: Normal heart sounds. No murmur. No friction rub. No gallop.   Pulmonary:     Effort: Pulmonary effort is normal. No respiratory distress.     Breath sounds: Normal breath sounds. No wheezing or rales.  Chest:     Chest  wall: No tenderness.  Abdominal:     Palpations: Abdomen is soft.     Tenderness: There is no abdominal tenderness. There is no guarding.  Musculoskeletal: Normal range of motion.  Lymphadenopathy:     Cervical: No cervical adenopathy.  Skin:    General: Skin is warm and dry.  Neurological:     Mental Status: She is alert and oriented to person, place, and time.     Cranial Nerves: No cranial nerve deficit.  Psychiatric:        Behavior: Behavior normal.        Thought Content: Thought content normal.        Judgment: Judgment normal.     Assessment/Plan: 1. Uncontrolled type 2 diabetes mellitus with hyperglycemia (HCC) Conttinue medications as directed.   - POCT glucose (manual entry)  2. Current non-adherence to medical treatment Discussed importance of taking medications as directed as well as, diet.    3. Urinary tract infection without hematuria, site unspecified Continue taking Levaquin, if symptoms do not improve in the next 3-5 days return to clinic for follow up urine.   4. Morbid obesity ([place]) Obesity Counseling: Risk Assessment: An assessment of behavioral risk factors was made today and includes lack of exercise sedentary lifestyle, lack of portion control and poor dietary habits.  Risk Modification Advice: She was counseled on portion control guidelines. Restricting daily caloric intake to.  The detrimental long term effects of obesity on her health and ongoing poor compliance was also discussed with the patient.    General Counseling: Betha verbalizes understanding of the findings of todays visit and agrees with plan of treatment. I have discussed any further diagnostic evaluation that may be needed or ordered today. We also reviewed her medications today. she has been encouraged to call the office with any questions or concerns that should arise related to todays visit.    Orders Placed This Encounter  Procedures  . POCT glucose (manual entry)    No orders  of the defined types were placed in this encounter.   Time spent: 25 Minutes   This patient was seen by Orson Gear AGNP-C in Collaboration with Dr Lavera Guise as a part of collaborative care agreement     Kendell Bane AGNP-C Internal medicine

## 2019-04-17 NOTE — Patient Instructions (Signed)
Diabetes Mellitus and Nutrition, Adult  When you have diabetes (diabetes mellitus), it is very important to have healthy eating habits because your blood sugar (glucose) levels are greatly affected by what you eat and drink. Eating healthy foods in the appropriate amounts, at about the same times every day, can help you:  · Control your blood glucose.  · Lower your risk of heart disease.  · Improve your blood pressure.  · Reach or maintain a healthy weight.  Every person with diabetes is different, and each person has different needs for a meal plan. Your health care provider may recommend that you work with a diet and nutrition specialist (dietitian) to make a meal plan that is best for you. Your meal plan may vary depending on factors such as:  · The calories you need.  · The medicines you take.  · Your weight.  · Your blood glucose, blood pressure, and cholesterol levels.  · Your activity level.  · Other health conditions you have, such as heart or kidney disease.  How do carbohydrates affect me?  Carbohydrates, also called carbs, affect your blood glucose level more than any other type of food. Eating carbs naturally raises the amount of glucose in your blood. Carb counting is a method for keeping track of how many carbs you eat. Counting carbs is important to keep your blood glucose at a healthy level, especially if you use insulin or take certain oral diabetes medicines.  It is important to know how many carbs you can safely have in each meal. This is different for every person. Your dietitian can help you calculate how many carbs you should have at each meal and for each snack.  Foods that contain carbs include:  · Bread, cereal, rice, pasta, and crackers.  · Potatoes and corn.  · Peas, beans, and lentils.  · Milk and yogurt.  · Fruit and juice.  · Desserts, such as cakes, cookies, ice cream, and candy.  How does alcohol affect me?  Alcohol can cause a sudden decrease in blood glucose (hypoglycemia),  especially if you use insulin or take certain oral diabetes medicines. Hypoglycemia can be a life-threatening condition. Symptoms of hypoglycemia (sleepiness, dizziness, and confusion) are similar to symptoms of having too much alcohol.  If your health care provider says that alcohol is safe for you, follow these guidelines:  · Limit alcohol intake to no more than 1 drink per day for nonpregnant women and 2 drinks per day for men. One drink equals 12 oz of beer, 5 oz of wine, or 1½ oz of hard liquor.  · Do not drink on an empty stomach.  · Keep yourself hydrated with water, diet soda, or unsweetened iced tea.  · Keep in mind that regular soda, juice, and other mixers may contain a lot of sugar and must be counted as carbs.  What are tips for following this plan?    Reading food labels  · Start by checking the serving size on the "Nutrition Facts" label of packaged foods and drinks. The amount of calories, carbs, fats, and other nutrients listed on the label is based on one serving of the item. Many items contain more than one serving per package.  · Check the total grams (g) of carbs in one serving. You can calculate the number of servings of carbs in one serving by dividing the total carbs by 15. For example, if a food has 30 g of total carbs, it would be equal to 2   servings of carbs.  · Check the number of grams (g) of saturated and trans fats in one serving. Choose foods that have low or no amount of these fats.  · Check the number of milligrams (mg) of salt (sodium) in one serving. Most people should limit total sodium intake to less than 2,300 mg per day.  · Always check the nutrition information of foods labeled as "low-fat" or "nonfat". These foods may be higher in added sugar or refined carbs and should be avoided.  · Talk to your dietitian to identify your daily goals for nutrients listed on the label.  Shopping  · Avoid buying canned, premade, or processed foods. These foods tend to be high in fat, sodium,  and added sugar.  · Shop around the outside edge of the grocery store. This includes fresh fruits and vegetables, bulk grains, fresh meats, and fresh dairy.  Cooking  · Use low-heat cooking methods, such as baking, instead of high-heat cooking methods like deep frying.  · Cook using healthy oils, such as olive, canola, or sunflower oil.  · Avoid cooking with butter, cream, or high-fat meats.  Meal planning  · Eat meals and snacks regularly, preferably at the same times every day. Avoid going long periods of time without eating.  · Eat foods high in fiber, such as fresh fruits, vegetables, beans, and whole grains. Talk to your dietitian about how many servings of carbs you can eat at each meal.  · Eat 4-6 ounces (oz) of lean protein each day, such as lean meat, chicken, fish, eggs, or tofu. One oz of lean protein is equal to:  ? 1 oz of meat, chicken, or fish.  ? 1 egg.  ? ¼ cup of tofu.  · Eat some foods each day that contain healthy fats, such as avocado, nuts, seeds, and fish.  Lifestyle  · Check your blood glucose regularly.  · Exercise regularly as told by your health care provider. This may include:  ? 150 minutes of moderate-intensity or vigorous-intensity exercise each week. This could be brisk walking, biking, or water aerobics.  ? Stretching and doing strength exercises, such as yoga or weightlifting, at least 2 times a week.  · Take medicines as told by your health care provider.  · Do not use any products that contain nicotine or tobacco, such as cigarettes and e-cigarettes. If you need help quitting, ask your health care provider.  · Work with a counselor or diabetes educator to identify strategies to manage stress and any emotional and social challenges.  Questions to ask a health care provider  · Do I need to meet with a diabetes educator?  · Do I need to meet with a dietitian?  · What number can I call if I have questions?  · When are the best times to check my blood glucose?  Where to find more  information:  · American Diabetes Association: diabetes.org  · Academy of Nutrition and Dietetics: www.eatright.org  · National Institute of Diabetes and Digestive and Kidney Diseases (NIH): www.niddk.nih.gov  Summary  · A healthy meal plan will help you control your blood glucose and maintain a healthy lifestyle.  · Working with a diet and nutrition specialist (dietitian) can help you make a meal plan that is best for you.  · Keep in mind that carbohydrates (carbs) and alcohol have immediate effects on your blood glucose levels. It is important to count carbs and to use alcohol carefully.  This information is not intended to   replace advice given to you by your health care provider. Make sure you discuss any questions you have with your health care provider.  Document Released: 07/30/2005 Document Revised: 06/02/2017 Document Reviewed: 12/07/2016  Elsevier Interactive Patient Education © 2019 [employer]

## 2019-04-20 ENCOUNTER — Telehealth: Payer: Self-pay

## 2019-04-21 ENCOUNTER — Other Ambulatory Visit: Payer: Self-pay | Admitting: Nurse Practitioner

## 2019-04-21 DIAGNOSIS — E1165 Type 2 diabetes mellitus with hyperglycemia: Secondary | ICD-10-CM

## 2019-04-21 MED ORDER — CANAGLIFLOZIN 100 MG PO TABS: 100.0000 mg | ORAL_TABLET | Freq: Every day | ORAL | 2 refills | Status: DC

## 2019-04-21 NOTE — Progress Notes (Signed)
D/c farxiga since not covered by insurance. Started invokana 100mg  daily and sent new prescription to [facility].

## 2019-04-21 NOTE — Telephone Encounter (Signed)
lmom/kp

## 2019-04-21 NOTE — Telephone Encounter (Signed)
D/c farxiga since not covered by insurance. Started invokana 100mg  daily and sent new prescription to [facility].

## 2019-05-01 ENCOUNTER — Ambulatory Visit (INDEPENDENT_AMBULATORY_CARE_PROVIDER_SITE_OTHER): Payer: Medicare Other | Admitting: Adult Health

## 2019-05-01 ENCOUNTER — Ambulatory Visit: Payer: Medicare Other

## 2019-05-01 ENCOUNTER — Other Ambulatory Visit: Payer: Self-pay

## 2019-05-01 ENCOUNTER — Encounter: Payer: Self-pay | Admitting: Adult Health

## 2019-05-01 VITALS — BP 118/74 | HR 76 | Temp 97.4°F | Resp 16 | Ht 63.0 in | Wt 211.0 lb

## 2019-05-01 DIAGNOSIS — R1031 Right lower quadrant pain: Secondary | ICD-10-CM

## 2019-05-01 DIAGNOSIS — I10 Essential (primary) hypertension: Secondary | ICD-10-CM | POA: Diagnosis not present

## 2019-05-01 DIAGNOSIS — R300 Dysuria: Secondary | ICD-10-CM

## 2019-05-01 LAB — POCT URINALYSIS DIPSTICK
Bilirubin, UA: NEGATIVE
Blood, UA: NEGATIVE
Glucose, UA: POSITIVE — AB
Ketones, UA: NEGATIVE
Leukocytes, UA: NEGATIVE
Nitrite, UA: NEGATIVE
Protein, UA: NEGATIVE
Spec Grav, UA: <=1.005 — AB (ref 1.010–1.025)
Urobilinogen, UA: 0.2 E.U./dL
pH, UA: 5 (ref 5.0–8.0)

## 2019-05-01 NOTE — Progress Notes (Signed)
[facility] [place], [place] [address]  Internal MEDICINE  Office Visit Note  Patient Name: Chelsey [name]  [id number]  [id number]  Date of Service: 05/01/2019  Chief Complaint  Patient presents with  . Groin Pain    still having pain in groin, abdomen and towards the back,      HPI Pt is here for a sick visit. Pt continues to have lower abdominal pain, and low back pain.  She denies any fever, diarrhea, or vomiting.  She mostly complains of RLQ pain. She has completed a course of antibiotics, and describes no relief.  At this visit she does report that a few years ago she had a hernia repair, and the surgeon "Moved something to the right side", per patient.  She is a poor historian at this time.      Current Medication:  Outpatient Encounter Medications as of 05/01/2019  Medication Sig Note  . albuterol (PROAIR HFA) 108 (90 Base) MCG/ACT inhaler Inhale 2 puffs into the lungs every 6 (six) hours as needed for wheezing or shortness of breath.   . allopurinol (ZYLOPRIM) 300 MG tablet Take 300 mg by mouth daily.    Marland Kitchen amLODipine (NORVASC) 10 MG tablet TAKE 1 TABLET BY MOUTH DAILY   . aspirin EC 81 MG tablet Take 81 mg by mouth daily.   . Blood Glucose Monitoring Suppl (ACCU-CHEK AVIVA PLUS) W/DEVICE KIT Use as directed. 12/20/2013: Received from: External Pharmacy  . COLCRYS 0.6 MG tablet Take 0.6 mg by mouth 2 (two) times daily as needed (for gout flares.).  09/20/2013: Received from: External Pharmacy  . dapagliflozin propanediol (FARXIGA) 10 MG TABS tablet Take 10 mg by mouth daily. Samples given   . Dulaglutide (TRULICITY) 1.5 WH/6.7RF SOPN Inject 1.5 mg into the skin once a week.   . ferrous sulfate 325 (65 FE) MG tablet Take 325 mg by mouth daily with breakfast.   . fluticasone (FLONASE) 50 MCG/ACT nasal spray Frequency:QD   Dosage:50   MCG  Instructions:  Note:2 sprays each nostril daily Dose: 50MCG 08/21/2016: Received from: [facility]  .  Fluticasone-Salmeterol (ADVAIR) 250-50 MCG/DOSE AEPB Inhale 1 puff into the lungs 2 (two) times daily.   Marland Kitchen gabapentin (NEURONTIN) 300 MG capsule Take 300 mg by mouth 3 (three) times daily.   Marland Kitchen glucose blood test strip 1 each daily. Use 1 test strip daily. 09/20/2013: Received from: External Pharmacy  . hydrALAZINE (APRESOLINE) 10 MG tablet Take 1 tablet (10 mg total) by mouth 3 (three) times daily.   . hydroxychloroquine (PLAQUENIL) 200 MG tablet TAKE 1 TABLET BY MOUTH TWICE A DAY   . insulin lispro (HUMALOG) 100 UNIT/ML KwikPen INJECT 10 UNITS INTO THE SKIN WITH MEAL IF SUGAR IS HIGH BY SLIDING SCALE (MAX 65 UNITS PER DAY) (Patient taking differently: INJECT 20 UNITS INTO THE SKIN WITH MEAL IF SUGAR IS HIGH BY SLIDING SCALE (MAX 65 UNITS PER DAY))   . Insulin Pen Needle (FIFTY50 PEN NEEDLES) 31G X 8 MM MISC USE THREE TIMES A DAY   . LANCETS ULTRA THIN MISC Apply 1 each topically daily.  09/20/2013: Received from: External Pharmacy  . LANTUS SOLOSTAR 100 UNIT/ML Solostar Pen Inject 75 Units into the skin daily. (Patient taking differently: Inject 70 Units into the skin daily. )   . levothyroxine (SYNTHROID, LEVOTHROID) 50 MCG tablet Take one tab in am on empty stomach for underactive thyroid   . losartan (COZAAR) 50 MG tablet Take 1 tablet (50 mg  total) by mouth daily. For htn   . omeprazole (PRILOSEC) 20 MG capsule Take 20 mg by mouth 2 (two) times daily before a meal.  09/20/2013: Received from: External Pharmacy  . OXYGEN Inhale 3 L into the lungs. Nighttime use   . polyethylene glycol (MIRALAX / GLYCOLAX) packet Take 17 g by mouth daily.   . simvastatin (ZOCOR) 20 MG tablet Take 20 mg by mouth daily with lunch.  12/20/2013: Received from: External Pharmacy  . tretinoin (RETIN-A) 0.025 % cream Apply topically.   . triamcinolone cream (KENALOG) 0.1 % Apply twice a day to affected areas of dry itchy skin until clear   . canagliflozin (INVOKANA) 100 MG TABS tablet Take 1 tablet (100 mg total) by mouth  daily before breakfast. (Patient not taking: Reported on 05/01/2019)    Facility-Administered Encounter Medications as of 05/01/2019  Medication  . betamethasone acetate-betamethasone sodium phosphate (CELESTONE) injection 3 mg      Medical History: Past Medical History:  Diagnosis Date  . Anemia   . Aneurysm ([place])   . Arthritis    RHEUMATOID ARTHRITIS  . Asthma   . Collagen vascular disease ([place])   . COPD (chronic obstructive pulmonary disease) ([place])   . Coronary artery disease   . Diabetes mellitus without complication ([place])   . Edema    FEET/LEGS  . GERD (gastroesophageal reflux disease)   . Gout   . H/O wheezing   . History of hiatal hernia   . Hypertension   . Hypothyroidism   . Neuropathy   . Oxygen deficiency    HS  . Peripheral vascular disease ([place])   . Sarcoidosis of lung ([place])   . Seizures ([place])    WITH BRAIN 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   . Sleep apnea    OXYGEN AT NIGHT 3 L [place]     Vital Signs: BP 118/74 (BP Location: Right Arm, Patient Position: Sitting, Cuff Size: Normal)   Pulse 76   Temp (!) 97.4 F (36.3 C) (Oral)   Resp 16   Ht _0  (1.6 m)   Wt 211 lb (95.7 kg)   SpO2 97%   BMI 37.38 kg/m    Review of Systems  Constitutional: Negative for chills, fatigue and unexpected weight change.  HENT: Negative for congestion, rhinorrhea, sneezing and sore throat.   Eyes: Negative for photophobia, pain and redness.  Respiratory: Negative for cough, chest tightness and shortness of breath.   Cardiovascular: Negative for chest pain and palpitations.  Gastrointestinal: Positive for abdominal pain. Negative for constipation, diarrhea, nausea and vomiting.  Endocrine: Negative.   Genitourinary: Negative for dysuria and frequency.  Musculoskeletal: Negative for arthralgias, back pain, joint swelling and neck pain.  Skin: Negative for rash.  Allergic/Immunologic: Negative.   Neurological: Negative for tremors and numbness.  Hematological: Negative for  adenopathy. Does not bruise/bleed easily.  Psychiatric/Behavioral: Negative for behavioral problems and sleep disturbance. The patient is not nervous/anxious.     Physical Exam Vitals signs and nursing note reviewed.  Constitutional:      General: She is not in acute distress.    Appearance: She is well-developed. She is not diaphoretic.  HENT:     Head: Normocephalic and atraumatic.     Mouth/Throat:     Pharynx: No oropharyngeal exudate.  Eyes:     Pupils: Pupils are equal, round, and reactive to light.  Neck:     Musculoskeletal: Normal range of motion and neck supple.     Thyroid: No thyromegaly.  Vascular: No JVD.     Trachea: No tracheal deviation.  Cardiovascular:     Rate and Rhythm: Normal rate and regular rhythm.     Heart sounds: Normal heart sounds. No murmur. No friction rub. No gallop.   Pulmonary:     Effort: Pulmonary effort is normal. No respiratory distress.     Breath sounds: Normal breath sounds. No wheezing or rales.  Chest:     Chest wall: No tenderness.  Abdominal:     Palpations: Abdomen is soft.     Tenderness: There is abdominal tenderness. There is no guarding.     Comments: Right lower quadrant tenderness.  Musculoskeletal: Normal range of motion.  Lymphadenopathy:     Cervical: No cervical adenopathy.  Skin:    General: Skin is warm and dry.  Neurological:     Mental Status: She is alert and oriented to person, place, and time.     Cranial Nerves: No cranial nerve deficit.  Psychiatric:        Behavior: Behavior normal.        Thought Content: Thought content normal.        Judgment: Judgment normal.    Assessment/Plan: 1. RLQ abdominal pain PT has continued, worsening abdominal pain.  UTI has resolved. Has history of surgery to abd.  Will get CT to rule out adhesions, or other pathology.  - CT Abdomen Pelvis W Contrast; Future  2. Essential hypertension, benign Stable, continue present management.  3. Morbid obesity ([place]) Obesity  Counseling: Risk Assessment: An assessment of behavioral risk factors was made today and includes lack of exercise sedentary lifestyle, lack of portion control and poor dietary habits.  Risk Modification Advice: She was counseled on portion control guidelines. Restricting daily caloric intake to. . The detrimental long term effects of obesity on her health and ongoing poor compliance was also discussed with the patient.  4. Dysuria Urine clear, except for glucose. Infection has resolved.  - POCT Urinalysis Dipstick  General Counseling: Chrissie verbalizes understanding of the findings of todays visit and agrees with plan of treatment. I have discussed any further diagnostic evaluation that may be needed or ordered today. We also reviewed her medications today. she has been encouraged to call the office with any questions or concerns that should arise related to todays visit.   Orders Placed This Encounter  Procedures  . POCT Urinalysis Dipstick    No orders of the defined types were placed in this encounter.   Time spent: 11 Minutes  This patient was seen by Orson Gear AGNP-C in Collaboration with Dr Lavera Guise as a part of collaborative care agreement.  Kendell Bane AGNP-C Internal Medicine

## 2019-05-10 ENCOUNTER — Telehealth: Payer: Self-pay | Admitting: Adult Health

## 2019-05-10 ENCOUNTER — Ambulatory Visit: Admission: RE | Admit: 2019-05-10 | Payer: Medicare Other | Source: Ambulatory Visit

## 2019-05-10 ENCOUNTER — Other Ambulatory Visit: Payer: Self-pay

## 2019-05-10 NOTE — Telephone Encounter (Signed)
Call patient in attempt to have her go back and have her ct scan, pt left radiology appt , when talking to her she was very upset and said she was not going back .

## 2019-05-12 ENCOUNTER — Other Ambulatory Visit: Payer: Self-pay

## 2019-05-12 MED ORDER — FARXIGA 10 MG PO TABS: 10.0000 mg | ORAL_TABLET | Freq: Every day | ORAL | 2 refills | Status: DC

## 2019-05-29 ENCOUNTER — Ambulatory Visit (INDEPENDENT_AMBULATORY_CARE_PROVIDER_SITE_OTHER): Payer: Medicare Other | Admitting: Adult Health

## 2019-05-29 ENCOUNTER — Other Ambulatory Visit: Payer: Self-pay

## 2019-05-29 ENCOUNTER — Encounter: Payer: Self-pay | Admitting: Adult Health

## 2019-05-29 VITALS — BP 128/68 | HR 73 | Resp 16 | Ht 65.0 in | Wt 212.0 lb

## 2019-05-29 DIAGNOSIS — I10 Essential (primary) hypertension: Secondary | ICD-10-CM

## 2019-05-29 DIAGNOSIS — E1165 Type 2 diabetes mellitus with hyperglycemia: Secondary | ICD-10-CM | POA: Diagnosis not present

## 2019-05-29 DIAGNOSIS — E782 Mixed hyperlipidemia: Secondary | ICD-10-CM | POA: Diagnosis not present

## 2019-05-29 LAB — POCT GLYCOSYLATED HEMOGLOBIN (HGB A1C): Hemoglobin A1C: 11 % — AB (ref 4.0–5.6)

## 2019-05-29 LAB — GLUCOSE, POCT (MANUAL RESULT ENTRY): POC Glucose: 170 mg/dl — AB (ref 70–99)

## 2019-05-29 NOTE — Progress Notes (Signed)
[facility] [place], [place] [address]  Internal MEDICINE  Office Visit Note  Patient Name: Chelsey [name]  [id number]  [id number]  Date of Service: 05/29/2019  Chief Complaint  Patient presents with  . Medical Management of Chronic Issues    never completed ct .  Marland Kitchen Diabetes  . Hyperlipidemia  . Gastroesophageal Reflux  . Quality Metric Gaps    colonoscopy     HPI  Pt is here for follow up on DM, Gerd and HLD . Patients HLD and GERD as well as HTN seems to be controlled at this time.  She does however have an A1C that is 11.0  Her medications have been adjusted multiple times, with no improvement.  She reports she has been taking her medications without difficulty.  She will need a referral to endocrinology at this time.     Current Medication: Outpatient Encounter Medications as of 05/29/2019  Medication Sig Note  . albuterol (PROAIR HFA) 108 (90 Base) MCG/ACT inhaler Inhale 2 puffs into the lungs every 6 (six) hours as needed for wheezing or shortness of breath.   . allopurinol (ZYLOPRIM) 300 MG tablet Take 300 mg by mouth daily.    Marland Kitchen amLODipine (NORVASC) 10 MG tablet TAKE 1 TABLET BY MOUTH DAILY   . aspirin EC 81 MG tablet Take 81 mg by mouth daily.   . Blood Glucose Monitoring Suppl (ACCU-CHEK AVIVA PLUS) W/DEVICE KIT Use as directed. 12/20/2013: Received from: External Pharmacy  . COLCRYS 0.6 MG tablet Take 0.6 mg by mouth 2 (two) times daily as needed (for gout flares.).  09/20/2013: Received from: External Pharmacy  . dapagliflozin propanediol (FARXIGA) 10 MG TABS tablet Take 10 mg by mouth daily. Take 1 tablet daily   . doxycycline (VIBRA-TABS) 100 MG tablet Take 1 tablet by mouth twice daily.   . Dulaglutide (TRULICITY) 1.5 FE/0.7HQ SOPN Inject 1.5 mg into the skin once a week.   . ferrous sulfate 325 (65 FE) MG tablet Take 325 mg by mouth daily with breakfast.   . fluticasone (FLONASE) 50 MCG/ACT nasal spray Frequency:QD   Dosage:50   MCG   Instructions:  Note:2 sprays each nostril daily Dose: 50MCG 08/21/2016: Received from: [facility]  . Fluticasone-Salmeterol (ADVAIR) 250-50 MCG/DOSE AEPB Inhale 1 puff into the lungs 2 (two) times daily.   Marland Kitchen gabapentin (NEURONTIN) 300 MG capsule Take 300 mg by mouth 3 (three) times daily.   Marland Kitchen glucose blood test strip 1 each daily. Use 1 test strip daily. 09/20/2013: Received from: External Pharmacy  . hydrALAZINE (APRESOLINE) 10 MG tablet Take 1 tablet (10 mg total) by mouth 3 (three) times daily.   . hydroxychloroquine (PLAQUENIL) 200 MG tablet TAKE 1 TABLET BY MOUTH TWICE A DAY   . insulin lispro (HUMALOG) 100 UNIT/ML KwikPen INJECT 10 UNITS INTO THE SKIN WITH MEAL IF SUGAR IS HIGH BY SLIDING SCALE (MAX 65 UNITS PER DAY) (Patient taking differently: INJECT 20 UNITS INTO THE SKIN WITH MEAL IF SUGAR IS HIGH BY SLIDING SCALE (MAX 65 UNITS PER DAY))   . Insulin Pen Needle (FIFTY50 PEN NEEDLES) 31G X 8 MM MISC USE THREE TIMES A DAY   . LANCETS ULTRA THIN MISC Apply 1 each topically daily.  09/20/2013: Received from: External Pharmacy  . LANTUS SOLOSTAR 100 UNIT/ML Solostar Pen Inject 75 Units into the skin daily. (Patient taking differently: Inject 70 Units into the skin daily. )   . levothyroxine (SYNTHROID, LEVOTHROID) 50 MCG tablet Take one tab in  am on empty stomach for underactive thyroid   . losartan (COZAAR) 50 MG tablet Take 1 tablet (50 mg total) by mouth daily. For htn   . omeprazole (PRILOSEC) 20 MG capsule Take 20 mg by mouth 2 (two) times daily before a meal.  09/20/2013: Received from: External Pharmacy  . OXYGEN Inhale 3 L into the lungs. Nighttime use   . polyethylene glycol (MIRALAX / GLYCOLAX) packet Take 17 g by mouth daily.   . simvastatin (ZOCOR) 20 MG tablet Take 20 mg by mouth daily with lunch.  12/20/2013: Received from: External Pharmacy  . tretinoin (RETIN-A) 0.025 % cream Apply topically.   . triamcinolone cream (KENALOG) 0.1 % Apply twice a day to affected areas of dry itchy  skin until clear   . canagliflozin (INVOKANA) 100 MG TABS tablet Take 1 tablet (100 mg total) by mouth daily before breakfast. (Patient not taking: Reported on 05/01/2019)    Facility-Administered Encounter Medications as of 05/29/2019  Medication  . betamethasone acetate-betamethasone sodium phosphate (CELESTONE) injection 3 mg    Surgical History: Past Surgical History:  Procedure Laterality Date  . BRAIN SURGERY  1990's  . CATARACT EXTRACTION W/PHACO Left 06/11/2016   Procedure: CATARACT EXTRACTION PHACO AND INTRAOCULAR LENS PLACEMENT (IOC);  Surgeon: Birder Robson, MD;  Location: [facility] ORS;  Service: Ophthalmology;  Laterality: Left;  [place] 1.01 AP% 21.6 CDE 13.23 Fluid pack lot # [id number]  . CEREBRAL ANEURYSM REPAIR  1990's   COILS  . CORONARY ARTERY BYPASS GRAFT    . EYE SURGERY  2000's   bilateral cataract  . FRACTURE SURGERY    . HERNIA REPAIR  2000   ventral  . STENT PLACEMENT VASCULAR ([place] [facility])    . VEIN BYPASS SURGERY    . VENTRAL HERNIA REPAIR N/A 04/29/2016   Procedure: Laparoscopic HERNIA REPAIR VENTRAL ADULT;  Surgeon: Jules Husbands, MD;  Location: [facility] ORS;  Service: General;  Laterality: N/A;    Medical History: Past Medical History:  Diagnosis Date  . Anemia   . Aneurysm ([place])   . Arthritis    RHEUMATOID ARTHRITIS  . Asthma   . Collagen vascular disease ([place])   . COPD (chronic obstructive pulmonary disease) ([place])   . Coronary artery disease   . Diabetes mellitus without complication ([place])   . Edema    FEET/LEGS  . GERD (gastroesophageal reflux disease)   . Gout   . H/O wheezing   . History of hiatal hernia   . Hypertension   . Hypothyroidism   . Neuropathy   . Oxygen deficiency    HS  . Peripheral vascular disease ([place])   . Sarcoidosis of lung ([place])   . Seizures ([place])    WITH BRAIN 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   . Sleep apnea    OXYGEN AT NIGHT 3 L [place]    Family History: Family History  Problem Relation Age of Onset  . Heart disease Mother    . Hypertension Mother   . Diabetes Mother   . Diabetes Father   . Heart disease Father   . Hypertension Father     Social History   Socioeconomic History  . Marital status: Single    Spouse name: Not on file  . Number of children: Not on file  . Years of education: Not on file  . Highest education level: Not on file  Occupational History  . Not on file  Social Needs  . Financial resource strain: Not on file  . Food insecurity  Worry: Not on file    Inability: Not on file  . Transportation needs    Medical: Not on file    Non-medical: Not on file  Tobacco Use  . Smoking status: Former Smoker    Packs/day: 1.00    Years: 30.00    Pack years: 30.00    Types: Cigarettes    Quit date: 01/27/2006    Years since quitting: 13.3  . Smokeless tobacco: Never Used  . Tobacco comment: quit   Substance and Sexual Activity  . Alcohol use: No    Alcohol/week: 0.0 standard drinks  . Drug use: No  . Sexual activity: Not on file  Lifestyle  . Physical activity    Days per week: Not on file    Minutes per session: Not on file  . Stress: Not on file  Relationships  . Social Herbalist on phone: Not on file    Gets together: Not on file    Attends religious service: Not on file    Active member of club or organization: Not on file    Attends meetings of clubs or organizations: Not on file    Relationship status: Not on file  . Intimate partner violence    Fear of current or ex partner: Not on file    Emotionally abused: Not on file    Physically abused: Not on file    Forced sexual activity: Not on file  Other Topics Concern  . Not on file  Social History Narrative  . Not on file      Review of Systems  Constitutional: Negative for chills, fatigue and unexpected weight change.  HENT: Negative for congestion, rhinorrhea, sneezing and sore throat.   Eyes: Negative for photophobia, pain and redness.  Respiratory: Negative for cough, chest tightness and  shortness of breath.   Cardiovascular: Negative for chest pain and palpitations.  Gastrointestinal: Negative for abdominal pain, constipation, diarrhea, nausea and vomiting.  Endocrine: Negative.   Genitourinary: Negative for dysuria and frequency.  Musculoskeletal: Negative for arthralgias, back pain, joint swelling and neck pain.  Skin: Negative for rash.  Allergic/Immunologic: Negative.   Neurological: Negative for tremors and numbness.  Hematological: Negative for adenopathy. Does not bruise/bleed easily.  Psychiatric/Behavioral: Negative for behavioral problems and sleep disturbance. The patient is not nervous/anxious.     Vital Signs: BP 128/68   Pulse 73   Resp 16   Ht 5' 5"  (1.651 m)   Wt 212 lb (96.2 kg)   SpO2 96%   BMI 35.28 kg/m    Physical Exam Vitals signs and nursing note reviewed.  Constitutional:      General: She is not in acute distress.    Appearance: She is well-developed. She is not diaphoretic.  HENT:     Head: Normocephalic and atraumatic.     Mouth/Throat:     Pharynx: No oropharyngeal exudate.  Eyes:     Pupils: Pupils are equal, round, and reactive to light.  Neck:     Musculoskeletal: Normal range of motion and neck supple.     Thyroid: No thyromegaly.     Vascular: No JVD.     Trachea: No tracheal deviation.  Cardiovascular:     Rate and Rhythm: Normal rate and regular rhythm.     Heart sounds: Normal heart sounds. No murmur. No friction rub. No gallop.   Pulmonary:     Effort: Pulmonary effort is normal. No respiratory distress.     Breath sounds: Normal breath  sounds. No wheezing or rales.  Chest:     Chest wall: No tenderness.  Abdominal:     Palpations: Abdomen is soft.     Tenderness: There is no abdominal tenderness. There is no guarding.  Musculoskeletal: Normal range of motion.  Lymphadenopathy:     Cervical: No cervical adenopathy.  Skin:    General: Skin is warm and dry.  Neurological:     Mental Status: She is alert and  oriented to person, place, and time.     Cranial Nerves: No cranial nerve deficit.  Psychiatric:        Behavior: Behavior normal.        Thought Content: Thought content normal.        Judgment: Judgment normal.     Assessment/Plan: 1. Uncontrolled type 2 diabetes mellitus with hyperglycemia (HCC) A1C is elevated at 11 today.  Continue current medications, we once again discussed dietary management.  She is referred to endocrinology at this time for diabetes medication management.  - POCT HgB A1C - POCT Glucose (CBG) - Ambulatory referral to Endocrinology  2. Essential hypertension, benign Stable, continue present management.  3. Mixed hyperlipidemia Continue simvastatin, and will redraw lipid panel at next blood draw  4. Morbid obesity (HCC) Obesity Counseling: Risk Assessment: An assessment of behavioral risk factors was made today and includes lack of exercise sedentary lifestyle, lack of portion control and poor dietary habits.  Risk Modification Advice: She was counseled on portion control guidelines. Restricting daily caloric intake to. . The detrimental long term effects of obesity on her health and ongoing poor compliance was also discussed with the patient.    General Counseling: Trichelle verbalizes understanding of the findings of todays visit and agrees with plan of treatment. I have discussed any further diagnostic evaluation that may be needed or ordered today. We also reviewed her medications today. she has been encouraged to call the office with any questions or concerns that should arise related to todays visit.    Orders Placed This Encounter  Procedures  . POCT HgB A1C  . POCT Glucose (CBG)    No orders of the defined types were placed in this encounter.   Time spent: 25 Minutes   This patient was seen by Orson Gear AGNP-C in Collaboration with Dr Lavera Guise as a part of collaborative care agreement     Kendell Bane AGNP-C Internal medicine

## 2019-06-01 ENCOUNTER — Other Ambulatory Visit: Payer: Self-pay | Admitting: Adult Health

## 2019-06-01 DIAGNOSIS — E1165 Type 2 diabetes mellitus with hyperglycemia: Secondary | ICD-10-CM

## 2019-06-08 ENCOUNTER — Other Ambulatory Visit: Payer: Self-pay

## 2019-06-08 ENCOUNTER — Encounter: Payer: Self-pay | Admitting: Podiatry

## 2019-06-08 ENCOUNTER — Ambulatory Visit (INDEPENDENT_AMBULATORY_CARE_PROVIDER_SITE_OTHER): Payer: Medicare Other | Admitting: Podiatry

## 2019-06-08 VITALS — Temp 97.5°F

## 2019-06-08 DIAGNOSIS — E1159 Type 2 diabetes mellitus with other circulatory complications: Secondary | ICD-10-CM

## 2019-06-08 DIAGNOSIS — M79676 Pain in unspecified toe(s): Secondary | ICD-10-CM

## 2019-06-08 DIAGNOSIS — B351 Tinea unguium: Secondary | ICD-10-CM | POA: Diagnosis not present

## 2019-06-08 NOTE — Progress Notes (Signed)
Complaint:  Visit Type: Patient returns to my office for continued preventative foot care services. Complaint: Patient states" my nails have grown long and thick and become painful to walk and wear shoes" Patient has been diagnosed with DM with  angiopathy.  The patient presents for preventative foot care services. No changes to ROS.    Podiatric Exam: Vascular: dorsalis pedis and posterior tibial pulses are barely palpable bilateral. Capillary return is immediate. Temperature gradient is WNL. Skin turgor WNL  Sensorium: Normal Semmes Weinstein monofilament test. Normal tactile sensation bilaterally. Nail Exam: Pt has thick disfigured discolored nails with subungual debris noted bilateral entire nail hallux through fifth toenails Ulcer Exam: There is no evidence of ulcer or pre-ulcerative changes or infection. Orthopedic Exam: Muscle tone and strength are WNL. No limitations in general ROM. No crepitus or effusions noted. Pes planus with HAV  B/L  .DJD STJ left foot. Skin: . No infection or ulcers.  Pinch callus  B/l  Diagnosis:  Onychomycosis, , Pain in right toe, pain in left toes,    Treatment & Plan Procedures and Treatment: Consent by patient was obtained for treatment procedures. The patient understood the discussion of treatment and procedures well. All questions were answered thoroughly reviewed. Debridement of mycotic and hypertrophic toenails, 1 through 5 bilateral and clearing of subungual debris. No ulceration, no infection noted. Return Visit-Office Procedure: Patient instructed to return to the office for a follow up visit 3 months for continued evaluation and treatment.    Gardiner Barefoot DPM

## 2019-06-20 ENCOUNTER — Other Ambulatory Visit: Payer: Self-pay | Admitting: Adult Health

## 2019-06-28 ENCOUNTER — Other Ambulatory Visit: Payer: Self-pay | Admitting: Internal Medicine

## 2019-06-29 ENCOUNTER — Other Ambulatory Visit: Payer: Self-pay | Admitting: Adult Health

## 2019-06-29 DIAGNOSIS — I10 Essential (primary) hypertension: Secondary | ICD-10-CM

## 2019-07-04 ENCOUNTER — Telehealth: Payer: Self-pay | Admitting: Internal Medicine

## 2019-07-04 NOTE — Telephone Encounter (Signed)
INCOMING FAX WITH CHANGES TO MEDICATIONS FROM ENDOCRINOLOGY

## 2019-07-18 ENCOUNTER — Encounter: Payer: Self-pay | Admitting: Nurse Practitioner

## 2019-07-18 ENCOUNTER — Other Ambulatory Visit: Payer: Self-pay

## 2019-07-18 NOTE — Progress Notes (Signed)
ERROR

## 2019-07-19 ENCOUNTER — Ambulatory Visit: Payer: Self-pay | Admitting: Internal Medicine

## 2019-07-20 ENCOUNTER — Ambulatory Visit: Payer: Self-pay | Admitting: Adult Health

## 2019-07-21 ENCOUNTER — Other Ambulatory Visit: Payer: Self-pay

## 2019-07-21 ENCOUNTER — Encounter: Payer: Self-pay | Admitting: Nurse Practitioner

## 2019-07-21 ENCOUNTER — Ambulatory Visit (INDEPENDENT_AMBULATORY_CARE_PROVIDER_SITE_OTHER): Payer: Medicare Other | Admitting: Nurse Practitioner

## 2019-07-21 VITALS — BP 132/73 | HR 68 | Temp 97.5°F | Resp 16 | Ht 65.0 in | Wt 207.0 lb

## 2019-07-21 DIAGNOSIS — N390 Urinary tract infection, site not specified: Secondary | ICD-10-CM

## 2019-07-21 DIAGNOSIS — R300 Dysuria: Secondary | ICD-10-CM

## 2019-07-21 DIAGNOSIS — I10 Essential (primary) hypertension: Secondary | ICD-10-CM | POA: Diagnosis not present

## 2019-07-21 LAB — POCT URINALYSIS DIPSTICK
Bilirubin, UA: NEGATIVE
Glucose, UA: POSITIVE — AB
Ketones, UA: NEGATIVE
Leukocytes, UA: NEGATIVE
Nitrite, UA: NEGATIVE
Protein, UA: POSITIVE — AB
Spec Grav, UA: 1.01 (ref 1.010–1.025)
Urobilinogen, UA: 0.2 E.U./dL
pH, UA: 5 (ref 5.0–8.0)

## 2019-07-21 MED ORDER — CIPROFLOXACIN HCL 500 MG PO TABS: 500.0000 mg | ORAL_TABLET | Freq: Two times a day (BID) | ORAL | 0 refills | Status: DC

## 2019-07-21 NOTE — Progress Notes (Signed)
[facility] [place], [place] [address]  Internal MEDICINE  Office Visit Note  Patient Name: Chelsey [name]  [id number]  [id number]  Date of Service: 08/02/2019   Pt is here for a sick visit.  Chief Complaint  Patient presents with  . Groin Pain    right side noticed last week      The patient is here for sick visit. States that she has right groin pain, mostly down very low in her groin. Has been going on for several days, gradually getting worse. She states that she is having urinary frequency, but no pain when she urinates. She denies nausea, vomiting, or diarrhea. She denies fever.        Current Medication:  Outpatient Encounter Medications as of 07/21/2019  Medication Sig Note  . albuterol (PROAIR HFA) 108 (90 Base) MCG/ACT inhaler Inhale 2 puffs into the lungs every 6 (six) hours as needed for wheezing or shortness of breath.   . allopurinol (ZYLOPRIM) 300 MG tablet Take 300 mg by mouth daily.    Marland Kitchen amLODipine (NORVASC) 10 MG tablet TAKE 1 TABLET BY MOUTH DAILY   . aspirin EC 81 MG tablet Take 81 mg by mouth daily.   . Blood Glucose Monitoring Suppl (ACCU-CHEK AVIVA PLUS) W/DEVICE KIT Use as directed. 12/20/2013: Received from: External Pharmacy  . colchicine 0.6 MG tablet TAKE 1 TO 2 TABLETS BY MOUTH DAILY AS NEEDED FOR GOUT FLARE **ONLY TAKE WITH GOUT FLARE**   . dapagliflozin propanediol (FARXIGA) 10 MG TABS tablet Take 10 mg by mouth daily. Take 1 tablet daily   . Dulaglutide (TRULICITY) 1.5 KV/4.2VZ SOPN Inject 1.5 mg into the skin once a week.   . ferrous sulfate 325 (65 FE) MG tablet Take 325 mg by mouth daily with breakfast.   . fluticasone (FLONASE) 50 MCG/ACT nasal spray Frequency:QD   Dosage:50   MCG  Instructions:  Note:2 sprays each nostril daily Dose: 50MCG 08/21/2016: Received from: [facility]  . Fluticasone-Salmeterol (ADVAIR) 250-50 MCG/DOSE AEPB Inhale 1 puff into the lungs 2 (two) times daily.   Marland Kitchen gabapentin (NEURONTIN) 300  MG capsule Take 300 mg by mouth 3 (three) times daily.   Marland Kitchen glucose blood test strip 1 each daily. Use 1 test strip daily. 09/20/2013: Received from: External Pharmacy  . hydrALAZINE (APRESOLINE) 10 MG tablet TAKE ONE TABLET BY MOUTH THREE TIMES A DAY   . hydroxychloroquine (PLAQUENIL) 200 MG tablet TAKE 1 TABLET BY MOUTH TWICE A DAY   . insulin lispro (HUMALOG) 100 UNIT/ML KwikPen INJECT 10 UNITS INTO THE SKIN WITH MEAL IF SUGAR IS HIGH BY SLIDING SCALE (MAX 65 UNITS PER DAY) (Patient taking differently: Inject 10 Units into the skin 3 (three) times daily. )   . Insulin Pen Needle (FIFTY50 PEN NEEDLES) 31G X 8 MM MISC USE THREE TIMES A DAY   . LANCETS ULTRA THIN MISC Apply 1 each topically daily.  09/20/2013: Received from: External Pharmacy  . LANTUS SOLOSTAR 100 UNIT/ML Solostar Pen INJECT 75 UNITS INTO THE SKIN DAILY (Patient taking differently: Inject into the skin daily. 40 units in the am, and 60 units in evening .)   . levothyroxine (SYNTHROID, LEVOTHROID) 50 MCG tablet Take one tab in am on empty stomach for underactive thyroid   . losartan (COZAAR) 50 MG tablet Take 1 tablet (50 mg total) by mouth daily. For htn   . omeprazole (PRILOSEC) 20 MG capsule Take 20 mg by mouth 2 (two) times daily before a  meal.  09/20/2013: Received from: External Pharmacy  . OXYGEN Inhale 3 L into the lungs. Nighttime use   . polyethylene glycol (MIRALAX / GLYCOLAX) packet Take 17 g by mouth daily.   . simvastatin (ZOCOR) 20 MG tablet Take 20 mg by mouth daily with lunch.  12/20/2013: Received from: External Pharmacy  . tretinoin (RETIN-A) 0.025 % cream Apply topically.   . triamcinolone cream (KENALOG) 0.1 % Apply twice a day to affected areas of dry itchy skin until clear   . ciprofloxacin (CIPRO) 500 MG tablet Take 1 tablet (500 mg total) by mouth 2 (two) times daily.   . [DISCONTINUED] doxycycline (VIBRA-TABS) 100 MG tablet Take 1 tablet by mouth twice daily.    Facility-Administered Encounter Medications as of  07/21/2019  Medication  . betamethasone acetate-betamethasone sodium phosphate (CELESTONE) injection 3 mg      Medical History: Past Medical History:  Diagnosis Date  . Anemia   . Aneurysm ([place])   . Arthritis    RHEUMATOID ARTHRITIS  . Asthma   . Collagen vascular disease ([place])   . COPD (chronic obstructive pulmonary disease) ([place])   . Coronary artery disease   . Diabetes mellitus without complication ([place])   . Edema    FEET/LEGS  . GERD (gastroesophageal reflux disease)   . Gout   . H/O wheezing   . History of hiatal hernia   . Hypertension   . Hypothyroidism   . Neuropathy   . Oxygen deficiency    HS  . Peripheral vascular disease ([place])   . Sarcoidosis of lung ([place])   . Seizures ([place])    WITH BRAIN 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   . Sleep apnea    OXYGEN AT NIGHT 3 L [place]     Today's Vitals   07/21/19 1058  BP: 132/73  Pulse: 68  Resp: 16  Temp: (!) 97.5 F (36.4 C)  Weight: 207 lb (93.9 kg)  Height: _0  (1.651 m)   Body mass index is 34.45 kg/m.  Review of Systems  Constitutional: Positive for fatigue. Negative for chills, fever and unexpected weight change.  HENT: Negative for congestion, postnasal drip, rhinorrhea, sneezing and sore throat.   Respiratory: Negative for cough, chest tightness, shortness of breath and wheezing.   Cardiovascular: Negative for chest pain and palpitations.  Gastrointestinal: Negative for abdominal pain, constipation, diarrhea, nausea and vomiting.  Genitourinary: Positive for frequency and pelvic pain. Negative for dysuria.  Musculoskeletal: Positive for back pain. Negative for arthralgias, joint swelling and neck pain.  Skin: Negative for rash.  Neurological: Positive for headaches. Negative for tremors and numbness.  Hematological: Negative for adenopathy. Does not bruise/bleed easily.  Psychiatric/Behavioral: Negative for behavioral problems (Depression), sleep disturbance and suicidal ideas. The patient is not  nervous/anxious.     Physical Exam Vitals signs and nursing note reviewed.  Constitutional:      General: She is not in acute distress.    Appearance: Normal appearance. She is well-developed. She is not diaphoretic.  HENT:     Head: Normocephalic and atraumatic.     Mouth/Throat:     Pharynx: No oropharyngeal exudate.  Eyes:     Pupils: Pupils are equal, round, and reactive to light.  Neck:     Musculoskeletal: Normal range of motion and neck supple.     Thyroid: No thyromegaly.     Vascular: No JVD.     Trachea: No tracheal deviation.  Cardiovascular:     Rate and Rhythm: Normal rate and regular rhythm.  Heart sounds: Normal heart sounds. No murmur. No friction rub. No gallop.   Pulmonary:     Effort: Pulmonary effort is normal. No respiratory distress.     Breath sounds: Normal breath sounds. No wheezing or rales.  Chest:     Chest wall: No tenderness.  Abdominal:     General: Bowel sounds are normal.     Palpations: Abdomen is soft.     Tenderness: There is abdominal tenderness.     Comments: There is tenderness with palpation of the right lower quadrant and suprapubic areas of the abdomen  Genitourinary:    Comments: Urine sample is positive for glucose and protein.  Musculoskeletal: Normal range of motion.  Lymphadenopathy:     Cervical: No cervical adenopathy.  Skin:    General: Skin is warm and dry.  Neurological:     Mental Status: She is alert and oriented to person, place, and time.     Cranial Nerves: No cranial nerve deficit.  Psychiatric:        Behavior: Behavior normal.        Thought Content: Thought content normal.        Judgment: Judgment normal.   Assessment/Plan: 1. Urinary tract infection without hematuria, site unspecified Will start cipro 532m bid for 5 days. Send urine for culture and sensitivity and adjust antibiotics as indicated.  - CULTURE, URINE COMPREHENSIVE - ciprofloxacin (CIPRO) 500 MG tablet; Take 1 tablet (500 mg total) by  mouth 2 (two) times daily.  Dispense: 10 tablet; Refill: 0  2. Dysuria - POCT Urinalysis Dipstick  3. Essential hypertension, benign Stable. Continue bp medication as prescribed.   General Counseling: Laquasia verbalizes understanding of the findings of todays visit and agrees with plan of treatment. I have discussed any further diagnostic evaluation that may be needed or ordered today. We also reviewed her medications today. she has been encouraged to call the office with any questions or concerns that should arise related to todays visit.    Counseling:  This patient was seen by HLeretha PolFNP Collaboration with Dr FLavera Guiseas a part of collaborative care agreement  Orders Placed This Encounter  Procedures  . CULTURE, URINE COMPREHENSIVE  . POCT Urinalysis Dipstick    Meds ordered this encounter  Medications  . ciprofloxacin (CIPRO) 500 MG tablet    Sig: Take 1 tablet (500 mg total) by mouth 2 (two) times daily.    Dispense:  10 tablet    Refill:  0    Order Specific Question:   Supervising Provider    Answer:   KLavera Guise[[[id number]   Time spent: 25 Minutes

## 2019-07-24 LAB — CULTURE, URINE COMPREHENSIVE

## 2019-08-02 ENCOUNTER — Other Ambulatory Visit: Payer: Self-pay

## 2019-08-02 ENCOUNTER — Encounter: Payer: Self-pay | Admitting: Internal Medicine

## 2019-08-02 ENCOUNTER — Ambulatory Visit (INDEPENDENT_AMBULATORY_CARE_PROVIDER_SITE_OTHER): Payer: Medicare Other | Admitting: Internal Medicine

## 2019-08-02 ENCOUNTER — Other Ambulatory Visit: Payer: Self-pay | Admitting: Adult Health

## 2019-08-02 VITALS — BP 124/68 | HR 67 | Resp 16 | Ht 65.0 in | Wt 207.0 lb

## 2019-08-02 DIAGNOSIS — I10 Essential (primary) hypertension: Secondary | ICD-10-CM | POA: Diagnosis not present

## 2019-08-02 DIAGNOSIS — N390 Urinary tract infection, site not specified: Secondary | ICD-10-CM | POA: Insufficient documentation

## 2019-08-02 DIAGNOSIS — D860 Sarcoidosis of lung: Secondary | ICD-10-CM | POA: Diagnosis not present

## 2019-08-02 DIAGNOSIS — R0602 Shortness of breath: Secondary | ICD-10-CM

## 2019-08-02 DIAGNOSIS — I729 Aneurysm of unspecified site: Secondary | ICD-10-CM

## 2019-08-02 DIAGNOSIS — I152 Hypertension secondary to endocrine disorders: Secondary | ICD-10-CM | POA: Insufficient documentation

## 2019-08-02 DIAGNOSIS — R300 Dysuria: Secondary | ICD-10-CM | POA: Insufficient documentation

## 2019-08-02 NOTE — Progress Notes (Signed)
Pt needs new referral, back to [facility].  They informed patient they would see her, but new referral needs to be placed for insurance.

## 2019-08-02 NOTE — Progress Notes (Signed)
[facility] [place], [place] [address]  Pulmonary Sleep Medicine   Office Visit Note  Patient Name: Chelsey [name] DOB: [birth year] MRN [id number]  Date of Service: 08/02/2019  Complaints/HPI: Pt is here for follow up on Sarcoidosis.  She reports her breathing has been good, and she denies any illness or issues. No hospitalizations.  She reports she has not need her inhaler in some time.  Overall she has no complaints.    ROS  General: (-) fever, (-) chills, (-) night sweats, (-) weakness Skin: (-) rashes, (-) itching,. Eyes: (-) visual changes, (-) redness, (-) itching. Nose and Sinuses: (-) nasal stuffiness or itchiness, (-) postnasal drip, (-) nosebleeds, (-) sinus trouble. Mouth and Throat: (-) sore throat, (-) hoarseness. Neck: (-) swollen glands, (-) enlarged thyroid, (-) neck pain. Respiratory: - cough, (-) bloody sputum, - shortness of breath, - wheezing. Cardiovascular: - ankle swelling, (-) chest pain. Lymphatic: (-) lymph node enlargement. Neurologic: (-) numbness, (-) tingling. Psychiatric: (-) anxiety, (-) depression   Current Medication: Outpatient Encounter Medications as of 08/02/2019  Medication Sig Note  . albuterol (PROAIR HFA) 108 (90 Base) MCG/ACT inhaler Inhale 2 puffs into the lungs every 6 (six) hours as needed for wheezing or shortness of breath.   . allopurinol (ZYLOPRIM) 300 MG tablet Take 300 mg by mouth daily.    Marland Kitchen amLODipine (NORVASC) 10 MG tablet TAKE 1 TABLET BY MOUTH DAILY   . aspirin EC 81 MG tablet Take 81 mg by mouth daily.   . Blood Glucose Monitoring Suppl (ACCU-CHEK AVIVA PLUS) W/DEVICE KIT Use as directed. 12/20/2013: Received from: External Pharmacy  . ciprofloxacin (CIPRO) 500 MG tablet Take 1 tablet (500 mg total) by mouth 2 (two) times daily.   . colchicine 0.6 MG tablet TAKE 1 TO 2 TABLETS BY MOUTH DAILY AS NEEDED FOR GOUT FLARE **ONLY TAKE WITH GOUT FLARE**   . dapagliflozin propanediol (FARXIGA) 10 MG TABS  tablet Take 10 mg by mouth daily. Take 1 tablet daily   . Dulaglutide (TRULICITY) 1.5 UL/8.4TX SOPN Inject 1.5 mg into the skin once a week.   . ferrous sulfate 325 (65 FE) MG tablet Take 325 mg by mouth daily with breakfast.   . fluticasone (FLONASE) 50 MCG/ACT nasal spray Frequency:QD   Dosage:50   MCG  Instructions:  Note:2 sprays each nostril daily Dose: 50MCG 08/21/2016: Received from: [facility]  . Fluticasone-Salmeterol (ADVAIR) 250-50 MCG/DOSE AEPB Inhale 1 puff into the lungs 2 (two) times daily.   Marland Kitchen gabapentin (NEURONTIN) 300 MG capsule Take 300 mg by mouth 3 (three) times daily.   Marland Kitchen glucose blood test strip 1 each daily. Use 1 test strip daily. 09/20/2013: Received from: External Pharmacy  . hydrALAZINE (APRESOLINE) 10 MG tablet TAKE ONE TABLET BY MOUTH THREE TIMES A DAY   . hydroxychloroquine (PLAQUENIL) 200 MG tablet TAKE 1 TABLET BY MOUTH TWICE A DAY   . insulin lispro (HUMALOG) 100 UNIT/ML KwikPen INJECT 10 UNITS INTO THE SKIN WITH MEAL IF SUGAR IS HIGH BY SLIDING SCALE (MAX 65 UNITS PER DAY) (Patient taking differently: Inject 10 Units into the skin 3 (three) times daily. )   . Insulin Pen Needle (FIFTY50 PEN NEEDLES) 31G X 8 MM MISC USE THREE TIMES A DAY   . LANCETS ULTRA THIN MISC Apply 1 each topically daily.  09/20/2013: Received from: External Pharmacy  . LANTUS SOLOSTAR 100 UNIT/ML Solostar Pen INJECT 75 UNITS INTO THE SKIN DAILY (Patient taking differently: Inject into the skin  daily. 40 units in the am, and 60 units in evening .)   . levothyroxine (SYNTHROID, LEVOTHROID) 50 MCG tablet Take one tab in am on empty stomach for underactive thyroid   . losartan (COZAAR) 50 MG tablet Take 1 tablet (50 mg total) by mouth daily. For htn   . omeprazole (PRILOSEC) 20 MG capsule Take 20 mg by mouth 2 (two) times daily before a meal.  09/20/2013: Received from: External Pharmacy  . OXYGEN Inhale 3 L into the lungs. Nighttime use   . polyethylene glycol (MIRALAX / GLYCOLAX) packet Take  17 g by mouth daily.   . simvastatin (ZOCOR) 20 MG tablet Take 20 mg by mouth daily with lunch.  12/20/2013: Received from: External Pharmacy  . tretinoin (RETIN-A) 0.025 % cream Apply topically.   . triamcinolone cream (KENALOG) 0.1 % Apply twice a day to affected areas of dry itchy skin until clear    Facility-Administered Encounter Medications as of 08/02/2019  Medication  . betamethasone acetate-betamethasone sodium phosphate (CELESTONE) injection 3 mg    Surgical History: Past Surgical History:  Procedure Laterality Date  . BRAIN SURGERY  1990's  . CATARACT EXTRACTION W/PHACO Left 06/11/2016   Procedure: CATARACT EXTRACTION PHACO AND INTRAOCULAR LENS PLACEMENT (IOC);  Surgeon: Birder Robson, MD;  Location: [facility] ORS;  Service: Ophthalmology;  Laterality: Left;  [place] 1.01 AP% 21.6 CDE 13.23 Fluid pack lot # [id number]  . CEREBRAL ANEURYSM REPAIR  1990's   COILS  . CORONARY ARTERY BYPASS GRAFT    . EYE SURGERY  2000's   bilateral cataract  . FRACTURE SURGERY    . HERNIA REPAIR  2000   ventral  . STENT PLACEMENT VASCULAR ([place] HX)    . VEIN BYPASS SURGERY    . VENTRAL HERNIA REPAIR N/A 04/29/2016   Procedure: Laparoscopic HERNIA REPAIR VENTRAL ADULT;  Surgeon: Jules Husbands, MD;  Location: [facility] ORS;  Service: General;  Laterality: N/A;    Medical History: Past Medical History:  Diagnosis Date  . Anemia   . Aneurysm ([place])   . Arthritis    RHEUMATOID ARTHRITIS  . Asthma   . Collagen vascular disease ([place])   . COPD (chronic obstructive pulmonary disease) ([place])   . Coronary artery disease   . Diabetes mellitus without complication ([place])   . Edema    FEET/LEGS  . GERD (gastroesophageal reflux disease)   . Gout   . H/O wheezing   . History of hiatal hernia   . Hypertension   . Hypothyroidism   . Neuropathy   . Oxygen deficiency    HS  . Peripheral vascular disease ([place])   . Sarcoidosis of lung ([place])   . Seizures ([place])    WITH BRAIN 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   . Sleep  apnea    OXYGEN AT NIGHT 3 L [place]    Family History: Family History  Problem Relation Age of Onset  . Heart disease Mother   . Hypertension Mother   . Diabetes Mother   . Diabetes Father   . Heart disease Father   . Hypertension Father     Social History: Social History   Socioeconomic History  . Marital status: Single    Spouse name: Not on file  . Number of children: Not on file  . Years of education: Not on file  . Highest education level: Not on file  Occupational History  . Not on file  Social Needs  . Financial resource strain: Not on file  . Food insecurity  Worry: Not on file    Inability: Not on file  . Transportation needs    Medical: Not on file    Non-medical: Not on file  Tobacco Use  . Smoking status: Former Smoker    Packs/day: 1.00    Years: 30.00    Pack years: 30.00    Types: Cigarettes    Quit date: 01/27/2006    Years since quitting: 13.5  . Smokeless tobacco: Never Used  . Tobacco comment: quit   Substance and Sexual Activity  . Alcohol use: No    Alcohol/week: 0.0 standard drinks  . Drug use: No  . Sexual activity: Not on file  Lifestyle  . Physical activity    Days per week: Not on file    Minutes per session: Not on file  . Stress: Not on file  Relationships  . Social Herbalist on phone: Not on file    Gets together: Not on file    Attends religious service: Not on file    Active member of club or organization: Not on file    Attends meetings of clubs or organizations: Not on file    Relationship status: Not on file  . Intimate partner violence    Fear of current or ex partner: Not on file    Emotionally abused: Not on file    Physically abused: Not on file    Forced sexual activity: Not on file  Other Topics Concern  . Not on file  Social History Narrative  . Not on file    Vital Signs: Blood pressure 124/68, pulse 67, resp. rate 16, height 5' 5"  (1.651 m), weight 207 lb (93.9 kg), SpO2 95  %.  Examination: General Appearance: The patient is well-developed, well-nourished, and in no distress. Skin: Gross inspection of skin unremarkable. Head: normocephalic, no gross deformities. Eyes: no gross deformities noted. ENT: ears appear grossly normal no exudates. Neck: Supple. No thyromegaly. No LAD. Respiratory: clear bilaterally. Cardiovascular: Normal S1 and S2 without murmur or rub. Extremities: No cyanosis. pulses are equal. Neurologic: Alert and oriented. No involuntary movements.  LABS: Recent Results (from the past 2160 hour(s))  POCT HgB A1C     Status: Abnormal   Collection Time: 05/29/19  9:07 AM  Result Value Ref Range   Hemoglobin A1C 11.0 (A) 4.0 - 5.6 %   HbA1c POC (<> result, manual entry)     HbA1c, POC (prediabetic range)     HbA1c, POC (controlled diabetic range)    POCT Glucose (CBG)     Status: Abnormal   Collection Time: 05/29/19  9:11 AM  Result Value Ref Range   POC Glucose 170 (A) 70 - 99 mg/dl  POCT Urinalysis Dipstick     Status: Abnormal   Collection Time: 07/21/19 11:52 AM  Result Value Ref Range   Color, UA     Clarity, UA     Glucose, UA Positive (A) Negative    Comment: 1000   Bilirubin, UA NEGATIVE    Ketones, UA NEGATIVE    Spec Grav, UA 1.010 1.010 - 1.025   Blood, UA NEAGTIVE    pH, UA 5.0 5.0 - 8.0   Protein, UA Positive (A) Negative    Comment: TRACE   Urobilinogen, UA 0.2 0.2 or 1.0 E.U./dL   Nitrite, UA NEGATIVE    Leukocytes, UA Negative Negative   Appearance     Odor    CULTURE, URINE COMPREHENSIVE     Status: None   Collection  Time: 07/21/19 11:55 AM   Specimen: Urine   URINE  Result Value Ref Range   Urine Culture, Comprehensive Final report    Organism ID, Bacteria Comment     Comment: Mixed urogenital flora 10,000-25,000 colony forming units per mL     Radiology: No results found.  No results found.  No results found.    Assessment and Plan: Patient Active Problem List   Diagnosis Date Noted  .  Chronic pain of left wrist 03/04/2017  . Chronic pain of left ankle 11/03/2016  . Chronic pain of left knee 11/03/2016  . Right renal mass 11/03/2016  . Hyperlipemia 07/29/2016  . Recurrent ventral hernia   . Ventral hernia without obstruction or gangrene 03/25/2016  . Foot pain 12/10/2015  . Chronic gouty arthropathy without tophi 12/10/2015  . 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 Sarcoidosis of lung ([place]) 12/10/2015  . Sarcoidosis, nodular type 12/10/2015  . Chronic foot pain, left 12/10/2015  . Chronic idiopathic gout of multiple sites 08/05/2015  . Sleep related hypoventilation/hypoxemia in other disease 04/29/2015  . NAFLD (nonalcoholic fatty liver disease) 10/10/2014  . Hypothyroidism due to acquired atrophy of thyroid 07/11/2014  . Type 2 diabetes mellitus with complication ([place]) 16/94/5038  . Primary localized osteoarthrosis, lower leg 10/21/2012  . Sarcoidosis 05/24/2008  . Peripheral vascular disease ([place]) 02/15/2007  . Hypertension 07/03/1999    1. Sarcoidosis of lung ([place]) Well controlled at this time. Continue to use inhaler as needed and follow up in 4-6 months.   2. Essential hypertension, benign Stable, continue present management.   3. SOB (shortness of breath) FEV1 decreased on this measurement to 1.4 which is 71% of pre-predicted value.  - Spirometry with Graph  General Counseling: I have discussed the findings of the evaluation and examination with [facility].  I have also discussed any further diagnostic evaluation thatmay be needed or ordered today. Brit verbalizes understanding of the findings of todays visit. We also reviewed her medications today and discussed drug interactions and side effects including but not limited excessive drowsiness and altered mental states. We also discussed that there is always a risk not just to her but also people around her. she has been encouraged to call the office with any questions or concerns that should arise  related to todays visit.    Time spent: 15 This patient was seen by Orson Gear AGNP-C in Collaboration with Dr. Devona Konig as a part of collaborative care agreement.   I have personally obtained a history, examined the patient, evaluated laboratory and imaging results, formulated the assessment and plan and placed orders.    Allyne Gee, MD [facility] Pulmonary and Critical Care Sleep medicine

## 2019-08-08 ENCOUNTER — Other Ambulatory Visit: Payer: Self-pay

## 2019-08-08 ENCOUNTER — Ambulatory Visit
Admission: RE | Admit: 2019-08-08 | Discharge: 2019-08-08 | Disposition: A | Discharge status: Home / Self Care | Payer: Medicare Other | Source: Ambulatory Visit | Attending: Adult Health | Admitting: Adult Health

## 2019-08-08 DIAGNOSIS — R1031 Right lower quadrant pain: Secondary | ICD-10-CM | POA: Diagnosis present

## 2019-08-08 LAB — POCT I-STAT CREATININE: Creatinine, Ser: 1.2 mg/dL — ABNORMAL HIGH (ref 0.44–1.00)

## 2019-08-08 MED ORDER — IOHEXOL 300 MG/ML  SOLN
100.0000 mL | Freq: Once | INTRAMUSCULAR | Status: AC | PRN
  Administered 2019-08-08: 100 mL via INTRAVENOUS

## 2019-08-16 ENCOUNTER — Ambulatory Visit (INDEPENDENT_AMBULATORY_CARE_PROVIDER_SITE_OTHER): Payer: Medicare Other | Admitting: Adult Health

## 2019-08-16 ENCOUNTER — Other Ambulatory Visit: Payer: Self-pay

## 2019-08-16 ENCOUNTER — Encounter: Payer: Self-pay | Admitting: Adult Health

## 2019-08-16 VITALS — BP 128/68 | HR 69 | Resp 16 | Ht 63.0 in | Wt 207.0 lb

## 2019-08-16 DIAGNOSIS — R1031 Right lower quadrant pain: Secondary | ICD-10-CM | POA: Diagnosis not present

## 2019-08-16 DIAGNOSIS — N393 Stress incontinence (female) (male): Secondary | ICD-10-CM | POA: Diagnosis not present

## 2019-08-16 DIAGNOSIS — K5900 Constipation, unspecified: Secondary | ICD-10-CM | POA: Diagnosis not present

## 2019-08-16 DIAGNOSIS — N390 Urinary tract infection, site not specified: Secondary | ICD-10-CM | POA: Diagnosis not present

## 2019-08-16 DIAGNOSIS — E1165 Type 2 diabetes mellitus with hyperglycemia: Secondary | ICD-10-CM

## 2019-08-16 NOTE — Progress Notes (Addendum)
[facility] [place], [place] [address]  Internal MEDICINE  Office Visit Note  Patient Name: Chelsey [name]  [id number]  [id number]  Date of Service: 08/24/2019  Chief Complaint  Patient presents with  . Medical Management of Chronic Issues    ct follow up, still having discomfort lower abdomen    HPI Patient here for follow-up of abdominal CT scan and symptoms of UTI. Abdominal ultrasound revealed 1.8 cm lesion on right kidney, exam results concerning for proteinaceous/complex renal cyst. Urine culture sent off revealed no growth. Patient still complaining of intermittent right lower quadrant abdominal pain, reports noticing leaking in her underwear but unsure of source of leaking. Denies N/V or diarrhea.  Current Medication: Outpatient Encounter Medications as of 08/16/2019  Medication Sig Note  . albuterol (PROAIR HFA) 108 (90 Base) MCG/ACT inhaler Inhale 2 puffs into the lungs every 6 (six) hours as needed for wheezing or shortness of breath.   . allopurinol (ZYLOPRIM) 300 MG tablet Take 300 mg by mouth daily.    Marland Kitchen amLODipine (NORVASC) 10 MG tablet TAKE 1 TABLET BY MOUTH DAILY   . aspirin EC 81 MG tablet Take 81 mg by mouth daily.   . Blood Glucose Monitoring Suppl (ACCU-CHEK AVIVA PLUS) W/DEVICE KIT Use as directed. 12/20/2013: Received from: External Pharmacy  . ciprofloxacin (CIPRO) 500 MG tablet Take 1 tablet (500 mg total) by mouth 2 (two) times daily.   . colchicine 0.6 MG tablet TAKE 1 TO 2 TABLETS BY MOUTH DAILY AS NEEDED FOR GOUT FLARE **ONLY TAKE WITH GOUT FLARE**   . dapagliflozin propanediol (FARXIGA) 10 MG TABS tablet Take 10 mg by mouth daily. Take 1 tablet daily   . Dulaglutide (TRULICITY) 1.5 BJ/6.2GB SOPN Inject 1.5 mg into the skin once a week.   . ferrous sulfate 325 (65 FE) MG tablet Take 325 mg by mouth daily with breakfast.   . fluticasone (FLONASE) 50 MCG/ACT nasal spray Frequency:QD   Dosage:50   MCG  Instructions:  Note:2 sprays each  nostril daily Dose: 50MCG 08/21/2016: Received from: [facility]  . Fluticasone-Salmeterol (ADVAIR) 250-50 MCG/DOSE AEPB Inhale 1 puff into the lungs 2 (two) times daily.   Marland Kitchen gabapentin (NEURONTIN) 300 MG capsule Take 300 mg by mouth 3 (three) times daily.   Marland Kitchen glucose blood test strip 1 each daily. Use 1 test strip daily. 09/20/2013: Received from: External Pharmacy  . hydrALAZINE (APRESOLINE) 10 MG tablet TAKE ONE TABLET BY MOUTH THREE TIMES A DAY   . hydroxychloroquine (PLAQUENIL) 200 MG tablet TAKE 1 TABLET BY MOUTH TWICE A DAY   . insulin lispro (HUMALOG) 100 UNIT/ML KwikPen INJECT 10 UNITS INTO THE SKIN WITH MEAL IF SUGAR IS HIGH BY SLIDING SCALE (MAX 65 UNITS PER DAY) (Patient taking differently: Inject 10 Units into the skin 3 (three) times daily. )   . Insulin Pen Needle (FIFTY50 PEN NEEDLES) 31G X 8 MM MISC USE THREE TIMES A DAY   . LANCETS ULTRA THIN MISC Apply 1 each topically daily.  09/20/2013: Received from: External Pharmacy  . LANTUS SOLOSTAR 100 UNIT/ML Solostar Pen INJECT 75 UNITS INTO THE SKIN DAILY (Patient taking differently: Inject into the skin daily. 40 units in the am, and 60 units in evening .)   . levothyroxine (SYNTHROID, LEVOTHROID) 50 MCG tablet Take one tab in am on empty stomach for underactive thyroid   . losartan (COZAAR) 50 MG tablet Take 1 tablet (50 mg total) by mouth daily. For htn   .  omeprazole (PRILOSEC) 20 MG capsule Take 20 mg by mouth 2 (two) times daily before a meal.  09/20/2013: Received from: External Pharmacy  . OXYGEN Inhale 3 L into the lungs. Nighttime use   . polyethylene glycol (MIRALAX / GLYCOLAX) packet Take 17 g by mouth daily.   . simvastatin (ZOCOR) 20 MG tablet Take 20 mg by mouth daily with lunch.  12/20/2013: Received from: External Pharmacy  . tretinoin (RETIN-A) 0.025 % cream Apply topically.   . triamcinolone cream (KENALOG) 0.1 % Apply twice a day to affected areas of dry itchy skin until clear    Facility-Administered Encounter  Medications as of 08/16/2019  Medication  . betamethasone acetate-betamethasone sodium phosphate (CELESTONE) injection 3 mg    Surgical History: Past Surgical History:  Procedure Laterality Date  . BRAIN SURGERY  1990's  . CATARACT EXTRACTION W/PHACO Left 06/11/2016   Procedure: CATARACT EXTRACTION PHACO AND INTRAOCULAR LENS PLACEMENT (IOC);  Surgeon: Birder Robson, MD;  Location: [facility] ORS;  Service: Ophthalmology;  Laterality: Left;  [place] 1.01 AP% 21.6 CDE 13.23 Fluid pack lot # [id number]  . CEREBRAL ANEURYSM REPAIR  1990's   COILS  . CORONARY ARTERY BYPASS GRAFT    . EYE SURGERY  2000's   bilateral cataract  . FRACTURE SURGERY    . HERNIA REPAIR  2000   ventral  . STENT PLACEMENT VASCULAR ([place] HX)    . VEIN BYPASS SURGERY    . VENTRAL HERNIA REPAIR N/A 04/29/2016   Procedure: Laparoscopic HERNIA REPAIR VENTRAL ADULT;  Surgeon: Jules Husbands, MD;  Location: [facility] ORS;  Service: General;  Laterality: N/A;    Medical History: Past Medical History:  Diagnosis Date  . Anemia   . Aneurysm ([place])   . Arthritis    RHEUMATOID ARTHRITIS  . Asthma   . Collagen vascular disease ([place])   . COPD (chronic obstructive pulmonary disease) ([place])   . Coronary artery disease   . Diabetes mellitus without complication ([place])   . Edema    FEET/LEGS  . GERD (gastroesophageal reflux disease)   . Gout   . H/O wheezing   . History of hiatal hernia   . Hypertension   . Hypothyroidism   . Neuropathy   . Oxygen deficiency    HS  . Peripheral vascular disease ([place])   . Sarcoidosis of lung ([place])   . Seizures ([place])    WITH BRAIN 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   . Sleep apnea    OXYGEN AT NIGHT 3 L [place]    Family History: Family History  Problem Relation Age of Onset  . Heart disease Mother   . Hypertension Mother   . Diabetes Mother   . Diabetes Father   . Heart disease Father   . Hypertension Father     Social History   Socioeconomic History  . Marital status: Single    Spouse name:  Not on file  . Number of children: Not on file  . Years of education: Not on file  . Highest education level: Not on file  Occupational History  . Not on file  Social Needs  . Financial resource strain: Not on file  . Food insecurity    Worry: Not on file    Inability: Not on file  . Transportation needs    Medical: Not on file    Non-medical: Not on file  Tobacco Use  . Smoking status: Former Smoker    Packs/day: 1.00    Years: 30.00    Pack years: 30.00  Types: Cigarettes    Quit date: 01/27/2006    Years since quitting: 13.5  . Smokeless tobacco: Never Used  . Tobacco comment: quit   Substance and Sexual Activity  . Alcohol use: No    Alcohol/week: 0.0 standard drinks  . Drug use: No  . Sexual activity: Not on file  Lifestyle  . Physical activity    Days per week: Not on file    Minutes per session: Not on file  . Stress: Not on file  Relationships  . Social Herbalist on phone: Not on file    Gets together: Not on file    Attends religious service: Not on file    Active member of club or organization: Not on file    Attends meetings of clubs or organizations: Not on file    Relationship status: Not on file  . Intimate partner violence    Fear of current or ex partner: Not on file    Emotionally abused: Not on file    Physically abused: Not on file    Forced sexual activity: Not on file  Other Topics Concern  . Not on file  Social History Narrative  . Not on file      Review of Systems  Constitutional: Negative for chills, fatigue and unexpected weight change.  HENT: Negative for congestion, rhinorrhea, sneezing and sore throat.   Eyes: Negative for photophobia, pain and redness.  Respiratory: Negative for cough, chest tightness and shortness of breath.   Cardiovascular: Negative for chest pain and palpitations.  Gastrointestinal: Negative for abdominal pain, constipation, diarrhea, nausea and vomiting.  Endocrine: Negative.    Genitourinary: Negative for dysuria and frequency.  Musculoskeletal: Negative for arthralgias, back pain, joint swelling and neck pain.  Skin: Negative for rash.  Allergic/Immunologic: Negative.   Neurological: Negative for tremors and numbness.  Hematological: Negative for adenopathy. Does not bruise/bleed easily.  Psychiatric/Behavioral: Negative for behavioral problems and sleep disturbance. The patient is not nervous/anxious.     Vital Signs: BP 128/68   Pulse 69   Resp 16   Ht _0  (1.6 m)   Wt 207 lb (93.9 kg)   SpO2 96%   BMI 36.67 kg/m    Physical Exam Vitals signs and nursing note reviewed.  Constitutional:      General: She is not in acute distress.    Appearance: She is well-developed. She is not diaphoretic.  HENT:     Head: Normocephalic and atraumatic.     Mouth/Throat:     Pharynx: No oropharyngeal exudate.  Eyes:     Pupils: Pupils are equal, round, and reactive to light.  Neck:     Musculoskeletal: Normal range of motion and neck supple.     Thyroid: No thyromegaly.     Vascular: No JVD.     Trachea: No tracheal deviation.  Cardiovascular:     Rate and Rhythm: Normal rate and regular rhythm.     Heart sounds: Normal heart sounds. No murmur. No friction rub. No gallop.   Pulmonary:     Effort: Pulmonary effort is normal. No respiratory distress.     Breath sounds: Normal breath sounds. No wheezing or rales.  Chest:     Chest wall: No tenderness.  Abdominal:     Palpations: Abdomen is soft.     Tenderness: There is abdominal tenderness. There is no guarding.     Comments: Right lower quadrant tenderness  Musculoskeletal: Normal range of motion.  Lymphadenopathy:  Cervical: No cervical adenopathy.  Skin:    General: Skin is warm and dry.  Neurological:     Mental Status: She is alert and oriented to person, place, and time.     Cranial Nerves: No cranial nerve deficit.  Psychiatric:        Behavior: Behavior normal.        Thought Content:  Thought content normal.        Judgment: Judgment normal.    Assessment/Plan: 1. RLQ abdominal pain Abdominal US revealed 1.8 cm cyst on right kidney. Referral made to nephrology to follow-up on findings as well as slight elevation in creatinine, 1.20, as well as continued right lower quadrant abdominal pain.  2. Urinary tract infection without hematuria, site unspecified Urine culture did not reveal any growth. Patient not complaining of symptoms at this time, continue to monitor.  3. Stress incontinence in female Mild leaking of urine, educated patient to keep underwear as dry as possible to prevent further complications. Educated patient on how to perform Kegel exercises.   4. Constipation, unspecified constipation type Samples of Linzess 111mg given to patient to try as she has been taking MiraLax with no relief. Reports hard stools daily.  5. Uncontrolled type 2 diabetes mellitus with hyperglycemia ([place] Continue on current therapy and continue to follow-up with endocrinologist.   General Counseling: [place] verbalizes understanding of the findings of todays visit and agrees with plan of treatment. I have discussed any further diagnostic evaluation that may be needed or ordered today. We also reviewed her medications today. she has been encouraged to call the office with any questions or concerns that should arise related to todays visit.    Orders Placed This Encounter  Procedures  . Ambulatory referral to Nephrology    No orders of the defined types were placed in this encounter.   Time spent: 25 Minutes   This patient was seen by AOrson GearAGNP-C in Collaboration with Dr FLavera Guiseas a part of collaborative care agreement     AKendell BaneAGNP-C Internal medicine

## 2019-08-24 ENCOUNTER — Other Ambulatory Visit: Payer: Self-pay | Admitting: Adult Health

## 2019-08-24 DIAGNOSIS — E1122 Type 2 diabetes mellitus with diabetic chronic kidney disease: Secondary | ICD-10-CM

## 2019-08-25 ENCOUNTER — Other Ambulatory Visit: Payer: Self-pay

## 2019-08-25 MED ORDER — LINACLOTIDE 145 MCG PO CAPS: 145.0000 ug | ORAL_CAPSULE | Freq: Every day | ORAL | 0 refills | Status: DC

## 2019-09-07 ENCOUNTER — Encounter: Payer: Self-pay | Admitting: Podiatry

## 2019-09-07 ENCOUNTER — Ambulatory Visit (INDEPENDENT_AMBULATORY_CARE_PROVIDER_SITE_OTHER): Payer: Medicare Other | Admitting: Podiatry

## 2019-09-07 ENCOUNTER — Other Ambulatory Visit: Payer: Self-pay

## 2019-09-07 DIAGNOSIS — E1159 Type 2 diabetes mellitus with other circulatory complications: Secondary | ICD-10-CM

## 2019-09-07 DIAGNOSIS — M79676 Pain in unspecified toe(s): Secondary | ICD-10-CM

## 2019-09-07 DIAGNOSIS — B351 Tinea unguium: Secondary | ICD-10-CM

## 2019-09-07 NOTE — Progress Notes (Signed)
Complaint:  Visit Type: Patient returns to my office for continued preventative foot care services. Complaint: Patient states" my nails have grown long and thick and become painful to walk and wear shoes" Patient has been diagnosed with DM with  angiopathy.  The patient presents for preventative foot care services. No changes to ROS.    Podiatric Exam: Vascular: dorsalis pedis and posterior tibial pulses are barely palpable bilateral. Capillary return is immediate. Temperature gradient is WNL. Skin turgor WNL  Sensorium: Normal Semmes Weinstein monofilament test. Normal tactile sensation bilaterally. Nail Exam: Pt has thick disfigured discolored nails with subungual debris noted bilateral entire nail hallux through fifth toenails Ulcer Exam: There is no evidence of ulcer or pre-ulcerative changes or infection. Orthopedic Exam: Muscle tone and strength are WNL. No limitations in general ROM. No crepitus or effusions noted. Pes planus with HAV  B/L  .DJD STJ left foot. Skin: . No infection or ulcers.  Pinch callus  B/l  Diagnosis:  Onychomycosis, , Pain in right toe, pain in left toes,    Treatment & Plan Procedures and Treatment: Consent by patient was obtained for treatment procedures. The patient understood the discussion of treatment and procedures well. All questions were answered thoroughly reviewed. Debridement of mycotic and hypertrophic toenails, 1 through 5 bilateral and clearing of subungual debris. No ulceration, no infection noted. Return Visit-Office Procedure: Patient instructed to return to the office for a follow up visit 3 months for continued evaluation and treatment.    Jacara Benito DPM 

## 2019-09-15 ENCOUNTER — Other Ambulatory Visit: Payer: Self-pay | Admitting: Adult Health

## 2019-09-26 ENCOUNTER — Telehealth: Payer: Self-pay

## 2019-09-26 ENCOUNTER — Other Ambulatory Visit: Payer: Self-pay | Admitting: Neurology

## 2019-09-26 DIAGNOSIS — Z8679 Personal history of other diseases of the circulatory system: Secondary | ICD-10-CM

## 2019-09-26 NOTE — Telephone Encounter (Signed)
Returned patient call regarding a referral and left her a message to call me back. Chelsey [name]

## 2019-10-05 ENCOUNTER — Ambulatory Visit
Admission: RE | Admit: 2019-10-05 | Discharge: 2019-10-05 | Disposition: A | Discharge status: Home / Self Care | Payer: Medicare Other | Source: Ambulatory Visit | Attending: Neurology | Admitting: Neurology

## 2019-10-05 ENCOUNTER — Other Ambulatory Visit: Payer: Self-pay

## 2019-10-05 DIAGNOSIS — Z8679 Personal history of other diseases of the circulatory system: Secondary | ICD-10-CM | POA: Diagnosis present

## 2019-10-05 DIAGNOSIS — Z9889 Other specified postprocedural states: Secondary | ICD-10-CM | POA: Insufficient documentation

## 2019-10-05 LAB — POCT I-STAT CREATININE: Creatinine, Ser: 1.2 mg/dL — ABNORMAL HIGH (ref 0.44–1.00)

## 2019-10-05 MED ORDER — IOHEXOL 350 MG/ML SOLN
75.0000 mL | Freq: Once | INTRAVENOUS | Status: AC | PRN
  Administered 2019-10-05: 75 mL via INTRAVENOUS

## 2019-10-11 ENCOUNTER — Telehealth: Payer: Self-pay

## 2019-10-11 NOTE — Telephone Encounter (Signed)
LMOM TO CONFIRM AND SCREEN FOR 10-17-19 OV. ALSO ADVISED TO BRING ALL PRESCRIPTION BOTTLES TO APPT.

## 2019-10-17 ENCOUNTER — Encounter: Payer: Self-pay | Admitting: Internal Medicine

## 2019-10-17 ENCOUNTER — Other Ambulatory Visit: Payer: Self-pay

## 2019-10-17 ENCOUNTER — Ambulatory Visit (INDEPENDENT_AMBULATORY_CARE_PROVIDER_SITE_OTHER): Payer: Medicare Other | Admitting: Internal Medicine

## 2019-10-17 VITALS — BP 160/80 | HR 62 | Temp 97.2°F | Resp 16 | Ht 63.0 in | Wt 208.0 lb

## 2019-10-17 DIAGNOSIS — D860 Sarcoidosis of lung: Secondary | ICD-10-CM | POA: Diagnosis not present

## 2019-10-17 DIAGNOSIS — I10 Essential (primary) hypertension: Secondary | ICD-10-CM

## 2019-10-17 DIAGNOSIS — G4730 Sleep apnea, unspecified: Secondary | ICD-10-CM | POA: Diagnosis not present

## 2019-10-17 DIAGNOSIS — Z794 Long term (current) use of insulin: Secondary | ICD-10-CM | POA: Diagnosis not present

## 2019-10-17 DIAGNOSIS — E1165 Type 2 diabetes mellitus with hyperglycemia: Secondary | ICD-10-CM | POA: Diagnosis not present

## 2019-10-17 LAB — POCT GLYCOSYLATED HEMOGLOBIN (HGB A1C): Hemoglobin A1C: 8.8 % — AB (ref 4.0–5.6)

## 2019-10-17 LAB — GLUCOSE, POCT (MANUAL RESULT ENTRY): POC Glucose: 281 mg/dl — AB (ref 70–99)

## 2019-10-17 NOTE — Progress Notes (Signed)
Can u check with her pharmacy to see what is she using, get some medical records from Dr De [place]ingshead office as well

## 2019-10-17 NOTE — Progress Notes (Signed)
[facility] [place], [place] [address]  Internal MEDICINE  Office Visit Note  Patient Name: Chelsey [name]  [id number]  [id number]  Date of Service: 10/22/2019  Chief Complaint  Patient presents with  . Diabetes  . Hypertension  . Gastroesophageal Reflux  . Arthritis  . Anemia  . Follow-up    CT SCAN    HPI Pt is a 68 year old with multiple medical problems. She thinks her diabetic numbers are worse since her meds were changed, she is not sure of her medications and did not bring her list either. She has chronic cough due to sarcoid but does not bother her all the time. She has dx of cutaneous sarcoid with minimal lung involvement. She is seeing multiple providers as well. There is a question of OSA but bss has not been scheduled  Pt is here to review her CT angiogram of head/neck as well  1. 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 No recurrent  aneurysm identified. The right anterior cerebral artery is patent.  2. Negative for cerebral aneurysm.  3. Mild stenosis cavernous carotid bilaterally due to  atherosclerotic calcification.  4. No acute intracranial abnormality. Progression of atrophy and  chronic microvascular ischemic changes since 2007. Current Medication: Outpatient Encounter Medications as of 10/17/2019  Medication Sig Note  . albuterol (PROAIR HFA) 108 (90 Base) MCG/ACT inhaler Inhale 2 puffs into the lungs every 6 (six) hours as needed for wheezing or shortness of breath.   . allopurinol (ZYLOPRIM) 300 MG tablet Take 300 mg by mouth daily.    Marland Kitchen amLODipine (NORVASC) 10 MG tablet TAKE 1 TABLET BY MOUTH DAILY   . aspirin EC 81 MG tablet Take 81 mg by mouth daily.   . Blood Glucose Monitoring Suppl (ACCU-CHEK AVIVA PLUS) W/DEVICE KIT Use as directed. 12/20/2013: Received from: External Pharmacy  . ciprofloxacin (CIPRO) 500 MG tablet Take 1 tablet (500 mg total) by mouth 2 (two) times daily.   . colchicine 0.6 MG tablet TAKE 1 TO 2  TABLETS BY MOUTH DAILY AS NEEDED FOR GOUT FLARE **ONLY TAKE WITH GOUT FLARE**   . dapagliflozin propanediol (FARXIGA) 10 MG TABS tablet Take 10 mg by mouth daily. Take 1 tablet daily   . ferrous sulfate 325 (65 FE) MG tablet Take 325 mg by mouth daily with breakfast.   . fluticasone (FLONASE) 50 MCG/ACT nasal spray Frequency:QD   Dosage:50   MCG  Instructions:  Note:2 sprays each nostril daily Dose: 50MCG 08/21/2016: Received from: [facility]  . Fluticasone-Salmeterol (ADVAIR) 250-50 MCG/DOSE AEPB Inhale 1 puff into the lungs 2 (two) times daily.   Marland Kitchen gabapentin (NEURONTIN) 300 MG capsule Take 300 mg by mouth 3 (three) times daily.   Marland Kitchen glucose blood test strip 1 each daily. Use 1 test strip daily. 09/20/2013: Received from: External Pharmacy  . hydrALAZINE (APRESOLINE) 10 MG tablet TAKE ONE TABLET BY MOUTH THREE TIMES A DAY   . hydroxychloroquine (PLAQUENIL) 200 MG tablet TAKE 1 TABLET BY MOUTH TWICE A DAY   . Insulin Pen Needle (FIFTY50 PEN NEEDLES) 31G X 8 MM MISC USE THREE TIMES A DAY   . LANCETS ULTRA THIN MISC Apply 1 each topically daily.  09/20/2013: Received from: External Pharmacy  . levothyroxine (SYNTHROID, LEVOTHROID) 50 MCG tablet Take one tab in am on empty stomach for underactive thyroid   . linaclotide (LINZESS) 145 MCG CAPS capsule Take 1 capsule (145 mcg total) by mouth daily.   Marland Kitchen losartan (  COZAAR) 50 MG tablet Take 1 tablet (50 mg total) by mouth daily. For htn   . omeprazole (PRILOSEC) 20 MG capsule Take 20 mg by mouth 2 (two) times daily before a meal.  09/20/2013: Received from: External Pharmacy  . OXYGEN Inhale 3 L into the lungs. Nighttime use   . polyethylene glycol (MIRALAX / GLYCOLAX) packet Take 17 g by mouth daily.   . simvastatin (ZOCOR) 20 MG tablet Take 20 mg by mouth daily with lunch.  12/20/2013: Received from: External Pharmacy  . tretinoin (RETIN-A) 0.025 % cream Apply topically.   . triamcinolone cream (KENALOG) 0.1 % Apply twice a day to affected areas of dry  itchy skin until clear   . [DISCONTINUED] Dulaglutide (TRULICITY) 1.5 LY/6.5KP SOPN Inject 1.5 mg into the skin once a week.   . [DISCONTINUED] insulin lispro (HUMALOG) 100 UNIT/ML KwikPen INJECT 10 UNITS INTO THE SKIN WITH MEAL IF SUGAR IS HIGH BY SLIDING SCALE (MAX 65 UNITS PER DAY) (Patient taking differently: Inject 10 Units into the skin 3 (three) times daily. )   . [DISCONTINUED] LANTUS SOLOSTAR 100 UNIT/ML Solostar Pen INJECT 75 UNITS INTO THE SKIN DAILY (Patient taking differently: Inject into the skin daily. 40 units in the am, and 60 units in evening .)    Facility-Administered Encounter Medications as of 10/17/2019  Medication  . betamethasone acetate-betamethasone sodium phosphate (CELESTONE) injection 3 mg    Surgical History: Past Surgical History:  Procedure Laterality Date  . BRAIN SURGERY  1990's  . CATARACT EXTRACTION W/PHACO Left 06/11/2016   Procedure: CATARACT EXTRACTION PHACO AND INTRAOCULAR LENS PLACEMENT (IOC);  Surgeon: Birder Robson, MD;  Location: [facility] ORS;  Service: Ophthalmology;  Laterality: Left;  [place] 1.01 AP% 21.6 CDE 13.23 Fluid pack lot # [id number]  . CEREBRAL ANEURYSM REPAIR  1990's   COILS  . CORONARY ARTERY BYPASS GRAFT    . EYE SURGERY  2000's   bilateral cataract  . FRACTURE SURGERY    . HERNIA REPAIR  2000   ventral  . STENT PLACEMENT VASCULAR ([place] [facility])    . VEIN BYPASS SURGERY    . VENTRAL HERNIA REPAIR N/A 04/29/2016   Procedure: Laparoscopic HERNIA REPAIR VENTRAL ADULT;  Surgeon: Jules Husbands, MD;  Location: [facility] ORS;  Service: General;  Laterality: N/A;    Medical History: Past Medical History:  Diagnosis Date  . Anemia   . Aneurysm ([place])   . Arthritis    RHEUMATOID ARTHRITIS  . Asthma   . Collagen vascular disease ([place])   . COPD (chronic obstructive pulmonary disease) ([place])   . Coronary artery disease   . Diabetes mellitus without complication ([place])   . Edema    FEET/LEGS  . GERD (gastroesophageal reflux disease)   . Gout    . H/O wheezing   . History of hiatal hernia   . Hypertension   . Hypothyroidism   . Neuropathy   . Oxygen deficiency    HS  . Peripheral vascular disease ([place])   . Sarcoidosis of lung ([place])   . 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 Sleep apnea    OXYGEN AT NIGHT 3 L [place]    Family History: Family History  Problem Relation Age of Onset  . Heart disease Mother   . Hypertension Mother   . Diabetes Mother   . Diabetes Father   . Heart disease Father   . Hypertension Father     Social History   Socioeconomic History  .  Marital status: Single    Spouse name: Not on file  . Number of children: Not on file  . Years of education: Not on file  . Highest education level: Not on file  Occupational History  . Not on file  Social Needs  . Financial resource strain: Not on file  . Food insecurity    Worry: Not on file    Inability: Not on file  . Transportation needs    Medical: Not on file    Non-medical: Not on file  Tobacco Use  . Smoking status: Former Smoker    Packs/day: 1.00    Years: 30.00    Pack years: 30.00    Types: Cigarettes    Quit date: 01/27/2006    Years since quitting: 13.7  . Smokeless tobacco: Never Used  . Tobacco comment: quit   Substance and Sexual Activity  . Alcohol use: No    Alcohol/week: 0.0 standard drinks  . Drug use: No  . Sexual activity: Not on file  Lifestyle  . Physical activity    Days per week: Not on file    Minutes per session: Not on file  . Stress: Not on file  Relationships  . Social Herbalist on phone: Not on file    Gets together: Not on file    Attends religious service: Not on file    Active member of club or organization: Not on file    Attends meetings of clubs or organizations: Not on file    Relationship status: Not on file  . Intimate partner violence    Fear of current or ex partner: Not on file    Emotionally abused: Not on file    Physically abused: Not on file    Forced  sexual activity: Not on file  Other Topics Concern  . Not on file  Social History Narrative  . Not on file    Review of Systems  Constitutional: Negative for chills, diaphoresis and fatigue.  HENT: Negative for ear pain, postnasal drip and sinus pressure.   Eyes: Negative for photophobia, discharge, redness, itching and visual disturbance.  Respiratory: Negative for cough, shortness of breath and wheezing.   Cardiovascular: Negative for chest pain, palpitations and leg swelling.  Gastrointestinal: Negative for abdominal pain, constipation, diarrhea, nausea and vomiting.  Genitourinary: Negative for dysuria and flank pain.  Musculoskeletal: Negative for arthralgias, back pain, gait problem and neck pain.  Skin: Negative for color change.  Allergic/Immunologic: Negative for environmental allergies and food allergies.  Neurological: Negative for dizziness and headaches.  Hematological: Does not bruise/bleed easily.  Psychiatric/Behavioral: Negative for agitation, behavioral problems (depression) and hallucinations.    Vital Signs: BP (!) 160/80   Pulse 62   Temp (!) 97.2 F (36.2 C)   Resp 16   Ht _0  (1.6 m)   Wt 208 lb (94.3 kg)   SpO2 98%   BMI 36.85 kg/m    Physical Exam Constitutional:      General: She is not in acute distress.    Appearance: She is well-developed. She is not diaphoretic.  HENT:     Head: Normocephalic and atraumatic.     Mouth/Throat:     Pharynx: No oropharyngeal exudate.  Eyes:     Pupils: Pupils are equal, round, and reactive to light.  Neck:     Musculoskeletal: Normal range of motion and neck supple.     Thyroid: No thyromegaly.     Vascular: No JVD.  Trachea: No tracheal deviation.  Cardiovascular:     Rate and Rhythm: Normal rate and regular rhythm.     Heart sounds: Normal heart sounds. No murmur. No friction rub. No gallop.   Pulmonary:     Effort: Pulmonary effort is normal. No respiratory distress.     Breath sounds: No  wheezing or rales.  Chest:     Chest wall: No tenderness.  Abdominal:     General: Bowel sounds are normal.     Palpations: Abdomen is soft.  Musculoskeletal: Normal range of motion.  Lymphadenopathy:     Cervical: No cervical adenopathy.  Skin:    General: Skin is warm and dry.  Neurological:     Mental Status: She is alert and oriented to person, place, and time.     Cranial Nerves: No cranial nerve deficit.  Psychiatric:        Behavior: Behavior normal.        Thought Content: Thought content normal.        Judgment: Judgment normal.    Assessment/Plan: 1. Type 2 diabetes mellitus with hyperglycemia, with long-term current use of insulin (HCC) - Continue all medications, will get updated list of meds from her pharmacy, get notes from other providers as well. - POCT HgB A1C - POCT Glucose (CBG)  2. Essential hypertension, benign - Bp is elevated however will need to get list of medications before adding or changing any meds, pt is seen by multiple providers, need to address that as well  3. Sarcoidosis of lung ([place]) - Stable, continue MDI as before   4. Sleep apnea in adult - Need to look into scheduling one  General Counseling: Tahliyah verbalizes understanding of the findings of todays visit and agrees with plan of treatment. I have discussed any further diagnostic evaluation that may be needed or ordered today. We also reviewed her medications today. she has been encouraged to call the office with any questions or concerns that should arise related to todays visit.   Orders Placed This Encounter  Procedures  . POCT HgB A1C  . POCT Glucose (CBG)     Time spent:25 Minutes  Dr Lavera Guise Internal medicine

## 2019-10-18 DIAGNOSIS — N281 Cyst of kidney, acquired: Secondary | ICD-10-CM | POA: Insufficient documentation

## 2019-10-30 ENCOUNTER — Other Ambulatory Visit: Payer: Self-pay | Admitting: Adult Health

## 2019-11-07 ENCOUNTER — Telehealth: Payer: Self-pay

## 2019-11-07 NOTE — Telephone Encounter (Signed)
Confirmed appointment with patient. klh °

## 2019-11-14 ENCOUNTER — Other Ambulatory Visit: Payer: Self-pay

## 2019-11-14 ENCOUNTER — Ambulatory Visit (INDEPENDENT_AMBULATORY_CARE_PROVIDER_SITE_OTHER): Payer: Medicare Other | Admitting: Adult Health

## 2019-11-14 ENCOUNTER — Encounter: Payer: Self-pay | Admitting: Adult Health

## 2019-11-14 VITALS — BP 129/65 | HR 71 | Temp 97.2°F | Resp 16 | Ht 63.0 in | Wt 205.0 lb

## 2019-11-14 DIAGNOSIS — E039 Hypothyroidism, unspecified: Secondary | ICD-10-CM

## 2019-11-14 DIAGNOSIS — M1A09X0 Idiopathic chronic gout, multiple sites, without tophus (tophi): Secondary | ICD-10-CM

## 2019-11-14 DIAGNOSIS — Z794 Long term (current) use of insulin: Secondary | ICD-10-CM

## 2019-11-14 DIAGNOSIS — I10 Essential (primary) hypertension: Secondary | ICD-10-CM

## 2019-11-14 DIAGNOSIS — D860 Sarcoidosis of lung: Secondary | ICD-10-CM | POA: Diagnosis not present

## 2019-11-14 DIAGNOSIS — E1165 Type 2 diabetes mellitus with hyperglycemia: Secondary | ICD-10-CM

## 2019-11-14 DIAGNOSIS — Z1231 Encounter for screening mammogram for malignant neoplasm of breast: Secondary | ICD-10-CM

## 2019-11-14 DIAGNOSIS — R300 Dysuria: Secondary | ICD-10-CM

## 2019-11-14 DIAGNOSIS — R7989 Other specified abnormal findings of blood chemistry: Secondary | ICD-10-CM | POA: Diagnosis not present

## 2019-11-14 DIAGNOSIS — Z0001 Encounter for general adult medical examination with abnormal findings: Secondary | ICD-10-CM

## 2019-11-14 DIAGNOSIS — E782 Mixed hyperlipidemia: Secondary | ICD-10-CM

## 2019-11-14 NOTE — Progress Notes (Signed)
[facility] [place], [place] [address]  Internal MEDICINE  Office Visit Note  Patient Name: Chelsey [name]  [id number]  [id number]  Date of Service: 11/14/2019  Chief Complaint  Patient presents with  . Annual Exam  . Hypertension  . Diabetes  . Gastroesophageal Reflux     HPI Pt is here for routine health maintenance examination.  Pt is a well appearing 68 yo AA female.  She has a history of HTN, DM, GERD, sarcoidosis, gout CKD,and HLD. Her bp is currently well controlled.  She Denies Chest pain, Shortness of breath, palpitations, headache, or blurred vision. Her DM is improved, most recent A1C is 8.8.  She is having difficulty controlling her sugar, and does not feel like endocrinology is helping her.  She would like a referral to a new endocrinologist.  Her Jerrye Bushy symptoms are managed well at this time.  Her sarcoidosis is stable, although she reports having some flares occasionally. She has not had any issues with gout lately. Her HLD is currently treated with simvastatin.     Current Medication: Outpatient Encounter Medications as of 11/14/2019  Medication Sig Note  . albuterol (PROAIR HFA) 108 (90 Base) MCG/ACT inhaler Inhale 2 puffs into the lungs every 6 (six) hours as needed for wheezing or shortness of breath.   . allopurinol (ZYLOPRIM) 300 MG tablet Take 300 mg by mouth daily.    Marland Kitchen amLODipine (NORVASC) 10 MG tablet TAKE 1 TABLET BY MOUTH DAILY   . aspirin EC 81 MG tablet Take 81 mg by mouth daily.   . Blood Glucose Monitoring Suppl (ACCU-CHEK AVIVA PLUS) W/DEVICE KIT Use as directed. 12/20/2013: Received from: External Pharmacy  . ciprofloxacin (CIPRO) 500 MG tablet Take 1 tablet (500 mg total) by mouth 2 (two) times daily.   . colchicine 0.6 MG tablet TAKE 1 TO 2 TABLETS BY MOUTH DAILY AS NEEDED FOR GOUT FLARE **ONLY TAKE WITH GOUT FLARE**   . dapagliflozin propanediol (FARXIGA) 10 MG TABS tablet Take 10 mg by mouth daily. Take 1 tablet daily   .  ferrous sulfate 325 (65 FE) MG tablet Take 325 mg by mouth daily with breakfast.   . fluticasone (FLONASE) 50 MCG/ACT nasal spray Frequency:QD   Dosage:50   MCG  Instructions:  Note:2 sprays each nostril daily Dose: 50MCG 08/21/2016: Received from: [facility]  . Fluticasone-Salmeterol (ADVAIR) 250-50 MCG/DOSE AEPB Inhale 1 puff into the lungs 2 (two) times daily.   Marland Kitchen gabapentin (NEURONTIN) 300 MG capsule Take 300 mg by mouth 3 (three) times daily.   Marland Kitchen glucose blood test strip 1 each daily. Use 1 test strip daily. 09/20/2013: Received from: External Pharmacy  . hydrALAZINE (APRESOLINE) 10 MG tablet TAKE ONE TABLET BY MOUTH THREE TIMES A DAY   . hydroxychloroquine (PLAQUENIL) 200 MG tablet TAKE 1 TABLET BY MOUTH TWICE A DAY   . Insulin Pen Needle (FIFTY50 PEN NEEDLES) 31G X 8 MM MISC USE THREE TIMES A DAY   . LANCETS ULTRA THIN MISC Apply 1 each topically daily.  09/20/2013: Received from: External Pharmacy  . levothyroxine (SYNTHROID, LEVOTHROID) 50 MCG tablet Take one tab in am on empty stomach for underactive thyroid   . linaclotide (LINZESS) 145 MCG CAPS capsule Take 1 capsule (145 mcg total) by mouth daily.   Marland Kitchen losartan (COZAAR) 50 MG tablet Take 1 tablet (50 mg total) by mouth daily. For htn   . omeprazole (PRILOSEC) 20 MG capsule Take 20 mg by mouth 2 (two) times daily  before a meal.  09/20/2013: Received from: External Pharmacy  . OXYGEN Inhale 3 L into the lungs. Nighttime use   . polyethylene glycol (MIRALAX / GLYCOLAX) packet Take 17 g by mouth daily.   . simvastatin (ZOCOR) 20 MG tablet Take 20 mg by mouth daily with lunch.  12/20/2013: Received from: External Pharmacy  . tretinoin (RETIN-A) 0.025 % cream Apply topically.   . triamcinolone cream (KENALOG) 0.1 % Apply twice a day to affected areas of dry itchy skin until clear   . [DISCONTINUED] colchicine 0.6 MG tablet TAKE 1 TO 2 TABLETS BY MOUTH DAILY AS NEEDED FOR GOUT FLARE **ONLY TAKE WITH GOUT FLARE**    Facility-Administered  Encounter Medications as of 11/14/2019  Medication  . betamethasone acetate-betamethasone sodium phosphate (CELESTONE) injection 3 mg    Surgical History: Past Surgical History:  Procedure Laterality Date  . BRAIN SURGERY  1990's  . CATARACT EXTRACTION W/PHACO Left 06/11/2016   Procedure: CATARACT EXTRACTION PHACO AND INTRAOCULAR LENS PLACEMENT (IOC);  Surgeon: Birder Robson, MD;  Location: [facility] ORS;  Service: Ophthalmology;  Laterality: Left;  [place] 1.01 AP% 21.6 CDE 13.23 Fluid pack lot # [id number]  . CEREBRAL ANEURYSM REPAIR  1990's   COILS  . CORONARY ARTERY BYPASS GRAFT    . EYE SURGERY  2000's   bilateral cataract  . FRACTURE SURGERY    . HERNIA REPAIR  2000   ventral  . STENT PLACEMENT VASCULAR ([place] HX)    . VEIN BYPASS SURGERY    . VENTRAL HERNIA REPAIR N/A 04/29/2016   Procedure: Laparoscopic HERNIA REPAIR VENTRAL ADULT;  Surgeon: Jules Husbands, MD;  Location: [facility] ORS;  Service: General;  Laterality: N/A;    Medical History: Past Medical History:  Diagnosis Date  . Anemia   . Aneurysm ([place])   . Arthritis    RHEUMATOID ARTHRITIS  . Asthma   . Collagen vascular disease ([place])   . COPD (chronic obstructive pulmonary disease) ([place])   . Coronary artery disease   . Diabetes mellitus without complication ([place])   . Edema    FEET/LEGS  . GERD (gastroesophageal reflux disease)   . Gout   . H/O wheezing   . History of hiatal hernia   . Hypertension   . Hypothyroidism   . Neuropathy   . Oxygen deficiency    HS  . Peripheral vascular disease ([place])   . Sarcoidosis of lung ([place])   . Seizures ([place])    WITH BRAIN 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   . Sleep apnea    OXYGEN AT NIGHT 3 L [place]    Family History: Family History  Problem Relation Age of Onset  . Heart disease Mother   . Hypertension Mother   . Diabetes Mother   . Diabetes Father   . Heart disease Father   . Hypertension Father       Review of Systems  Constitutional: Negative for chills, fatigue and  unexpected weight change.  HENT: Negative for congestion, rhinorrhea, sneezing and sore throat.   Eyes: Negative for photophobia, pain and redness.  Respiratory: Negative for cough, chest tightness and shortness of breath.   Cardiovascular: Negative for chest pain and palpitations.  Gastrointestinal: Negative for abdominal pain, constipation, diarrhea, nausea and vomiting.  Endocrine: Negative.   Genitourinary: Negative for dysuria and frequency.  Musculoskeletal: Negative for arthralgias, back pain, joint swelling and neck pain.  Skin: Negative for rash.  Allergic/Immunologic: Negative.   Neurological: Negative for tremors and numbness.  Hematological: Negative for adenopathy. Does not  bruise/bleed easily.  Psychiatric/Behavioral: Negative for behavioral problems and sleep disturbance. The patient is not nervous/anxious.      Vital Signs: BP 129/65   Pulse 71   Temp (!) 97.2 F (36.2 C)   Resp 16   Ht 5' 3"  (1.6 m)   Wt 205 lb (93 kg)   SpO2 96%   BMI 36.31 kg/m    Physical Exam Vitals and nursing note reviewed. Exam conducted with a chaperone present.  Constitutional:      General: She is not in acute distress.    Appearance: She is well-developed. She is not diaphoretic.  HENT:     Head: Normocephalic and atraumatic.     Mouth/Throat:     Pharynx: No oropharyngeal exudate.  Eyes:     Pupils: Pupils are equal, round, and reactive to light.  Neck:     Thyroid: No thyromegaly.     Vascular: No JVD.     Trachea: No tracheal deviation.  Cardiovascular:     Rate and Rhythm: Normal rate and regular rhythm.     Heart sounds: Normal heart sounds. No murmur. No friction rub. No gallop.   Pulmonary:     Effort: Pulmonary effort is normal. No respiratory distress.     Breath sounds: Normal breath sounds. No wheezing or rales.  Chest:     Chest wall: No tenderness.     Breasts:        Right: Normal.        Left: Normal.     Comments: Exam Chaperoned by Corlis Hove  CMA Abdominal:     Palpations: Abdomen is soft.     Tenderness: There is no abdominal tenderness. There is no guarding.  Musculoskeletal:        General: Normal range of motion.     Cervical back: Normal range of motion and neck supple.  Lymphadenopathy:     Cervical: No cervical adenopathy.     Upper Body:     Right upper body: No supraclavicular, axillary or pectoral adenopathy.     Left upper body: No supraclavicular, axillary or pectoral adenopathy.  Skin:    General: Skin is warm and dry.  Neurological:     Mental Status: She is alert and oriented to person, place, and time.     Cranial Nerves: No cranial nerve deficit.  Psychiatric:        Behavior: Behavior normal.        Thought Content: Thought content normal.        Judgment: Judgment normal.      LABS: Recent Results (from the past 2160 hour(s))  I-STAT creatinine     Status: Abnormal   Collection Time: 10/05/19  8:45 AM  Result Value Ref Range   Creatinine, Ser 1.20 (H) 0.44 - 1.00 mg/dL  POCT HgB A1C     Status: Abnormal   Collection Time: 10/17/19  9:33 AM  Result Value Ref Range   Hemoglobin A1C 8.8 (A) 4.0 - 5.6 %   HbA1c POC (<> result, manual entry)     HbA1c, POC (prediabetic range)     HbA1c, POC (controlled diabetic range)    POCT Glucose (CBG)     Status: Abnormal   Collection Time: 10/17/19  9:33 AM  Result Value Ref Range   POC Glucose 281 (A) 70 - 99 mg/dl     Assessment/Plan: 1. Encounter for general adult medical examination with abnormal findings Up to date on PHM - CBC with Differential/Platelet - Lipid Panel  With LDL/HDL Ratio - TSH - T4, free - Comprehensive metabolic panel  2. Essential hypertension, benign Controlled, continue Hydralazine and amlodipine.   3. Type 2 diabetes mellitus with hyperglycemia, with long-term current use of insulin (HCC) Pt is requesting another referral to endocrinology.  We discussed that her A1C had improved with current physician.  She would like  to see someone else.  - Ambulatory referral to Endocrinology  4. Sarcoidosis of lung ([place]) Stable, continue to follow up as appropriate.   5. Elevated serum creatinine Continue to follow up with Dr. Candiss Norse for surveillance.   6. Mixed hyperlipidemia Lipid panel ordered,   7. Morbid obesity (HCC) Obesity Counseling: Risk Assessment: An assessment of behavioral risk factors was made today and includes lack of exercise sedentary lifestyle, lack of portion control and poor dietary habits.  Risk Modification Advice: She was counseled on portion control guidelines. Restricting daily caloric intake to 1800. The detrimental long term effects of obesity on her health and ongoing poor compliance was also discussed with the patient.  8. Hypothyroidism, unspecified type Check Tsh and Free T4, follow up when results are available.  Continue synthroid at this time.   9. Idiopathic chronic gout of multiple sites without tophus Stable, no issues currently.  Continue colchicine as before.   10. Dysuria - UA/M w/rflx Culture, Routine  11. Encounter for screening mammogram for malignant neoplasm of breast - MM DIGITAL SCREENING BILATERAL; Future  General Counseling: Starlynn verbalizes understanding of the findings of todays visit and agrees with plan of treatment. I have discussed any further diagnostic evaluation that may be needed or ordered today. We also reviewed her medications today. she has been encouraged to call the office with any questions or concerns that should arise related to todays visit.   Orders Placed This Encounter  Procedures  . MM 3D SCREEN BREAST BILATERAL  . UA/M w/rflx Culture, Routine  . CBC with Differential/Platelet  . Lipid Panel With LDL/HDL Ratio  . TSH  . T4, free  . Comprehensive metabolic panel  . Ambulatory referral to Endocrinology    No orders of the defined types were placed in this encounter.   Time spent: 30 Minutes   This patient was seen by Orson Gear AGNP-C in Collaboration with Dr Lavera Guise as a part of collaborative care agreement    Kendell Bane AGNP-C Internal Medicine

## 2019-11-15 LAB — UA/M W/RFLX CULTURE, ROUTINE
Bilirubin, UA: NEGATIVE
Ketones, UA: NEGATIVE
Leukocytes,UA: NEGATIVE
Nitrite, UA: NEGATIVE
Protein,UA: NEGATIVE
RBC, UA: NEGATIVE
Specific Gravity, UA: 1.011 (ref 1.005–1.030)
Urobilinogen, Ur: 0.2 mg/dL (ref 0.2–1.0)
pH, UA: 5 (ref 5.0–7.5)

## 2019-11-15 LAB — MICROSCOPIC EXAMINATION
Bacteria, UA: NONE SEEN
Casts: NONE SEEN /lpf
RBC, Urine: NONE SEEN /hpf (ref 0–2)
WBC, UA: NONE SEEN /hpf (ref 0–5)

## 2019-11-20 ENCOUNTER — Ambulatory Visit
Admission: RE | Admit: 2019-11-20 | Discharge: 2019-11-20 | Disposition: A | Discharge status: Home / Self Care | Payer: Medicare Other | Source: Ambulatory Visit | Attending: Adult Health | Admitting: Adult Health

## 2019-11-20 DIAGNOSIS — Z1231 Encounter for screening mammogram for malignant neoplasm of breast: Secondary | ICD-10-CM | POA: Insufficient documentation

## 2019-11-21 ENCOUNTER — Other Ambulatory Visit: Payer: Self-pay | Admitting: Internal Medicine

## 2019-11-21 ENCOUNTER — Telehealth: Payer: Self-pay

## 2019-11-21 LAB — COMPREHENSIVE METABOLIC PANEL
ALT: 35 IU/L — ABNORMAL HIGH (ref 0–32)
AST: 33 IU/L (ref 0–40)
Albumin/Globulin Ratio: 1.3 (ref 1.2–2.2)
Albumin: 4.2 g/dL (ref 3.8–4.8)
Alkaline Phosphatase: 246 IU/L — ABNORMAL HIGH (ref 39–117)
BUN/Creatinine Ratio: 27 (ref 12–28)
BUN: 39 mg/dL — ABNORMAL HIGH (ref 8–27)
Bilirubin Total: 0.7 mg/dL (ref 0.0–1.2)
CO2: 20 mmol/L (ref 20–29)
Calcium: 9.7 mg/dL (ref 8.7–10.3)
Chloride: 98 mmol/L (ref 96–106)
Creatinine, Ser: 1.43 mg/dL — ABNORMAL HIGH (ref 0.57–1.00)
GFR calc Af Amer: 43 mL/min/{1.73_m2} — ABNORMAL LOW (ref >59)
GFR calc non Af Amer: 37 mL/min/{1.73_m2} — ABNORMAL LOW (ref >59)
Globulin, Total: 3.2 g/dL (ref 1.5–4.5)
Glucose: 517 mg/dL (ref 65–99)
Potassium: 4.8 mmol/L (ref 3.5–5.2)
Sodium: 133 mmol/L — ABNORMAL LOW (ref 134–144)
Total Protein: 7.4 g/dL (ref 6.0–8.5)

## 2019-11-21 LAB — CBC WITH DIFFERENTIAL/PLATELET
Basophils Absolute: 0 10*3/uL (ref 0.0–0.2)
Basos: 1 %
EOS (ABSOLUTE): 0.2 10*3/uL (ref 0.0–0.4)
Eos: 5 %
Hematocrit: 42.1 % (ref 34.0–46.6)
Hemoglobin: 14.1 g/dL (ref 11.1–15.9)
Immature Grans (Abs): 0 10*3/uL (ref 0.0–0.1)
Immature Granulocytes: 0 %
Lymphocytes Absolute: 1 10*3/uL (ref 0.7–3.1)
Lymphs: 28 %
MCH: 29.8 pg (ref 26.6–33.0)
MCHC: 33.5 g/dL (ref 31.5–35.7)
MCV: 89 fL (ref 79–97)
Monocytes Absolute: 0.4 10*3/uL (ref 0.1–0.9)
Monocytes: 10 %
Neutrophils Absolute: 2.1 10*3/uL (ref 1.4–7.0)
Neutrophils: 56 %
Platelets: 167 10*3/uL (ref 150–450)
RBC: 4.73 x10E6/uL (ref 3.77–5.28)
RDW: 12.6 % (ref 11.7–15.4)
WBC: 3.8 10*3/uL (ref 3.4–10.8)

## 2019-11-21 LAB — LIPID PANEL WITH LDL/HDL RATIO
Cholesterol, Total: 135 mg/dL (ref 100–199)
HDL: 32 mg/dL — ABNORMAL LOW (ref >39)
LDL Chol Calc (NIH): 64 mg/dL (ref 0–99)
LDL/HDL Ratio: 2 ratio (ref 0.0–3.2)
Triglycerides: 239 mg/dL — ABNORMAL HIGH (ref 0–149)
VLDL Cholesterol Cal: 39 mg/dL (ref 5–40)

## 2019-11-21 LAB — TSH: TSH: 3.63 u[IU]/mL (ref 0.450–4.500)

## 2019-11-21 LAB — T4, FREE: Free T4: 0.94 ng/dL (ref 0.82–1.77)

## 2019-11-21 NOTE — Telephone Encounter (Signed)
There is a new consult for a new endocrinologist already placed from last visit.

## 2019-11-22 ENCOUNTER — Inpatient Hospital Stay
Admission: RE | Admit: 2019-11-22 | Discharge: 2019-11-22 | Disposition: A | Discharge status: Home / Self Care | Payer: Self-pay | Source: Ambulatory Visit | Attending: *Deleted | Admitting: *Deleted

## 2019-11-22 ENCOUNTER — Other Ambulatory Visit: Payer: Self-pay | Admitting: *Deleted

## 2019-11-22 ENCOUNTER — Telehealth: Payer: Self-pay

## 2019-11-22 DIAGNOSIS — Z1231 Encounter for screening mammogram for malignant neoplasm of breast: Secondary | ICD-10-CM

## 2019-11-23 LAB — MICROSCOPIC EXAMINATION
Bacteria, UA: NONE SEEN
Casts: NONE SEEN /lpf
RBC, Urine: NONE SEEN /hpf (ref 0–2)

## 2019-11-23 LAB — UA/M W/RFLX CULTURE, ROUTINE
Bilirubin, UA: NEGATIVE
Ketones, UA: NEGATIVE
Nitrite, UA: NEGATIVE
Protein,UA: NEGATIVE
RBC, UA: NEGATIVE
Specific Gravity, UA: 1.024 (ref 1.005–1.030)
Urobilinogen, Ur: 0.2 mg/dL (ref 0.2–1.0)
pH, UA: 6 (ref 5.0–7.5)

## 2019-11-23 LAB — URINE CULTURE, REFLEX

## 2019-11-23 NOTE — Telephone Encounter (Signed)
Send beth message

## 2019-11-28 ENCOUNTER — Encounter: Payer: Self-pay | Admitting: Internal Medicine

## 2019-11-28 ENCOUNTER — Other Ambulatory Visit: Payer: Self-pay

## 2019-11-28 ENCOUNTER — Ambulatory Visit (INDEPENDENT_AMBULATORY_CARE_PROVIDER_SITE_OTHER): Payer: Medicare Other | Admitting: Internal Medicine

## 2019-11-28 VITALS — BP 160/83 | HR 74 | Temp 97.3°F | Resp 16 | Ht 63.0 in | Wt 211.0 lb

## 2019-11-28 DIAGNOSIS — Z91199 Patient's noncompliance with other medical treatment and regimen due to unspecified reason: Secondary | ICD-10-CM

## 2019-11-28 DIAGNOSIS — Z9119 Patient's noncompliance with other medical treatment and regimen: Secondary | ICD-10-CM

## 2019-11-28 DIAGNOSIS — N186 End stage renal disease: Secondary | ICD-10-CM | POA: Diagnosis not present

## 2019-11-28 DIAGNOSIS — E1122 Type 2 diabetes mellitus with diabetic chronic kidney disease: Secondary | ICD-10-CM | POA: Diagnosis not present

## 2019-11-28 DIAGNOSIS — I10 Essential (primary) hypertension: Secondary | ICD-10-CM

## 2019-11-28 MED ORDER — GLUCOSE BLOOD VI STRP: ORAL_STRIP | 12 refills | Status: DC

## 2019-11-28 NOTE — Progress Notes (Signed)
[facility] [place], [place] [address]  Internal MEDICINE  Office Visit Note  Patient Name: Chelsey [name]  [id number]  [id number]  Date of Service: 11/28/2019  Chief Complaint  Patient presents with  . Diabetes  . Hypertension  . Gastroesophageal Reflux    HPI Pt is here for routine follow up. She was instructed to bring all home medications so a review can be done. Pt is getting refills from multiple providers which is creating confusion for diabetic management. She has refused to follow dietary recommendations by other endocrinologist. Her blood pressure is elevated as well. She wants refill on her medication but was unable to recall any names   Current Medication: Outpatient Encounter Medications as of 11/28/2019  Medication Sig Note  . albuterol (PROAIR HFA) 108 (90 Base) MCG/ACT inhaler Inhale 2 puffs into the lungs every 6 (six) hours as needed for wheezing or shortness of breath.   . allopurinol (ZYLOPRIM) 300 MG tablet Take 300 mg by mouth daily.    Marland Kitchen amLODipine (NORVASC) 10 MG tablet TAKE 1 TABLET BY MOUTH DAILY   . aspirin EC 81 MG tablet Take 81 mg by mouth daily.   . Blood Glucose Monitoring Suppl (ACCU-CHEK AVIVA PLUS) W/DEVICE KIT Use as directed. 12/20/2013: Received from: External Pharmacy  . ciprofloxacin (CIPRO) 500 MG tablet Take 1 tablet (500 mg total) by mouth 2 (two) times daily.   . colchicine 0.6 MG tablet TAKE 1 TO 2 TABLETS BY MOUTH DAILY AS NEEDED FOR GOUT FLARE **ONLY TAKE WITH GOUT FLARE**   . dapagliflozin propanediol (FARXIGA) 10 MG TABS tablet Take 10 mg by mouth daily. Take 1 tablet daily   . ferrous sulfate 325 (65 FE) MG tablet Take 325 mg by mouth daily with breakfast.   . fluticasone (FLONASE) 50 MCG/ACT nasal spray Frequency:QD   Dosage:50   MCG  Instructions:  Note:2 sprays each nostril daily Dose: 50MCG 08/21/2016: [facility]ceived from: [facility]  . Fluticasone-Salmeterol (ADVAIR) 250-50 MCG/DOSE AEPB Inhale 1 puff into the  lungs 2 (two) times daily.   Marland Kitchen gabapentin (NEURONTIN) 300 MG capsule Take 300 mg by mouth 3 (three) times daily.   Marland Kitchen glucose blood test strip Test sugar 4 x aday for uncontrolled dm on insulin E11.22   . hydrALAZINE (APRESOLINE) 10 MG tablet TAKE ONE TABLET BY MOUTH THREE TIMES A DAY   . hydroxychloroquine (PLAQUENIL) 200 MG tablet TAKE 1 TABLET BY MOUTH TWICE A DAY   . Insulin Pen Needle (FIFTY50 PEN NEEDLES) 31G X 8 MM MISC USE THREE TIMES A DAY   . insulin regular human CONCENTRATED (HUMULIN R U-500 KWIKPEN) 500 UNIT/ML kwikpen Inject into the skin.   Marland Kitchen LANCETS ULTRA THIN MISC Apply 1 each topically daily.  09/20/2013: Received from: External Pharmacy  . levothyroxine (SYNTHROID, LEVOTHROID) 50 MCG tablet Take one tab in am on empty stomach for underactive thyroid   . linaclotide (LINZESS) 145 MCG CAPS capsule Take 1 capsule (145 mcg total) by mouth daily.   Marland Kitchen losartan (COZAAR) 50 MG tablet Take 1 tablet (50 mg total) by mouth daily. For htn   . omeprazole (PRILOSEC) 20 MG capsule Take 20 mg by mouth 2 (two) times daily before a meal.  09/20/2013: Received from: External Pharmacy  . OXYGEN Inhale 3 L into the lungs. Nighttime use   . polyethylene glycol (MIRALAX / GLYCOLAX) packet Take 17 g by mouth daily.   . simvastatin (ZOCOR) 20 MG tablet Take 20 mg by mouth daily  with lunch.  12/20/2013: Received from: External Pharmacy  . tretinoin (RETIN-A) 0.025 % cream Apply topically.   . triamcinolone cream (KENALOG) 0.1 % Apply twice a day to affected areas of dry itchy skin until clear   . [DISCONTINUED] glucose blood test strip 1 each daily. Use 1 test strip daily. 09/20/2013: Received from: External Pharmacy   Facility-Administered Encounter Medications as of 11/28/2019  Medication  . betamethasone acetate-betamethasone sodium phosphate (CELESTONE) injection 3 mg    Surgical History: Past Surgical History:  Procedure Laterality Date  . BRAIN SURGERY  1990's  . CATARACT EXTRACTION W/PHACO Left  06/11/2016   Procedure: CATARACT EXTRACTION PHACO AND INTRAOCULAR LENS PLACEMENT (IOC);  Surgeon: Birder Robson, MD;  Location: [facility] ORS;  Service: Ophthalmology;  Laterality: Left;  [place] 1.01 AP% 21.6 CDE 13.23 Fluid pack lot # [id number]  . CEREBRAL ANEURYSM REPAIR  1990's   COILS  . CORONARY ARTERY BYPASS GRAFT    . EYE SURGERY  2000's   bilateral cataract  . FRACTURE SURGERY    . HERNIA REPAIR  2000   ventral  . STENT PLACEMENT VASCULAR ([place] HX)    . VEIN BYPASS SURGERY    . VENTRAL HERNIA REPAIR N/A 04/29/2016   Procedure: Laparoscopic HERNIA REPAIR VENTRAL ADULT;  Surgeon: Jules Husbands, MD;  Location: [facility] ORS;  Service: General;  Laterality: N/A;    Medical History: Past Medical History:  Diagnosis Date  . Anemia   . Aneurysm ([place])   . Arthritis    RHEUMATOID ARTHRITIS  . Asthma   . Collagen vascular disease ([place])   . COPD (chronic obstructive pulmonary disease) ([place])   . Coronary artery disease   . Diabetes mellitus without complication ([place])   . Edema    FEET/LEGS  . GERD (gastroesophageal reflux disease)   . Gout   . H/O wheezing   . History of hiatal hernia   . Hypertension   . Hypothyroidism   . Neuropathy   . Oxygen deficiency    HS  . Peripheral vascular disease ([place])   . Sarcoidosis of lung ([place])   . Seizures ([place])    WITH BRAIN 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   . Sleep apnea    OXYGEN AT NIGHT 3 L [place]    Family History: Family History  Problem Relation Age of Onset  . Heart disease Mother   . Hypertension Mother   . Diabetes Mother   . Diabetes Father   . Heart disease Father   . Hypertension Father   . Breast cancer Maternal Aunt     Social History   Socioeconomic History  . Marital status: Single    Spouse name: Not on file  . Number of children: Not on file  . Years of education: Not on file  . Highest education level: Not on file  Occupational History  . Not on file  Tobacco Use  . Smoking status: Former Smoker    Packs/day: 1.00     Years: 30.00    Pack years: 30.00    Types: Cigarettes    Quit date: 01/27/2006    Years since quitting: 13.8  . Smokeless tobacco: Never Used  . Tobacco comment: quit   Substance and Sexual Activity  . Alcohol use: No    Alcohol/week: 0.0 standard drinks  . Drug use: No  . Sexual activity: Not on file  Other Topics Concern  . Not on file  Social History Narrative  . Not on file   Social Determinants of Health  Financial Resource Strain:   . Difficulty of Paying Living Expenses: Not on file  Food Insecurity:   . Worried About Charity fundraiser in the Last Year: Not on file  . Ran Out of Food in the Last Year: Not on file  Transportation Needs:   . Lack of Transportation (Medical): Not on file  . Lack of Transportation (Non-Medical): Not on file  Physical Activity:   . Days of Exercise per Week: Not on file  . Minutes of Exercise per Session: Not on file  Stress:   . Feeling of Stress : Not on file  Social Connections:   . Frequency of Communication with Friends and Family: Not on file  . Frequency of Social Gatherings with Friends and Family: Not on file  . Attends Religious Services: Not on file  . Active Member of Clubs or Organizations: Not on file  . Attends Archivist Meetings: Not on file  . Marital Status: Not on file  Intimate Partner Violence:   . Fear of Current or Ex-Partner: Not on file  . Emotionally Abused: Not on file  . Physically Abused: Not on file  . Sexually Abused: Not on file    Review of Systems  Constitutional: Negative.   Respiratory: Negative.   Cardiovascular: Negative for chest pain, palpitations and leg swelling.  Gastrointestinal: Negative.   Endocrine: Positive for polydipsia and polyphagia.  Musculoskeletal: Positive for back pain.  Neurological: Negative.   Psychiatric/Behavioral: Positive for agitation and confusion.    Vital Signs: BP (!) 160/83   Pulse 74   Temp (!) 97.3 F (36.3 C)   Resp 16   Ht 5' 3"   (1.6 m)   Wt 211 lb (95.7 kg)   SpO2 96%   BMI 37.38 kg/m    Physical Exam Constitutional:      Appearance: Normal appearance.  Cardiovascular:     Rate and Rhythm: Normal rate and regular rhythm.     Pulses: Normal pulses.     Heart sounds: Normal heart sounds.  Neurological:     General: No focal deficit present.     Mental Status: She is alert.    Assessment/Plan: 1. 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 Current non-adherence to medical treatment - Pt gives h/o of being on Humalog U-500 which can have risk of hypoglycemia, will need to confirm   3. Essential hypertension, benign - Uncontrolled, non adherence with meds   General Counseling: Shifa verbalizes understanding of the findings of todays visit and agrees with plan of treatment. I have discussed any further diagnostic evaluation that may be needed or ordered today. We also reviewed her medications today. she has been encouraged to call the office with any questions or concerns that should arise related to todays visit.  Meds ordered this encounter  Medications  . glucose blood test strip    Sig: Test sugar 4 x aday for uncontrolled dm on insulin E11.22    Dispense:  120 each    Refill:  12   Diabetes Counseling:  1. Addition of ACE inh/ ARB'S for nephroprotection. Microalbumin is updated  2. Diabetic foot care, prevention of complications. Podiatry consult 3. Exercise and lose weight.  4. Diabetic eye examination, Diabetic eye exam is updated  5. Monitor blood sugar closlely. nutrition counseling.  6. Sign and symptoms of hypoglycemia including shaking sweating,confusion and headaches.   Total time spent:30 Minutes Time spent includes review of chart, medications, test results, and  follow up plan with the patient.   Dr Lavera Guise Internal medicine

## 2019-12-01 ENCOUNTER — Telehealth: Payer: Self-pay

## 2019-12-01 ENCOUNTER — Other Ambulatory Visit: Payer: Self-pay | Admitting: Internal Medicine

## 2019-12-01 ENCOUNTER — Other Ambulatory Visit: Payer: Self-pay

## 2019-12-01 DIAGNOSIS — Z91199 Patient's noncompliance with other medical treatment and regimen due to unspecified reason: Secondary | ICD-10-CM

## 2019-12-01 DIAGNOSIS — Z9119 Patient's noncompliance with other medical treatment and regimen: Secondary | ICD-10-CM

## 2019-12-01 MED ORDER — HUMULIN R U-500 KWIKPEN 500 UNIT/ML ~~LOC~~ SOPN: PEN_INJECTOR | SUBCUTANEOUS | 0 refills | Status: DC

## 2019-12-01 NOTE — Telephone Encounter (Signed)
Confirmed 12-05-19 ov as virtual.

## 2019-12-04 ENCOUNTER — Other Ambulatory Visit: Payer: Self-pay | Admitting: Adult Health

## 2019-12-04 DIAGNOSIS — I10 Essential (primary) hypertension: Secondary | ICD-10-CM

## 2019-12-05 ENCOUNTER — Encounter: Payer: Self-pay | Admitting: Internal Medicine

## 2019-12-05 ENCOUNTER — Ambulatory Visit (INDEPENDENT_AMBULATORY_CARE_PROVIDER_SITE_OTHER): Payer: Medicare Other | Admitting: Internal Medicine

## 2019-12-05 ENCOUNTER — Ambulatory Visit: Payer: Medicare Other | Admitting: Adult Health

## 2019-12-05 VITALS — BP 169/83 | Ht 65.0 in | Wt 211.0 lb

## 2019-12-05 DIAGNOSIS — D860 Sarcoidosis of lung: Secondary | ICD-10-CM

## 2019-12-05 DIAGNOSIS — E1165 Type 2 diabetes mellitus with hyperglycemia: Secondary | ICD-10-CM

## 2019-12-05 DIAGNOSIS — E7849 Other hyperlipidemia: Secondary | ICD-10-CM

## 2019-12-05 DIAGNOSIS — I10 Essential (primary) hypertension: Secondary | ICD-10-CM

## 2019-12-05 DIAGNOSIS — Z794 Long term (current) use of insulin: Secondary | ICD-10-CM

## 2019-12-05 MED ORDER — ATORVASTATIN CALCIUM 10 MG PO TABS: 10.0000 mg | ORAL_TABLET | Freq: Every day | ORAL | 1 refills | Status: DC

## 2019-12-05 MED ORDER — TRULICITY 1.5 MG/0.5ML ~~LOC~~ SOAJ: 1.5000 mg | SUBCUTANEOUS | 1 refills | Status: DC

## 2019-12-05 NOTE — Progress Notes (Signed)
[facility] [place], [place] [address]  Internal MEDICINE  Telephone Visit  Patient Name: Chelsey [name]  [id number]  [id number]  Date of Service: 12/05/2019  I connected with the patient at 905 by telephone and verified the patients identity using two identifiers.   I discussed the limitations, risks, security and privacy concerns of performing an evaluation and management service by telephone and the availability of in person appointments. I also discussed with the patient that there may be a patient responsible charge related to the service.  The patient expressed understanding and agrees to proceed.    Chief Complaint  Patient presents with  . Telephone Assessment  . Telephone Screen  . Diabetes    glucose 78  . Hypertension    HPI Pt is connected via virtual visit to address confusion about her diabetic medications. Pt on insulin U -500 by endocrinology. She thinks this medicine works well. Pt is trying to watch er diet. She is on simvastatin however will need modification in her therapy. Denies any chest pain or sob. No fever or chills Current Medication: Outpatient Encounter Medications as of 12/05/2019  Medication Sig Note  . albuterol (PROAIR HFA) 108 (90 Base) MCG/ACT inhaler Inhale 2 puffs into the lungs every 6 (six) hours as needed for wheezing or shortness of breath.   . allopurinol (ZYLOPRIM) 300 MG tablet Take 300 mg by mouth daily.    Marland Kitchen amLODipine (NORVASC) 10 MG tablet TAKE 1 TABLET BY MOUTH DAILY   . aspirin EC 81 MG tablet Take 81 mg by mouth daily.   . Blood Glucose Monitoring Suppl (ACCU-CHEK AVIVA PLUS) W/DEVICE KIT Use as directed. 12/20/2013: Received from: External Pharmacy  . colchicine 0.6 MG tablet TAKE 1 TO 2 TABLETS BY MOUTH DAILY AS NEEDED FOR GOUT FLARE **ONLY TAKE WITH GOUT FLARE**   . fluticasone (FLONASE) 50 MCG/ACT nasal spray Frequency:QD   Dosage:50   MCG  Instructions:  Note:2 sprays each nostril daily Dose: 50MCG  08/21/2016: Received from: [facility]  . Fluticasone-Salmeterol (ADVAIR) 250-50 MCG/DOSE AEPB Inhale 1 puff into the lungs 2 (two) times daily.   Marland Kitchen gabapentin (NEURONTIN) 300 MG capsule Take 300 mg by mouth 3 (three) times daily.   Marland Kitchen glucose blood test strip Test sugar 4 x aday for uncontrolled dm on insulin E11.22   . hydrALAZINE (APRESOLINE) 10 MG tablet TAKE 1 TABLET BY MOUTH 3 TIMES A DAY   . hydroxychloroquine (PLAQUENIL) 200 MG tablet TAKE 1 TABLET BY MOUTH TWICE A DAY   . insulin regular human CONCENTRATED (HUMULIN R U-500 KWIKPEN) 500 UNIT/ML kwikpen Take insulin 60 units three times a day   . LANCETS ULTRA THIN MISC Apply 1 each topically daily.  09/20/2013: Received from: External Pharmacy  . levothyroxine (SYNTHROID, LEVOTHROID) 50 MCG tablet Take one tab in am on empty stomach for underactive thyroid   . linaclotide (LINZESS) 145 MCG CAPS capsule Take 1 capsule (145 mcg total) by mouth daily.   Marland Kitchen losartan (COZAAR) 50 MG tablet Take 1 tablet (50 mg total) by mouth daily. For htn   . omeprazole (PRILOSEC) 20 MG capsule Take 20 mg by mouth 2 (two) times daily before a meal.  09/20/2013: Received from: External Pharmacy  . OXYGEN Inhale 3 L into the lungs. Nighttime use   . polyethylene glycol (MIRALAX / GLYCOLAX) packet Take 17 g by mouth daily.   Marland Kitchen tretinoin (RETIN-A) 0.025 % cream Apply topically.   . triamcinolone cream (KENALOG) 0.1 %  Apply twice a day to affected areas of dry itchy skin until clear   . ULTICARE SHORT PEN NEEDLES 31G X 8 MM MISC USE 3 TIMES A DAY   . [DISCONTINUED] ferrous sulfate 325 (65 FE) MG tablet Take 325 mg by mouth daily with breakfast.   . [DISCONTINUED] simvastatin (ZOCOR) 20 MG tablet Take 20 mg by mouth daily with lunch.  12/20/2013: Received from: External Pharmacy  . atorvastatin (LIPITOR) 10 MG tablet Take 1 tablet (10 mg total) by mouth daily. For cholestrol   . dapagliflozin propanediol (FARXIGA) 10 MG TABS tablet Take 10 mg by mouth daily. Take 1  tablet daily (Patient not taking: Reported on 12/05/2019)   . Dulaglutide (TRULICITY) 1.5 HU/3.1SH SOPN Inject 1.5 mg into the skin once a week.   . [DISCONTINUED] ciprofloxacin (CIPRO) 500 MG tablet Take 1 tablet (500 mg total) by mouth 2 (two) times daily.    Facility-Administered Encounter Medications as of 12/05/2019  Medication  . betamethasone acetate-betamethasone sodium phosphate (CELESTONE) injection 3 mg    Surgical History: Past Surgical History:  Procedure Laterality Date  . BRAIN SURGERY  1990's  . CATARACT EXTRACTION W/PHACO Left 06/11/2016   Procedure: CATARACT EXTRACTION PHACO AND INTRAOCULAR LENS PLACEMENT (IOC);  Surgeon: Birder Robson, MD;  Location: [facility] ORS;  Service: Ophthalmology;  Laterality: Left;  [place] 1.01 AP% 21.6 CDE 13.23 Fluid pack lot # [id number]  . CEREBRAL ANEURYSM REPAIR  1990's   COILS  . CORONARY ARTERY BYPASS GRAFT    . EYE SURGERY  2000's   bilateral cataract  . FRACTURE SURGERY    . HERNIA REPAIR  2000   ventral  . STENT PLACEMENT VASCULAR ([place] HX)    . VEIN BYPASS SURGERY    . VENTRAL HERNIA REPAIR N/A 04/29/2016   Procedure: Laparoscopic HERNIA REPAIR VENTRAL ADULT;  Surgeon: Jules Husbands, MD;  Location: [facility] ORS;  Service: General;  Laterality: N/A;    Medical History: Past Medical History:  Diagnosis Date  . Anemia   . Aneurysm ([place])   . Arthritis    RHEUMATOID ARTHRITIS  . Asthma   . Collagen vascular disease ([place])   . COPD (chronic obstructive pulmonary disease) ([place])   . Coronary artery disease   . Diabetes mellitus without complication ([place])   . Edema    FEET/LEGS  . GERD (gastroesophageal reflux disease)   . Gout   . H/O wheezing   . History of hiatal hernia   . Hypertension   . Hypothyroidism   . Neuropathy   . Oxygen deficiency    HS  . Peripheral vascular disease ([place])   . Sarcoidosis of lung ([place])   . Seizures ([place])    WITH BRAIN 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   . Sleep apnea    OXYGEN AT NIGHT 3 L [place]     Family History: Family History  Problem Relation Age of Onset  . Heart disease Mother   . Hypertension Mother   . Diabetes Mother   . Diabetes Father   . Heart disease Father   . Hypertension Father   . Breast cancer Maternal Aunt     Social History   Socioeconomic History  . Marital status: Single    Spouse name: Not on file  . Number of children: Not on file  . Years of education: Not on file  . Highest education level: Not on file  Occupational History  . Not on file  Tobacco Use  . Smoking status: Former Smoker  Packs/day: 1.00    Years: 30.00    Pack years: 30.00    Types: Cigarettes    Quit date: 01/27/2006    Years since quitting: 13.8  . Smokeless tobacco: Never Used  . Tobacco comment: quit   Substance and Sexual Activity  . Alcohol use: No    Alcohol/week: 0.0 standard drinks  . Drug use: No  . Sexual activity: Not on file  Other Topics Concern  . Not on file  Social History Narrative  . Not on file   Social Determinants of Health   Financial Resource Strain:   . Difficulty of Paying Living Expenses: Not on file  Food Insecurity:   . Worried About Charity fundraiser in the Last Year: Not on file  . Ran Out of Food in the Last Year: Not on file  Transportation Needs:   . Lack of Transportation (Medical): Not on file  . Lack of Transportation (Non-Medical): Not on file  Physical Activity:   . Days of Exercise per Week: Not on file  . Minutes of Exercise per Session: Not on file  Stress:   . Feeling of Stress : Not on file  Social Connections:   . Frequency of Communication with Friends and Family: Not on file  . Frequency of Social Gatherings with Friends and Family: Not on file  . Attends Religious Services: Not on file  . Active Member of Clubs or Organizations: Not on file  . Attends Archivist Meetings: Not on file  . Marital Status: Not on file  Intimate Partner Violence:   . Fear of Current or Ex-Partner: Not on file  .  Emotionally Abused: Not on file  . Physically Abused: Not on file  . Sexually Abused: Not on file   Review of Systems  Constitutional: Negative.  Negative for chills, diaphoresis and fatigue.  HENT: Negative for ear pain, postnasal drip and sinus pressure.   Eyes: Negative for photophobia, discharge, redness, itching and visual disturbance.  Respiratory: Negative for cough, shortness of breath and wheezing.   Cardiovascular: Negative for chest pain, palpitations and leg swelling.  Gastrointestinal: Negative for abdominal pain, constipation, diarrhea, nausea and vomiting.  Genitourinary: Negative for dysuria and flank pain.  Musculoskeletal: Negative for arthralgias, back pain, gait problem and neck pain.  Skin: Negative for color change.  Allergic/Immunologic: Negative for environmental allergies and food allergies.  Neurological: Negative for dizziness and headaches.  Hematological: Does not bruise/bleed easily.  Psychiatric/Behavioral: Negative for agitation, behavioral problems (depression) and hallucinations.   Vital Signs: BP (!) 169/83   Ht 5' 5" (1.651 m)   Wt 211 lb (95.7 kg)   BMI 35.11 kg/m   Observation/Objective: Pt is in no acute distress, blood pressure is elevated, she did not take morning medications  Assessment/Plan: 1. Type 2 diabetes mellitus with hyperglycemia, with long-term current use of insulin (HCC) - Restart Humulin R U 500 take 47-65 tid, Add Trulicity q week.   2. Sarcoidosis of lung ([place]) - Stable  3. Morbid obesity ([place]) -.Obesity Counseling: Risk Assessment: An assessment of behavioral risk factors was made today and includes lack of exercise sedentary lifestyle, lack of portion control and poor dietary habits.  Risk Modification Advice: She was counseled on portion control guidelines. Restricting daily caloric intake to. . The detrimental long term effects of obesity on her health and ongoing poor compliance was also discussed with the  patient.  4. Essential hypertension, benign - Bp is elevated today however she will monitor  at home and will call back with few readings   5. Other hyperlipidemia - DC Simvastatin and start Lipitor 10 mg po qd   General Counseling: Katonya verbalizes understanding of the findings of today's phone visit and agrees with plan of treatment. I have discussed any further diagnostic evaluation that may be needed or ordered today. We also reviewed her medications today. she has been encouraged to call the office with any questions or concerns that should arise related to todays visit.  Meds ordered this encounter  Medications  . Dulaglutide (TRULICITY) 1.5 IY/6.4BR SOPN    Sig: Inject 1.5 mg into the skin once a week.    Dispense:  12 pen    Refill:  1  . atorvastatin (LIPITOR) 10 MG tablet    Sig: Take 1 tablet (10 mg total) by mouth daily. For cholestrol    Dispense:  90 tablet    Refill:  1    Time spent: 20  Minutes    Dr Lavera Guise Internal medicine

## 2019-12-07 ENCOUNTER — Telehealth: Payer: Self-pay

## 2019-12-07 ENCOUNTER — Other Ambulatory Visit: Payer: Self-pay

## 2019-12-07 ENCOUNTER — Encounter: Payer: Self-pay | Admitting: Podiatry

## 2019-12-07 ENCOUNTER — Ambulatory Visit (INDEPENDENT_AMBULATORY_CARE_PROVIDER_SITE_OTHER): Payer: Medicare Other | Admitting: Podiatry

## 2019-12-07 DIAGNOSIS — M79676 Pain in unspecified toe(s): Secondary | ICD-10-CM | POA: Diagnosis not present

## 2019-12-07 DIAGNOSIS — E1159 Type 2 diabetes mellitus with other circulatory complications: Secondary | ICD-10-CM | POA: Diagnosis not present

## 2019-12-07 DIAGNOSIS — B351 Tinea unguium: Secondary | ICD-10-CM

## 2019-12-07 NOTE — Telephone Encounter (Signed)
Called lmom informing patient of virtual visit. klh 

## 2019-12-07 NOTE — Telephone Encounter (Signed)
Confirmed virtual visit with patient. klh 

## 2019-12-07 NOTE — Progress Notes (Signed)
Complaint:  Visit Type: Patient returns to my office for continued preventative foot care services. Complaint: Patient states" my nails have grown long and thick and become painful to walk and wear shoes" Patient has been diagnosed with DM with  angiopathy.  The patient presents for preventative foot care services. No changes to ROS.    Podiatric Exam: Vascular: dorsalis pedis and posterior tibial pulses are barely palpable bilateral. Capillary return is immediate. Temperature gradient is WNL. Skin turgor WNL  Sensorium: Normal Semmes Weinstein monofilament test. Normal tactile sensation bilaterally. Nail Exam: Pt has thick disfigured discolored nails with subungual debris noted bilateral entire nail hallux through fifth toenails Ulcer Exam: There is no evidence of ulcer or pre-ulcerative changes or infection. Orthopedic Exam: Muscle tone and strength are WNL. No limitations in general ROM. No crepitus or effusions noted. Pes planus with HAV  B/L  .DJD STJ left foot. Skin: . No infection or ulcers.  Pinch callus  B/l  Diagnosis:  Onychomycosis, , Pain in right toe, pain in left toes,    Treatment & Plan Procedures and Treatment: Consent by patient was obtained for treatment procedures. The patient understood the discussion of treatment and procedures well. All questions were answered thoroughly reviewed. Debridement of mycotic and hypertrophic toenails, 1 through 5 bilateral and clearing of subungual debris. No ulceration, no infection noted. Return Visit-Office Procedure: Patient instructed to return to the office for a follow up visit 3 months for continued evaluation and treatment.    Keilana Morlock DPM 

## 2019-12-11 ENCOUNTER — Ambulatory Visit: Payer: Medicare Other | Admitting: Adult Health

## 2020-01-01 ENCOUNTER — Other Ambulatory Visit: Payer: Self-pay | Admitting: Adult Health

## 2020-01-11 ENCOUNTER — Telehealth: Payer: Self-pay

## 2020-01-11 NOTE — Telephone Encounter (Signed)
Confirmed appointment on 01/16/2020 and screened for covid. klh 

## 2020-01-12 ENCOUNTER — Other Ambulatory Visit: Payer: Self-pay

## 2020-01-12 ENCOUNTER — Telehealth: Payer: Self-pay

## 2020-01-12 DIAGNOSIS — Z91199 Patient's noncompliance with other medical treatment and regimen due to unspecified reason: Secondary | ICD-10-CM

## 2020-01-12 DIAGNOSIS — Z9119 Patient's noncompliance with other medical treatment and regimen: Secondary | ICD-10-CM

## 2020-01-12 MED ORDER — HUMULIN R U-500 KWIKPEN 500 UNIT/ML ~~LOC~~ SOPN: PEN_INJECTOR | SUBCUTANEOUS | 0 refills | Status: DC

## 2020-01-12 NOTE — Telephone Encounter (Signed)
As per dr Humphrey Rolls send humulin R and advised pt to bring all med on next visit and advised need to do some exercise and watch diet

## 2020-01-16 ENCOUNTER — Ambulatory Visit (INDEPENDENT_AMBULATORY_CARE_PROVIDER_SITE_OTHER): Payer: Medicare Other | Admitting: Adult Health

## 2020-01-16 ENCOUNTER — Other Ambulatory Visit: Payer: Self-pay

## 2020-01-16 ENCOUNTER — Encounter: Payer: Self-pay | Admitting: Adult Health

## 2020-01-16 VITALS — BP 145/71 | HR 76 | Temp 95.9°F | Resp 16 | Ht 63.0 in | Wt 211.4 lb

## 2020-01-16 DIAGNOSIS — E1165 Type 2 diabetes mellitus with hyperglycemia: Secondary | ICD-10-CM

## 2020-01-16 DIAGNOSIS — I10 Essential (primary) hypertension: Secondary | ICD-10-CM

## 2020-01-16 DIAGNOSIS — M1A09X0 Idiopathic chronic gout, multiple sites, without tophus (tophi): Secondary | ICD-10-CM

## 2020-01-16 DIAGNOSIS — K5900 Constipation, unspecified: Secondary | ICD-10-CM | POA: Diagnosis not present

## 2020-01-16 DIAGNOSIS — Z794 Long term (current) use of insulin: Secondary | ICD-10-CM

## 2020-01-16 DIAGNOSIS — Z91199 Patient's noncompliance with other medical treatment and regimen due to unspecified reason: Secondary | ICD-10-CM

## 2020-01-16 DIAGNOSIS — Z9119 Patient's noncompliance with other medical treatment and regimen: Secondary | ICD-10-CM

## 2020-01-16 MED ORDER — TRULICITY 3 MG/0.5ML ~~LOC~~ SOAJ: 3.0000 mg | SUBCUTANEOUS | 2 refills | Status: DC

## 2020-01-16 MED ORDER — LOSARTAN POTASSIUM 50 MG PO TABS: 50.0000 mg | ORAL_TABLET | Freq: Every day | ORAL | 3 refills | Status: DC

## 2020-01-16 MED ORDER — LINACLOTIDE 145 MCG PO CAPS: 145.0000 ug | ORAL_CAPSULE | Freq: Every day | ORAL | 3 refills | Status: DC

## 2020-01-16 MED ORDER — HUMULIN R U-500 KWIKPEN 500 UNIT/ML ~~LOC~~ SOPN: PEN_INJECTOR | SUBCUTANEOUS | 0 refills | Status: DC

## 2020-01-16 MED ORDER — ALLOPURINOL 300 MG PO TABS: 300.0000 mg | ORAL_TABLET | Freq: Every day | ORAL | 2 refills | Status: DC

## 2020-01-16 NOTE — Progress Notes (Signed)
[facility] [place], [place] [address]  Internal MEDICINE  Office Visit Note  Patient Name: Chelsey [name]  [id number]  [id number]  Date of Service: 01/16/2020  Chief Complaint  Patient presents with  . Follow-up  . Gastroesophageal Reflux  . Diabetes  . Hypertension  . Sleep Apnea  . Medication Refill    HPI  Pt is here for follow up on GERD, HTN, OSA and DM.  Overall she is doing well.  Her blood pressures is 145/71 today, she Denies Chest pain, Shortness of breath, palpitations, headache, or blurred vision.  She reports her blood sugar has been very high.  She reports she is taking 60 units of  u-500 insulin TID. She reports at times she is taking and extra 20 units to get her sugar down.    Current Medication: Outpatient Encounter Medications as of 01/16/2020  Medication Sig Note  . albuterol (PROAIR HFA) 108 (90 Base) MCG/ACT inhaler Inhale 2 puffs into the lungs every 6 (six) hours as needed for wheezing or shortness of breath.   . allopurinol (ZYLOPRIM) 300 MG tablet Take 1 tablet (300 mg total) by mouth daily.   Marland Kitchen amLODipine (NORVASC) 10 MG tablet TAKE 1 TABLET BY MOUTH DAILY   . aspirin EC 81 MG tablet Take 81 mg by mouth daily.   Marland Kitchen atorvastatin (LIPITOR) 10 MG tablet Take 1 tablet (10 mg total) by mouth daily. For cholestrol   . Blood Glucose Monitoring Suppl (ACCU-CHEK AVIVA PLUS) W/DEVICE KIT Use as directed. 12/20/2013: Received from: External Pharmacy  . colchicine 0.6 MG tablet TAKE 1 TO 2 TABLETS BY MOUTH DAILY AS NEEDED FOR GOUT FLARE **ONLY TAKE WITH GOUT FLARE**   . dapagliflozin propanediol (FARXIGA) 10 MG TABS tablet Take 10 mg by mouth daily. Take 1 tablet daily   . Dulaglutide (TRULICITY) 1.5 RN/1.6FB SOPN Inject 1.5 mg into the skin once a week.   . fluticasone (FLONASE) 50 MCG/ACT nasal spray Frequency:QD   Dosage:50   MCG  Instructions:  Note:2 sprays each nostril daily Dose: 50MCG 08/21/2016: Received from: [facility]  .  Fluticasone-Salmeterol (ADVAIR) 250-50 MCG/DOSE AEPB Inhale 1 puff into the lungs 2 (two) times daily.   Marland Kitchen gabapentin (NEURONTIN) 300 MG capsule Take 300 mg by mouth 3 (three) times daily.   Marland Kitchen glucose blood test strip Test sugar 4 x aday for uncontrolled dm on insulin E11.22   . hydrALAZINE (APRESOLINE) 10 MG tablet TAKE 1 TABLET BY MOUTH 3 TIMES A DAY   . hydroxychloroquine (PLAQUENIL) 200 MG tablet TAKE 1 TABLET BY MOUTH TWICE A DAY   . insulin regular human CONCENTRATED (HUMULIN R U-500 KWIKPEN) 500 UNIT/ML kwikpen Take insulin 60 units three times a day   . LANCETS ULTRA THIN MISC Apply 1 each topically daily.  09/20/2013: Received from: External Pharmacy  . levothyroxine (SYNTHROID, LEVOTHROID) 50 MCG tablet Take one tab in am on empty stomach for underactive thyroid   . linaclotide (LINZESS) 145 MCG CAPS capsule Take 1 capsule (145 mcg total) by mouth daily.   Marland Kitchen losartan (COZAAR) 50 MG tablet Take 1 tablet (50 mg total) by mouth daily. For htn   . omeprazole (PRILOSEC) 20 MG capsule Take 20 mg by mouth 2 (two) times daily before a meal.  09/20/2013: Received from: External Pharmacy  . OXYGEN Inhale 3 L into the lungs. Nighttime use   . polyethylene glycol (MIRALAX / GLYCOLAX) packet Take 17 g by mouth daily.   Marland Kitchen tretinoin (RETIN-A)  0.025 % cream Apply topically.   . triamcinolone cream (KENALOG) 0.1 % Apply twice a day to affected areas of dry itchy skin until clear   . ULTICARE SHORT PEN NEEDLES 31G X 8 MM MISC USE 3 TIMES A DAY   . [DISCONTINUED] allopurinol (ZYLOPRIM) 300 MG tablet Take 300 mg by mouth daily.    . [DISCONTINUED] insulin regular human CONCENTRATED (HUMULIN R U-500 KWIKPEN) 500 UNIT/ML kwikpen Take insulin 60 units three times a day   . [DISCONTINUED] linaclotide (LINZESS) 145 MCG CAPS capsule Take 1 capsule (145 mcg total) by mouth daily.   . [DISCONTINUED] losartan (COZAAR) 50 MG tablet Take 1 tablet (50 mg total) by mouth daily. For htn   . Dulaglutide (TRULICITY) 3  JQ/9.6KR SOPN Inject 3 mg into the skin once a week.    Facility-Administered Encounter Medications as of 01/16/2020  Medication  . betamethasone acetate-betamethasone sodium phosphate (CELESTONE) injection 3 mg    Surgical History: Past Surgical History:  Procedure Laterality Date  . BRAIN SURGERY  1990's  . CATARACT EXTRACTION W/PHACO Left 06/11/2016   Procedure: CATARACT EXTRACTION PHACO AND INTRAOCULAR LENS PLACEMENT (IOC);  Surgeon: Birder Robson, MD;  Location: [facility] ORS;  Service: Ophthalmology;  Laterality: Left;  [place] 1.01 AP% 21.6 CDE 13.23 Fluid pack lot # [id number]  . CEREBRAL ANEURYSM REPAIR  1990's   COILS  . CORONARY ARTERY BYPASS GRAFT    . EYE SURGERY  2000's   bilateral cataract  . FRACTURE SURGERY    . HERNIA REPAIR  2000   ventral  . STENT PLACEMENT VASCULAR ([place] HX)    . VEIN BYPASS SURGERY    . VENTRAL HERNIA REPAIR N/A 04/29/2016   Procedure: Laparoscopic HERNIA REPAIR VENTRAL ADULT;  Surgeon: Jules Husbands, MD;  Location: [facility] ORS;  Service: General;  Laterality: N/A;    Medical History: Past Medical History:  Diagnosis Date  . Anemia   . Aneurysm ([place])   . Arthritis    RHEUMATOID ARTHRITIS  . Asthma   . Collagen vascular disease ([place])   . COPD (chronic obstructive pulmonary disease) ([place])   . Coronary artery disease   . Diabetes mellitus without complication ([place])   . Edema    FEET/LEGS  . GERD (gastroesophageal reflux disease)   . Gout   . H/O wheezing   . History of hiatal hernia   . Hypertension   . Hypothyroidism   . Neuropathy   . Oxygen deficiency    HS  . Peripheral vascular disease ([place])   . Sarcoidosis of lung ([place])   . Seizures ([place])    WITH BRAIN 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   . Sleep apnea    OXYGEN AT NIGHT 3 L [place]    Family History: Family History  Problem Relation Age of Onset  . Heart disease Mother   . Hypertension Mother   . Diabetes Mother   . Diabetes Father   . Heart disease Father   . Hypertension Father   .  Breast cancer Maternal Aunt     Social History   Socioeconomic History  . Marital status: Single    Spouse name: Not on file  . Number of children: Not on file  . Years of education: Not on file  . Highest education level: Not on file  Occupational History  . Not on file  Tobacco Use  . Smoking status: Former Smoker    Packs/day: 1.00    Years: 30.00    Pack years: 30.00  Types: Cigarettes    Quit date: 01/27/2006    Years since quitting: 13.9  . Smokeless tobacco: Never Used  . Tobacco comment: quit   Substance and Sexual Activity  . Alcohol use: No    Alcohol/week: 0.0 standard drinks  . Drug use: No  . Sexual activity: Not on file  Other Topics Concern  . Not on file  Social History Narrative  . Not on file   Social Determinants of Health   Financial Resource Strain:   . Difficulty of Paying Living Expenses: Not on file  Food Insecurity:   . Worried About Charity fundraiser in the Last Year: Not on file  . Ran Out of Food in the Last Year: Not on file  Transportation Needs:   . Lack of Transportation (Medical): Not on file  . Lack of Transportation (Non-Medical): Not on file  Physical Activity:   . Days of Exercise per Week: Not on file  . Minutes of Exercise per Session: Not on file  Stress:   . Feeling of Stress : Not on file  Social Connections:   . Frequency of Communication with Friends and Family: Not on file  . Frequency of Social Gatherings with Friends and Family: Not on file  . Attends Religious Services: Not on file  . Active Member of Clubs or Organizations: Not on file  . Attends Archivist Meetings: Not on file  . Marital Status: Not on file  Intimate Partner Violence:   . Fear of Current or Ex-Partner: Not on file  . Emotionally Abused: Not on file  . Physically Abused: Not on file  . Sexually Abused: Not on file      Review of Systems  Vital Signs: BP (!) 145/71   Pulse 76   Temp (!) 95.9 F (35.5 C)   Resp 16   Ht  5' 3"  (1.6 m)   Wt 211 lb 6.4 oz (95.9 kg)   SpO2 96%   BMI 37.45 kg/m    Physical Exam Vitals and nursing note reviewed.  Constitutional:      General: She is not in acute distress.    Appearance: She is well-developed. She is not diaphoretic.  HENT:     Head: Normocephalic and atraumatic.     Mouth/Throat:     Pharynx: No oropharyngeal exudate.  Eyes:     Pupils: Pupils are equal, round, and reactive to light.  Neck:     Thyroid: No thyromegaly.     Vascular: No JVD.     Trachea: No tracheal deviation.  Cardiovascular:     Rate and Rhythm: Normal rate and regular rhythm.     Heart sounds: Normal heart sounds. No murmur. No friction rub. No gallop.   Pulmonary:     Effort: Pulmonary effort is normal. No respiratory distress.     Breath sounds: Normal breath sounds. No wheezing or rales.  Chest:     Chest wall: No tenderness.  Abdominal:     Palpations: Abdomen is soft.     Tenderness: There is no abdominal tenderness. There is no guarding.  Musculoskeletal:        General: Normal range of motion.     Cervical back: Normal range of motion and neck supple.  Lymphadenopathy:     Cervical: No cervical adenopathy.  Skin:    General: Skin is warm and dry.  Neurological:     Mental Status: She is alert and oriented to person, place, and time.  Cranial Nerves: No cranial nerve deficit.  Psychiatric:        Behavior: Behavior normal.        Thought Content: Thought content normal.        Judgment: Judgment normal.    Assessment/Plan: 1. Type 2 diabetes mellitus with hyperglycemia, with long-term current use of insulin (HCC) Increase Trulicity to 62m weekly.  Continue using U-500 insulin at 60 units TID.   - insulin regular human CONCENTRATED (HUMULIN R U-500 KWIKPEN) 500 UNIT/ML kwikpen; Take insulin 60 units three times a day  Dispense: 2 pen; Refill: 0 - Dulaglutide (TRULICITY) 3 MWN/4.6EVSOPN; Inject 3 mg into the skin once a week.  Dispense: 5 pen; Refill: 2  2.  Essential hypertension, benign Continue losartan as directed, bp improving.  - losartan (COZAAR) 50 MG tablet; Take 1 tablet (50 mg total) by mouth daily. For htn  Dispense: 90 tablet; Refill: 3  3. Idiopathic chronic gout of multiple sites without tophus Refilled allopurinol.  - allopurinol (ZYLOPRIM) 300 MG tablet; Take 1 tablet (300 mg total) by mouth daily.  Dispense: 90 tablet; Refill: 2  4. Constipation, unspecified constipation type Refilled Linzess, continue to use as directed.  - linaclotide (LINZESS) 145 MCG CAPS capsule; Take 1 capsule (145 mcg total) by mouth daily.  Dispense: 30 capsule; Refill: 3  5. Current non-adherence to medical treatment Questionable compliance with medications.   General Counseling: Ammarie verbalizes understanding of the findings of todays visit and agrees with plan of treatment. I have discussed any further diagnostic evaluation that may be needed or ordered today. We also reviewed her medications today. she has been encouraged to call the office with any questions or concerns that should arise related to todays visit.    No orders of the defined types were placed in this encounter.   Meds ordered this encounter  Medications  . linaclotide (LINZESS) 145 MCG CAPS capsule    Sig: Take 1 capsule (145 mcg total) by mouth daily.    Dispense:  30 capsule    Refill:  3  . insulin regular human CONCENTRATED (HUMULIN R U-500 KWIKPEN) 500 UNIT/ML kwikpen    Sig: Take insulin 60 units three times a day    Dispense:  2 pen    Refill:  0  . allopurinol (ZYLOPRIM) 300 MG tablet    Sig: Take 1 tablet (300 mg total) by mouth daily.    Dispense:  90 tablet    Refill:  2  . losartan (COZAAR) 50 MG tablet    Sig: Take 1 tablet (50 mg total) by mouth daily. For htn    Dispense:  90 tablet    Refill:  3  . Dulaglutide (TRULICITY) 3 MOJ/5.0KXSOPN    Sig: Inject 3 mg into the skin once a week.    Dispense:  5 pen    Refill:  2    Time spent: 25  Minutes   This patient was seen by AOrson GearAGNP-C in Collaboration with Dr FLavera Guiseas a part of collaborative care agreement     AKendell BaneAGNP-C Internal medicine

## 2020-02-05 ENCOUNTER — Ambulatory Visit: Payer: Medicare Other | Admitting: Internal Medicine

## 2020-02-09 ENCOUNTER — Other Ambulatory Visit: Payer: Self-pay | Admitting: Internal Medicine

## 2020-02-09 ENCOUNTER — Telehealth: Payer: Self-pay

## 2020-02-09 NOTE — Telephone Encounter (Signed)
Confirmed appointment on 02/13/2020 and screened for covid.. klh 

## 2020-02-12 ENCOUNTER — Other Ambulatory Visit: Payer: Self-pay | Admitting: Internal Medicine

## 2020-02-12 ENCOUNTER — Ambulatory Visit: Payer: Medicare Other | Admitting: Internal Medicine

## 2020-02-13 ENCOUNTER — Encounter: Payer: Self-pay | Admitting: Adult Health

## 2020-02-13 ENCOUNTER — Other Ambulatory Visit: Payer: Self-pay | Admitting: Internal Medicine

## 2020-02-13 ENCOUNTER — Other Ambulatory Visit: Payer: Self-pay

## 2020-02-13 ENCOUNTER — Ambulatory Visit (INDEPENDENT_AMBULATORY_CARE_PROVIDER_SITE_OTHER): Payer: Medicare Other | Admitting: Adult Health

## 2020-02-13 VITALS — BP 119/66 | HR 71 | Temp 96.3°F | Resp 16 | Ht 63.0 in | Wt 211.0 lb

## 2020-02-13 DIAGNOSIS — I10 Essential (primary) hypertension: Secondary | ICD-10-CM | POA: Diagnosis not present

## 2020-02-13 DIAGNOSIS — E782 Mixed hyperlipidemia: Secondary | ICD-10-CM | POA: Diagnosis not present

## 2020-02-13 DIAGNOSIS — D860 Sarcoidosis of lung: Secondary | ICD-10-CM

## 2020-02-13 DIAGNOSIS — Z91199 Patient's noncompliance with other medical treatment and regimen due to unspecified reason: Secondary | ICD-10-CM

## 2020-02-13 DIAGNOSIS — Z794 Long term (current) use of insulin: Secondary | ICD-10-CM

## 2020-02-13 DIAGNOSIS — E1165 Type 2 diabetes mellitus with hyperglycemia: Secondary | ICD-10-CM | POA: Diagnosis not present

## 2020-02-13 DIAGNOSIS — Z9119 Patient's noncompliance with other medical treatment and regimen: Secondary | ICD-10-CM | POA: Diagnosis not present

## 2020-02-13 DIAGNOSIS — Z6837 Body mass index (BMI) 37.0-37.9, adult: Secondary | ICD-10-CM

## 2020-02-13 LAB — POCT GLYCOSYLATED HEMOGLOBIN (HGB A1C): Hemoglobin A1C: 10.9 % — AB (ref 4.0–5.6)

## 2020-02-13 MED ORDER — HUMULIN R U-500 KWIKPEN 500 UNIT/ML ~~LOC~~ SOPN: PEN_INJECTOR | SUBCUTANEOUS | 2 refills | Status: DC

## 2020-02-13 NOTE — Progress Notes (Signed)
[facility] [place], [place] [address]  Internal MEDICINE  Office Visit Note  Patient Name: Chelsey [name]  [id number]  [id number]  Date of Service: 02/13/2020  Chief Complaint  Patient presents with  . Follow-up  . Diabetes  . Sleep Apnea  . Hypertension    HPI PT is here for follow up on DM.  She remains uncontrolled and her A1C is now 10.9.  She reports she is taking 65 units of Humulin concentrated at every meal.  Also, she is taking trulicity 83m weekly.  She reports blood sugars are above 300 daily.  Some days the machine will not read, because it is so high.  She denies eating sweets, or excessive carbohydrates. Sample daily intake yesterday oatmeal and two tKuwaitlink sausages for breakfast.  Bologna sandwich for lunch, and collard greens with a piece of country ham for dinner.  She denies snacking.  She drinks diet soda.  She does not consume alcohol.        Current Medication: Outpatient Encounter Medications as of 02/13/2020  Medication Sig Note  . albuterol (PROAIR HFA) 108 (90 Base) MCG/ACT inhaler Inhale 2 puffs into the lungs every 6 (six) hours as needed for wheezing or shortness of breath.   . allopurinol (ZYLOPRIM) 300 MG tablet Take 1 tablet (300 mg total) by mouth daily.   .Marland KitchenamLODipine (NORVASC) 10 MG tablet TAKE 1 TABLET BY MOUTH DAILY   . aspirin EC 81 MG tablet Take 81 mg by mouth daily.   .Marland Kitchenatorvastatin (LIPITOR) 10 MG tablet Take 1 tablet (10 mg total) by mouth daily. For cholestrol   . Blood Glucose Monitoring Suppl (ACCU-CHEK AVIVA PLUS) W/DEVICE KIT Use as directed. 12/20/2013: Received from: External Pharmacy  . colchicine 0.6 MG tablet TAKE 1 TO 2 TABLETS BY MOUTH DAILY AS NEEDED FOR GOUT FLARE **ONLY TAKE WITH GOUT FLARE**   . dapagliflozin propanediol (FARXIGA) 10 MG TABS tablet Take 10 mg by mouth daily. Take 1 tablet daily   . Dulaglutide (TRULICITY) 3 MGN/5.6OZSOPN Inject 3 mg into the skin once a week.   . fluticasone  (FLONASE) 50 MCG/ACT nasal spray Frequency:QD   Dosage:50   MCG  Instructions:  Note:2 sprays each nostril daily Dose: 50MCG 08/21/2016: [facility] Fluticasone-Salmeterol (ADVAIR) 250-50 MCG/DOSE AEPB Inhale 1 puff into the lungs 2 (two) times daily.   .Marland Kitchengabapentin (NEURONTIN) 300 MG capsule Take 300 mg by mouth 3 (three) times daily.   .Marland Kitchenglucose blood test strip Test sugar 4 x aday for uncontrolled dm on insulin E11.22   . hydrALAZINE (APRESOLINE) 10 MG tablet TAKE 1 TABLET BY MOUTH 3 TIMES A DAY   . hydroxychloroquine (PLAQUENIL) 200 MG tablet TAKE 1 TABLET BY MOUTH TWICE A DAY   . LANCETS ULTRA THIN MISC Apply 1 each topically daily.  09/20/2013: Received from: External Pharmacy  . levothyroxine (SYNTHROID, LEVOTHROID) 50 MCG tablet Take one tab in am on empty stomach for underactive thyroid   . linaclotide (LINZESS) 145 MCG CAPS capsule Take 1 capsule (145 mcg total) by mouth daily.   .Marland Kitchenlosartan (COZAAR) 50 MG tablet Take 1 tablet (50 mg total) by mouth daily. For htn   . omeprazole (PRILOSEC) 20 MG capsule Take 20 mg by mouth 2 (two) times daily before a meal.  09/20/2013: Received from: External Pharmacy  . OXYGEN Inhale 3 L into the lungs. Nighttime use   . polyethylene glycol (MIRALAX / GLYCOLAX) packet Take  17 g by mouth daily.   Marland Kitchen tretinoin (RETIN-A) 0.025 % cream Apply topically.   . triamcinolone cream (KENALOG) 0.1 % Apply twice a day to affected areas of dry itchy skin until clear   . ULTICARE SHORT PEN NEEDLES 31G X 8 MM MISC USE 3 TIMES A DAY   . [DISCONTINUED] Dulaglutide (TRULICITY) 1.5 ZO/1.0RU SOPN Inject 1.5 mg into the skin once a week.   . [DISCONTINUED] insulin regular human CONCENTRATED (HUMULIN R U-500 KWIKPEN) 500 UNIT/ML kwikpen Take insulin 60 units three times a day   . insulin regular human CONCENTRATED (HUMULIN R U-500 KWIKPEN) 500 UNIT/ML kwikpen Take insulin 60 units three times a day    Facility-Administered Encounter Medications as of  02/13/2020  Medication  . betamethasone acetate-betamethasone sodium phosphate (CELESTONE) injection 3 mg    Surgical History: Past Surgical History:  Procedure Laterality Date  . BRAIN SURGERY  1990's  . CATARACT EXTRACTION W/PHACO Left 06/11/2016   Procedure: CATARACT EXTRACTION PHACO AND INTRAOCULAR LENS PLACEMENT (IOC);  Surgeon: Birder Robson, MD;  Location: [facility] ORS;  Service: Ophthalmology;  Laterality: Left;  [place] 1.01 AP% 21.6 CDE 13.23 Fluid pack lot # [id number]  . CEREBRAL ANEURYSM REPAIR  1990's   COILS  . CORONARY ARTERY BYPASS GRAFT    . EYE SURGERY  2000's   bilateral cataract  . FRACTURE SURGERY    . HERNIA REPAIR  2000   ventral  . STENT PLACEMENT VASCULAR ([place] HX)    . VEIN BYPASS SURGERY    . VENTRAL HERNIA REPAIR N/A 04/29/2016   Procedure: Laparoscopic HERNIA REPAIR VENTRAL ADULT;  Surgeon: Jules Husbands, MD;  Location: [facility] ORS;  Service: General;  Laterality: N/A;    Medical History: Past Medical History:  Diagnosis Date  . Anemia   . Aneurysm ([place])   . Arthritis    RHEUMATOID ARTHRITIS  . Asthma   . Collagen vascular disease ([place])   . COPD (chronic obstructive pulmonary disease) ([place])   . Coronary artery disease   . Diabetes mellitus without complication ([place])   . Edema    FEET/LEGS  . GERD (gastroesophageal reflux disease)   . Gout   . H/O wheezing   . History of hiatal hernia   . Hypertension   . Hypothyroidism   . Neuropathy   . Oxygen deficiency    HS  . Peripheral vascular disease ([place])   . Sarcoidosis of lung ([place])   . Seizures ([place])    WITH BRAIN 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   . Sleep apnea    OXYGEN AT NIGHT 3 L [place]    Family History: Family History  Problem Relation Age of Onset  . Heart disease Mother   . Hypertension Mother   . Diabetes Mother   . Diabetes Father   . Heart disease Father   . Hypertension Father   . Breast cancer Maternal Aunt     Social History   Socioeconomic History  . Marital status: Single     Spouse name: Not on file  . Number of children: Not on file  . Years of education: Not on file  . Highest education level: Not on file  Occupational History  . Not on file  Tobacco Use  . Smoking status: Former Smoker    Packs/day: 1.00    Years: 30.00    Pack years: 30.00    Types: Cigarettes    Quit date: 01/27/2006    Years since quitting: 14.0  . Smokeless tobacco: Never Used  .  Tobacco comment: quit   Substance and Sexual Activity  . Alcohol use: No    Alcohol/week: 0.0 standard drinks  . Drug use: No  . Sexual activity: Not on file  Other Topics Concern  . Not on file  Social History Narrative  . Not on file   Social Determinants of Health   Financial Resource Strain:   . Difficulty of Paying Living Expenses:   Food Insecurity:   . Worried About Charity fundraiser in the Last Year:   . Arboriculturist in the Last Year:   Transportation Needs:   . Film/video editor (Medical):   Marland Kitchen Lack of Transportation (Non-Medical):   Physical Activity:   . Days of Exercise per Week:   . Minutes of Exercise per Session:   Stress:   . Feeling of Stress :   Social Connections:   . Frequency of Communication with Friends and Family:   . Frequency of Social Gatherings with Friends and Family:   . Attends Religious Services:   . Active Member of Clubs or Organizations:   . Attends Archivist Meetings:   Marland Kitchen Marital Status:   Intimate Partner Violence:   . Fear of Current or Ex-Partner:   . Emotionally Abused:   Marland Kitchen Physically Abused:   . Sexually Abused:       Review of Systems  Constitutional: Negative for chills, fatigue and unexpected weight change.  HENT: Negative for congestion, rhinorrhea, sneezing and sore throat.   Eyes: Negative for photophobia, pain and redness.  Respiratory: Negative for cough, chest tightness and shortness of breath.   Cardiovascular: Negative for chest pain and palpitations.  Gastrointestinal: Negative for abdominal pain,  constipation, diarrhea, nausea and vomiting.  Endocrine: Negative.   Genitourinary: Negative for dysuria and frequency.  Musculoskeletal: Negative for arthralgias, back pain, joint swelling and neck pain.  Skin: Negative for rash.  Allergic/Immunologic: Negative.   Neurological: Negative for tremors and numbness.  Hematological: Negative for adenopathy. Does not bruise/bleed easily.  Psychiatric/Behavioral: Negative for behavioral problems and sleep disturbance. The patient is not nervous/anxious.     Vital Signs: BP 119/66   Pulse 71   Temp (!) 96.3 F (35.7 C)   Resp 16   Ht 5' 3"  (1.6 m)   Wt 211 lb (95.7 kg)   SpO2 100%   BMI 37.38 kg/m    Physical Exam Vitals and nursing note reviewed.  Constitutional:      General: She is not in acute distress.    Appearance: She is well-developed. She is not diaphoretic.  HENT:     Head: Normocephalic and atraumatic.     Mouth/Throat:     Pharynx: No oropharyngeal exudate.  Eyes:     Pupils: Pupils are equal, round, and reactive to light.  Neck:     Thyroid: No thyromegaly.     Vascular: No JVD.     Trachea: No tracheal deviation.  Cardiovascular:     Rate and Rhythm: Normal rate and regular rhythm.     Heart sounds: Normal heart sounds. No murmur. No friction rub. No gallop.   Pulmonary:     Effort: Pulmonary effort is normal. No respiratory distress.     Breath sounds: Normal breath sounds. No wheezing or rales.  Chest:     Chest wall: No tenderness.  Abdominal:     Palpations: Abdomen is soft.     Tenderness: There is no abdominal tenderness. There is no guarding.  Musculoskeletal:  General: Normal range of motion.     Cervical back: Normal range of motion and neck supple.  Lymphadenopathy:     Cervical: No cervical adenopathy.  Skin:    General: Skin is warm and dry.  Neurological:     Mental Status: She is alert and oriented to person, place, and time.     Cranial Nerves: No cranial nerve deficit.   Psychiatric:        Behavior: Behavior normal.        Thought Content: Thought content normal.        Judgment: Judgment normal.     Assessment/Plan: 1. Type 2 diabetes mellitus with hyperglycemia, with long-term current use of insulin (HCC) Her A1C is elevated again, this time 10.9.  Pt will need to see endocrinology for further management.  - POCT HgB A1C -Ambulatory referral to [facility] endocrinology.  2. Essential hypertension, benign Controlled,continue present management.   3. Mixed hyperlipidemia Stable, continue   4. Current non-adherence to medical treatment Questionable adherence to medication regimen and   5. Sarcoidosis of lung (HCC) Stable, continue to monitor.   6. BMI 37.0-37.9, adult comorbidities include DM, HTN, and HLD. Obesity Counseling: Risk Assessment: An assessment of behavioral risk factors was made today and includes lack of exercise sedentary lifestyle, lack of portion control and poor dietary habits.  Risk Modification Advice: She was counseled on portion control guidelines. Restricting daily caloric intake to 1800. The detrimental long term effects of obesity on her health and ongoing poor compliance was also discussed with the patient.    General Counseling: Finleigh verbalizes understanding of the findings of todays visit and agrees with plan of treatment. I have discussed any further diagnostic evaluation that may be needed or ordered today. We also reviewed her medications today. she has been encouraged to call the office with any questions or concerns that should arise related to todays visit.    Orders Placed This Encounter  Procedures  . POCT HgB A1C    Meds ordered this encounter  Medications  . insulin regular human CONCENTRATED (HUMULIN R U-500 KWIKPEN) 500 UNIT/ML kwikpen    Sig: Take insulin 60 units three times a day    Dispense:  4 pen    Refill:  2    Time spent: 30 Minutes   This patient was seen by Orson Gear AGNP-C in  Collaboration with Dr Lavera Guise as a part of collaborative care agreement     Kendell Bane AGNP-C Internal medicine

## 2020-02-14 ENCOUNTER — Other Ambulatory Visit: Payer: Self-pay

## 2020-02-14 DIAGNOSIS — E1165 Type 2 diabetes mellitus with hyperglycemia: Secondary | ICD-10-CM

## 2020-02-14 DIAGNOSIS — Z794 Long term (current) use of insulin: Secondary | ICD-10-CM

## 2020-02-14 MED ORDER — FARXIGA 10 MG PO TABS: 10.0000 mg | ORAL_TABLET | Freq: Every day | ORAL | 3 refills | Status: DC

## 2020-02-14 MED ORDER — HUMULIN R U-500 KWIKPEN 500 UNIT/ML ~~LOC~~ SOPN: PEN_INJECTOR | SUBCUTANEOUS | 3 refills | Status: DC

## 2020-02-15 ENCOUNTER — Other Ambulatory Visit: Payer: Self-pay

## 2020-02-15 DIAGNOSIS — E1165 Type 2 diabetes mellitus with hyperglycemia: Secondary | ICD-10-CM

## 2020-02-15 DIAGNOSIS — Z794 Long term (current) use of insulin: Secondary | ICD-10-CM

## 2020-02-15 MED ORDER — HUMULIN R U-500 KWIKPEN 500 UNIT/ML ~~LOC~~ SOPN: PEN_INJECTOR | SUBCUTANEOUS | 3 refills | Status: DC

## 2020-02-29 ENCOUNTER — Other Ambulatory Visit: Payer: Self-pay | Admitting: Internal Medicine

## 2020-03-04 ENCOUNTER — Other Ambulatory Visit: Payer: Self-pay | Admitting: Adult Health

## 2020-03-07 ENCOUNTER — Ambulatory Visit: Payer: Medicare Other | Admitting: Podiatry

## 2020-03-15 ENCOUNTER — Other Ambulatory Visit: Payer: Self-pay | Admitting: Internal Medicine

## 2020-03-21 ENCOUNTER — Other Ambulatory Visit: Payer: Self-pay

## 2020-03-21 ENCOUNTER — Ambulatory Visit (INDEPENDENT_AMBULATORY_CARE_PROVIDER_SITE_OTHER): Payer: Medicare Other | Admitting: Podiatry

## 2020-03-21 ENCOUNTER — Encounter: Payer: Self-pay | Admitting: Podiatry

## 2020-03-21 VITALS — Temp 96.7°F

## 2020-03-21 DIAGNOSIS — M79676 Pain in unspecified toe(s): Secondary | ICD-10-CM | POA: Diagnosis not present

## 2020-03-21 DIAGNOSIS — E1159 Type 2 diabetes mellitus with other circulatory complications: Secondary | ICD-10-CM

## 2020-03-21 DIAGNOSIS — B351 Tinea unguium: Secondary | ICD-10-CM

## 2020-03-21 NOTE — Progress Notes (Signed)
This patient returns to my office for at risk foot care.  This patient requires this care by a professional since this patient will be at risk due to having  Type 2 diabetes and peripheral vascular disease.  .  This patient is unable to cut nails herself since the patient cannot reach her nails.These nails are painful walking and wearing shoes.  This patient presents for at risk foot care today.  General Appearance  Alert, conversant and in no acute stress.  Vascular  Dorsalis pedis and posterior tibial  pulses are barely  palpable  bilaterally.  Capillary return is within normal limits  bilaterally. Temperature is within normal limits  bilaterally.  Neurologic  Senn-Weinstein monofilament wire test within normal limits  bilaterally. Muscle power within normal limits bilaterally.  Nails Thick disfigured discolored nails with subungual debris  from hallux to fifth toes bilaterally. No evidence of bacterial infection or drainage bilaterally.  Orthopedic  No limitations of motion  feet .  No crepitus or effusions noted.  No bony pathology or digital deformities noted.  Pes planus and HAV  B/L.  DJD STJ left foot.  Skin  normotropic skin with no porokeratosis noted bilaterally.  No signs of infections or ulcers noted.   Pinch callus  B/l.  Onychomycosis  Pain in right toes  Pain in left toes  Consent was obtained for treatment procedures.   Mechanical debridement of nails 1-5  bilaterally performed with a nail nipper.  Filed with dremel without incident.    Return office visit    3 months                  Told patient to return for periodic foot care and evaluation due to potential at risk complications.   Gardiner Barefoot DPM

## 2020-04-03 ENCOUNTER — Other Ambulatory Visit: Payer: Self-pay | Admitting: Internal Medicine

## 2020-04-16 ENCOUNTER — Other Ambulatory Visit: Payer: Self-pay | Admitting: Nephrology

## 2020-04-16 DIAGNOSIS — N2581 Secondary hyperparathyroidism of renal origin: Secondary | ICD-10-CM | POA: Insufficient documentation

## 2020-04-16 DIAGNOSIS — N281 Cyst of kidney, acquired: Secondary | ICD-10-CM

## 2020-04-16 DIAGNOSIS — N1832 Chronic kidney disease, stage 3b: Secondary | ICD-10-CM | POA: Insufficient documentation

## 2020-04-26 ENCOUNTER — Ambulatory Visit
Admission: RE | Admit: 2020-04-26 | Discharge: 2020-04-26 | Disposition: A | Discharge status: Home / Self Care | Payer: Medicare Other | Source: Ambulatory Visit | Attending: Nephrology | Admitting: Nephrology

## 2020-04-26 ENCOUNTER — Other Ambulatory Visit: Payer: Self-pay

## 2020-04-26 DIAGNOSIS — N281 Cyst of kidney, acquired: Secondary | ICD-10-CM | POA: Insufficient documentation

## 2020-05-09 ENCOUNTER — Other Ambulatory Visit: Payer: Self-pay | Admitting: Internal Medicine

## 2020-05-09 NOTE — Telephone Encounter (Signed)
Ok to send this pt last in3/21

## 2020-05-10 ENCOUNTER — Telehealth: Payer: Self-pay

## 2020-05-10 NOTE — Telephone Encounter (Signed)
Confirmed appointment on 05/14/2020 and screened for covid. klh 

## 2020-05-14 ENCOUNTER — Ambulatory Visit (INDEPENDENT_AMBULATORY_CARE_PROVIDER_SITE_OTHER): Payer: Medicaid Other | Admitting: Adult Health

## 2020-05-14 ENCOUNTER — Other Ambulatory Visit: Payer: Self-pay

## 2020-05-14 VITALS — BP 132/63 | HR 60 | Temp 97.6°F | Resp 16 | Ht 63.0 in | Wt 214.4 lb

## 2020-05-14 DIAGNOSIS — I10 Essential (primary) hypertension: Secondary | ICD-10-CM

## 2020-05-14 DIAGNOSIS — E1165 Type 2 diabetes mellitus with hyperglycemia: Secondary | ICD-10-CM

## 2020-05-14 DIAGNOSIS — Z6837 Body mass index (BMI) 37.0-37.9, adult: Secondary | ICD-10-CM

## 2020-05-14 DIAGNOSIS — Z794 Long term (current) use of insulin: Secondary | ICD-10-CM

## 2020-05-14 DIAGNOSIS — R600 Localized edema: Secondary | ICD-10-CM

## 2020-05-14 LAB — POCT GLYCOSYLATED HEMOGLOBIN (HGB A1C): Hemoglobin A1C: 9 % — AB (ref 4.0–5.6)

## 2020-05-14 NOTE — Progress Notes (Signed)
[facility] [place], [place] [address]  Internal MEDICINE  Office Visit Note  Patient Name: Chelsey [name]  [id number]  [id number]  Date of Service: 05/14/2020  Chief Complaint  Patient presents with  . Follow-up  . Diabetes  . Gastroesophageal Reflux  . Hypertension    HPI  Pt is here for follow up on DM, GERD and Htn.  She reports she saw Endocrinology at [facility]  Earlier this month.  They have stopeed her farxiga.  They started low dose metformin, and increased her insulin to 65 units at meals.  Her A1C is improved to 9.0 at this time.  She also is using the freestyle libre.  She is able to see her blood sugars regularly.  Her  Blood pressure is well controlled at this time. Denies Chest pain, Shortness of breath, palpitations, headache, or blurred vision.  Her gerd is well controlled.  She does report some intermittent swelling of her left leg.  She describes circumferential pain, and tenderness.  Also that she can see a bulging that appears to be a blood vessell.  It is not present today, although there is some minor swelling noted to later leg. It does not appear red, or feel warm. She has seen vascular in the past, but does not recall the last time she saw them.    Current Medication: Outpatient Encounter Medications as of 05/14/2020  Medication Sig Note  . albuterol (PROAIR HFA) 108 (90 Base) MCG/ACT inhaler Inhale 2 puffs into the lungs every 6 (six) hours as needed for wheezing or shortness of breath.   . allopurinol (ZYLOPRIM) 300 MG tablet Take 1 tablet (300 mg total) by mouth daily.   Marland Kitchen amLODipine (NORVASC) 10 MG tablet TAKE 1 TABLET BY MOUTH DAILY   . aspirin EC 81 MG tablet Take 81 mg by mouth daily.   Marland Kitchen atorvastatin (LIPITOR) 10 MG tablet Take 1 tablet (10 mg total) by mouth daily. For cholestrol   . Blood Glucose Monitoring Suppl (ACCU-CHEK AVIVA PLUS) W/DEVICE KIT Use as directed. 12/20/2013: Received from: External Pharmacy  . colchicine 0.6 MG  tablet TAKE 1 TO 2 TABLETS BY MOUTH DAILY AS NEEDED FOR GOUT FLARE **ONLY TAKE WITH GOUT FLARE**   . dapagliflozin propanediol (FARXIGA) 10 MG TABS tablet Take 10 mg by mouth daily. Take 1 tablet daily   . Dulaglutide (TRULICITY) 3 OL/0.7EM SOPN Inject 3 mg into the skin once a week.   . fluticasone (FLONASE) 50 MCG/ACT nasal spray Frequency:QD   Dosage:50   MCG  Instructions:  Note:2 sprays each nostril daily Dose: 50MCG 08/21/2016: Received from: [facility]  . Fluticasone-Salmeterol (ADVAIR) 250-50 MCG/DOSE AEPB Inhale 1 puff into the lungs 2 (two) times daily.   Marland Kitchen gabapentin (NEURONTIN) 300 MG capsule Take 300 mg by mouth 3 (three) times daily.   Marland Kitchen glucose blood test strip Test sugar 4 x aday for uncontrolled dm on insulin E11.22   . hydrALAZINE (APRESOLINE) 10 MG tablet TAKE 1 TABLET BY MOUTH 3 TIMES A DAY   . hydroxychloroquine (PLAQUENIL) 200 MG tablet TAKE 1 TABLET BY MOUTH TWICE A DAY   . insulin regular human CONCENTRATED (HUMULIN R U-500 KWIKPEN) 500 UNIT/ML kwikpen Take insulin 60 units three times a day   . LANCETS ULTRA THIN MISC Apply 1 each topically daily.  09/20/2013: Received from: External Pharmacy  . levothyroxine (SYNTHROID) 50 MCG tablet TAKE 1 TABLET BY MOUTH IN THE MORNING ONAN EMPTY STOMACH FOR UNDERACTIVE THYROID   .  linaclotide (LINZESS) 145 MCG CAPS capsule Take 1 capsule (145 mcg total) by mouth daily.   Marland Kitchen losartan (COZAAR) 50 MG tablet Take 1 tablet (50 mg total) by mouth daily. For htn   . metFORMIN (GLUCOPHAGE) 500 MG tablet Take 1 tablet by mouth 2 (two) times daily.   Marland Kitchen omeprazole (PRILOSEC) 20 MG capsule TAKE 1 CAPSULE BY MOUTH DAILY   . OXYGEN Inhale 3 L into the lungs. Nighttime use   . polyethylene glycol (MIRALAX / GLYCOLAX) packet Take 17 g by mouth daily.   Marland Kitchen tretinoin (RETIN-A) 0.025 % cream Apply topically.   . triamcinolone cream (KENALOG) 0.1 % Apply twice a day to affected areas of dry itchy skin until clear   . ULTICARE SHORT PEN NEEDLES 31G X 8  MM MISC USE 3 TIMES A DAY    Facility-Administered Encounter Medications as of 05/14/2020  Medication  . betamethasone acetate-betamethasone sodium phosphate (CELESTONE) injection 3 mg    Surgical History: Past Surgical History:  Procedure Laterality Date  . BRAIN SURGERY  1990's  . CATARACT EXTRACTION W/PHACO Left 06/11/2016   Procedure: CATARACT EXTRACTION PHACO AND INTRAOCULAR LENS PLACEMENT (IOC);  Surgeon: Birder Robson, MD;  Location: [facility] ORS;  Service: Ophthalmology;  Laterality: Left;  [place] 1.01 AP% 21.6 CDE 13.23 Fluid pack lot # [id number]  . CEREBRAL ANEURYSM REPAIR  1990's   COILS  . CORONARY ARTERY BYPASS GRAFT    . EYE SURGERY  2000's   bilateral cataract  . FRACTURE SURGERY    . HERNIA REPAIR  2000   ventral  . STENT PLACEMENT VASCULAR ([place] HX)    . VEIN BYPASS SURGERY    . VENTRAL HERNIA REPAIR N/A 04/29/2016   Procedure: Laparoscopic HERNIA REPAIR VENTRAL ADULT;  Surgeon: Jules Husbands, MD;  Location: [facility] ORS;  Service: General;  Laterality: N/A;    Medical History: Past Medical History:  Diagnosis Date  . Anemia   . Aneurysm ([place])   . Arthritis    RHEUMATOID ARTHRITIS  . Asthma   . Collagen vascular disease ([place])   . COPD (chronic obstructive pulmonary disease) ([place])   . Coronary artery disease   . Diabetes mellitus without complication ([place])   . Edema    FEET/LEGS  . GERD (gastroesophageal reflux disease)   . Gout   . H/O wheezing   . History of hiatal hernia   . Hypertension   . Hypothyroidism   . Neuropathy   . Oxygen deficiency    HS  . Peripheral vascular disease ([place])   . Sarcoidosis of lung ([place])   . Seizures ([place])    WITH BRAIN 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   . Sleep apnea    OXYGEN AT NIGHT 3 L [place]    Family History: Family History  Problem Relation Age of Onset  . Heart disease Mother   . Hypertension Mother   . Diabetes Mother   . Diabetes Father   . Heart disease Father   . Hypertension Father   . Breast cancer Maternal  Aunt     Social History   Socioeconomic History  . Marital status: Single    Spouse name: Not on file  . Number of children: Not on file  . Years of education: Not on file  . Highest education level: Not on file  Occupational History  . Not on file  Tobacco Use  . Smoking status: Former Smoker    Packs/day: 1.00    Years: 30.00    Pack years: 30.00  Types: Cigarettes    Quit date: 01/27/2006    Years since quitting: 14.3  . Smokeless tobacco: Never Used  . Tobacco comment: quit   Substance and Sexual Activity  . Alcohol use: No    Alcohol/week: 0.0 standard drinks  . Drug use: No  . Sexual activity: Not on file  Other Topics Concern  . Not on file  Social History Narrative  . Not on file   Social Determinants of Health   Financial Resource Strain:   . Difficulty of Paying Living Expenses:   Food Insecurity:   . Worried About Charity fundraiser in the Last Year:   . Arboriculturist in the Last Year:   Transportation Needs:   . Film/video editor (Medical):   Marland Kitchen Lack of Transportation (Non-Medical):   Physical Activity:   . Days of Exercise per Week:   . Minutes of Exercise per Session:   Stress:   . Feeling of Stress :   Social Connections:   . Frequency of Communication with Friends and Family:   . Frequency of Social Gatherings with Friends and Family:   . Attends Religious Services:   . Active Member of Clubs or Organizations:   . Attends Archivist Meetings:   Marland Kitchen Marital Status:   Intimate Partner Violence:   . Fear of Current or Ex-Partner:   . Emotionally Abused:   Marland Kitchen Physically Abused:   . Sexually Abused:       Review of Systems  Constitutional: Negative for chills, fatigue and unexpected weight change.  HENT: Negative for congestion, rhinorrhea, sneezing and sore throat.   Eyes: Negative for photophobia, pain and redness.  Respiratory: Negative for cough, chest tightness and shortness of breath.   Cardiovascular: Negative for  chest pain and palpitations.  Gastrointestinal: Negative for abdominal pain, constipation, diarrhea, nausea and vomiting.  Endocrine: Negative.   Genitourinary: Negative for dysuria and frequency.  Musculoskeletal: Negative for arthralgias, back pain, joint swelling and neck pain.  Skin: Negative for rash.  Allergic/Immunologic: Negative.   Neurological: Negative for tremors and numbness.  Hematological: Negative for adenopathy. Does not bruise/bleed easily.  Psychiatric/Behavioral: Negative for behavioral problems and sleep disturbance. The patient is not nervous/anxious.     Vital Signs: BP 132/63   Pulse 60   Temp 97.6 F (36.4 C)   Resp 16   Ht 5' 3"  (1.6 m)   Wt 214 lb 6.4 oz (97.3 kg)   SpO2 96%   BMI 37.98 kg/m    Physical Exam Vitals and nursing note reviewed.  Constitutional:      General: She is not in acute distress.    Appearance: She is well-developed. She is not diaphoretic.  HENT:     Head: Normocephalic and atraumatic.     Mouth/Throat:     Pharynx: No oropharyngeal exudate.  Eyes:     Pupils: Pupils are equal, round, and reactive to light.  Neck:     Thyroid: No thyromegaly.     Vascular: No JVD.     Trachea: No tracheal deviation.  Cardiovascular:     Rate and Rhythm: Normal rate and regular rhythm.     Heart sounds: Normal heart sounds. No murmur heard.  No friction rub. No gallop.   Pulmonary:     Effort: Pulmonary effort is normal. No respiratory distress.     Breath sounds: Normal breath sounds. No wheezing or rales.  Chest:     Chest wall: No tenderness.  Abdominal:  Palpations: Abdomen is soft.     Tenderness: There is no abdominal tenderness. There is no guarding.  Musculoskeletal:        General: Normal range of motion.     Cervical back: Normal range of motion and neck supple.  Lymphadenopathy:     Cervical: No cervical adenopathy.  Skin:    General: Skin is warm and dry.  Neurological:     Mental Status: She is alert and  oriented to person, place, and time.     Cranial Nerves: No cranial nerve deficit.  Psychiatric:        Behavior: Behavior normal.        Thought Content: Thought content normal.        Judgment: Judgment normal.     Assessment/Plan: 1. Type 2 diabetes mellitus with hyperglycemia, with long-term current use of insulin (HCC) A21C is improving, continue to see endocrinology and follow their plan of care.  - POCT HgB A1C  2. Lower extremity edema ABI decreased in office, will get lower extremity US and follow up accordingly.  - [place] Lower Ext Art Bilat; Future - POCT ABI Screening Pilot No Charge  3. Essential hypertension, benign Controlled, continue current management.  4. BMI 37.0-37.9, adult Comorbidity include HTn, and DM.  Obesity Counseling: Risk Assessment: An assessment of behavioral risk factors was made today and includes lack of exercise sedentary lifestyle, lack of portion control and poor dietary habits.  Risk Modification Advice: She was counseled on portion control guidelines. Restricting daily caloric intake to 1800. The detrimental long term effects of obesity on her health and ongoing poor compliance was also discussed with the patient.    General Counseling: Lylliana verbalizes understanding of the findings of todays visit and agrees with plan of treatment. I have discussed any further diagnostic evaluation that may be needed or ordered today. We also reviewed her medications today. she has been encouraged to call the office with any questions or concerns that should arise related to todays visit.    Orders Placed This Encounter  Procedures  . [place] Lower Ext Art Bilat  . POCT HgB A1C  . POCT ABI Screening Pilot No Charge    No orders of the defined types were placed in this encounter.   Time spent: 30 Minutes   This patient was seen by Orson Gear AGNP-C in Collaboration with Dr Lavera Guise as a part of collaborative care agreement     Kendell Bane  AGNP-C Internal medicine

## 2020-05-28 ENCOUNTER — Other Ambulatory Visit: Payer: Self-pay | Admitting: Internal Medicine

## 2020-05-28 DIAGNOSIS — I10 Essential (primary) hypertension: Secondary | ICD-10-CM

## 2020-06-12 ENCOUNTER — Other Ambulatory Visit: Payer: Self-pay | Admitting: Adult Health

## 2020-06-14 ENCOUNTER — Other Ambulatory Visit: Payer: Medicare Other

## 2020-06-17 ENCOUNTER — Ambulatory Visit: Payer: Medicare Other | Admitting: Adult Health

## 2020-06-19 ENCOUNTER — Ambulatory Visit: Payer: Medicare Other | Admitting: Adult Health

## 2020-06-24 ENCOUNTER — Ambulatory Visit: Payer: Medicare Other | Admitting: Podiatry

## 2020-06-26 ENCOUNTER — Other Ambulatory Visit: Payer: Self-pay | Admitting: Internal Medicine

## 2020-06-28 ENCOUNTER — Other Ambulatory Visit: Payer: Self-pay

## 2020-06-28 ENCOUNTER — Ambulatory Visit (INDEPENDENT_AMBULATORY_CARE_PROVIDER_SITE_OTHER): Payer: Medicare Other

## 2020-06-28 DIAGNOSIS — R600 Localized edema: Secondary | ICD-10-CM

## 2020-07-01 ENCOUNTER — Encounter: Payer: Self-pay | Admitting: Podiatry

## 2020-07-01 ENCOUNTER — Other Ambulatory Visit: Payer: Self-pay

## 2020-07-01 ENCOUNTER — Ambulatory Visit (INDEPENDENT_AMBULATORY_CARE_PROVIDER_SITE_OTHER): Payer: Medicare Other | Admitting: Podiatry

## 2020-07-01 DIAGNOSIS — B351 Tinea unguium: Secondary | ICD-10-CM

## 2020-07-01 DIAGNOSIS — E1159 Type 2 diabetes mellitus with other circulatory complications: Secondary | ICD-10-CM | POA: Diagnosis not present

## 2020-07-01 DIAGNOSIS — M79676 Pain in unspecified toe(s): Secondary | ICD-10-CM

## 2020-07-01 NOTE — Progress Notes (Signed)
This patient returns to my office for at risk foot care.  This patient requires this care by a professional since this patient will be at risk due to having  Type 2 diabetes and peripheral vascular disease.  .  This patient is unable to cut nails herself since the patient cannot reach her nails.These nails are painful walking and wearing shoes.  This patient presents for at risk foot care today.  General Appearance  Alert, conversant and in no acute stress.  Vascular  Dorsalis pedis and posterior tibial  pulses are barely  palpable  bilaterally.  Capillary return is within normal limits  bilaterally. Temperature is within normal limits  bilaterally.  Neurologic  Senn-Weinstein monofilament wire test within normal limits  bilaterally. Muscle power within normal limits bilaterally.  Nails Thick disfigured discolored nails with subungual debris  from hallux to fifth toes bilaterally. No evidence of bacterial infection or drainage bilaterally.  Orthopedic  No limitations of motion  feet .  No crepitus or effusions noted.  No bony pathology or digital deformities noted.  Pes planus and HAV  B/L.  DJD STJ left foot.  Skin  normotropic skin with no porokeratosis noted bilaterally.  No signs of infections or ulcers noted.   Pinch callus  B/l.  Onychomycosis  Pain in right toes  Pain in left toes  Consent was obtained for treatment procedures.   Mechanical debridement of nails 1-5  bilaterally performed with a nail nipper.  Filed with dremel without incident.    Return office visit    3 months                  Told patient to return for periodic foot care and evaluation due to potential at risk complications.   Gardiner Barefoot DPM

## 2020-07-02 ENCOUNTER — Telehealth: Payer: Self-pay

## 2020-07-02 NOTE — Telephone Encounter (Signed)
Confirmed office visit on 8/19

## 2020-07-04 ENCOUNTER — Other Ambulatory Visit: Payer: Self-pay

## 2020-07-04 ENCOUNTER — Ambulatory Visit (INDEPENDENT_AMBULATORY_CARE_PROVIDER_SITE_OTHER): Payer: Medicare Other | Admitting: Hospice and Palliative Medicine

## 2020-07-04 ENCOUNTER — Encounter: Payer: Self-pay | Admitting: Hospice and Palliative Medicine

## 2020-07-04 DIAGNOSIS — I10 Essential (primary) hypertension: Secondary | ICD-10-CM | POA: Diagnosis not present

## 2020-07-04 DIAGNOSIS — E1165 Type 2 diabetes mellitus with hyperglycemia: Secondary | ICD-10-CM | POA: Diagnosis not present

## 2020-07-04 DIAGNOSIS — I739 Peripheral vascular disease, unspecified: Secondary | ICD-10-CM | POA: Diagnosis not present

## 2020-07-04 DIAGNOSIS — Z6838 Body mass index (BMI) 38.0-38.9, adult: Secondary | ICD-10-CM | POA: Diagnosis not present

## 2020-07-04 DIAGNOSIS — Z794 Long term (current) use of insulin: Secondary | ICD-10-CM

## 2020-07-04 MED ORDER — CILOSTAZOL 50 MG PO TABS: 50.0000 mg | ORAL_TABLET | Freq: Two times a day (BID) | ORAL | 0 refills | Status: DC

## 2020-07-04 NOTE — Progress Notes (Signed)
[facility] [place], [place] [address]  Internal MEDICINE  Office Visit Note  Patient Name: Chelsey [name]  [id number]  [id number]  Date of Service: 07/04/2020  Chief Complaint  Patient presents with  . Follow-up    ultrasound results  . Quality Metric Gaps    Hep C. pt had both covid vaccines will call with dates    HPI Patient is here for routine follow-up. Here to discuss her results from recent US. Renal US was stable from previous exam, 1.7 cm cystic lesion on right kidney. Worsening renal functions, was restarted on Metformin by endocrinologist. 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 For DM she is currently taking Metformin, u-500 Humulin R, as well as Trulicity BP slightly elevated today, recheck with manual cuff remains the same. Denies chest pain, shortness of breath or palpitations.  Current Medication: Outpatient Encounter Medications as of 07/04/2020  Medication Sig Note  . albuterol (PROAIR HFA) 108 (90 Base) MCG/ACT inhaler Inhale 2 puffs into the lungs every 6 (six) hours as needed for wheezing or shortness of breath.   . allopurinol (ZYLOPRIM) 300 MG tablet Take 1 tablet (300 mg total) by mouth daily.   Marland Kitchen amLODipine (NORVASC) 10 MG tablet TAKE 1 TABLET BY MOUTH DAILY   . aspirin EC 81 MG tablet Take 81 mg by mouth daily.   Marland Kitchen atorvastatin (LIPITOR) 10 MG tablet TAKE 1 TABLET BY MOUTH DAILY FOR CHOLESTEROL   . Blood Glucose Monitoring Suppl (ACCU-CHEK AVIVA PLUS) W/DEVICE KIT Use as directed. 12/20/2013: Received from: External Pharmacy  . colchicine 0.6 MG tablet TAKE 1 TO 2 TABLETS BY MOUTH DAILY AS NEEDED FOR GOUT FLARE **ONLY TAKE WITH GOUT FLARE**   . dapagliflozin propanediol (FARXIGA) 10 MG TABS tablet Take 10 mg by mouth daily. Take 1 tablet daily   . Dulaglutide (TRULICITY) 3 AJ/6.8TL SOPN  Inject 3 mg into the skin once a week. (Patient taking differently: Inject 4 mg into the skin once a week. )   . fluticasone (FLONASE) 50 MCG/ACT nasal spray Frequency:QD   Dosage:50   MCG  Instructions:  Note:2 sprays each nostril daily Dose: 50MCG 08/21/2016: Received from: [facility]  . Fluticasone-Salmeterol (ADVAIR) 250-50 MCG/DOSE AEPB Inhale 1 puff into the lungs 2 (two) times daily.   Marland Kitchen gabapentin (NEURONTIN) 300 MG capsule Take 300 mg by mouth 3 (three) times daily.   Marland Kitchen glucose blood test strip Test sugar 4 x aday for uncontrolled dm on insulin E11.22   . hydrALAZINE (APRESOLINE) 10 MG tablet TAKE 1 TABLET BY MOUTH 3 TIMES A DAY   . hydroxychloroquine (PLAQUENIL) 200 MG tablet TAKE 1 TABLET BY MOUTH TWICE A DAY   . insulin regular human CONCENTRATED (HUMULIN R U-500 KWIKPEN) 500 UNIT/ML kwikpen Take insulin 60 units three times a day   . LANCETS ULTRA THIN MISC Apply 1 each topically daily.  09/20/2013: Received from: External Pharmacy  . levothyroxine (SYNTHROID) 50 MCG tablet TAKE 1 TABLET BY MOUTH IN THE MORNING ONAN EMPTY STOMACH FOR UNDERACTIVE THYROID   . linaclotide (LINZESS) 145 MCG CAPS capsule Take 1 capsule (145 mcg total) by mouth daily.   Marland Kitchen losartan (COZAAR) 50 MG tablet Take 1 tablet (50 mg total) by mouth daily. For htn   . metFORMIN (GLUCOPHAGE) 500 MG tablet Take 1 tablet by mouth 2 (two) times daily.   Marland Kitchen  omeprazole (PRILOSEC) 20 MG capsule TAKE 1 CAPSULE BY MOUTH DAILY   . OXYGEN Inhale 3 L into the lungs. Nighttime use   . polyethylene glycol (MIRALAX / GLYCOLAX) packet Take 17 g by mouth daily.   Marland Kitchen tretinoin (RETIN-A) 0.025 % cream Apply topically.   . triamcinolone cream (KENALOG) 0.1 % Apply twice a day to affected areas of dry itchy skin until clear   . ULTICARE SHORT PEN NEEDLES 31G X 8 MM MISC USE 3 TIMES A DAY   . cilostazol (PLETAL) 50 MG tablet Take 1 tablet (50 mg total) by mouth 2 (two) times daily.    Facility-Administered Encounter Medications as of  07/04/2020  Medication  . betamethasone acetate-betamethasone sodium phosphate (CELESTONE) injection 3 mg    Surgical History: Past Surgical History:  Procedure Laterality Date  . BRAIN SURGERY  1990's  . CATARACT EXTRACTION W/PHACO Left 06/11/2016   Procedure: CATARACT EXTRACTION PHACO AND INTRAOCULAR LENS PLACEMENT (IOC);  Surgeon: Birder Robson, MD;  Location: [facility] ORS;  Service: Ophthalmology;  Laterality: Left;  [place] 1.01 AP% 21.6 CDE 13.23 Fluid pack lot # [id number]  . CEREBRAL ANEURYSM REPAIR  1990's   COILS  . CORONARY ARTERY BYPASS GRAFT    . EYE SURGERY  2000's   bilateral cataract  . FRACTURE SURGERY    . HERNIA REPAIR  2000   ventral  . STENT PLACEMENT VASCULAR ([place] [facility])    . VEIN BYPASS SURGERY    . VENTRAL HERNIA REPAIR N/A 04/29/2016   Procedure: Laparoscopic HERNIA REPAIR VENTRAL ADULT;  Surgeon: Jules Husbands, MD;  Location: [facility] ORS;  Service: General;  Laterality: N/A;    Medical History: Past Medical History:  Diagnosis Date  . Anemia   . Aneurysm ([place])   . Arthritis    RHEUMATOID ARTHRITIS  . Asthma   . Collagen vascular disease ([place])   . COPD (chronic obstructive pulmonary disease) ([place])   . Coronary artery disease   . Diabetes mellitus without complication ([place])   . Edema    FEET/LEGS  . GERD (gastroesophageal reflux disease)   . Gout   . H/O wheezing   . History of hiatal hernia   . Hypertension   . Hypothyroidism   . Neuropathy   . Oxygen deficiency    HS  . Peripheral vascular disease ([place])   . Sarcoidosis of lung ([place])   . Seizures ([place])    WITH BRAIN 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   . Sleep apnea    OXYGEN AT NIGHT 3 L [place]    Family History: Family History  Problem Relation Age of Onset  . Heart disease Mother   . Hypertension Mother   . Diabetes Mother   . Diabetes Father   . Heart disease Father   . Hypertension Father   . Breast cancer Maternal Aunt     Social History   Socioeconomic History  . Marital status: Single     Spouse name: Not on file  . Number of children: Not on file  . Years of education: Not on file  . Highest education level: Not on file  Occupational History  . Not on file  Tobacco Use  . Smoking status: Former Smoker    Packs/day: 1.00    Years: 30.00    Pack years: 30.00    Types: Cigarettes    Quit date: 01/27/2006    Years since quitting: 14.4  . Smokeless tobacco: Never Used  . Tobacco comment: quit   Substance and Sexual Activity  .  Alcohol use: No    Alcohol/week: 0.0 standard drinks  . Drug use: No  . Sexual activity: Not on file  Other Topics Concern  . Not on file  Social History Narrative  . Not on file   Social Determinants of Health   Financial Resource Strain:   . Difficulty of Paying Living Expenses: Not on file  Food Insecurity:   . Worried About Charity fundraiser in the Last Year: Not on file  . Ran Out of Food in the Last Year: Not on file  Transportation Needs:   . Lack of Transportation (Medical): Not on file  . Lack of Transportation (Non-Medical): Not on file  Physical Activity:   . Days of Exercise per Week: Not on file  . Minutes of Exercise per Session: Not on file  Stress:   . Feeling of Stress : Not on file  Social Connections:   . Frequency of Communication with Friends and Family: Not on file  . Frequency of Social Gatherings with Friends and Family: Not on file  . Attends Religious Services: Not on file  . Active Member of Clubs or Organizations: Not on file  . Attends Archivist Meetings: Not on file  . Marital Status: Not on file  Intimate Partner Violence:   . Fear of Current or Ex-Partner: Not on file  . Emotionally Abused: Not on file  . Physically Abused: Not on file  . Sexually Abused: Not on file      Review of Systems  Constitutional: Positive for fatigue. Negative for chills and diaphoresis.  HENT: Negative for congestion, sinus pressure and sore throat.   Eyes: Negative for photophobia and visual  disturbance.  Respiratory: Negative for cough, chest tightness, shortness of breath and wheezing.   Cardiovascular: Positive for leg swelling. Negative for chest pain and palpitations.  Gastrointestinal: Negative for abdominal pain, constipation, diarrhea, nausea and vomiting.  Genitourinary: Negative for dysuria and flank pain.  Musculoskeletal: Positive for arthralgias. Negative for back pain, gait problem and neck pain.  Skin: Negative for color change.  Allergic/Immunologic: Negative for environmental allergies and food allergies.  Neurological: Negative for dizziness and headaches.  Hematological: Does not bruise/bleed easily.  Psychiatric/Behavioral: Negative for agitation, behavioral problems (depression) and hallucinations.    Vital Signs: BP 140/72   Pulse 78   Temp (!) 97.2 F (36.2 C)   Resp 16   Ht 5' 3"  (1.6 m)   Wt 215 lb 9.6 oz (97.8 kg)   SpO2 95%   BMI 38.19 kg/m    Physical Exam Vitals reviewed.  Constitutional:      Appearance: Normal appearance. She is obese.  HENT:     Mouth/Throat:     Mouth: Mucous membranes are moist.     Pharynx: Oropharynx is clear.  Cardiovascular:     Rate and Rhythm: Normal rate and regular rhythm.     Pulses:          Popliteal pulses are 2+ on the right side and 2+ on the left side.       Dorsalis pedis pulses are 2+ on the right side and 2+ on the left side.       Posterior tibial pulses are 1+ on the right side and 1+ on the left side.     Heart sounds: Normal heart sounds.  Pulmonary:     Effort: Pulmonary effort is normal.     Breath sounds: Normal breath sounds.  Abdominal:     General: Abdomen  is flat. Bowel sounds are normal.  Musculoskeletal:        General: Normal range of motion.     Cervical back: Normal range of motion.     Right lower leg: 1+ Pitting Edema present.     Left lower leg: 1+ Pitting Edema present.  Skin:    General: Skin is warm.  Neurological:     General: No focal deficit present.      Mental Status: She is alert. Mental status is at baseline.  Psychiatric:        Mood and Affect: Mood normal.        Behavior: Behavior normal.        Thought Content: Thought content normal.    Assessment/Plan: 1. Peripheral vascular disease ([place]) Vascular disease found on [place], pitting edema to bilateral lower extremities. Start pletal for thrombus prevention. Pending carotid US results will need to be seen by vascular surgery to further manage vascular disease.  - US Carotid Duplex Bilateral; Future - cilostazol (PLETAL) 50 MG tablet; Take 1 tablet (50 mg total) by mouth 2 (two) times daily.  Dispense: 60 tablet; Refill: 0  2. Type 2 diabetes mellitus with hyperglycemia, with long-term current use of insulin ([place]) Closely followed by endocrinologist. Will need to stop Metformin due to worsening kidney function and declining GFR. Advised to discuss with endocrinologist at next appointment in 1 week.  3. Essential hypertension, benign Slight elevation in BP today. Discussed importance of medication compliance and routinely checking her BP at home. Hypertension Counseling:  The following hypertensive lifestyle modification were recommended and discussed:  1. Limiting alcohol intake to less than 1 oz/day of ethanol:(24 oz of beer or 8 oz of wine or 2 oz of 100-proof whiskey). 2. Take baby ASA 81 mg daily. 3. Importance of regular aerobic exercise and losing weight. 4. Reduce dietary saturated fat and cholesterol intake for overall cardiovascular health. 5. Maintaining adequate dietary potassium, calcium, and magnesium intake. 6. Regular monitoring of the blood pressure. 7. Reduce sodium intake to less than 100 mmol/day (less than 2.3 gm of sodium or less than 6 gm of sodium choride)   4. BMI 38.0-38.9,adult Discussed importance of incorporating healthy diet choices into her meals as well as daily exercise, such as walking in her neighborhood. Comorbidities to her obesity being hypertension  as well as Type 2 diabetes. Obesity Counseling: Risk Assessment: An assessment of behavioral risk factors was made today and includes lack of exercise sedentary lifestyle, lack of portion control and poor dietary habits.  Risk Modification Advice: She was counseled on portion control guidelines. Restricting daily caloric intake to 1500. The detrimental long term effects of obesity on her health and ongoing poor compliance was also discussed with the patient.  General Counseling: Brynja verbalizes understanding of the findings of todays visit and agrees with plan of treatment. I have discussed any further diagnostic evaluation that may be needed or ordered today. We also reviewed her medications today. she has been encouraged to call the office with any questions or concerns that should arise related to todays visit.  Orders Placed This Encounter  Procedures  . US Carotid Duplex Bilateral    Meds ordered this encounter  Medications  . cilostazol (PLETAL) 50 MG tablet    Sig: Take 1 tablet (50 mg total) by mouth 2 (two) times daily.    Dispense:  60 tablet    Refill:  0    Time spent: 30 Minutes   This patient was seen by  Theodoro Grist AGNP-C in Collaboration with Dr Lavera Guise as a part of collaborative care agreement     Tanna Furry. Laketra Bowdish AGNP-C Internal medicine

## 2020-07-09 ENCOUNTER — Other Ambulatory Visit: Payer: Self-pay | Admitting: Adult Health

## 2020-07-29 ENCOUNTER — Telehealth: Payer: Self-pay

## 2020-07-29 NOTE — Telephone Encounter (Signed)
Confirmed pt appt 07/31/20 

## 2020-07-31 ENCOUNTER — Other Ambulatory Visit: Payer: Self-pay

## 2020-07-31 ENCOUNTER — Ambulatory Visit (INDEPENDENT_AMBULATORY_CARE_PROVIDER_SITE_OTHER): Payer: Medicare Other | Admitting: Internal Medicine

## 2020-07-31 ENCOUNTER — Encounter: Payer: Self-pay | Admitting: Internal Medicine

## 2020-07-31 DIAGNOSIS — R300 Dysuria: Secondary | ICD-10-CM | POA: Diagnosis not present

## 2020-07-31 DIAGNOSIS — I739 Peripheral vascular disease, unspecified: Secondary | ICD-10-CM

## 2020-07-31 DIAGNOSIS — Z794 Long term (current) use of insulin: Secondary | ICD-10-CM

## 2020-07-31 DIAGNOSIS — H1132 Conjunctival hemorrhage, left eye: Secondary | ICD-10-CM

## 2020-07-31 DIAGNOSIS — R102 Pelvic and perineal pain: Secondary | ICD-10-CM

## 2020-07-31 DIAGNOSIS — K219 Gastro-esophageal reflux disease without esophagitis: Secondary | ICD-10-CM

## 2020-07-31 DIAGNOSIS — E1165 Type 2 diabetes mellitus with hyperglycemia: Secondary | ICD-10-CM | POA: Diagnosis not present

## 2020-07-31 LAB — POCT URINALYSIS DIPSTICK
Bilirubin, UA: NEGATIVE
Blood, UA: NEGATIVE
Glucose, UA: POSITIVE — AB
Ketones, UA: NEGATIVE
Leukocytes, UA: NEGATIVE
Nitrite, UA: NEGATIVE
Protein, UA: POSITIVE — AB
Spec Grav, UA: 1.01 (ref 1.010–1.025)
Urobilinogen, UA: 0.2 E.U./dL
pH, UA: 5 (ref 5.0–8.0)

## 2020-07-31 MED ORDER — DAPAGLIFLOZIN PROPANEDIOL 10 MG PO TABS: 10.0000 mg | ORAL_TABLET | Freq: Every day | ORAL | 3 refills | Status: DC

## 2020-07-31 MED ORDER — PREMARIN 0.625 MG/GM VA CREA: 1.0000 | TOPICAL_CREAM | VAGINAL | 12 refills | Status: DC

## 2020-07-31 MED ORDER — FLUCONAZOLE 150 MG PO TABS: ORAL_TABLET | ORAL | 0 refills | Status: DC

## 2020-07-31 MED ORDER — OMEPRAZOLE 20 MG PO CPDR: DELAYED_RELEASE_CAPSULE | ORAL | 2 refills | Status: DC

## 2020-07-31 NOTE — Progress Notes (Signed)
[facility] [place], [place] [address]  Internal MEDICINE  Office Visit Note  Patient Name: Chelsey [name]  [id number]  [id number]  Date of Service: 08/02/2020  Chief Complaint  Patient presents with  . Urinary Tract Infection    burning after and is sore, swelling on vigina, went to [facility] several weeks ago with same symptoms took antibiotics and still not better  . Quality Metric Gaps    colonoscopy, flu vacc  . Tinnitus    roaring sound x 2 weeks  . Eye Problem    busted blood vessel in left eye    HPI Pt is here with few complaints 1. Vaginal irritation and pain, has been sexually active, went to [facility] and was cleared for STD. She was treated with Flagyl at this time. Denies any discharge but itching and burning is present. 2. She also has an area of redness in her left eye, does take Pletal and AS. Denies any trauma, it is not painfu, denies any discharge from her eye 3. Pt continues to see multiple provider for her diabetes. There is some conflict in her meds who started what diabetic meds. She is on Trulicity and wants to be on Farxiga, does not think she gets fungal vaginal infection 4. Discussed about seeing one PCP 5. C/O increased heart burn  6.Recent renal CT scan showed Hyperdense 1.7 cm lesion within the upper pole of the right kidney which has grown since the prior exam, however likely represents proteinaceous/complex renal cyst 7. Recent ABI arterial studies ( non invasive) showed mild reduction in flow , continues to have some pain with walking, she has sedentary life style  Current Medication:  Outpatient Encounter Medications as of 07/31/2020  Medication Sig Note  . albuterol (PROAIR HFA) 108 (90 Base) MCG/ACT inhaler Inhale 2 puffs into the lungs every 6 (six) hours as needed for wheezing or shortness of breath.   . allopurinol (ZYLOPRIM) 300 MG tablet Take 1 tablet (300 mg total) by mouth daily.   Marland Kitchen amLODipine (NORVASC) 10 MG tablet TAKE  1 TABLET BY MOUTH DAILY   . aspirin EC 81 MG tablet Take 81 mg by mouth daily.   Marland Kitchen atorvastatin (LIPITOR) 10 MG tablet TAKE 1 TABLET BY MOUTH DAILY FOR CHOLESTEROL   . Blood Glucose Monitoring Suppl (ACCU-CHEK AVIVA PLUS) W/DEVICE KIT Use as directed. 12/20/2013: Received from: External Pharmacy  . cilostazol (PLETAL) 50 MG tablet Take 1 tablet (50 mg total) by mouth 2 (two) times daily.   . colchicine 0.6 MG tablet TAKE 1 TO 2 TABLETS BY MOUTH DAILY AS NEEDED FOR GOUT FLARE **ONLY TAKE WITH GOUT FLARE**   . dapagliflozin propanediol (FARXIGA) 10 MG TABS tablet Take 1 tablet (10 mg total) by mouth daily. Take 1 tablet daily   . Dulaglutide (TRULICITY) 3 GN/5.6OZ SOPN Inject 3 mg into the skin once a week. (Patient taking differently: Inject 4 mg into the skin once a week. )   . fluticasone (FLONASE) 50 MCG/ACT nasal spray Frequency:QD   Dosage:50   MCG  Instructions:  Note:2 sprays each nostril daily Dose: 50MCG 08/21/2016: Received from: [facility]  . Fluticasone-Salmeterol (ADVAIR) 250-50 MCG/DOSE AEPB Inhale 1 puff into the lungs 2 (two) times daily.   Marland Kitchen gabapentin (NEURONTIN) 300 MG capsule Take 300 mg by mouth 3 (three) times daily.   Marland Kitchen glucose blood test strip Test sugar 4 x aday for uncontrolled dm on insulin E11.22   . hydrALAZINE (APRESOLINE) 10 MG tablet  TAKE 1 TABLET BY MOUTH 3 TIMES A DAY   . hydroxychloroquine (PLAQUENIL) 200 MG tablet TAKE 1 TABLET BY MOUTH TWICE A DAY   . insulin regular human CONCENTRATED (HUMULIN R U-500 KWIKPEN) 500 UNIT/ML kwikpen Take insulin 60 units three times a day   . LANCETS ULTRA THIN MISC Apply 1 each topically daily.  09/20/2013: Received from: External Pharmacy  . levothyroxine (SYNTHROID) 50 MCG tablet TAKE 1 TABLET BY MOUTH IN THE MORNING ONAN EMPTY STOMACH FOR UNDERACTIVE THYROID   . linaclotide (LINZESS) 145 MCG CAPS capsule Take 1 capsule (145 mcg total) by mouth daily.   Marland Kitchen losartan (COZAAR) 50 MG tablet Take 1 tablet (50 mg total) by mouth  daily. For htn   . omeprazole (PRILOSEC) 20 MG capsule Take one tab po bid for GERD   . OXYGEN Inhale 3 L into the lungs. Nighttime use   . polyethylene glycol (MIRALAX / GLYCOLAX) packet Take 17 g by mouth daily.   Marland Kitchen tretinoin (RETIN-A) 0.025 % cream Apply topically.   . triamcinolone cream (KENALOG) 0.1 % Apply twice a day to affected areas of dry itchy skin until clear   . ULTICARE SHORT PEN NEEDLES 31G X 8 MM MISC USE 3 TIMES A DAY   . [DISCONTINUED] dapagliflozin propanediol (FARXIGA) 10 MG TABS tablet Take 10 mg by mouth daily. Take 1 tablet daily   . [DISCONTINUED] metFORMIN (GLUCOPHAGE) 500 MG tablet Take 1 tablet by mouth 2 (two) times daily.   . [DISCONTINUED] omeprazole (PRILOSEC) 20 MG capsule TAKE 1 CAPSULE BY MOUTH DAILY   . conjugated estrogens (PREMARIN) vaginal cream Place 1 Applicatorful vaginally 2 (two) times a week.   . fluconazole (DIFLUCAN) 150 MG tablet Take one tab po qd for 5 days for yeast infection    Facility-Administered Encounter Medications as of 07/31/2020  Medication  . betamethasone acetate-betamethasone sodium phosphate (CELESTONE) injection 3 mg    Surgical History: Past Surgical History:  Procedure Laterality Date  . BRAIN SURGERY  1990's  . CATARACT EXTRACTION W/PHACO Left 06/11/2016   Procedure: CATARACT EXTRACTION PHACO AND INTRAOCULAR LENS PLACEMENT (IOC);  Surgeon: Birder Robson, MD;  Location: [facility] ORS;  Service: Ophthalmology;  Laterality: Left;  [place] 1.01 AP% 21.6 CDE 13.23 Fluid pack lot # [id number]  . CEREBRAL ANEURYSM REPAIR  1990's   COILS  . CORONARY ARTERY BYPASS GRAFT    . EYE SURGERY  2000's   bilateral cataract  . FRACTURE SURGERY    . HERNIA REPAIR  2000   ventral  . STENT PLACEMENT VASCULAR ([place] HX)    . VEIN BYPASS SURGERY    . VENTRAL HERNIA REPAIR N/A 04/29/2016   Procedure: Laparoscopic HERNIA REPAIR VENTRAL ADULT;  Surgeon: Jules Husbands, MD;  Location: [facility] ORS;  Service: General;  Laterality: N/A;    Medical  History: Past Medical History:  Diagnosis Date  . Anemia   . Aneurysm ([place])   . Arthritis    RHEUMATOID ARTHRITIS  . Asthma   . Collagen vascular disease ([place])   . COPD (chronic obstructive pulmonary disease) ([place])   . Coronary artery disease   . Diabetes mellitus without complication ([place])   . Edema    FEET/LEGS  . GERD (gastroesophageal reflux disease)   . Gout   . H/O wheezing   . History of hiatal hernia   . Hypertension   . Hypothyroidism   . Neuropathy   . Oxygen deficiency    HS  . Peripheral vascular disease ([place])   .  Sarcoidosis of lung ([place])   . Seizures ([place])    WITH BRAIN 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   . Sleep apnea    OXYGEN AT NIGHT 3 L [place]    Family History: Family History  Problem Relation Age of Onset  . Heart disease Mother   . Hypertension Mother   . Diabetes Mother   . Diabetes Father   . Heart disease Father   . Hypertension Father   . Breast cancer Maternal Aunt     Social History   Socioeconomic History  . Marital status: Single    Spouse name: Not on file  . Number of children: Not on file  . Years of education: Not on file  . Highest education level: Not on file  Occupational History  . Not on file  Tobacco Use  . Smoking status: Former Smoker    Packs/day: 1.00    Years: 30.00    Pack years: 30.00    Types: Cigarettes    Quit date: 01/27/2006    Years since quitting: 14.5  . Smokeless tobacco: Never Used  . Tobacco comment: quit   Substance and Sexual Activity  . Alcohol use: No    Alcohol/week: 0.0 standard drinks  . Drug use: No  . Sexual activity: Not on file  Other Topics Concern  . Not on file  Social History Narrative  . Not on file   Social Determinants of Health   Financial Resource Strain:   . Difficulty of Paying Living Expenses: Not on file  Food Insecurity:   . Worried About Charity fundraiser in the Last Year: Not on file  . Ran Out of Food in the Last Year: Not on file  Transportation Needs:   . Lack  of Transportation (Medical): Not on file  . Lack of Transportation (Non-Medical): Not on file  Physical Activity:   . Days of Exercise per Week: Not on file  . Minutes of Exercise per Session: Not on file  Stress:   . Feeling of Stress : Not on file  Social Connections:   . Frequency of Communication with Friends and Family: Not on file  . Frequency of Social Gatherings with Friends and Family: Not on file  . Attends Religious Services: Not on file  . Active Member of Clubs or Organizations: Not on file  . Attends Archivist Meetings: Not on file  . Marital Status: Not on file  Intimate Partner Violence:   . Fear of Current or Ex-Partner: Not on file  . Emotionally Abused: Not on file  . Physically Abused: Not on file  . Sexually Abused: Not on file     Review of Systems  HENT: Positive for sinus pressure.   Eyes: Positive for redness.  Respiratory: Negative.  Negative for choking.   Cardiovascular: Negative for chest pain and leg swelling.  Gastrointestinal: Negative for diarrhea.  Endocrine:       Elevated blood sugars   Genitourinary: Positive for dyspareunia and vaginal pain.  Musculoskeletal: Positive for arthralgias.  Neurological: Negative.   Hematological: Negative.   Psychiatric/Behavioral: Positive for sleep disturbance. The patient is nervous/anxious. The patient is not hyperactive.     Vital Signs: BP 130/70   Pulse 97   Temp (!) 97 F (36.1 C)   Resp 16   Ht 5' 5" (1.651 m)   Wt 215 lb (97.5 kg)   SpO2 95%   BMI 35.78 kg/m    Physical Exam Constitutional:  Appearance: Normal appearance. She is obese.  HENT:     Head: Normocephalic and atraumatic.     Right Ear: Tympanic membrane normal.     Left Ear: Tympanic membrane normal.     Mouth/Throat:     Mouth: Mucous membranes are moist.  Eyes:     Extraocular Movements: Extraocular movements intact.     Pupils: Pupils are equal, round, and reactive to light.     Comments: Left  subconjunctival hemorrhage   Cardiovascular:     Rate and Rhythm: Normal rate and regular rhythm.  Pulmonary:     Effort: Pulmonary effort is normal.     Breath sounds: Normal breath sounds.  Skin:    General: Skin is warm and dry.  Neurological:     Mental Status: She is alert and oriented to person, place, and time.    Assessment/Plan: 1. Subconjunctival bleed, left Pt is instructed to watch, no active bleeding. Try not to rub or apply pressure   2. Type 2 diabetes mellitus with hyperglycemia, with long-term current use of insulin (HCC) Continue on U 500 insulin along with Trulicity, there seems to be some confusion with he rprescription of metformin and Farxiga, ( (CKD)  - dapagliflozin propanediol (FARXIGA) 10 MG TABS tablet; Take 1 tablet (10 mg total) by mouth daily. Take 1 tablet daily  Dispense: 30 tablet; Refill: 3  3. Vaginal pain Multifactorial, might need pelvic u/s or direct visualization, since she is sexually active, can be due to local trauma willa dd low dose I/V  - fluconazole (DIFLUCAN) 150 MG tablet; Take one tab po qd for 5 days for yeast infection  Dispense: 1 tablet; Refill: 0 - conjugated estrogens (PREMARIN) vaginal cream; Place 1 Applicatorful vaginally 2 (two) times a week.  Dispense: 42.5 g; Refill: 12  4. PVD (peripheral vascular disease) with claudication (HCC) Continue Pletal for now   5. GERD without esophagitis Increase omeparzole to bid  - omeprazole (PRILOSEC) 20 MG capsule; Take one tab po bid for GERD  Dispense: 180 capsule; Refill: 2  6. Dysuria - POCT Urinalysis Dipstick  General Counseling: Breely verbalizes understanding of the findings of todays visit and agrees with plan of treatment. I have discussed any further diagnostic evaluation that may be needed or ordered today. We also reviewed her medications today. she has been encouraged to call the office with any questions or concerns that should arise related to todays visit.  Orders Placed  This Encounter  Procedures  . POCT Urinalysis Dipstick    Meds ordered this encounter  Medications  . fluconazole (DIFLUCAN) 150 MG tablet    Sig: Take one tab po qd for 5 days for yeast infection    Dispense:  1 tablet    Refill:  0  . conjugated estrogens (PREMARIN) vaginal cream    Sig: Place 1 Applicatorful vaginally 2 (two) times a week.    Dispense:  42.5 g    Refill:  12  . dapagliflozin propanediol (FARXIGA) 10 MG TABS tablet    Sig: Take 1 tablet (10 mg total) by mouth daily. Take 1 tablet daily    Dispense:  30 tablet    Refill:  3  . omeprazole (PRILOSEC) 20 MG capsule    Sig: Take one tab po bid for GERD    Dispense:  180 capsule    Refill:  2    Total time spent:30 Minutes Time spent includes review of chart, medications, test results, and follow up plan with the patient.  Dr Lavera Guise Internal medicine

## 2020-08-16 ENCOUNTER — Ambulatory Visit (INDEPENDENT_AMBULATORY_CARE_PROVIDER_SITE_OTHER): Payer: Medicare Other

## 2020-08-16 ENCOUNTER — Other Ambulatory Visit: Payer: Self-pay

## 2020-08-16 DIAGNOSIS — I739 Peripheral vascular disease, unspecified: Secondary | ICD-10-CM | POA: Diagnosis not present

## 2020-08-16 DIAGNOSIS — I6523 Occlusion and stenosis of bilateral carotid arteries: Secondary | ICD-10-CM

## 2020-08-20 ENCOUNTER — Other Ambulatory Visit: Payer: Self-pay

## 2020-08-20 ENCOUNTER — Encounter: Payer: Self-pay | Admitting: Hospice and Palliative Medicine

## 2020-08-20 ENCOUNTER — Ambulatory Visit (INDEPENDENT_AMBULATORY_CARE_PROVIDER_SITE_OTHER): Payer: Medicare Other | Admitting: Hospice and Palliative Medicine

## 2020-08-20 DIAGNOSIS — I10 Essential (primary) hypertension: Secondary | ICD-10-CM | POA: Diagnosis not present

## 2020-08-20 DIAGNOSIS — Z0001 Encounter for general adult medical examination with abnormal findings: Secondary | ICD-10-CM

## 2020-08-20 DIAGNOSIS — E118 Type 2 diabetes mellitus with unspecified complications: Secondary | ICD-10-CM | POA: Diagnosis not present

## 2020-08-20 DIAGNOSIS — Z1211 Encounter for screening for malignant neoplasm of colon: Secondary | ICD-10-CM

## 2020-08-20 DIAGNOSIS — R600 Localized edema: Secondary | ICD-10-CM | POA: Diagnosis not present

## 2020-08-20 DIAGNOSIS — N1831 Chronic kidney disease, stage 3a: Secondary | ICD-10-CM

## 2020-08-20 DIAGNOSIS — R300 Dysuria: Secondary | ICD-10-CM

## 2020-08-20 NOTE — Progress Notes (Signed)
[facility] [place], [place] [address]  Internal MEDICINE  Office Visit Note  Patient Name: Chelsey [name]  [id number]  [id number]  Date of Service: 08/23/2020  Chief Complaint  Patient presents with  . Medicare Wellness    reviewing [place]  . Diabetes  . Hypertension  . Quality Metric Gaps    HepC, colonoscopy     HPI Pt is here for routine health maintenance examination -Discussed her low BP today, denies dizziness or lightheadedness -Discussed results of renal US-stable 1.7 cm cystic lesion in upper right pole of kidney--already closely followed by nephrology Per Dr. Candiss Norse Chronic kidney disease stage IIIa -Underlying chronic kidney disease is likely multifactorial with contributing factors including diabetes, hypertension, atherosclerosis and age Increase in creatinine noted compared to December 2020. It has increased from 1.09-1.42 with resultant decrease in GFR from 60 to 43. Blood sugars have been relatively high. Recommended patient to focus on drinking more water and avoid drinking sodas, other sugared drinks. Patient is also noted on Farxiga. Will monitor renal function with close follow-up Continued on aspirin for cardiovascular risk reduction Patient is currently not on ACE inhibitor. Will consider starting it once renal function is stabilized   -Followed by endocrinology for her DM, recent changes in her medications, U-500 discontinued, start fast acting Humalog with meals and long acting Tresiba daily, last A1C 8.7 Has had diabetic foot and eye exam for this year  Followed by multiple providers within [facility] system-at risk of polypharmacy   Up to date on mammogram, not interested in colonoscopy but interested in ColoGuard testing after discussions-no history of first degree relatives with colon cancer  Current Medication: Outpatient Encounter Medications as of 08/20/2020  Medication Sig Note  . albuterol (PROAIR HFA) 108 (90 Base) MCG/ACT  inhaler Inhale 2 puffs into the lungs every 6 (six) hours as needed for wheezing or shortness of breath.   . allopurinol (ZYLOPRIM) 300 MG tablet Take 1 tablet (300 mg total) by mouth daily.   Marland Kitchen amLODipine (NORVASC) 10 MG tablet TAKE 1 TABLET BY MOUTH DAILY   . aspirin EC 81 MG tablet Take 81 mg by mouth daily.   Marland Kitchen atorvastatin (LIPITOR) 10 MG tablet TAKE 1 TABLET BY MOUTH DAILY FOR CHOLESTEROL   . Blood Glucose Monitoring Suppl (ACCU-CHEK AVIVA PLUS) W/DEVICE KIT Use as directed. 12/20/2013: Received from: External Pharmacy  . cilostazol (PLETAL) 50 MG tablet Take 1 tablet (50 mg total) by mouth 2 (two) times daily.   . colchicine 0.6 MG tablet TAKE 1 TO 2 TABLETS BY MOUTH DAILY AS NEEDED FOR GOUT FLARE **ONLY TAKE WITH GOUT FLARE**   . conjugated estrogens (PREMARIN) vaginal cream Place 1 Applicatorful vaginally 2 (two) times a week.   . dapagliflozin propanediol (FARXIGA) 10 MG TABS tablet Take 1 tablet (10 mg total) by mouth daily. Take 1 tablet daily   . Dulaglutide (TRULICITY) 3 LP/3.7TK SOPN Inject 3 mg into the skin once a week. (Patient taking differently: Inject 4 mg into the skin once a week. )   . fluticasone (FLONASE) 50 MCG/ACT nasal spray Frequency:QD   Dosage:50   MCG  Instructions:  Note:2 sprays each nostril daily Dose: 50MCG 08/21/2016: Received from: [facility]  . Fluticasone-Salmeterol (ADVAIR) 250-50 MCG/DOSE AEPB Inhale 1 puff into the lungs 2 (two) times daily.   Marland Kitchen gabapentin (NEURONTIN) 300 MG capsule Take 300 mg by mouth 3 (three) times daily.   Marland Kitchen glucose blood test strip Test sugar 4 x aday for  uncontrolled dm on insulin E11.22   . hydrALAZINE (APRESOLINE) 10 MG tablet TAKE 1 TABLET BY MOUTH 3 TIMES A DAY   . hydroxychloroquine (PLAQUENIL) 200 MG tablet TAKE 1 TABLET BY MOUTH TWICE A DAY   . insulin regular human CONCENTRATED (HUMULIN R U-500 KWIKPEN) 500 UNIT/ML kwikpen Take insulin 60 units three times a day   . LANCETS ULTRA THIN MISC Apply 1 each topically daily.   09/20/2013: Received from: External Pharmacy  . levothyroxine (SYNTHROID) 50 MCG tablet TAKE 1 TABLET BY MOUTH IN THE MORNING ONAN EMPTY STOMACH FOR UNDERACTIVE THYROID   . linaclotide (LINZESS) 145 MCG CAPS capsule Take 1 capsule (145 mcg total) by mouth daily.   Marland Kitchen losartan (COZAAR) 50 MG tablet Take 1 tablet (50 mg total) by mouth daily. For htn   . omeprazole (PRILOSEC) 20 MG capsule Take one tab po bid for GERD   . OXYGEN Inhale 3 L into the lungs. Nighttime use   . polyethylene glycol (MIRALAX / GLYCOLAX) packet Take 17 g by mouth daily.   Marland Kitchen ULTICARE SHORT PEN NEEDLES 31G X 8 MM MISC USE 3 TIMES A DAY   . [DISCONTINUED] fluconazole (DIFLUCAN) 150 MG tablet Take one tab po qd for 5 days for yeast infection   . [DISCONTINUED] tretinoin (RETIN-A) 0.025 % cream Apply topically.   . [DISCONTINUED] triamcinolone cream (KENALOG) 0.1 % Apply twice a day to affected areas of dry itchy skin until clear    Facility-Administered Encounter Medications as of 08/20/2020  Medication  . betamethasone acetate-betamethasone sodium phosphate (CELESTONE) injection 3 mg    Surgical History: Past Surgical History:  Procedure Laterality Date  . BRAIN SURGERY  1990's  . CATARACT EXTRACTION W/PHACO Left 06/11/2016   Procedure: CATARACT EXTRACTION PHACO AND INTRAOCULAR LENS PLACEMENT (IOC);  Surgeon: Birder Robson, MD;  Location: [facility] ORS;  Service: Ophthalmology;  Laterality: Left;  [place] 1.01 AP% 21.6 CDE 13.23 Fluid pack lot # [id number]  . CEREBRAL ANEURYSM REPAIR  1990's   COILS  . CORONARY ARTERY BYPASS GRAFT    . EYE SURGERY  2000's   bilateral cataract  . FRACTURE SURGERY    . HERNIA REPAIR  2000   ventral  . STENT PLACEMENT VASCULAR ([place] HX)    . VEIN BYPASS SURGERY    . VENTRAL HERNIA REPAIR N/A 04/29/2016   Procedure: Laparoscopic HERNIA REPAIR VENTRAL ADULT;  Surgeon: Jules Husbands, MD;  Location: [facility] ORS;  Service: General;  Laterality: N/A;    Medical History: Past Medical History:   Diagnosis Date  . Anemia   . Aneurysm ([place])   . Arthritis    RHEUMATOID ARTHRITIS  . Asthma   . Collagen vascular disease ([place])   . COPD (chronic obstructive pulmonary disease) ([place])   . Coronary artery disease   . Diabetes mellitus without complication ([place])   . Edema    FEET/LEGS  . GERD (gastroesophageal reflux disease)   . Gout   . H/O wheezing   . History of hiatal hernia   . Hypertension   . Hypothyroidism   . Neuropathy   . Oxygen deficiency    HS  . Peripheral vascular disease ([place])   . Sarcoidosis of lung ([place])   . 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 Sleep apnea    OXYGEN AT NIGHT 3 L [place]    Family History: Family History  Problem Relation Age of Onset  . Heart disease Mother   . Hypertension Mother   .  Diabetes Mother   . Diabetes Father   . Heart disease Father   . Hypertension Father   . Breast cancer Maternal Aunt     Review of Systems  Constitutional: Negative for chills, diaphoresis and fatigue.  HENT: Negative for ear pain, postnasal drip and sinus pressure.   Eyes: Negative for photophobia, discharge, redness, itching and visual disturbance.  Respiratory: Negative for cough, shortness of breath and wheezing.   Cardiovascular: Negative for chest pain, palpitations and leg swelling.  Gastrointestinal: Negative for abdominal pain, constipation, diarrhea, nausea and vomiting.  Genitourinary: Negative for dysuria and flank pain.  Musculoskeletal: Negative for arthralgias, back pain, gait problem and neck pain.  Skin: Negative for color change.  Allergic/Immunologic: Negative for environmental allergies and food allergies.  Neurological: Negative for dizziness and headaches.  Hematological: Does not bruise/bleed easily.  Psychiatric/Behavioral: Negative for agitation, behavioral problems (depression) and hallucinations.     Vital Signs: BP (!) 92/58   Pulse 77   Temp (!) 97.5 F (36.4 C)   Resp 16   Ht 5' 3"  (1.6 m)    Wt 211 lb 3.2 oz (95.8 kg)   SpO2 98%   BMI 37.41 kg/m    Physical Exam Vitals reviewed.  Constitutional:      Appearance: Normal appearance. She is obese.  HENT:     Mouth/Throat:     Mouth: Mucous membranes are moist.     Pharynx: Oropharynx is clear.  Cardiovascular:     Rate and Rhythm: Normal rate and regular rhythm.     Pulses: Normal pulses.     Heart sounds: Normal heart sounds.  Pulmonary:     Effort: Pulmonary effort is normal.     Breath sounds: Normal breath sounds.  Chest:     Breasts:        Right: Normal.        Left: Normal.  Abdominal:     General: Abdomen is flat.     Palpations: Abdomen is soft.  Musculoskeletal:        General: Normal range of motion.     Cervical back: Normal range of motion.  Skin:    General: Skin is warm.  Neurological:     General: No focal deficit present.     Mental Status: She is alert and oriented to person, place, and time. Mental status is at baseline.  Psychiatric:        Mood and Affect: Mood normal.        Behavior: Behavior normal.        Thought Content: Thought content normal.    LABS: Recent Results (from the past 2160 hour(s))  POCT Urinalysis Dipstick     Status: Abnormal   Collection Time: 07/31/20 10:52 AM  Result Value Ref Range   Color, UA     Clarity, UA     Glucose, UA Positive (A) Negative   Bilirubin, UA neg    Ketones, UA neg    Spec Grav, UA 1.010 1.010 - 1.025   Blood, UA neg    pH, UA 5.0 5.0 - 8.0   Protein, UA Positive (A) Negative   Urobilinogen, UA 0.2 0.2 or 1.0 E.U./dL   Nitrite, UA neg    Leukocytes, UA Negative Negative   Appearance     Odor    UA/M w/rflx Culture, Routine     Status: Abnormal   Collection Time: 08/20/20 12:48 PM   Specimen: Urine   Urine  Result Value Ref Range   Specific  Gravity, UA 1.010 1.005 - 1.030   pH, UA 5.0 5.0 - 7.5   Color, UA Yellow Yellow   Appearance Ur Clear Clear   Leukocytes,UA Negative Negative   Protein,UA Negative Negative/Trace    Glucose, UA 3+ (A) Negative   Ketones, UA Negative Negative   RBC, UA Negative Negative   Bilirubin, UA Negative Negative   Urobilinogen, Ur 0.2 0.2 - 1.0 mg/dL   Nitrite, UA Negative Negative   Microscopic Examination Comment     Comment: Microscopic follows if indicated.   Microscopic Examination See below:     Comment: Microscopic was indicated and was performed.   Urinalysis Reflex Comment     Comment: This specimen will not reflex to a Urine Culture.  Microscopic Examination     Status: None   Collection Time: 08/20/20 12:48 PM   Urine  Result Value Ref Range   WBC, UA None seen 0 - 5 /hpf   RBC None seen 0 - 2 /hpf   Epithelial Cells (non renal) None seen 0 - 10 /hpf   Casts None seen None seen /lpf   Bacteria, UA None seen None seen/Few   MMSE - Mini Mental State Exam 08/20/2020 11/14/2019  Orientation to time 5 3  Orientation to Place 5 5  Registration 3 3  Attention/ Calculation 5 5  Recall 3 3  Language- name 2 objects 2 2  Language- repeat 1 1  Language- follow 3 step command 3 3  Language- read & follow direction 1 1  Write a sentence 0 1  Copy design 1 1  Total score 29 28        Fall Risk  08/20/2020 07/31/2020 07/04/2020 11/14/2019 10/17/2019  Falls in the past year? 0 0 0 0 0  Number falls in past yr: - - - - -  Injury with Fall? - - - - -   Depression screen [facility] 2/9 08/20/2020 07/31/2020 07/04/2020 11/14/2019 10/17/2019  Decreased Interest 0 0 0 0 0  Down, Depressed, Hopeless 0 0 0 0 0  PHQ - 2 Score 0 0 0 0 0    Assessment/Plan: 1. Encounter for routine adult health examination with abnormal findings Well appearing 69 year old female Will set up for ColoGuard testing, up to date on other PHM Multiple chronic conditions, followed by multiple providers, routinely assess for polypharmacy  2. Essential hypertension, benign BP on lower end today, asymptomatic, HR stable Will review echo for underlying cardiac disease Advised to lower frequency of  Hydralazine to BID Will follow-up  3. Bilateral lower extremity edema Obtain updated echocardiogram, will review and adjust plan of care as needed - ECHOCARDIOGRAM COMPLETE; Future  4. Type 2 diabetes mellitus with complication (HCC) Followed by endocrinology, DM continues to be uncontrolled, discussed importance of strict A1C control--focus on diet and exercise  5. Chronic kidney disease, stage 3a ([place]) Continue to be followed by nephrology Management and further testing to be performed by nephrology  6. Colon cancer screening Set up for ColoGuard testing  7. Dysuria - UA/M w/rflx Culture, Routine  General Counseling: Treena verbalizes understanding of the findings of todays visit and agrees with plan of treatment. I have discussed any further diagnostic evaluation that may be needed or ordered today. We also reviewed her medications today. she has been encouraged to call the office with any questions or concerns that should arise related to todays visit.    Counseling: Diabetes Counseling:  1. Addition of ACE inh/ ARB'S for nephroprotection. Microalbumin is  updated  2. Diabetic foot care, prevention of complications. Podiatry consult 3. Exercise and lose weight.  4. Diabetic eye examination, Diabetic eye exam is updated  5. Monitor blood sugar closlely. nutrition counseling.  6. Sign and symptoms of hypoglycemia including shaking sweating,confusion and headaches.    Orders Placed This Encounter  Procedures  . Microscopic Examination  . UA/M w/rflx Culture, Routine  . ECHOCARDIOGRAM COMPLETE      Total time spent 30 Minutes  Time spent includes review of chart, medications, test results, and follow up plan with the patient.   This patient was seen by Casey Burkitt AGNP-C Collaboration with Dr Lavera Guise as a part of collaborative care agreement   Tanna Furry. [facility] Medicine

## 2020-08-21 LAB — MICROSCOPIC EXAMINATION
Bacteria, UA: NONE SEEN
Casts: NONE SEEN /lpf
Epithelial Cells (non renal): NONE SEEN /hpf (ref 0–10)
RBC, Urine: NONE SEEN /hpf (ref 0–2)
WBC, UA: NONE SEEN /hpf (ref 0–5)

## 2020-08-21 LAB — UA/M W/RFLX CULTURE, ROUTINE
Bilirubin, UA: NEGATIVE
Ketones, UA: NEGATIVE
Leukocytes,UA: NEGATIVE
Nitrite, UA: NEGATIVE
Protein,UA: NEGATIVE
RBC, UA: NEGATIVE
Specific Gravity, UA: 1.01 (ref 1.005–1.030)
Urobilinogen, Ur: 0.2 mg/dL (ref 0.2–1.0)
pH, UA: 5 (ref 5.0–7.5)

## 2020-08-22 NOTE — Procedures (Signed)
[place], [place] [id number]  DATE OF SERVICE: August 16, 2020  CAROTID DOPPLER INTERPRETATION:  Bilateral Carotid Ultrsasound and Color Doppler Examination was performed. The RIGHT CCA shows moderate plaque in the vessel. The LEFT CCA shows mild plaque in the vessel. There was no intimal thickening noted in the RIGHT carotid artery. There was 1.8 mm intimal thickening in the LEFT carotid artery.  The RIGHT CCA shows peak systolic velocity of 69 cm per second. The end diastolic velocity is 11 cm per second on the RIGHT side. The RIGHT ICA shows peak systolic velocity of 32 per second. RIGHT sided ICA end diastolic velocity is 12 cm per second. The RIGHT ECA shows a peak systolic velocity of 63 cm per second. The ICA/CCA ratio is calculated to be 0.54. This suggests less than 50% stenosis. The Vertebral Artery shows antegrade flow.  The LEFT CCA shows peak systolic velocity of 79 cm per second. The end diastolic velocity is 9 cm per second on the LEFT side. The LEFT ICA shows peak systolic velocity of 54 per second. LEFT sided ICA end diastolic velocity is 12 cm per second. The LEFT ECA shows a peak systolic velocity of 68 cm per second. The ICA/CCA ratio is calculated to be 0.79. This suggests less than 50% stenosis. The Vertebral Artery shows antegrade flow.   Impression:    The RIGHT CAROTID shows less than 50% stenosis. The LEFT CAROTID shows less than 50% stenosis.  There is mild plaque formation noted on the LEFT and moderate on the RIGHT  side. Consider a repeat Carotid doppler if clinical situation and symptoms warrant in 6-12 months. Patient should be encouraged to change lifestyles such as smoking cessation, regular exercise and dietary modification. Use of statins in the right clinical setting and ASA is encouraged.  Allyne Gee, MD [facility] Pulmonary Critical Care Medicine

## 2020-08-23 ENCOUNTER — Telehealth: Payer: Self-pay

## 2020-08-23 ENCOUNTER — Encounter: Payer: Self-pay | Admitting: Hospice and Palliative Medicine

## 2020-08-23 NOTE — Telephone Encounter (Signed)
Faxed cologuard 

## 2020-08-28 ENCOUNTER — Ambulatory Visit: Payer: Medicare Other | Admitting: Podiatry

## 2020-08-28 ENCOUNTER — Ambulatory Visit: Payer: Medicaid Other

## 2020-08-28 ENCOUNTER — Other Ambulatory Visit: Payer: Self-pay

## 2020-08-28 DIAGNOSIS — R0602 Shortness of breath: Secondary | ICD-10-CM

## 2020-08-28 DIAGNOSIS — R600 Localized edema: Secondary | ICD-10-CM

## 2020-08-29 ENCOUNTER — Other Ambulatory Visit: Payer: Self-pay

## 2020-08-29 ENCOUNTER — Other Ambulatory Visit: Payer: Self-pay | Admitting: Internal Medicine

## 2020-08-29 DIAGNOSIS — I739 Peripheral vascular disease, unspecified: Secondary | ICD-10-CM

## 2020-08-29 MED ORDER — CILOSTAZOL 50 MG PO TABS: 50.0000 mg | ORAL_TABLET | Freq: Two times a day (BID) | ORAL | 0 refills | Status: DC

## 2020-09-02 ENCOUNTER — Ambulatory Visit: Payer: Medicare Other | Admitting: Podiatry

## 2020-09-04 ENCOUNTER — Ambulatory Visit (INDEPENDENT_AMBULATORY_CARE_PROVIDER_SITE_OTHER): Payer: Medicare Other

## 2020-09-04 ENCOUNTER — Other Ambulatory Visit: Payer: Self-pay

## 2020-09-04 DIAGNOSIS — Z23 Encounter for immunization: Secondary | ICD-10-CM

## 2020-09-17 ENCOUNTER — Ambulatory Visit (INDEPENDENT_AMBULATORY_CARE_PROVIDER_SITE_OTHER): Payer: Medicare Other | Admitting: Hospice and Palliative Medicine

## 2020-09-17 ENCOUNTER — Other Ambulatory Visit: Payer: Self-pay

## 2020-09-17 ENCOUNTER — Encounter: Payer: Self-pay | Admitting: Hospice and Palliative Medicine

## 2020-09-17 DIAGNOSIS — N1831 Chronic kidney disease, stage 3a: Secondary | ICD-10-CM

## 2020-09-17 DIAGNOSIS — D869 Sarcoidosis, unspecified: Secondary | ICD-10-CM | POA: Diagnosis not present

## 2020-09-17 DIAGNOSIS — E1165 Type 2 diabetes mellitus with hyperglycemia: Secondary | ICD-10-CM

## 2020-09-17 DIAGNOSIS — Z794 Long term (current) use of insulin: Secondary | ICD-10-CM

## 2020-09-17 DIAGNOSIS — I5189 Other ill-defined heart diseases: Secondary | ICD-10-CM

## 2020-09-17 DIAGNOSIS — I10 Essential (primary) hypertension: Secondary | ICD-10-CM | POA: Diagnosis not present

## 2020-09-17 NOTE — Progress Notes (Signed)
[facility] [place], [place] [address]  Internal MEDICINE  Office Visit Note  Patient Name: Chelsey [name]  [id number]  [id number]  Date of Service: 09/19/2020  Chief Complaint  Patient presents with  . Follow-up    U/S   . Diabetes  . Hypertension    HPI Patient is here for routine follow-up Reviewed her echocardiogram--normal LVEF, diastolic dysfunction, trace TR and MR Continues to be followed by multiple providers locally as well as in [place] within [facility] system  Attempted to review her medications--she is unsure of all of the names of medications she is currently taking  At last visit, BP was low and advised to lower her hydralazine to BID, she reports she has been taking it TID  Followed by podiatry every 3 months for diabetic foot care Followed by endocrinology at [facility] last appt her endocrinologist requested that she speak with PCP about restarting Metformin Followed by nephrology--Dr. Candiss Norse, CKD Stage 3b--also following and monitoring her complex renal cyst, hyperparathyroidism, followed every 4 months Followed by rheumatology for arthritis, gout and sarcoidosis Followed by pulmonary for sarcoidosis   No acute complaints today, reports overall she feels well  Current Medication: Outpatient Encounter Medications as of 09/17/2020  Medication Sig Note  . albuterol (PROAIR HFA) 108 (90 Base) MCG/ACT inhaler Inhale 2 puffs into the lungs every 6 (six) hours as needed for wheezing or shortness of breath.   . allopurinol (ZYLOPRIM) 300 MG tablet Take 1 tablet (300 mg total) by mouth daily.   Marland Kitchen amLODipine (NORVASC) 10 MG tablet TAKE 1 TABLET BY MOUTH DAILY   . aspirin EC 81 MG tablet Take 81 mg by mouth daily.   Marland Kitchen atorvastatin (LIPITOR) 10 MG tablet TAKE 1 TABLET BY MOUTH DAILY FOR CHOLESTEROL   . Blood Glucose Monitoring Suppl (ACCU-CHEK AVIVA PLUS) W/DEVICE KIT Use as directed. 12/20/2013: Received from: External Pharmacy  . cilostazol (PLETAL)  50 MG tablet Take 1 tablet (50 mg total) by mouth 2 (two) times daily.   . colchicine 0.6 MG tablet TAKE 1 TO 2 TABLETS BY MOUTH DAILY AS NEEDED FOR GOUT FLARE **ONLY TAKE WITH GOUT FLARE**   . conjugated estrogens (PREMARIN) vaginal cream Place 1 Applicatorful vaginally 2 (two) times a week.   . dapagliflozin propanediol (FARXIGA) 10 MG TABS tablet Take 1 tablet (10 mg total) by mouth daily. Take 1 tablet daily   . Dulaglutide 4.5 MG/0.5ML SOPN Inject 4.5 mg into the skin once a week.   . fluticasone (FLONASE) 50 MCG/ACT nasal spray Frequency:QD   Dosage:50   MCG  Instructions:  Note:2 sprays each nostril daily Dose: 50MCG 08/21/2016: Received from: [facility]  . Fluticasone-Salmeterol (ADVAIR) 250-50 MCG/DOSE AEPB Inhale 1 puff into the lungs 2 (two) times daily.   Marland Kitchen gabapentin (NEURONTIN) 300 MG capsule Take 300 mg by mouth 3 (three) times daily.   Marland Kitchen glucose blood test strip Test sugar 4 x aday for uncontrolled dm on insulin E11.22   . hydrALAZINE (APRESOLINE) 10 MG tablet TAKE 1 TABLET BY MOUTH 3 TIMES A DAY   . hydroxychloroquine (PLAQUENIL) 200 MG tablet TAKE 1 TABLET BY MOUTH TWICE A DAY   . insulin degludec (TRESIBA FLEXTOUCH) 200 UNIT/ML FlexTouch Pen 30 Units with breakfast, with lunch, and with evening meal.   . insulin lispro (HUMALOG) 100 UNIT/ML KwikPen 30 units at breakfast 35 units at lunch 30 units at dinner   . insulin regular human CONCENTRATED (HUMULIN R U-500 KWIKPEN) 500 UNIT/ML kwikpen  Take insulin 60 units three times a day   . LANCETS ULTRA THIN MISC Apply 1 each topically daily.  09/20/2013: Received from: External Pharmacy  . levothyroxine (SYNTHROID) 50 MCG tablet TAKE 1 TABLET BY MOUTH IN THE MORNING ONAN EMPTY STOMACH FOR UNDERACTIVE THYROID   . linaclotide (LINZESS) 145 MCG CAPS capsule Take 1 capsule (145 mcg total) by mouth daily.   Marland Kitchen losartan (COZAAR) 50 MG tablet Take 1 tablet (50 mg total) by mouth daily. For htn   . omeprazole (PRILOSEC) 20 MG capsule Take  one tab po bid for GERD   . OXYGEN Inhale 3 L into the lungs. Nighttime use   . polyethylene glycol (MIRALAX / GLYCOLAX) packet Take 17 g by mouth daily.   . potassium chloride SA (KLOR-CON) 20 MEQ tablet TAKE 1 TABLET BY MOUTH DAILY   . ULTICARE SHORT PEN NEEDLES 31G X 8 MM MISC USE 3 TIMES A DAY   . [DISCONTINUED] Dulaglutide (TRULICITY) 3 AY/3.0ZS SOPN Inject 3 mg into the skin once a week. (Patient taking differently: Inject 4 mg into the skin once a week. )   . [DISCONTINUED] betamethasone acetate-betamethasone sodium phosphate (CELESTONE) injection 3 mg     No facility-administered encounter medications on file as of 09/17/2020.    Surgical History: Past Surgical History:  Procedure Laterality Date  . BRAIN SURGERY  1990's  . CATARACT EXTRACTION W/PHACO Left 06/11/2016   Procedure: CATARACT EXTRACTION PHACO AND INTRAOCULAR LENS PLACEMENT (IOC);  Surgeon: Birder Robson, MD;  Location: [facility] ORS;  Service: Ophthalmology;  Laterality: Left;  [place] 1.01 AP% 21.6 CDE 13.23 Fluid pack lot # [id number]  . CEREBRAL ANEURYSM REPAIR  1990's   COILS  . CORONARY ARTERY BYPASS GRAFT    . EYE SURGERY  2000's   bilateral cataract  . FRACTURE SURGERY    . HERNIA REPAIR  2000   ventral  . STENT PLACEMENT VASCULAR ([place] HX)    . VEIN BYPASS SURGERY    . VENTRAL HERNIA REPAIR N/A 04/29/2016   Procedure: Laparoscopic HERNIA REPAIR VENTRAL ADULT;  Surgeon: Jules Husbands, MD;  Location: [facility] ORS;  Service: General;  Laterality: N/A;    Medical History: Past Medical History:  Diagnosis Date  . Anemia   . Aneurysm ([place])   . Arthritis    RHEUMATOID ARTHRITIS  . Asthma   . Collagen vascular disease ([place])   . COPD (chronic obstructive pulmonary disease) ([place])   . Coronary artery disease   . Diabetes mellitus without complication ([place])   . Edema    FEET/LEGS  . GERD (gastroesophageal reflux disease)   . Gout   . H/O wheezing   . History of hiatal hernia   . Hypertension   . Hypothyroidism    . Neuropathy   . Oxygen deficiency    HS  . Peripheral vascular disease ([place])   . Sarcoidosis of lung ([place])   . Seizures ([place])    WITH BRAIN 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   . Sleep apnea    OXYGEN AT NIGHT 3 L     Family History: Family History  Problem Relation Age of Onset  . Heart disease Mother   . Hypertension Mother   . Diabetes Mother   . Diabetes Father   . Heart disease Father   . Hypertension Father   . Breast cancer Maternal Aunt     Social History   Socioeconomic History  . Marital status: Single    Spouse name: Not on file  . Number of  children: Not on file  . Years of education: Not on file  . Highest education level: Not on file  Occupational History  . Not on file  Tobacco Use  . Smoking status: Former Smoker    Packs/day: 1.00    Years: 30.00    Pack years: 30.00    Types: Cigarettes    Quit date: 01/27/2006    Years since quitting: 14.6  . Smokeless tobacco: Never Used  . Tobacco comment: quit   Substance and Sexual Activity  . Alcohol use: No    Alcohol/week: 0.0 standard drinks  . Drug use: No  . Sexual activity: Not on file  Other Topics Concern  . Not on file  Social History Narrative  . Not on file   Social Determinants of Health   Financial Resource Strain:   . Difficulty of Paying Living Expenses: Not on file  Food Insecurity:   . Worried About Charity fundraiser in the Last Year: Not on file  . Ran Out of Food in the Last Year: Not on file  Transportation Needs:   . Lack of Transportation (Medical): Not on file  . Lack of Transportation (Non-Medical): Not on file  Physical Activity:   . Days of Exercise per Week: Not on file  . Minutes of Exercise per Session: Not on file  Stress:   . Feeling of Stress : Not on file  Social Connections:   . Frequency of Communication with Friends and Family: Not on file  . Frequency of Social Gatherings with Friends and Family: Not on file  . Attends Religious Services: Not on file  .  Active Member of Clubs or Organizations: Not on file  . Attends Archivist Meetings: Not on file  . Marital Status: Not on file  Intimate Partner Violence:   . Fear of Current or Ex-Partner: Not on file  . Emotionally Abused: Not on file  . Physically Abused: Not on file  . Sexually Abused: Not on file   Review of Systems  Constitutional: Negative for chills, diaphoresis and fatigue.  HENT: Negative for ear pain, postnasal drip and sinus pressure.   Eyes: Negative for photophobia, discharge, redness, itching and visual disturbance.  Respiratory: Negative for cough, shortness of breath and wheezing.   Cardiovascular: Negative for chest pain, palpitations and leg swelling.  Gastrointestinal: Negative for abdominal pain, constipation, diarrhea, nausea and vomiting.  Genitourinary: Negative for dysuria and flank pain.  Musculoskeletal: Negative for arthralgias, back pain, gait problem and neck pain.  Skin: Negative for color change.  Allergic/Immunologic: Negative for environmental allergies and food allergies.  Neurological: Negative for dizziness and headaches.  Hematological: Does not bruise/bleed easily.  Psychiatric/Behavioral: Negative for agitation, behavioral problems (depression) and hallucinations.    Vital Signs: BP 140/62   Pulse 93   Temp 97.8 F (36.6 C)   Resp 16   Ht 5' 5"  (1.651 m)   Wt 212 lb (96.2 kg)   SpO2 95%   BMI 35.28 kg/m    Physical Exam Constitutional:      Appearance: Normal appearance. She is obese.  Cardiovascular:     Rate and Rhythm: Normal rate and regular rhythm.     Pulses: Normal pulses.     Heart sounds: Normal heart sounds.  Pulmonary:     Effort: Pulmonary effort is normal.     Breath sounds: Normal breath sounds.  Abdominal:     General: Abdomen is flat.  Musculoskeletal:  General: Normal range of motion.     Cervical back: Normal range of motion.  Skin:    General: Skin is warm.  Neurological:     General:  No focal deficit present.     Mental Status: She is alert and oriented to person, place, and time. Mental status is at baseline.  Psychiatric:        Mood and Affect: Mood normal.        Behavior: Behavior normal.        Thought Content: Thought content normal.    Assessment/Plan: 1. Type 2 diabetes mellitus with hyperglycemia, with long-term current use of insulin (HCC) At this time, I do not recommend that she be started back on Metformin due to her CKD, most recent GFR 34 Continue to follow-up with endocrinology at this time, appears that glucose levels remain uncontrolled--unsure of medication compliance Again, discussed the importance of medication compliance and to avoid receiving prescriptions from multiple medical providers  2. Essential hypertension, benign BP and HR stable today on current therapy, continue with routine monitoring  3. Chronic kidney disease, stage 3a (HCC) Avoid nephrotoxic agents, continue to follow with nephrology and appreciate their recommendations  4. Sarcoidosis Followed by pulmonology as well as dermatology, no recent flares of hospitalizations, continue with routine montioring  5. Diastolic dysfunction Has had PSG in the past--will need further follow-up and clarification, not currently using CPAP, does use oxygen at night on occasion?  General Counseling: Mekaila verbalizes understanding of the findings of todays visit and agrees with plan of treatment. I have discussed any further diagnostic evaluation that may be needed or ordered today. We also reviewed her medications today. she has been encouraged to call the office with any questions or concerns that should arise related to todays visit.  Time spent: 30 Minutes Time spent includes review of chart, medications, test results and follow-up plan with the patient.  This patient was seen by Theodoro Grist AGNP-C in Collaboration with Dr Lavera Guise as a part of collaborative care agreement      Tanna Furry. Keigan Girten AGNP-C Internal medicine

## 2020-09-19 ENCOUNTER — Encounter: Payer: Self-pay | Admitting: Hospice and Palliative Medicine

## 2020-10-03 ENCOUNTER — Ambulatory Visit: Payer: Medicare Other | Admitting: Podiatry

## 2020-10-03 ENCOUNTER — Other Ambulatory Visit: Payer: Self-pay

## 2020-10-03 ENCOUNTER — Other Ambulatory Visit: Payer: Self-pay | Admitting: Internal Medicine

## 2020-10-03 MED ORDER — AMLODIPINE BESYLATE 10 MG PO TABS
10.0000 mg | ORAL_TABLET | Freq: Every day | ORAL | 1 refills | Status: DC
Start: 2020-10-03 — End: 2021-09-13

## 2020-10-07 ENCOUNTER — Encounter: Payer: Self-pay | Admitting: Podiatry

## 2020-10-07 ENCOUNTER — Ambulatory Visit (INDEPENDENT_AMBULATORY_CARE_PROVIDER_SITE_OTHER): Payer: Medicare Other | Admitting: Hospice and Palliative Medicine

## 2020-10-07 ENCOUNTER — Other Ambulatory Visit: Payer: Self-pay

## 2020-10-07 ENCOUNTER — Ambulatory Visit (INDEPENDENT_AMBULATORY_CARE_PROVIDER_SITE_OTHER): Payer: Medicare Other | Admitting: Podiatry

## 2020-10-07 ENCOUNTER — Encounter: Payer: Self-pay | Admitting: Hospice and Palliative Medicine

## 2020-10-07 DIAGNOSIS — L84 Corns and callosities: Secondary | ICD-10-CM

## 2020-10-07 DIAGNOSIS — I10 Essential (primary) hypertension: Secondary | ICD-10-CM

## 2020-10-07 DIAGNOSIS — I739 Peripheral vascular disease, unspecified: Secondary | ICD-10-CM

## 2020-10-07 DIAGNOSIS — R591 Generalized enlarged lymph nodes: Secondary | ICD-10-CM

## 2020-10-07 DIAGNOSIS — E1159 Type 2 diabetes mellitus with other circulatory complications: Secondary | ICD-10-CM | POA: Diagnosis not present

## 2020-10-07 DIAGNOSIS — M79676 Pain in unspecified toe(s): Secondary | ICD-10-CM

## 2020-10-07 DIAGNOSIS — B351 Tinea unguium: Secondary | ICD-10-CM

## 2020-10-07 NOTE — Progress Notes (Signed)
This patient returns to my office for at risk foot care.  This patient requires this care by a professional since this patient will be at risk due to having  Type 2 diabetes and peripheral vascular disease.  .  This patient is unable to cut nails herself since the patient cannot reach her nails.These nails are painful walking and wearing shoes.  This patient presents for at risk foot care today.  General Appearance  Alert, conversant and in no acute stress.  Vascular  Dorsalis pedis and posterior tibial  pulses are barely  palpable  bilaterally.  Capillary return is within normal limits  bilaterally. Temperature is within normal limits  bilaterally.  Neurologic  Senn-Weinstein monofilament wire test diminished  bilaterally. Muscle power within normal limits bilaterally.  Nails Thick disfigured discolored nails with subungual debris  from hallux to fifth toes bilaterally. No evidence of bacterial infection or drainage bilaterally.  Orthopedic  No limitations of motion  feet .  No crepitus or effusions noted.  No bony pathology or digital deformities noted.  Pes planus and HAV  B/L.  DJD STJ left foot.  Skin  normotropic skin with no porokeratosis noted bilaterally.  No signs of infections or ulcers noted.   Pinch callus  B/l.  Thin shiny skin  Onychomycosis  Pain in right toes  Pain in left toes  Pinch callus left hallux  Consent was obtained for treatment procedures.   Mechanical debridement of nails 1-5  bilaterally performed with a nail nipper.  Filed with dremel without incident. Debrided callus with # 15 blade.  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   Return office visit    3 months                  Told patient to return for periodic foot care and evaluation due to potential at risk complications.   Gardiner Barefoot DPM

## 2020-10-07 NOTE — Progress Notes (Signed)
[facility] [place], [place] [address]  Internal MEDICINE  Office Visit Note  Patient Name: Chelsey [name]  [id number]  [id number]  Date of Service: 10/12/2020  Chief Complaint  Patient presents with  . Acute Visit    painful bump in throat on right front side  . Hypertension  . Sleep Apnea  . Gastroesophageal Reflux  . Diabetes  . Anemia  . Asthma  . COPD  . policy update form    received  . Quality Metric Gaps    colonoscopy     HPI Pt is here for a sick visit. Complains today of noticing a bump/knot of the side of her neck, near her throat She is unsure as to how long it has been there, was rubbing her neck and noticed the bump Bump/knot is not associated with pain or tenderness, no difficulty swallowing, no sore throat  Denies any recent acute symptoms of GERD that may have irritated her throat  Colonoscopy completed by [facility] 2018  Current Medication:  Outpatient Encounter Medications as of 10/07/2020  Medication Sig Note  . albuterol (PROAIR HFA) 108 (90 Base) MCG/ACT inhaler Inhale 2 puffs into the lungs every 6 (six) hours as needed for wheezing or shortness of breath.   . allopurinol (ZYLOPRIM) 300 MG tablet Take 1 tablet (300 mg total) by mouth daily.   Marland Kitchen amLODipine (NORVASC) 10 MG tablet Take 1 tablet (10 mg total) by mouth daily.   Marland Kitchen aspirin EC 81 MG tablet Take 81 mg by mouth daily.   Marland Kitchen atorvastatin (LIPITOR) 10 MG tablet TAKE 1 TABLET BY MOUTH DAILY FOR CHOLESTEROL   . Blood Glucose Monitoring Suppl (ACCU-CHEK AVIVA PLUS) W/DEVICE KIT Use as directed. 12/20/2013: Received from: External Pharmacy  . cilostazol (PLETAL) 50 MG tablet Take 1 tablet (50 mg total) by mouth 2 (two) times daily.   . colchicine 0.6 MG tablet TAKE 1 TO 2 TABLETS BY MOUTH DAILY AS NEEDED FOR GOUT FLARE **ONLY TAKE WITH GOUT FLARE**   . conjugated estrogens (PREMARIN) vaginal cream Place 1 Applicatorful vaginally 2 (two) times a week.   . dapagliflozin  propanediol (FARXIGA) 10 MG TABS tablet Take 1 tablet (10 mg total) by mouth daily. Take 1 tablet daily   . Dulaglutide 4.5 MG/0.5ML SOPN Inject 4.5 mg into the skin once a week.   . ferrous sulfate 325 (65 FE) MG tablet TAKE 1 TABLET BY MOUTH EVERY DAY WITH BREAKFAST   . fluticasone (FLONASE) 50 MCG/ACT nasal spray Frequency:QD   Dosage:50   MCG  Instructions:  Note:2 sprays each nostril daily Dose: 50MCG 08/21/2016: Received from: [facility]  . Fluticasone-Salmeterol (ADVAIR) 250-50 MCG/DOSE AEPB Inhale 1 puff into the lungs 2 (two) times daily.   Marland Kitchen gabapentin (NEURONTIN) 300 MG capsule TAKE 1 CAPSULE BY MOUTH 3 TIMES A DAY   . glucose blood test strip Test sugar 4 x aday for uncontrolled dm on insulin E11.22   . hydrALAZINE (APRESOLINE) 10 MG tablet TAKE 1 TABLET BY MOUTH 3 TIMES A DAY   . hydroxychloroquine (PLAQUENIL) 200 MG tablet TAKE 1 TABLET BY MOUTH TWICE A DAY   . insulin degludec (TRESIBA FLEXTOUCH) 200 UNIT/ML FlexTouch Pen 30 Units with breakfast, with lunch, and with evening meal.   . insulin lispro (HUMALOG) 100 UNIT/ML KwikPen 30 units at breakfast 35 units at lunch 30 units at dinner   . insulin regular human CONCENTRATED (HUMULIN R U-500 KWIKPEN) 500 UNIT/ML kwikpen Take insulin 60 units three  times a day   . LANCETS ULTRA THIN MISC Apply 1 each topically daily.  09/20/2013: Received from: External Pharmacy  . levothyroxine (SYNTHROID) 50 MCG tablet TAKE 1 TABLET BY MOUTH IN THE MORNING ONAN EMPTY STOMACH FOR UNDERACTIVE THYROID   . linaclotide (LINZESS) 145 MCG CAPS capsule Take 1 capsule (145 mcg total) by mouth daily.   . losartan (COZAAR) 50 MG tablet Take 1 tablet (50 mg total) by mouth daily. For htn   . omeprazole (PRILOSEC) 20 MG capsule Take one tab po bid for GERD   . OXYGEN Inhale 3 L into the lungs. Nighttime use   . potassium chloride SA (KLOR-CON) 20 MEQ tablet TAKE 1 TABLET BY MOUTH DAILY   . torsemide (DEMADEX) 10 MG tablet Take 10 mg by mouth daily.   .  ULTICARE SHORT PEN NEEDLES 31G X 8 MM MISC USE 3 TIMES A DAY   . ondansetron (ZOFRAN-ODT) 4 MG disintegrating tablet Take 1 tablet (4 mg total) by mouth every 8 (eight) hours as needed for nausea or vomiting.   . polyethylene glycol-electrolytes (NULYTELY) 420 g solution May substitute with Gavilyte G, Nulytely, Trilytely.  Take as directed.    No facility-administered encounter medications on file as of 10/07/2020.      Medical History: Past Medical History:  Diagnosis Date  . Anemia   . Aneurysm (HCC)   . Arthritis    RHEUMATOID ARTHRITIS  . Asthma   . Collagen vascular disease (HCC)   . COPD (chronic obstructive pulmonary disease) (HCC)   . Coronary artery disease   . Diabetes mellitus without complication (HCC)   . Edema    FEET/LEGS  . GERD (gastroesophageal reflux disease)   . Gout   . H/O wheezing   . History of hiatal hernia   . Hypertension   . Hypothyroidism   . Neuropathy   . Oxygen deficiency    HS  . Peripheral vascular disease (HCC)   . Sarcoidosis of lung (HCC)   . Seizures (HCC)    WITH BRAIN 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   . Sleep apnea    OXYGEN AT NIGHT 3 L [place]     Vital Signs: BP 118/68   Pulse 75   Temp (!) 97.4 F (36.3 C)   Resp 16   Ht 5' 4" (1.626 m)   Wt 215 lb 9.6 oz (97.8 kg)   SpO2 98%   BMI 37.01 kg/m    Review of Systems  Constitutional: Negative for chills, diaphoresis and fatigue.  HENT: Negative for ear pain, postnasal drip and sinus pressure.        Bump/knot on neck  Eyes: Negative for photophobia, discharge, redness, itching and visual disturbance.  Respiratory: Negative for cough, shortness of breath and wheezing.   Cardiovascular: Negative for chest pain, palpitations and leg swelling.  Gastrointestinal: Negative for abdominal pain, constipation, diarrhea, nausea and vomiting.  Genitourinary: Negative for dysuria and flank pain.  Musculoskeletal: Negative for arthralgias, back pain, gait problem and neck pain.  Skin:  Negative for color change.  Allergic/Immunologic: Negative for environmental allergies and food allergies.  Neurological: Negative for dizziness and headaches.  Hematological: Does not bruise/bleed easily.  Psychiatric/Behavioral: Negative for agitation, behavioral problems (depression) and hallucinations.    Physical Exam Vitals reviewed.  Constitutional:      Appearance: Normal appearance. She is obese.  Neck:      Comments: Right sided lymphadenopathy, non-tender, palpable Cardiovascular:     Rate and Rhythm: Normal rate and regular rhythm.       Pulses: Normal pulses.     Heart sounds: Normal heart sounds.  Pulmonary:     Effort: Pulmonary effort is normal.     Breath sounds: Normal breath sounds.  Musculoskeletal:        General: Normal range of motion.     Cervical back: Normal range of motion.  Skin:    General: Skin is warm.  Neurological:     General: No focal deficit present.     Mental Status: She is alert and oriented to person, place, and time. Mental status is at baseline.  Psychiatric:        Mood and Affect: Mood normal.        Behavior: Behavior normal.        Thought Content: Thought content normal.        Judgment: Judgment normal.    Assessment/Plan: 1. Lymphadenopathy of head and neck Will obtain US for evaluation, will proceed with work-up based on results - US Soft Tissue Head/Neck (NON-THYROID); Future  2. Essential hypertension BP and HR well controlled today, continue with routine monitoring  General Counseling: Tahja verbalizes understanding of the findings of todays visit and agrees with plan of treatment. I have discussed any further diagnostic evaluation that may be needed or ordered today. We also reviewed her medications today. she has been encouraged to call the office with any questions or concerns that should arise related to todays visit.   Orders Placed This Encounter  Procedures  . US Soft Tissue Head/Neck (NON-THYROID)    Time  spent: 30 Minutes Time spent includes review of chart, medications, test results and follow-up plan with the patient.  This patient was seen by Theodoro Grist AGNP-C in Collaboration with Dr Lavera Guise as a part of collaborative care agreement.  Tanna Furry [facility] Internal Medicine

## 2020-10-12 ENCOUNTER — Encounter: Payer: Self-pay | Admitting: Hospice and Palliative Medicine

## 2020-10-12 ENCOUNTER — Telehealth: Payer: Self-pay | Admitting: Hospice and Palliative Medicine

## 2020-10-12 MED ORDER — ONDANSETRON 4 MG PO TBDP: 4.0000 mg | ORAL_TABLET | Freq: Three times a day (TID) | ORAL | 0 refills | Status: DC | PRN

## 2020-10-12 NOTE — Telephone Encounter (Signed)
Called patient regarding message left on off hours phone. Complaining of N/V since Wednesday. Has had 4-5 episodes of vomiting since Wednesday, green in color. Denies abdominal pain, diarrhea or fevers. Has been able to keep down fluids such as water and small bland foods for snacks. Last BM was this morning and also had BM last night. Advised that she may need to seek emergency department evaluation if vomiting persists through the evening. Sent prescription for ODT Zofran. Will touch base with her again tomorrow.

## 2020-10-23 ENCOUNTER — Other Ambulatory Visit: Payer: Self-pay | Admitting: Hospice and Palliative Medicine

## 2020-10-23 ENCOUNTER — Telehealth: Payer: Self-pay

## 2020-10-23 ENCOUNTER — Other Ambulatory Visit: Payer: Self-pay | Admitting: Internal Medicine

## 2020-10-23 DIAGNOSIS — M1A09X0 Idiopathic chronic gout, multiple sites, without tophus (tophi): Secondary | ICD-10-CM

## 2020-10-23 NOTE — Telephone Encounter (Signed)
[employer] send faxed that pt refused for cologuard

## 2020-10-24 ENCOUNTER — Other Ambulatory Visit: Payer: Self-pay

## 2020-10-29 ENCOUNTER — Other Ambulatory Visit: Payer: Self-pay | Admitting: Internal Medicine

## 2020-10-29 DIAGNOSIS — I739 Peripheral vascular disease, unspecified: Secondary | ICD-10-CM

## 2020-10-30 ENCOUNTER — Other Ambulatory Visit: Payer: Self-pay | Admitting: Internal Medicine

## 2020-11-01 ENCOUNTER — Ambulatory Visit (INDEPENDENT_AMBULATORY_CARE_PROVIDER_SITE_OTHER): Payer: Medicare Other

## 2020-11-01 DIAGNOSIS — R591 Generalized enlarged lymph nodes: Secondary | ICD-10-CM

## 2020-11-13 ENCOUNTER — Other Ambulatory Visit: Payer: Self-pay

## 2020-11-13 ENCOUNTER — Ambulatory Visit (INDEPENDENT_AMBULATORY_CARE_PROVIDER_SITE_OTHER): Payer: Medicare Other | Admitting: Hospice and Palliative Medicine

## 2020-11-13 ENCOUNTER — Encounter: Payer: Self-pay | Admitting: Hospice and Palliative Medicine

## 2020-11-13 VITALS — BP 112/66 | HR 77 | Temp 97.5°F | Resp 16 | Ht 63.0 in | Wt 216.4 lb

## 2020-11-13 DIAGNOSIS — E1165 Type 2 diabetes mellitus with hyperglycemia: Secondary | ICD-10-CM

## 2020-11-13 DIAGNOSIS — G4734 Idiopathic sleep related nonobstructive alveolar hypoventilation: Secondary | ICD-10-CM

## 2020-11-13 DIAGNOSIS — Z794 Long term (current) use of insulin: Secondary | ICD-10-CM | POA: Diagnosis not present

## 2020-11-13 DIAGNOSIS — R591 Generalized enlarged lymph nodes: Secondary | ICD-10-CM

## 2020-11-13 DIAGNOSIS — I10 Essential (primary) hypertension: Secondary | ICD-10-CM | POA: Diagnosis not present

## 2020-11-13 LAB — POCT GLYCOSYLATED HEMOGLOBIN (HGB A1C): Hemoglobin A1C: 8.9 % — AB (ref 4.0–5.6)

## 2020-11-13 NOTE — Progress Notes (Signed)
[facility] [place], [place] [address]  Internal MEDICINE  Office Visit Note  Patient Name: Chelsey [name]  [id number]  [id number]  Date of Service: 11/17/2020  Chief Complaint  Patient presents with  . Follow-up    Review [place]  . Diabetes  . Gastroesophageal Reflux  . Sleep Apnea  . Hypertension  . Anemia  . Asthma  . COPD    HPI Patient is here for routine follow-up Discussed results of her soft tissue US of her neck-normal results Wearing Freestyle Libre given by endocrinology--diabetes as well as hypothyroidism managed by [facility] endocrinology, they have been working on regulating her medications Followed by rheumatology for RA  Wears 3LPM of supplemental oxygen at night--was prescribed nocturnal oxygen by [facility] after hospital admission several years ago, unsure if she has ever had a sleep study--not interested  Overall, things have been going well Continues to be followed by many different providers and specialists, poor historian regarding current medications   Current Medication: Outpatient Encounter Medications as of 11/13/2020  Medication Sig Note  . albuterol (PROAIR HFA) 108 (90 Base) MCG/ACT inhaler Inhale 2 puffs into the lungs every 6 (six) hours as needed for wheezing or shortness of breath.   . allopurinol (ZYLOPRIM) 300 MG tablet TAKE 1 TABLET BY MOUTH DAILY   . amLODipine (NORVASC) 10 MG tablet Take 1 tablet (10 mg total) by mouth daily.   Marland Kitchen aspirin EC 81 MG tablet Take 81 mg by mouth daily.   Marland Kitchen atorvastatin (LIPITOR) 10 MG tablet TAKE 1 TABLET BY MOUTH DAILY FOR CHOLESTEROL   . Blood Glucose Monitoring Suppl (ACCU-CHEK AVIVA PLUS) W/DEVICE KIT Use as directed. 12/20/2013: Received from: External Pharmacy  . cilostazol (PLETAL) 50 MG tablet TAKE 1 TABLET BY MOUTH TWICE A DAY   . colchicine 0.6 MG tablet TAKE 1 TO 2 TABLETS BY MOUTH DAILY AS NEEDED FOR GOUT FLARE **ONLY TAKE WITH GOUT FLARE**   . conjugated estrogens (PREMARIN) vaginal  cream Place 1 Applicatorful vaginally 2 (two) times a week.   . dapagliflozin propanediol (FARXIGA) 10 MG TABS tablet Take 1 tablet (10 mg total) by mouth daily. Take 1 tablet daily   . Dulaglutide 4.5 MG/0.5ML SOPN Inject 4.5 mg into the skin once a week.   . ferrous sulfate 325 (65 FE) MG tablet TAKE 1 TABLET BY MOUTH EVERY DAY WITH BREAKFAST   . fluticasone (FLONASE) 50 MCG/ACT nasal spray Frequency:QD   Dosage:50   MCG  Instructions:  Note:2 sprays each nostril daily Dose: 50MCG 08/21/2016: Received from: [facility]  . Fluticasone-Salmeterol (ADVAIR) 250-50 MCG/DOSE AEPB Inhale 1 puff into the lungs 2 (two) times daily.   Marland Kitchen gabapentin (NEURONTIN) 300 MG capsule TAKE 1 CAPSULE BY MOUTH 3 TIMES A DAY   . glucose blood test strip Test sugar 4 x aday for uncontrolled dm on insulin E11.22   . hydrALAZINE (APRESOLINE) 10 MG tablet TAKE 1 TABLET BY MOUTH 3 TIMES A DAY   . hydroxychloroquine (PLAQUENIL) 200 MG tablet TAKE 1 TABLET BY MOUTH TWICE A DAY   . insulin degludec (TRESIBA FLEXTOUCH) 200 UNIT/ML FlexTouch Pen 30 Units with breakfast, with lunch, and with evening meal.   . insulin lispro (HUMALOG) 100 UNIT/ML KwikPen 30 units at breakfast 35 units at lunch 30 units at dinner   . insulin regular human CONCENTRATED (HUMULIN R U-500 KWIKPEN) 500 UNIT/ML kwikpen Take insulin 60 units three times a day   . LANCETS ULTRA THIN MISC Apply 1 each topically  daily.  09/20/2013: Received from: External Pharmacy  . levothyroxine (SYNTHROID) 50 MCG tablet TAKE 1 TABLET BY MOUTH IN THE MORNING ONAN EMPTY STOMACH FOR UNDERACTIVE THYROID   . linaclotide (LINZESS) 145 MCG CAPS capsule Take 1 capsule (145 mcg total) by mouth daily.   Marland Kitchen losartan (COZAAR) 50 MG tablet Take 1 tablet (50 mg total) by mouth daily. For htn   . omeprazole (PRILOSEC) 20 MG capsule Take one tab po bid for GERD   . ondansetron (ZOFRAN-ODT) 4 MG disintegrating tablet Take 1 tablet (4 mg total) by mouth every 8 (eight) hours as needed for  nausea or vomiting.   . OXYGEN Inhale 3 L into the lungs. Nighttime use   . polyethylene glycol-electrolytes (NULYTELY) 420 g solution May substitute with Jaclynn Major, Nulytely, Trilytely.  Take as directed.   . potassium chloride SA (KLOR-CON) 20 MEQ tablet TAKE 1 TABLET BY MOUTH DAILY   . torsemide (DEMADEX) 10 MG tablet Take 10 mg by mouth daily.   Marland Kitchen ULTICARE SHORT PEN NEEDLES 31G X 8 MM MISC USE 3 TIMES A DAY    No facility-administered encounter medications on file as of 11/13/2020.    Surgical History: Past Surgical History:  Procedure Laterality Date  . BRAIN SURGERY  1990's  . CATARACT EXTRACTION W/PHACO Left 06/11/2016   Procedure: CATARACT EXTRACTION PHACO AND INTRAOCULAR LENS PLACEMENT (IOC);  Surgeon: Birder Robson, MD;  Location: [facility] ORS;  Service: Ophthalmology;  Laterality: Left;  [place] 1.01 AP% 21.6 CDE 13.23 Fluid pack lot # [id number]  . CEREBRAL ANEURYSM REPAIR  1990's   COILS  . CORONARY ARTERY BYPASS GRAFT    . EYE SURGERY  2000's   bilateral cataract  . FRACTURE SURGERY    . HERNIA REPAIR  2000   ventral  . STENT PLACEMENT VASCULAR ([place] HX)    . VEIN BYPASS SURGERY    . VENTRAL HERNIA REPAIR N/A 04/29/2016   Procedure: Laparoscopic HERNIA REPAIR VENTRAL ADULT;  Surgeon: Jules Husbands, MD;  Location: [facility] ORS;  Service: General;  Laterality: N/A;    Medical History: Past Medical History:  Diagnosis Date  . Anemia   . Aneurysm ([place])   . Arthritis    RHEUMATOID ARTHRITIS  . Asthma   . Collagen vascular disease ([place])   . COPD (chronic obstructive pulmonary disease) ([place])   . Coronary artery disease   . Diabetes mellitus without complication ([place])   . Edema    FEET/LEGS  . GERD (gastroesophageal reflux disease)   . Gout   . H/O wheezing   . History of hiatal hernia   . Hypertension   . Hypothyroidism   . Neuropathy   . Oxygen deficiency    HS  . Peripheral vascular disease ([place])   . Sarcoidosis of lung ([place])   . Seizures ([place])    WITH BRAIN  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   . Sleep apnea    OXYGEN AT NIGHT 3 L [place]    Family History: Family History  Problem Relation Age of Onset  . Heart disease Mother   . Hypertension Mother   . Diabetes Mother   . Diabetes Father   . Heart disease Father   . Hypertension Father   . Breast cancer Maternal Aunt     Social History   Socioeconomic History  . Marital status: Single    Spouse name: Not on file  . Number of children: Not on file  . Years of education: Not on file  . Highest education level:  Not on file  Occupational History  . Not on file  Tobacco Use  . Smoking status: Former Smoker    Packs/day: 1.00    Years: 30.00    Pack years: 30.00    Types: Cigarettes    Quit date: 01/27/2006    Years since quitting: 14.8  . Smokeless tobacco: Never Used  . Tobacco comment: quit   Substance and Sexual Activity  . Alcohol use: No    Alcohol/week: 0.0 standard drinks  . Drug use: No  . Sexual activity: Not on file  Other Topics Concern  . Not on file  Social History Narrative  . Not on file   Social Determinants of Health   Financial Resource Strain: Not on file  Food Insecurity: Not on file  Transportation Needs: Not on file  Physical Activity: Not on file  Stress: Not on file  Social Connections: Not on file  Intimate Partner Violence: Not on file      Review of Systems  Constitutional: Negative for chills, diaphoresis and fatigue.  HENT: Negative for ear pain, postnasal drip and sinus pressure.   Eyes: Negative for photophobia, discharge, redness, itching and visual disturbance.  Respiratory: Negative for cough, shortness of breath and wheezing.   Cardiovascular: Negative for chest pain, palpitations and leg swelling.  Gastrointestinal: Negative for abdominal pain, constipation, diarrhea, nausea and vomiting.  Genitourinary: Negative for dysuria and flank pain.  Musculoskeletal: Negative for arthralgias, back pain, gait problem and neck pain.  Skin:  Negative for color change.  Allergic/Immunologic: Negative for environmental allergies and food allergies.  Neurological: Negative for dizziness and headaches.  Hematological: Does not bruise/bleed easily.  Psychiatric/Behavioral: Negative for agitation, behavioral problems (depression) and hallucinations.    Vital Signs: BP 112/66   Pulse 77   Temp (!) 97.5 F (36.4 C)   Resp 16   Ht 5' 3"  (1.6 m)   Wt 216 lb 6.4 oz (98.2 kg)   SpO2 97%   BMI 38.33 kg/m    Physical Exam Vitals reviewed.  Constitutional:      Appearance: Normal appearance. She is obese.  Cardiovascular:     Rate and Rhythm: Normal rate and regular rhythm.     Pulses: Normal pulses.     Heart sounds: Normal heart sounds.  Pulmonary:     Effort: Pulmonary effort is normal.     Breath sounds: Normal breath sounds.  Abdominal:     General: Abdomen is flat.     Palpations: Abdomen is soft.  Musculoskeletal:        General: Normal range of motion.  Skin:    General: Skin is warm.  Neurological:     General: No focal deficit present.     Mental Status: She is alert and oriented to person, place, and time. Mental status is at baseline.  Psychiatric:        Mood and Affect: Mood normal.        Behavior: Behavior normal.        Thought Content: Thought content normal.        Judgment: Judgment normal.    Assessment/Plan: 1. Type 2 diabetes mellitus with hyperglycemia, with long-term current use of insulin ([place]) DM managed by endocrinology, per last visit note they are working with her on medication management and adherence, continue routine follow-ups, she wishes to proceed with allowing endocrinology to manage her diabetes - POCT HgB A1C  2. Essential hypertension BP and HR well controlled today on current therapy, continue to monitor  3. Lymphadenopathy of head and neck No evidence of adenopathy, mass or any abnormalities on [place] scan  4. Nocturnal hypoxia Continue with supplemental oxygen at night  as prescribed, recommended follow-up with provider that prescribed oxygen  General Counseling: Eiko verbalizes understanding of the findings of todays visit and agrees with plan of treatment. I have discussed any further diagnostic evaluation that may be needed or ordered today. We also reviewed her medications today. she has been encouraged to call the office with any questions or concerns that should arise related to todays visit.    Orders Placed This Encounter  Procedures  . POCT HgB A1C      Time spent: 30 Minutes Time spent includes review of chart, medications, test results and follow-up plan with the patient.  This patient was seen by Theodoro Grist AGNP-C in Collaboration with Dr Lavera Guise as a part of collaborative care agreement     Tanna Furry. Shawnda Mauney AGNP-C Internal medicine

## 2020-11-14 ENCOUNTER — Ambulatory Visit: Payer: Medicare Other | Admitting: Hospice and Palliative Medicine

## 2020-11-17 ENCOUNTER — Encounter: Payer: Self-pay | Admitting: Hospice and Palliative Medicine

## 2020-11-20 ENCOUNTER — Ambulatory Visit: Payer: Medicare Other | Admitting: Orthotics

## 2020-11-27 ENCOUNTER — Ambulatory Visit: Payer: Medicare Other

## 2020-11-28 ENCOUNTER — Other Ambulatory Visit: Payer: Medicare Other

## 2020-12-03 ENCOUNTER — Other Ambulatory Visit: Payer: Self-pay | Admitting: Internal Medicine

## 2020-12-03 ENCOUNTER — Other Ambulatory Visit: Payer: Self-pay

## 2020-12-03 MED ORDER — COLCHICINE 0.6 MG PO TABS
ORAL_TABLET | ORAL | 3 refills | Status: DC
Start: 2020-12-03 — End: 2021-04-16

## 2020-12-04 ENCOUNTER — Other Ambulatory Visit: Payer: Medicare Other | Admitting: Orthotics

## 2020-12-09 ENCOUNTER — Other Ambulatory Visit: Payer: Self-pay | Admitting: Internal Medicine

## 2020-12-09 DIAGNOSIS — I10 Essential (primary) hypertension: Secondary | ICD-10-CM

## 2020-12-18 ENCOUNTER — Ambulatory Visit: Payer: Medicare Other | Admitting: Hospice and Palliative Medicine

## 2020-12-27 ENCOUNTER — Other Ambulatory Visit: Payer: Self-pay | Admitting: Hospice and Palliative Medicine

## 2020-12-27 ENCOUNTER — Other Ambulatory Visit: Payer: Self-pay | Admitting: Internal Medicine

## 2020-12-27 DIAGNOSIS — I739 Peripheral vascular disease, unspecified: Secondary | ICD-10-CM

## 2020-12-30 ENCOUNTER — Other Ambulatory Visit: Payer: Self-pay

## 2020-12-30 ENCOUNTER — Ambulatory Visit (INDEPENDENT_AMBULATORY_CARE_PROVIDER_SITE_OTHER): Payer: Medicare Other | Admitting: Podiatry

## 2020-12-30 ENCOUNTER — Encounter: Payer: Self-pay | Admitting: Podiatry

## 2020-12-30 DIAGNOSIS — B351 Tinea unguium: Secondary | ICD-10-CM

## 2020-12-30 DIAGNOSIS — E1159 Type 2 diabetes mellitus with other circulatory complications: Secondary | ICD-10-CM | POA: Diagnosis not present

## 2020-12-30 DIAGNOSIS — I739 Peripheral vascular disease, unspecified: Secondary | ICD-10-CM | POA: Diagnosis not present

## 2020-12-30 DIAGNOSIS — M79676 Pain in unspecified toe(s): Secondary | ICD-10-CM | POA: Diagnosis not present

## 2020-12-30 MED ORDER — FLUCONAZOLE 150 MG PO TABS: ORAL_TABLET | ORAL | 0 refills | Status: DC

## 2020-12-30 NOTE — Telephone Encounter (Signed)
Pt called that she burning and itching  After urinated as per dr Humphrey Rolls send diflucan and also make follow up appt to check urine

## 2020-12-30 NOTE — Progress Notes (Signed)
This patient returns to my office for at risk foot care.  This patient requires this care by a professional since this patient will be at risk due to having  Type 2 diabetes and peripheral vascular disease.  .  This patient is unable to cut nails herself since the patient cannot reach her nails.These nails are painful walking and wearing shoes.  This patient presents for at risk foot care today.  General Appearance  Alert, conversant and in no acute stress.  Vascular  Dorsalis pedis and posterior tibial  pulses are barely  palpable  bilaterally.  Capillary return is within normal limits  bilaterally. Temperature is within normal limits  bilaterally.  Neurologic  Senn-Weinstein monofilament wire test diminished  bilaterally. Muscle power within normal limits bilaterally.  Nails Thick disfigured discolored nails with subungual debris  from hallux to fifth toes bilaterally. No evidence of bacterial infection or drainage bilaterally.  Orthopedic  No limitations of motion  feet .  No crepitus or effusions noted.  No bony pathology or digital deformities noted.  Pes planus and HAV  B/L.  DJD STJ left foot.  Skin  normotropic skin with no porokeratosis noted bilaterally.  No signs of infections or ulcers noted.   Pinch callus  Left foot  Thin shiny skin  Onychomycosis  Pain in right toes  Pain in left toes    Consent was obtained for treatment procedures.   Mechanical debridement of nails 1-5  bilaterally performed with a nail nipper.  Filed with dremel without incident. Debrided callus with # 15 blade.  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   Return office visit    3 months                  Told patient to return for periodic foot care and evaluation due to potential at risk complications.   Gardiner Barefoot DPM

## 2021-01-02 ENCOUNTER — Ambulatory Visit: Payer: Medicare Other | Admitting: Physician Assistant

## 2021-01-09 ENCOUNTER — Ambulatory Visit: Payer: Medicare Other | Admitting: Podiatry

## 2021-01-16 ENCOUNTER — Other Ambulatory Visit: Payer: Self-pay | Admitting: Hospice and Palliative Medicine

## 2021-01-16 MED ORDER — AZITHROMYCIN 250 MG PO TABS: ORAL_TABLET | ORAL | 0 refills | Status: DC

## 2021-01-16 MED ORDER — CETIRIZINE HCL 10 MG PO TABS: 10.0000 mg | ORAL_TABLET | Freq: Every day | ORAL | 11 refills | Status: DC

## 2021-01-16 NOTE — Progress Notes (Signed)
Patient called in complaining of nasal congestion and coughing. Symptoms started over the weekend, has been taking OTC cold and sinus medications without relief. Denies fevers or shortness of breath. Will send Zyrtec and ZPAK to pharmacy. Instructed to contact office if symptoms have not improved or worsened prior to the weekend.

## 2021-01-28 ENCOUNTER — Other Ambulatory Visit: Payer: Self-pay | Admitting: Internal Medicine

## 2021-02-11 ENCOUNTER — Ambulatory Visit: Payer: Medicare Other | Admitting: Hospice and Palliative Medicine

## 2021-02-19 ENCOUNTER — Other Ambulatory Visit: Payer: Self-pay

## 2021-02-19 ENCOUNTER — Ambulatory Visit (INDEPENDENT_AMBULATORY_CARE_PROVIDER_SITE_OTHER): Payer: Medicare Other | Admitting: Hospice and Palliative Medicine

## 2021-02-19 ENCOUNTER — Encounter: Payer: Self-pay | Admitting: Hospice and Palliative Medicine

## 2021-02-19 VITALS — BP 120/62 | HR 103 | Temp 97.8°F | Resp 16 | Ht 63.0 in | Wt 213.2 lb

## 2021-02-19 DIAGNOSIS — R059 Cough, unspecified: Secondary | ICD-10-CM

## 2021-02-19 DIAGNOSIS — R52 Pain, unspecified: Secondary | ICD-10-CM | POA: Diagnosis not present

## 2021-02-19 DIAGNOSIS — J0140 Acute pansinusitis, unspecified: Secondary | ICD-10-CM

## 2021-02-19 LAB — POCT INFLUENZA A/B
Influenza A, POC: NEGATIVE
Influenza B, POC: NEGATIVE

## 2021-02-19 LAB — POC SOFIA SARS ANTIGEN FIA: SARS Coronavirus 2 Ag: NEGATIVE

## 2021-02-19 MED ORDER — BENZONATATE 100 MG PO CAPS: 100.0000 mg | ORAL_CAPSULE | Freq: Two times a day (BID) | ORAL | 0 refills | Status: DC | PRN

## 2021-02-19 MED ORDER — DOXYCYCLINE HYCLATE 100 MG PO TABS: 100.0000 mg | ORAL_TABLET | Freq: Two times a day (BID) | ORAL | 0 refills | Status: DC

## 2021-02-19 NOTE — Progress Notes (Signed)
[facility] [place], [place] [address]  Internal MEDICINE  Office Visit Note  Patient Name: Chelsey [name]  [id number]  [id number]  Date of Service: 02/20/2021  Chief Complaint  Patient presents with  . Acute Visit    Possible sinus infection, cough, started about 3 days ago, sinus pressure, soreness in chest, chest congestion   . Quality Metric Gaps    Eye exam done in Feb.     HPI Pt is here for a sick visit. Complaining of sinus pain and pressure, nasal congestion and productive coughing  Symptoms started about 3 days ago--has not tried taking OTC medications Has not been monitoring her temperatures but reports chills and body aches Denies chest pressure, wheezing or shortness of breath  Current Medication:  Outpatient Encounter Medications as of 02/19/2021  Medication Sig Note  . benzonatate (TESSALON) 100 MG capsule Take 1 capsule (100 mg total) by mouth 2 (two) times daily as needed for cough.   . doxycycline (VIBRA-TABS) 100 MG tablet Take 1 tablet (100 mg total) by mouth 2 (two) times daily.   Marland Kitchen albuterol (VENTOLIN HFA) 108 (90 Base) MCG/ACT inhaler Inhale 2 puffs into the lungs every 6 (six) hours as needed for wheezing or shortness of breath.   . allopurinol (ZYLOPRIM) 300 MG tablet TAKE 1 TABLET BY MOUTH DAILY   . amLODipine (NORVASC) 10 MG tablet Take 1 tablet (10 mg total) by mouth daily.   Marland Kitchen aspirin EC 81 MG tablet Take 81 mg by mouth daily.   Marland Kitchen atorvastatin (LIPITOR) 10 MG tablet TAKE 1 TABLET BY MOUTH DAILY FOR CHOLESTEROL   . Blood Glucose Monitoring Suppl (ACCU-CHEK AVIVA PLUS) W/DEVICE KIT Use as directed. 12/20/2013: Received from: External Pharmacy  . cetirizine (ZYRTEC) 10 MG tablet Take 1 tablet (10 mg total) by mouth daily.   . cilostazol (PLETAL) 50 MG tablet TAKE 1 TABLET BY MOUTH TWICE A DAY   . colchicine 0.6 MG tablet TAKE 1 TO 2 TABLETS BY MOUTH DAILY AS NEEDED FOR GOUT FLARE **ONLY TAKE WITH GOUT FLARE**   . conjugated  estrogens (PREMARIN) vaginal cream Place 1 Applicatorful vaginally 2 (two) times a week.   . dapagliflozin propanediol (FARXIGA) 10 MG TABS tablet Take 1 tablet (10 mg total) by mouth daily. Take 1 tablet daily   . Dulaglutide 4.5 MG/0.5ML SOPN Inject 4.5 mg into the skin once a week.   . ferrous sulfate 325 (65 FE) MG tablet TAKE 1 TABLET BY MOUTH EVERY DAY WITH BREAKFAST   . fluticasone (FLONASE) 50 MCG/ACT nasal spray Frequency:QD   Dosage:50   MCG  Instructions:  Note:2 sprays each nostril daily Dose: 50MCG 08/21/2016: Received from: [facility]  . Fluticasone-Salmeterol (ADVAIR) 250-50 MCG/DOSE AEPB Inhale 1 puff into the lungs 2 (two) times daily.   Marland Kitchen gabapentin (NEURONTIN) 300 MG capsule TAKE 1 CAPSULE BY MOUTH 3 TIMES A DAY   . glucose blood test strip Test sugar 4 x aday for uncontrolled dm on insulin E11.22   . hydrALAZINE (APRESOLINE) 10 MG tablet TAKE 1 TABLET BY MOUTH 3 TIMES A DAY   . hydroxychloroquine (PLAQUENIL) 200 MG tablet TAKE 1 TABLET BY MOUTH TWICE A DAY   . insulin degludec (TRESIBA FLEXTOUCH) 200 UNIT/ML FlexTouch Pen 30 Units with breakfast, with lunch, and with evening meal.   . insulin lispro (HUMALOG) 100 UNIT/ML KwikPen 30 units at breakfast 35 units at lunch 30 units at dinner   . insulin regular human CONCENTRATED (HUMULIN R U-500  KWIKPEN) 500 UNIT/ML kwikpen Take insulin 60 units three times a day   . LANCETS ULTRA THIN MISC Apply 1 each topically daily.  09/20/2013: Received from: External Pharmacy  . levothyroxine (SYNTHROID) 50 MCG tablet TAKE 1 TABLET BY MOUTH IN THE MORNING ONAN EMPTY STOMACH FOR UNDERACTIVE THYROID   . linaclotide (LINZESS) 145 MCG CAPS capsule Take 1 capsule (145 mcg total) by mouth daily.   Marland Kitchen losartan (COZAAR) 50 MG tablet Take 1 tablet (50 mg total) by mouth daily. For htn   . omeprazole (PRILOSEC) 20 MG capsule Take one tab po bid for GERD   . ondansetron (ZOFRAN-ODT) 4 MG disintegrating tablet Take 1 tablet (4 mg total) by mouth every 8  (eight) hours as needed for nausea or vomiting.   . OXYGEN Inhale 3 L into the lungs. Nighttime use   . polyethylene glycol-electrolytes (NULYTELY) 420 g solution May substitute with Jaclynn Major, Nulytely, Trilytely.  Take as directed.   . potassium chloride SA (KLOR-CON) 20 MEQ tablet TAKE 1 TABLET BY MOUTH DAILY   . torsemide (DEMADEX) 10 MG tablet Take 10 mg by mouth daily.   Marland Kitchen ULTICARE SHORT PEN NEEDLES 31G X 8 MM MISC USE 3 TIMES A DAY   . [DISCONTINUED] azithromycin (ZITHROMAX Z-PAK) 250 MG tablet Take two 250 mg tablets on day one followed by one 250 mg tablet each day for four days. (Patient not taking: Reported on 02/19/2021)   . [DISCONTINUED] fluconazole (DIFLUCAN) 150 MG tablet Take 1 tab po daily for daily for 3 days    No facility-administered encounter medications on file as of 02/19/2021.      Medical History: Past Medical History:  Diagnosis Date  . Anemia   . Aneurysm ([place])   . Arthritis    RHEUMATOID ARTHRITIS  . Asthma   . Collagen vascular disease ([place])   . COPD (chronic obstructive pulmonary disease) ([place])   . Coronary artery disease   . Diabetes mellitus without complication ([place])   . Edema    FEET/LEGS  . GERD (gastroesophageal reflux disease)   . Gout   . H/O wheezing   . History of hiatal hernia   . Hypertension   . Hypothyroidism   . Neuropathy   . Oxygen deficiency    HS  . Peripheral vascular disease ([place])   . Sarcoidosis of lung ([place])   . Seizures ([place])    WITH BRAIN 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   . Sleep apnea    OXYGEN AT NIGHT 3 L [place]     Vital Signs: BP 120/62   Pulse (!) 103   Temp 97.8 F (36.6 C)   Resp 16   Ht 5' 3"  (1.6 m)   Wt 213 lb 3.2 oz (96.7 kg)   SpO2 97%   BMI 37.77 kg/m    Review of Systems  Constitutional: Positive for chills. Negative for diaphoresis and fatigue.  HENT: Positive for congestion, sinus pressure and sinus pain. Negative for ear pain and postnasal drip.   Eyes: Negative for photophobia, discharge,  redness, itching and visual disturbance.  Respiratory: Positive for choking. Negative for cough, shortness of breath and wheezing.   Cardiovascular: Negative for chest pain, palpitations and leg swelling.  Gastrointestinal: Negative for abdominal pain, constipation, diarrhea, nausea and vomiting.  Genitourinary: Negative for dysuria and flank pain.  Musculoskeletal: Negative for arthralgias, back pain, gait problem and neck pain.  Skin: Negative for color change.  Allergic/Immunologic: Negative for environmental allergies and food allergies.  Neurological: Negative for dizziness  and headaches.  Hematological: Does not bruise/bleed easily.  Psychiatric/Behavioral: Negative for agitation, behavioral problems (depression) and hallucinations.    Physical Exam Vitals reviewed.  Constitutional:      Appearance: She is normal weight. She is ill-appearing.  Cardiovascular:     Rate and Rhythm: Normal rate and regular rhythm.     Pulses: Normal pulses.     Heart sounds: Normal heart sounds.  Pulmonary:     Effort: Pulmonary effort is normal.     Breath sounds: Normal breath sounds.  Skin:    General: Skin is warm.  Neurological:     General: No focal deficit present.     Mental Status: She is alert and oriented to person, place, and time. Mental status is at baseline.  Psychiatric:        Mood and Affect: Mood normal.        Behavior: Behavior normal.        Thought Content: Thought content normal.        Judgment: Judgment normal.    Assessment/Plan: 1. Acute non-recurrent pansinusitis Review chest xray Complete course of doxycycline Advised to contact office if symptoms worsen or have not improved within 10 days Advised to rest, increase fluids and treat symptoms with Mucinex and acetaminophen - DG Chest 2 View; Future - doxycycline (VIBRA-TABS) 100 MG tablet; Take 1 tablet (100 mg total) by mouth 2 (two) times daily.  Dispense: 20 tablet; Refill: 0  2. Body aches Negative  influenza - POCT Influenza A/B  3. Cough Tessalon as needed for cough Will review CXR Influenza and COVID testing negative - DG Chest 2 View; Future - benzonatate (TESSALON) 100 MG capsule; Take 1 capsule (100 mg total) by mouth 2 (two) times daily as needed for cough.  Dispense: 20 capsule; Refill: 0 - POC SOFIA Antigen FIA - POCT Influenza A/B  General Counseling: Winter verbalizes understanding of the findings of todays visit and agrees with plan of treatment. I have discussed any further diagnostic evaluation that may be needed or ordered today. We also reviewed her medications today. she has been encouraged to call the office with any questions or concerns that should arise related to todays visit.   Orders Placed This Encounter  Procedures  . DG Chest 2 View  . POC SOFIA Antigen FIA  . POCT Influenza A/B    Meds ordered this encounter  Medications  . doxycycline (VIBRA-TABS) 100 MG tablet    Sig: Take 1 tablet (100 mg total) by mouth 2 (two) times daily.    Dispense:  20 tablet    Refill:  0  . benzonatate (TESSALON) 100 MG capsule    Sig: Take 1 capsule (100 mg total) by mouth 2 (two) times daily as needed for cough.    Dispense:  20 capsule    Refill:  0    Time spent: 30 Minutes Time spent includes review of chart, medications, test results and follow-up plan with the patient.  This patient was seen by Theodoro Grist AGNP-C in Collaboration with Dr Lavera Guise as a part of collaborative care agreement.  Tanna Furry [facility] Internal Medicine

## 2021-02-20 ENCOUNTER — Other Ambulatory Visit: Payer: Self-pay | Admitting: Hospice and Palliative Medicine

## 2021-02-20 ENCOUNTER — Encounter: Payer: Self-pay | Admitting: Hospice and Palliative Medicine

## 2021-02-24 ENCOUNTER — Other Ambulatory Visit: Payer: Self-pay | Admitting: Hospice and Palliative Medicine

## 2021-02-24 DIAGNOSIS — I739 Peripheral vascular disease, unspecified: Secondary | ICD-10-CM

## 2021-02-26 ENCOUNTER — Ambulatory Visit: Payer: Medicare Other | Admitting: Hospice and Palliative Medicine

## 2021-02-27 ENCOUNTER — Other Ambulatory Visit: Payer: Self-pay | Admitting: Internal Medicine

## 2021-02-27 ENCOUNTER — Telehealth: Payer: Self-pay

## 2021-02-27 ENCOUNTER — Encounter: Payer: Self-pay | Admitting: Hospice and Palliative Medicine

## 2021-02-27 ENCOUNTER — Ambulatory Visit (INDEPENDENT_AMBULATORY_CARE_PROVIDER_SITE_OTHER): Payer: Medicare Other | Admitting: Hospice and Palliative Medicine

## 2021-02-27 ENCOUNTER — Other Ambulatory Visit: Payer: Self-pay

## 2021-02-27 VITALS — BP 128/68 | HR 80 | Temp 97.2°F | Resp 16 | Ht 63.0 in | Wt 211.0 lb

## 2021-02-27 DIAGNOSIS — E1165 Type 2 diabetes mellitus with hyperglycemia: Secondary | ICD-10-CM | POA: Diagnosis not present

## 2021-02-27 DIAGNOSIS — N1831 Chronic kidney disease, stage 3a: Secondary | ICD-10-CM

## 2021-02-27 DIAGNOSIS — K219 Gastro-esophageal reflux disease without esophagitis: Secondary | ICD-10-CM | POA: Diagnosis not present

## 2021-02-27 DIAGNOSIS — Z794 Long term (current) use of insulin: Secondary | ICD-10-CM | POA: Diagnosis not present

## 2021-02-27 DIAGNOSIS — I10 Essential (primary) hypertension: Secondary | ICD-10-CM

## 2021-02-27 LAB — POCT GLYCOSYLATED HEMOGLOBIN (HGB A1C): Hemoglobin A1C: 9.1 % — AB (ref 4.0–5.6)

## 2021-02-27 NOTE — Progress Notes (Signed)
[facility] [address] , [place] [address]  Internal MEDICINE  Office Visit Note  Patient Name: [place]sey [name]  09/08/1951  [id number]  Date of Service: 03/04/2021  Chief Complaint  Patient presents with  . Follow-up    Heartburn   . Diabetes  . Gastroesophageal Reflux  . Sleep Apnea  . Hypertension  . Anemia  . Asthma  . COPD  . Quality Metric Gaps    Eye exam was done in march     HPI Patient is here for routine follow-up Feeling better since our last visit for acute sinusitis, congestion, coughing and breathing has improved Complaining of heartburn today--feels heartburn started when she started taking antibiotics, has a few pills of antibiotic left--feels burning in the back of her throat when she is lying flat and after meals Unsure if she has been taking omeprazole but thinks she does have this bottle at home DM-followed by endocrinology, reports that at her last visit with specialist everything was stable, no recent changes in her medications, last follow-up in March--currently on Tresiba, Lispro, Trulicity and Farxiga BP remains well controlled today  Followed by multiple other specialists  Current Medication: Outpatient Encounter Medications as of 02/27/2021  Medication Sig Note  . albuterol (VENTOLIN HFA) 108 (90 Base) MCG/ACT inhaler Inhale 2 puffs into the lungs every 6 (six) hours as needed for wheezing or shortness of breath.   . allopurinol (ZYLOPRIM) 300 MG tablet TAKE 1 TABLET BY MOUTH DAILY   . amLODipine (NORVASC) 10 MG tablet Take 1 tablet (10 mg total) by mouth daily.   . aspirin EC 81 MG tablet Take 81 mg by mouth daily.   . atorvastatin (LIPITOR) 10 MG tablet TAKE 1 TABLET BY MOUTH DAILY FOR CHOLESTEROL   . benzonatate (TESSALON) 100 MG capsule Take 1 capsule (100 mg total) by mouth 2 (two) times daily as needed for cough.   . Blood Glucose Monitoring Suppl (ACCU-CHEK AVIVA PLUS) W/DEVICE KIT Use as directed. 12/20/2013: Received  from: External Pharmacy  . cetirizine (ZYRTEC) 10 MG tablet Take 1 tablet (10 mg total) by mouth daily.   . cilostazol (PLETAL) 50 MG tablet TAKE 1 TABLET BY MOUTH TWICE A DAY   . colchicine 0.6 MG tablet TAKE 1 TO 2 TABLETS BY MOUTH DAILY AS NEEDED FOR GOUT FLARE **ONLY TAKE WITH GOUT FLARE**   . conjugated estrogens (PREMARIN) vaginal cream Place 1 Applicatorful vaginally 2 (two) times a week.   . dapagliflozin propanediol (FARXIGA) 10 MG TABS tablet Take 1 tablet (10 mg total) by mouth daily. Take 1 tablet daily   . doxycycline (VIBRA-TABS) 100 MG tablet Take 1 tablet (100 mg total) by mouth 2 (two) times daily.   . Dulaglutide 4.5 MG/0.5ML SOPN Inject 4.5 mg into the skin once a week.   . ferrous sulfate 325 (65 FE) MG tablet TAKE 1 TABLET BY MOUTH EVERY DAY WITH BREAKFAST   . fluticasone (FLONASE) 50 MCG/ACT nasal spray Frequency:QD   Dosage:50   MCG  Instructions:  Note:2 sprays each nostril daily Dose: 50MCG 08/21/2016: Received from: [facility]  . Fluticasone-Salmeterol (ADVAIR) 250-50 MCG/DOSE AEPB Inhale 1 puff into the lungs 2 (two) times daily.   . gabapentin (NEURONTIN) 300 MG capsule TAKE 1 CAPSULE BY MOUTH 3 TIMES A DAY   . glucose blood test strip Test sugar 4 x aday for uncontrolled dm on insulin E11.22   . hydrALAZINE (APRESOLINE) 10 MG tablet TAKE 1 TABLET BY MOUTH 3 TIMES A DAY   .   hydroxychloroquine (PLAQUENIL) 200 MG tablet TAKE 1 TABLET BY MOUTH TWICE A DAY   . insulin degludec (TRESIBA FLEXTOUCH) 200 UNIT/ML FlexTouch Pen 30 Units with breakfast, with lunch, and with evening meal.   . insulin lispro (HUMALOG) 100 UNIT/ML KwikPen 30 units at breakfast 35 units at lunch 30 units at dinner   . insulin regular human CONCENTRATED (HUMULIN R U-500 KWIKPEN) 500 UNIT/ML kwikpen Take insulin 60 units three times a day   . LANCETS ULTRA THIN MISC Apply 1 each topically daily.  09/20/2013: Received from: External Pharmacy  . levothyroxine (SYNTHROID) 50 MCG tablet TAKE 1 TABLET BY  MOUTH IN THE MORNING ONAN EMPTY STOMACH FOR UNDERACTIVE THYROID   . linaclotide (LINZESS) 145 MCG CAPS capsule Take 1 capsule (145 mcg total) by mouth daily.   . losartan (COZAAR) 50 MG tablet Take 1 tablet (50 mg total) by mouth daily. For htn   . omeprazole (PRILOSEC) 20 MG capsule Take one tab po bid for GERD   . ondansetron (ZOFRAN-ODT) 4 MG disintegrating tablet Take 1 tablet (4 mg total) by mouth every 8 (eight) hours as needed for nausea or vomiting.   . OXYGEN Inhale 3 L into the lungs. Nighttime use   . polyethylene glycol-electrolytes (NULYTELY) 420 g solution May substitute with Gavilyte G, Nulytely, Trilytely.  Take as directed.   . torsemide (DEMADEX) 10 MG tablet Take 10 mg by mouth daily.   . ULTICARE SHORT PEN NEEDLES 31G X 8 MM MISC USE 3 TIMES A DAY   . [DISCONTINUED] potassium chloride SA (KLOR-CON) 20 MEQ tablet TAKE 1 TABLET BY MOUTH DAILY    No facility-administered encounter medications on file as of 02/27/2021.    Surgical History: Past Surgical History:  Procedure Laterality Date  . BRAIN SURGERY  1990's  . CATARACT EXTRACTION W/PHACO Left 06/11/2016   Procedure: CATARACT EXTRACTION PHACO AND INTRAOCULAR LENS PLACEMENT (IOC);  Surgeon: William Porfilio, MD;  Location: [facility] ORS;  Service: Ophthalmology;  Laterality: Left;  US 1.01 AP% 21.6 CDE 13.23 Fluid pack lot # [id number]  . CEREBRAL ANEURYSM REPAIR  1990's   COILS  . CORONARY ARTERY BYPASS GRAFT    . EYE SURGERY  2000's   bilateral cataract  . FRACTURE SURGERY    . HERNIA REPAIR  2000   ventral  . STENT PLACEMENT VASCULAR ([facility] HX)    . VEIN BYPASS SURGERY    . VENTRAL HERNIA REPAIR N/A 04/29/2016   Procedure: Laparoscopic HERNIA REPAIR VENTRAL ADULT;  Surgeon: Diego F Pabon, MD;  Location: [facility] ORS;  Service: General;  Laterality: N/A;    Medical History: Past Medical History:  Diagnosis Date  . Anemia   . Aneurysm (HCC)   . Arthritis    RHEUMATOID ARTHRITIS  . Asthma   . Collagen vascular  disease (HCC)   . COPD (chronic obstructive pulmonary disease) (HCC)   . Coronary artery disease   . Diabetes mellitus without complication (HCC)   . Edema    FEET/LEGS  . GERD (gastroesophageal reflux disease)   . Gout   . H/O wheezing   . History of hiatal hernia   . Hypertension   . Hypothyroidism   . Neuropathy   . Oxygen deficiency    HS  . Peripheral vascular disease (HCC)   . Sarcoidosis of lung (HCC)   . Seizures (HCC)    WITH BRAIN 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   . Sleep apnea    OXYGEN AT NIGHT 3 L [place]    Family History: Family   History  Problem Relation Age of Onset  . Heart disease Mother   . Hypertension Mother   . Diabetes Mother   . Diabetes Father   . Heart disease Father   . Hypertension Father   . Breast cancer Maternal Aunt     Social History   Socioeconomic History  . Marital status: Single    Spouse name: Not on file  . Number of children: Not on file  . Years of education: Not on file  . Highest education level: Not on file  Occupational History  . Not on file  Tobacco Use  . Smoking status: Former Smoker    Packs/day: 1.00    Years: 30.00    Pack years: 30.00    Types: Cigarettes    Quit date: 01/27/2006    Years since quitting: 15.1  . Smokeless tobacco: Never Used  . Tobacco comment: quit   Substance and Sexual Activity  . Alcohol use: No    Alcohol/week: 0.0 standard drinks  . Drug use: No  . Sexual activity: Not on file  Other Topics Concern  . Not on file  Social History Narrative  . Not on file   Social Determinants of Health   Financial Resource Strain: Not on file  Food Insecurity: Not on file  Transportation Needs: Not on file  Physical Activity: Not on file  Stress: Not on file  Social Connections: Not on file  Intimate Partner Violence: Not on file      Review of Systems  Constitutional: Negative for chills, diaphoresis and fatigue.  HENT: Negative for ear pain, postnasal drip and sinus pressure.   Eyes:  Negative for photophobia, discharge, redness, itching and visual disturbance.  Respiratory: Negative for cough, shortness of breath and wheezing.   Cardiovascular: Negative for chest pain, palpitations and leg swelling.  Gastrointestinal: Negative for abdominal pain, constipation, diarrhea, nausea and vomiting.       Reflux  Genitourinary: Negative for dysuria and flank pain.  Musculoskeletal: Negative for arthralgias, back pain, gait problem and neck pain.  Skin: Negative for color change.  Allergic/Immunologic: Negative for environmental allergies and food allergies.  Neurological: Negative for dizziness and headaches.  Hematological: Does not bruise/bleed easily.  Psychiatric/Behavioral: Negative for agitation, behavioral problems (depression) and hallucinations.    Vital Signs: BP 128/68   Pulse 80   Temp (!) 97.2 F (36.2 C)   Resp 16   Ht 5' 3" (1.6 m)   Wt 211 lb (95.7 kg)   SpO2 98%   BMI 37.38 kg/m    Physical Exam Vitals reviewed.  Constitutional:      Appearance: Normal appearance. She is obese.  Cardiovascular:     Rate and Rhythm: Normal rate and regular rhythm.     Pulses: Normal pulses.     Heart sounds: Normal heart sounds.  Pulmonary:     Effort: Pulmonary effort is normal.     Breath sounds: Normal breath sounds.  Abdominal:     General: Abdomen is flat.     Palpations: Abdomen is soft.  Musculoskeletal:        General: Normal range of motion.     Cervical back: Normal range of motion.  Skin:    General: Skin is warm.  Neurological:     General: No focal deficit present.     Mental Status: She is alert and oriented to person, place, and time. Mental status is at baseline.  Psychiatric:        Mood and Affect:  Mood normal.        Behavior: Behavior normal.        Thought Content: Thought content normal.        Judgment: Judgment normal.    Assessment/Plan: 1. Gastroesophageal reflux disease without esophagitis Encouraged to start taking  omeprazole to help with GERD symptoms, may discontinue doxycycline if she feels this is worsening her GERD, sinusitis symptoms resolved  2. Essential hypertension BP and HR remain well controlled on present management, continue to monitor  3. Chronic kidney disease, stage 3a (HCC) Stable, followed by nephrology  4. Type 2 diabetes mellitus with hyperglycemia, with long-term current use of insulin (HCC) A1C 9.1--followed by endocrinology, discussed altering her medications--she is not comfortable with this until she has her next follow-up with endocrinology in May - POCT HgB A1C  General Counseling: Kylei verbalizes understanding of the findings of todays visit and agrees with plan of treatment. I have discussed any further diagnostic evaluation that may be needed or ordered today. We also reviewed her medications today. she has been encouraged to call the office with any questions or concerns that should arise related to todays visit.    Orders Placed This Encounter  Procedures  . POCT HgB A1C    Time spent: 30 Minutes Time spent includes review of chart, medications, test results and follow-up plan with the patient.  This patient was seen by   AGNP-C in Collaboration with Dr Fozia M Khan as a part of collaborative care agreement      S.  AGNP-C Internal medicine  

## 2021-02-27 NOTE — Telephone Encounter (Signed)
Pt called asking about her omeprazole, stating that Lovena Le told her to stop the medication at her 01/27/21 visit.  Per Lovena Le pt is ok to take the omeprazole but to stop the doxycycline.  I gave pt the information and she seemed to understand.

## 2021-03-04 ENCOUNTER — Encounter: Payer: Self-pay | Admitting: Hospice and Palliative Medicine

## 2021-03-11 ENCOUNTER — Other Ambulatory Visit: Payer: Self-pay | Admitting: Adult Health

## 2021-03-11 DIAGNOSIS — I10 Essential (primary) hypertension: Secondary | ICD-10-CM

## 2021-03-12 ENCOUNTER — Ambulatory Visit: Payer: Self-pay | Admitting: Hospice and Palliative Medicine

## 2021-03-12 ENCOUNTER — Other Ambulatory Visit: Payer: Self-pay | Admitting: Internal Medicine

## 2021-03-12 DIAGNOSIS — I10 Essential (primary) hypertension: Secondary | ICD-10-CM

## 2021-03-24 ENCOUNTER — Other Ambulatory Visit: Payer: Self-pay | Admitting: Internal Medicine

## 2021-03-24 DIAGNOSIS — E1165 Type 2 diabetes mellitus with hyperglycemia: Secondary | ICD-10-CM

## 2021-03-31 ENCOUNTER — Ambulatory Visit: Payer: Medicare Other | Admitting: Podiatry

## 2021-04-01 ENCOUNTER — Ambulatory Visit: Payer: 59 | Admitting: Nurse Practitioner

## 2021-04-01 ENCOUNTER — Other Ambulatory Visit: Payer: Self-pay

## 2021-04-01 ENCOUNTER — Encounter: Payer: Self-pay | Admitting: Nurse Practitioner

## 2021-04-01 DIAGNOSIS — E1165 Type 2 diabetes mellitus with hyperglycemia: Secondary | ICD-10-CM | POA: Diagnosis not present

## 2021-04-01 DIAGNOSIS — Z0000 Encounter for general adult medical examination without abnormal findings: Secondary | ICD-10-CM | POA: Diagnosis not present

## 2021-04-01 DIAGNOSIS — N1831 Chronic kidney disease, stage 3a: Secondary | ICD-10-CM | POA: Diagnosis not present

## 2021-04-01 DIAGNOSIS — Z794 Long term (current) use of insulin: Secondary | ICD-10-CM

## 2021-04-01 DIAGNOSIS — K219 Gastro-esophageal reflux disease without esophagitis: Secondary | ICD-10-CM | POA: Diagnosis not present

## 2021-04-01 NOTE — Progress Notes (Signed)
[facility] [place], [place] [address]  Internal MEDICINE  Office Visit Note  Patient Name: Chelsey [name]  [id number]  [id number]  Date of Service: 04/06/2021  Chief Complaint  Patient presents with  . Follow-up  . Diabetes  . Hypertension  . Gastroesophageal Reflux  . Quality Metric Gaps    Eye exam done in march     [name] presents for follow up visit regarding GERD, sinusitis and diabetes. She lives alone in [name], no children, not working, disabled, did a lot of different jobs when she was working.  Treated for sinusitis in April 2022, pt reports that it has resolved. She is no longer having cough and congestion, chest xray no longer needed.  -GERD has improved since she has started taking omeprazole again.  -recently had an eye exam and it was normal -a1c was 9.1 in April, managed by endocrinology. Has a follow up with endo on 5/26   Current Medication: Outpatient Encounter Medications as of 04/01/2021  Medication Sig Note  . albuterol (VENTOLIN HFA) 108 (90 Base) MCG/ACT inhaler Inhale 2 puffs into the lungs every 6 (six) hours as needed for wheezing or shortness of breath.   . allopurinol (ZYLOPRIM) 300 MG tablet TAKE 1 TABLET BY MOUTH DAILY   . amLODipine (NORVASC) 10 MG tablet Take 1 tablet (10 mg total) by mouth daily.   Marland Kitchen aspirin EC 81 MG tablet Take 81 mg by mouth daily.   Marland Kitchen atorvastatin (LIPITOR) 10 MG tablet TAKE 1 TABLET BY MOUTH DAILY FOR CHOLESTEROL   . benzonatate (TESSALON) 100 MG capsule Take 1 capsule (100 mg total) by mouth 2 (two) times daily as needed for cough.   . Blood Glucose Monitoring Suppl (ACCU-CHEK AVIVA PLUS) W/DEVICE KIT Use as directed. 12/20/2013: Received from: External Pharmacy  . cetirizine (ZYRTEC) 10 MG tablet Take 1 tablet (10 mg total) by mouth daily.   . cilostazol (PLETAL) 50 MG tablet TAKE 1 TABLET BY MOUTH TWICE A DAY   . colchicine 0.6 MG tablet TAKE 1 TO 2 TABLETS BY MOUTH DAILY AS NEEDED FOR GOUT  FLARE **ONLY TAKE WITH GOUT FLARE**   . conjugated estrogens (PREMARIN) vaginal cream Place 1 Applicatorful vaginally 2 (two) times a week.   . doxycycline (VIBRA-TABS) 100 MG tablet Take 1 tablet (100 mg total) by mouth 2 (two) times daily.   . Dulaglutide 4.5 MG/0.5ML SOPN Inject 4.5 mg into the skin once a week.   Marland Kitchen FARXIGA 10 MG TABS tablet TAKE 1 TABLET BY MOUTH DAILY   . ferrous sulfate 325 (65 FE) MG tablet TAKE 1 TABLET BY MOUTH EVERY DAY WITH BREAKFAST   . fluticasone (FLONASE) 50 MCG/ACT nasal spray Frequency:QD   Dosage:50   MCG  Instructions:  Note:2 sprays each nostril daily Dose: 50MCG 08/21/2016: Received from: [facility]  . Fluticasone-Salmeterol (ADVAIR) 250-50 MCG/DOSE AEPB Inhale 1 puff into the lungs 2 (two) times daily.   Marland Kitchen gabapentin (NEURONTIN) 300 MG capsule TAKE 1 CAPSULE BY MOUTH 3 TIMES A DAY   . glucose blood test strip Test sugar 4 x aday for uncontrolled dm on insulin E11.22   . hydrALAZINE (APRESOLINE) 10 MG tablet TAKE 1 TABLET BY MOUTH 3 TIMES A DAY   . hydroxychloroquine (PLAQUENIL) 200 MG tablet TAKE 1 TABLET BY MOUTH TWICE A DAY   . insulin degludec (TRESIBA FLEXTOUCH) 200 UNIT/ML FlexTouch Pen 30 Units with breakfast, with lunch, and with evening meal.   . insulin lispro (HUMALOG) 100  UNIT/ML KwikPen 30 units at breakfast 35 units at lunch 30 units at dinner   . insulin regular human CONCENTRATED (HUMULIN R U-500 KWIKPEN) 500 UNIT/ML kwikpen Take insulin 60 units three times a day   . LANCETS ULTRA THIN MISC Apply 1 each topically daily.  09/20/2013: Received from: External Pharmacy  . levothyroxine (SYNTHROID) 50 MCG tablet TAKE 1 TABLET BY MOUTH IN THE MORNING ONAN EMPTY STOMACH FOR UNDERACTIVE THYROID   . linaclotide (LINZESS) 145 MCG CAPS capsule Take 1 capsule (145 mcg total) by mouth daily.   Marland Kitchen losartan (COZAAR) 50 MG tablet TAKE 1 TABLET BY MOUTH DAILY FOR HYPENTENSION   . omeprazole (PRILOSEC) 20 MG capsule Take one tab po bid for GERD   .  ondansetron (ZOFRAN-ODT) 4 MG disintegrating tablet Take 1 tablet (4 mg total) by mouth every 8 (eight) hours as needed for nausea or vomiting.   . OXYGEN Inhale 3 L into the lungs. Nighttime use   . polyethylene glycol-electrolytes (NULYTELY) 420 g solution May substitute with Jaclynn Major, Nulytely, Trilytely.  Take as directed.   . potassium chloride SA (KLOR-CON) 20 MEQ tablet TAKE 1 TABLET BY MOUTH DAILY   . torsemide (DEMADEX) 10 MG tablet Take 10 mg by mouth daily.   Marland Kitchen ULTICARE SHORT PEN NEEDLES 31G X 8 MM MISC USE 3 TIMES A DAY    No facility-administered encounter medications on file as of 04/01/2021.    Surgical History: Past Surgical History:  Procedure Laterality Date  . BRAIN SURGERY  1990's  . CATARACT EXTRACTION W/PHACO Left 06/11/2016   Procedure: CATARACT EXTRACTION PHACO AND INTRAOCULAR LENS PLACEMENT (IOC);  Surgeon: Birder Robson, MD;  Location: [facility] ORS;  Service: Ophthalmology;  Laterality: Left;  [place] 1.01 AP% 21.6 CDE 13.23 Fluid pack lot # [id number]  . CEREBRAL ANEURYSM REPAIR  1990's   COILS  . CORONARY ARTERY BYPASS GRAFT    . EYE SURGERY  2000's   bilateral cataract  . FRACTURE SURGERY    . HERNIA REPAIR  2000   ventral  . STENT PLACEMENT VASCULAR ([place] HX)    . VEIN BYPASS SURGERY    . VENTRAL HERNIA REPAIR N/A 04/29/2016   Procedure: Laparoscopic HERNIA REPAIR VENTRAL ADULT;  Surgeon: Jules Husbands, MD;  Location: [facility] ORS;  Service: General;  Laterality: N/A;    Medical History: Past Medical History:  Diagnosis Date  . Anemia   . Aneurysm ([place])   . Arthritis    RHEUMATOID ARTHRITIS  . Asthma   . Collagen vascular disease ([place])   . COPD (chronic obstructive pulmonary disease) ([place])   . Coronary artery disease   . Diabetes mellitus without complication ([place])   . Edema    FEET/LEGS  . GERD (gastroesophageal reflux disease)   . Gout   . H/O wheezing   . History of hiatal hernia   . Hypertension   . Hypothyroidism   . Neuropathy   . Oxygen  deficiency    HS  . Peripheral vascular disease ([place])   . Sarcoidosis of lung ([place])   . Seizures ([place])    WITH BRAIN 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   . Sleep apnea    OXYGEN AT NIGHT 3 L [place]    Family History: Family History  Problem Relation Age of Onset  . Heart disease Mother   . Hypertension Mother   . Diabetes Mother   . Diabetes Father   . Heart disease Father   . Hypertension Father   . Breast cancer Maternal Aunt  Social History   Socioeconomic History  . Marital status: Single    Spouse name: Not on file  . Number of children: Not on file  . Years of education: Not on file  . Highest education level: Not on file  Occupational History  . Not on file  Tobacco Use  . Smoking status: Former Smoker    Packs/day: 1.00    Years: 30.00    Pack years: 30.00    Types: Cigarettes    Quit date: 01/27/2006    Years since quitting: 15.2  . Smokeless tobacco: Never Used  . Tobacco comment: quit   Substance and Sexual Activity  . Alcohol use: No    Alcohol/week: 0.0 standard drinks  . Drug use: No  . Sexual activity: Not on file  Other Topics Concern  . Not on file  Social History Narrative  . Not on file   Social Determinants of Health   Financial Resource Strain: Not on file  Food Insecurity: Not on file  Transportation Needs: Not on file  Physical Activity: Not on file  Stress: Not on file  Social Connections: Not on file  Intimate Partner Violence: Not on file      Review of Systems  Constitutional: Negative for activity change, appetite change, chills, diaphoresis, fatigue, fever and unexpected weight change.  HENT: Negative for congestion, ear discharge, ear pain, facial swelling, hearing loss, nosebleeds, postnasal drip, rhinorrhea, sinus pressure, sinus pain, sneezing, sore throat, tinnitus, trouble swallowing and voice change.   Eyes: Negative for photophobia, pain, discharge, redness, itching and visual disturbance.  Respiratory: Negative for  apnea, cough, choking, chest tightness, shortness of breath, wheezing and stridor.   Cardiovascular: Negative for chest pain, palpitations and leg swelling.  Gastrointestinal: Negative for abdominal distention, abdominal pain, anal bleeding, constipation, diarrhea, nausea and rectal pain.  Endocrine: Negative for cold intolerance, heat intolerance, polydipsia, polyphagia and polyuria.  Genitourinary: Negative for difficulty urinating, flank pain, frequency, genital sores, hematuria, menstrual problem, pelvic pain, urgency, vaginal bleeding, vaginal discharge and vaginal pain.  Musculoskeletal: Negative for arthralgias, back pain, gait problem, joint swelling, myalgias and neck pain.  Skin: Negative for color change, pallor, rash and wound.  Allergic/Immunologic: Negative for environmental allergies, food allergies and immunocompromised state.  Neurological: Negative for dizziness, seizures, syncope, facial asymmetry, speech difficulty, weakness, light-headedness, numbness and headaches.  Hematological: Negative for adenopathy. Does not bruise/bleed easily.  Psychiatric/Behavioral: Negative for agitation, behavioral problems, confusion, decreased concentration, dysphoric mood, hallucinations, self-injury, sleep disturbance and suicidal ideas. The patient is not nervous/anxious and is not hyperactive.     Vital Signs: BP 140/70   Pulse 80   Temp (!) 97.1 F (36.2 C)   Resp 16   Ht 5' 3"  (1.6 m)   Wt 212 lb 3.2 oz (96.3 kg)   SpO2 97%   BMI 37.59 kg/m    Physical Exam Constitutional:      General: She is not in acute distress.    Appearance: She is well-developed. She is not diaphoretic.  HENT:     Head: Normocephalic and atraumatic.     Mouth/Throat:     Pharynx: No oropharyngeal exudate.  Eyes:     Pupils: Pupils are equal, round, and reactive to light.  Neck:     Thyroid: No thyromegaly.     Vascular: No JVD.     Trachea: No tracheal deviation.  Cardiovascular:     Rate and  Rhythm: Normal rate and regular rhythm.     Heart sounds: Normal  heart sounds. No murmur heard. No friction rub. No gallop.   Pulmonary:     Effort: Pulmonary effort is normal. No respiratory distress.     Breath sounds: No wheezing or rales.  Chest:     Chest wall: No tenderness.  Abdominal:     General: Bowel sounds are normal.     Palpations: Abdomen is soft.  Musculoskeletal:        General: Normal range of motion.     Cervical back: Normal range of motion and neck supple.  Lymphadenopathy:     Cervical: No cervical adenopathy.  Skin:    General: Skin is warm and dry.  Neurological:     Mental Status: She is alert and oriented to person, place, and time.     Cranial Nerves: No cranial nerve deficit.  Psychiatric:        Behavior: Behavior normal.        Thought Content: Thought content normal.        Judgment: Judgment normal.    Assessment/Plan: 1. Type 2 diabetes mellitus with hyperglycemia, with long-term current use of insulin (HCC) a1c was 9.1 in April- her diabetes in managed by Dr. Sheppard Evens, [facility] endocrinology and she has a follow up appointment on 04/10/21. She declined to have her medications adjusted to decreased the a1c at a previous visit at [facility] and wants to wait until her follow up appointment with endo.  - CMP14+EGFR  2. Chronic kidney disease, stage 3a ([place]) Joelene has chronic kidney disease stage 3a. She has not had any recent labs to determine the status of her kidney disease. Renal US from 2021 negative except for renal cyst. Renal arteriogram negative for any renal artery occlusive disease in 2020. - CMP14+EGFR  3. Routine adult health maintenance Yarexi has not had routine labs drawn since January 2021. Labs ordered., will review results with patient at follow up.  - CBC with Differential/Platelet - TSH + free T4 - Vitamin D (25 hydroxy) - Lipid Profile  4. Gastroesophageal reflux disease without esophagitis Milia has GERD and is taking  omeprazole which she restarted in the past month. Her GERD has greatly improved.    General Counseling: Rainee verbalizes understanding of the findings of todays visit and agrees with plan of treatment. I have discussed any further diagnostic evaluation that may be needed or ordered today. We also reviewed her medications today. she has been encouraged to call the office with any questions or concerns that should arise related to todays visit.    Orders Placed This Encounter  Procedures  . CBC with Differential/Platelet  . CMP14+EGFR  . TSH + free T4  . Vitamin D (25 hydroxy)  . Lipid Profile    No orders of the defined types were placed in this encounter.  Return in about 2 months (around 06/01/2021) for F/U, Recheck A1C, Review labs/test, Kimoni Pickerill PCP.   Total time spent:30 Minutes Time spent includes review of chart, medications, test results, and follow up plan with the patient.   [place] Controlled Substance Database was reviewed by me.  This patient was seen by [name], FNP-C in collaboration with Dr. Clayborn Bigness as a part of collaborative care agreement.   Dr Lavera Guise Internal medicine

## 2021-04-03 ENCOUNTER — Ambulatory Visit (INDEPENDENT_AMBULATORY_CARE_PROVIDER_SITE_OTHER): Payer: Medicare Other | Admitting: Podiatry

## 2021-04-03 ENCOUNTER — Encounter: Payer: Self-pay | Admitting: Podiatry

## 2021-04-03 ENCOUNTER — Other Ambulatory Visit: Payer: Self-pay

## 2021-04-03 DIAGNOSIS — B351 Tinea unguium: Secondary | ICD-10-CM

## 2021-04-03 DIAGNOSIS — M79676 Pain in unspecified toe(s): Secondary | ICD-10-CM | POA: Diagnosis not present

## 2021-04-03 DIAGNOSIS — E1159 Type 2 diabetes mellitus with other circulatory complications: Secondary | ICD-10-CM

## 2021-04-03 DIAGNOSIS — I739 Peripheral vascular disease, unspecified: Secondary | ICD-10-CM

## 2021-04-03 DIAGNOSIS — L84 Corns and callosities: Secondary | ICD-10-CM

## 2021-04-03 NOTE — Progress Notes (Signed)
This patient returns to my office for at risk foot care.  This patient requires this care by a professional since this patient will be at risk due to having  Type 2 diabetes and peripheral vascular disease.  .  This patient is unable to cut nails herself since the patient cannot reach her nails.These nails are painful walking and wearing shoes.  This patient presents for at risk foot care today.  General Appearance  Alert, conversant and in no acute stress.  Vascular  Dorsalis pedis and posterior tibial  pulses are barely  palpable  bilaterally.  Capillary return is within normal limits  bilaterally. Temperature is within normal limits  bilaterally.  Neurologic  Senn-Weinstein monofilament wire test diminished  bilaterally. Muscle power within normal limits bilaterally.  Nails Thick disfigured discolored nails with subungual debris  from hallux to fifth toes bilaterally. No evidence of bacterial infection or drainage bilaterally.  Orthopedic  No limitations of motion  feet .  No crepitus or effusions noted.  No bony pathology or digital deformities noted.  Pes planus and HAV  B/L.  DJD STJ left foot.  Skin  normotropic skin with no porokeratosis noted bilaterally.  No signs of infections or ulcers noted.   Pinch callus  Left foot  Thin shiny skin  Onychomycosis  Pain in right toes  Pain in left toes  Pinch callus left hallux.  Consent was obtained for treatment procedures.   Mechanical debridement of nails 1-5  bilaterally performed with a nail nipper.  Filed with dremel without incident. Debrided callus with # 15 blade.     Return office visit    3 months                  Told patient to return for periodic foot care and evaluation due to potential at risk complications.   Gardiner Barefoot DPM

## 2021-04-16 ENCOUNTER — Other Ambulatory Visit: Payer: Self-pay

## 2021-04-16 MED ORDER — COLCHICINE 0.6 MG PO TABS: ORAL_TABLET | ORAL | 3 refills | Status: DC

## 2021-04-17 ENCOUNTER — Other Ambulatory Visit: Payer: Self-pay | Admitting: Internal Medicine

## 2021-04-30 ENCOUNTER — Other Ambulatory Visit: Payer: Self-pay | Admitting: Internal Medicine

## 2021-05-02 ENCOUNTER — Other Ambulatory Visit: Payer: Self-pay

## 2021-05-02 DIAGNOSIS — I739 Peripheral vascular disease, unspecified: Secondary | ICD-10-CM

## 2021-05-02 MED ORDER — CILOSTAZOL 50 MG PO TABS: 50.0000 mg | ORAL_TABLET | Freq: Two times a day (BID) | ORAL | 0 refills | Status: DC

## 2021-05-26 DIAGNOSIS — M8580 Other specified disorders of bone density and structure, unspecified site: Secondary | ICD-10-CM | POA: Diagnosis not present

## 2021-05-26 DIAGNOSIS — M109 Gout, unspecified: Secondary | ICD-10-CM | POA: Diagnosis not present

## 2021-05-26 DIAGNOSIS — M1990 Unspecified osteoarthritis, unspecified site: Secondary | ICD-10-CM | POA: Diagnosis not present

## 2021-05-26 DIAGNOSIS — D869 Sarcoidosis, unspecified: Secondary | ICD-10-CM | POA: Diagnosis not present

## 2021-05-28 ENCOUNTER — Other Ambulatory Visit: Payer: Self-pay

## 2021-05-28 ENCOUNTER — Ambulatory Visit (INDEPENDENT_AMBULATORY_CARE_PROVIDER_SITE_OTHER): Payer: Medicare Other | Admitting: Nurse Practitioner

## 2021-05-28 ENCOUNTER — Encounter: Payer: Self-pay | Admitting: Nurse Practitioner

## 2021-05-28 VITALS — BP 128/78 | HR 73 | Temp 97.3°F | Resp 16 | Ht 65.0 in | Wt 215.2 lb

## 2021-05-28 DIAGNOSIS — Z0189 Encounter for other specified special examinations: Secondary | ICD-10-CM

## 2021-05-28 DIAGNOSIS — I10 Essential (primary) hypertension: Secondary | ICD-10-CM

## 2021-05-28 DIAGNOSIS — M109 Gout, unspecified: Secondary | ICD-10-CM | POA: Diagnosis not present

## 2021-05-28 DIAGNOSIS — D860 Sarcoidosis of lung: Secondary | ICD-10-CM | POA: Diagnosis not present

## 2021-05-28 DIAGNOSIS — N186 End stage renal disease: Secondary | ICD-10-CM

## 2021-05-28 DIAGNOSIS — Z23 Encounter for immunization: Secondary | ICD-10-CM

## 2021-05-28 DIAGNOSIS — M8580 Other specified disorders of bone density and structure, unspecified site: Secondary | ICD-10-CM | POA: Diagnosis not present

## 2021-05-28 DIAGNOSIS — D869 Sarcoidosis, unspecified: Secondary | ICD-10-CM | POA: Diagnosis not present

## 2021-05-28 DIAGNOSIS — E1122 Type 2 diabetes mellitus with diabetic chronic kidney disease: Secondary | ICD-10-CM

## 2021-05-28 MED ORDER — ZOSTER VAC RECOMB ADJUVANTED 50 MCG/0.5ML IM SUSR: 0.5000 mL | Freq: Once | INTRAMUSCULAR | 0 refills | Status: AC

## 2021-05-28 NOTE — Progress Notes (Signed)
[facility] [place], [place] [address]  Internal MEDICINE  Office Visit Note  Patient Name: Chelsey [name]  [id number]  [id number]  Date of Service: 05/28/2021  Chief Complaint  Patient presents with   Follow-up    Review results   Diabetes   Gastroesophageal Reflux   Sleep Apnea   Hypertension   Anemia   Asthma   COPD    HPI Chelsey [name] presents for follow up to review labs but has not had labs drawn yet. The labs were ordered on may 17th. She sees [facility] endocrinology for dibetes so they checked her A1C on may 26th. A1C does not need to be checked at this time.  After discussing getting the labs drawn, Chelsey [name] states that she has not eaten anything today so she will head to [facility] right after her appointment is over here.  She was recently seen by the rheumatologist who she said is also planning on ordering additional labs.   Current Medication: Outpatient Encounter Medications as of 05/28/2021  Medication Sig Note   albuterol (VENTOLIN HFA) 108 (90 Base) MCG/ACT inhaler Inhale 2 puffs into the lungs every 6 (six) hours as needed for wheezing or shortness of breath.    allopurinol (ZYLOPRIM) 300 MG tablet TAKE 1 TABLET BY MOUTH DAILY    amLODipine (NORVASC) 10 MG tablet Take 1 tablet (10 mg total) by mouth daily.    aspirin EC 81 MG tablet Take 81 mg by mouth daily.    atorvastatin (LIPITOR) 10 MG tablet TAKE 1 TABLET BY MOUTH DAILY FOR CHOLESTEROL    benzonatate (TESSALON) 100 MG capsule Take 1 capsule (100 mg total) by mouth 2 (two) times daily as needed for cough.    Blood Glucose Monitoring Suppl (ACCU-CHEK AVIVA PLUS) W/DEVICE KIT Use as directed. 12/20/2013: Received from: External Pharmacy   cetirizine (ZYRTEC) 10 MG tablet Take 1 tablet (10 mg total) by mouth daily.    cilostazol (PLETAL) 50 MG tablet Take 1 tablet (50 mg total) by mouth 2 (two) times daily.    colchicine 0.6 MG tablet TAKE 1 TO 2 TABLETS BY MOUTH DAILY AS NEEDED FOR GOUT FLARE **ONLY TAKE  WITH GOUT FLARE**    conjugated estrogens (PREMARIN) vaginal cream Place 1 Applicatorful vaginally 2 (two) times a week.    FARXIGA 10 MG TABS tablet TAKE 1 TABLET BY MOUTH DAILY    ferrous sulfate 325 (65 FE) MG tablet TAKE 1 TABLET BY MOUTH EVERY DAY WITH BREAKFAST    fluticasone (FLONASE) 50 MCG/ACT nasal spray Frequency:QD   Dosage:50   MCG  Instructions:  Note:2 sprays each nostril daily Dose: 50MCG 08/21/2016: Received from: [facility]   Fluticasone-Salmeterol (ADVAIR) 250-50 MCG/DOSE AEPB Inhale 1 puff into the lungs 2 (two) times daily.    gabapentin (NEURONTIN) 300 MG capsule TAKE 1 CAPSULE BY MOUTH 3 TIMES A DAY    glucose blood test strip Test sugar 4 x aday for uncontrolled dm on insulin E11.22    hydrALAZINE (APRESOLINE) 10 MG tablet TAKE 1 TABLET BY MOUTH 3 TIMES A DAY    hydroxychloroquine (PLAQUENIL) 200 MG tablet TAKE 1 TABLET BY MOUTH TWICE A DAY    insulin degludec (TRESIBA FLEXTOUCH) 200 UNIT/ML FlexTouch Pen 30 Units with breakfast, with lunch, and with evening meal.    insulin lispro (HUMALOG) 100 UNIT/ML KwikPen 30 units at breakfast 35 units at lunch 30 units at dinner    insulin regular human CONCENTRATED (HUMULIN R U-500 KWIKPEN) 500 UNIT/ML kwikpen Take  insulin 60 units three times a day    LANCETS ULTRA THIN MISC Apply 1 each topically daily.  09/20/2013: Received from: External Pharmacy   levothyroxine (SYNTHROID) 50 MCG tablet TAKE 1 TABLET BY MOUTH IN THE MORNING ONAN EMPTY STOMACH FOR UNDERACTIVE THYROID    linaclotide (LINZESS) 145 MCG CAPS capsule Take 1 capsule (145 mcg total) by mouth daily.    losartan (COZAAR) 50 MG tablet TAKE 1 TABLET BY MOUTH DAILY FOR HYPENTENSION    omeprazole (PRILOSEC) 20 MG capsule Take one tab po bid for GERD    ondansetron (ZOFRAN-ODT) 4 MG disintegrating tablet Take 1 tablet (4 mg total) by mouth every 8 (eight) hours as needed for nausea or vomiting.    OXYGEN Inhale 3 L into the lungs. Nighttime use    potassium chloride SA  (KLOR-CON) 20 MEQ tablet TAKE 1 TABLET BY MOUTH DAILY    torsemide (DEMADEX) 10 MG tablet Take 10 mg by mouth daily.    ULTICARE SHORT PEN NEEDLES 31G X 8 MM MISC USE 3 TIMES A DAY    [DISCONTINUED] Zoster Vaccine Adjuvanted [facility]) injection Inject 0.5 mLs into the muscle once.    Zoster Vaccine Adjuvanted [facility]) injection Inject 0.5 mLs into the muscle once for 1 dose.    [DISCONTINUED] doxycycline (VIBRA-TABS) 100 MG tablet Take 1 tablet (100 mg total) by mouth 2 (two) times daily. (Patient not taking: Reported on 05/28/2021)    [DISCONTINUED] Dulaglutide 4.5 MG/0.5ML SOPN Inject 4.5 mg into the skin once a week. (Patient not taking: Reported on 05/28/2021)    [DISCONTINUED] polyethylene glycol-electrolytes (NULYTELY) 420 g solution May substitute with Jaclynn Major, Nulytely, Trilytely.  Take as directed. (Patient not taking: Reported on 05/28/2021)    No facility-administered encounter medications on file as of 05/28/2021.    Surgical History: Past Surgical History:  Procedure Laterality Date   BRAIN SURGERY  1990's   CATARACT EXTRACTION W/PHACO Left 06/11/2016   Procedure: CATARACT EXTRACTION PHACO AND INTRAOCULAR LENS PLACEMENT (IOC);  Surgeon: Birder Robson, MD;  Location: [facility] ORS;  Service: Ophthalmology;  Laterality: Left;  [place] 1.01 AP% 21.6 CDE 13.23 Fluid pack lot # [id number]   CEREBRAL ANEURYSM REPAIR  1990's   COILS   CORONARY ARTERY BYPASS GRAFT     EYE SURGERY  2000's   bilateral cataract   FRACTURE SURGERY     HERNIA REPAIR  2000   ventral   STENT PLACEMENT VASCULAR ([place] HX)     VEIN BYPASS SURGERY     VENTRAL HERNIA REPAIR N/A 04/29/2016   Procedure: Laparoscopic HERNIA REPAIR VENTRAL ADULT;  Surgeon: Jules Husbands, MD;  Location: [facility] ORS;  Service: General;  Laterality: N/A;    Medical History: Past Medical History:  Diagnosis Date   Anemia    Aneurysm ([place])    Arthritis    RHEUMATOID ARTHRITIS   Asthma    Collagen vascular disease ([place])    COPD  (chronic obstructive pulmonary disease) ([place])    Coronary artery disease    Diabetes mellitus without complication (HCC)    Edema    FEET/LEGS   GERD (gastroesophageal reflux disease)    Gout    H/O wheezing    History of hiatal hernia    Hypertension    Hypothyroidism    Neuropathy    Oxygen deficiency    HS   Peripheral vascular disease (HCC)    Sarcoidosis of lung (HCC)    Seizures (HCC)    WITH BRAIN 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  Sleep apnea    OXYGEN AT NIGHT 3 L [place]    Family History: Family History  Problem Relation Age of Onset   Heart disease Mother    Hypertension Mother    Diabetes Mother    Diabetes Father    Heart disease Father    Hypertension Father    Breast cancer Maternal Aunt     Social History   Socioeconomic History   Marital status: Single    Spouse name: Not on file   Number of children: Not on file   Years of education: Not on file   Highest education level: Not on file  Occupational History   Not on file  Tobacco Use   Smoking status: Former    Packs/day: 1.00    Years: 30.00    Pack years: 30.00    Types: Cigarettes    Quit date: 01/27/2006    Years since quitting: 15.3   Smokeless tobacco: Never   Tobacco comments:    quit   Substance and Sexual Activity   Alcohol use: No    Alcohol/week: 0.0 standard drinks   Drug use: No   Sexual activity: Not on file  Other Topics Concern   Not on file  Social History Narrative   Not on file   Social Determinants of Health   Financial Resource Strain: Not on file  Food Insecurity: Not on file  Transportation Needs: Not on file  Physical Activity: Not on file  Stress: Not on file  Social Connections: Not on file  Intimate Partner Violence: Not on file      Review of Systems  Constitutional:  Negative for chills, fatigue and unexpected weight change.  HENT:  Negative for congestion, rhinorrhea, sneezing and sore throat.   Eyes:  Negative for redness.  Respiratory:  Negative  for cough, chest tightness and shortness of breath.   Cardiovascular:  Negative for chest pain and palpitations.  Gastrointestinal:  Negative for abdominal pain, constipation, diarrhea, nausea and vomiting.  Genitourinary:  Negative for dysuria and frequency.  Musculoskeletal:  Negative for arthralgias, back pain, joint swelling and neck pain.  Skin:  Negative for rash.  Neurological: Negative.  Negative for tremors and numbness.  Hematological:  Negative for adenopathy. Does not bruise/bleed easily.  Psychiatric/Behavioral:  Negative for behavioral problems (Depression), sleep disturbance and suicidal ideas. The patient is not nervous/anxious.    Vital Signs: BP 128/78   Pulse 73   Temp (!) 97.3 F (36.3 C)   Resp 16   Ht 5' 5"  (1.651 m)   Wt 215 lb 3.2 oz (97.6 kg)   SpO2 96%   BMI 35.81 kg/m    Physical Exam Vitals reviewed.  Constitutional:      General: She is not in acute distress.    Appearance: Normal appearance. She is obese. She is not ill-appearing.  HENT:     Head: Normocephalic and atraumatic.  Cardiovascular:     Rate and Rhythm: Normal rate and regular rhythm.  Pulmonary:     Effort: Pulmonary effort is normal. No respiratory distress.  Neurological:     Mental Status: She is alert and oriented to person, place, and time.  Psychiatric:        Mood and Affect: Mood normal.        Behavior: Behavior normal.     Assessment/Plan: 1. Encounter for routine laboratory testing Patient reminded to get her labs drawn, she is fasting now so she plans to leave the clinic after her office  visit and go straight to [facility] to get them drawn.   2. Essential hypertension Stable on current medications, continue as prescribed, no changes, no refills needed.   3. 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[place]) Managed by [facility] endocrinology, last A1C is 9.3  4. Sarcoidosis of lung ([place]) Managed by rheumatology  5. Severe obesity (BMI 35.0-39.9) with  comorbidity [facility]) Patient has several comorbidities, she is managed by more than 1 specialist. Controlling her diabetes will help decrease her weight, patient understands what types of foods she should eat and the types of food she needs to decrease in her diet. She continues to work on diet modifications.   6. Need for shingles vaccine Shingles vaccine order sent to pharamacy.  - Zoster Vaccine Adjuvanted [facility]) injection; Inject 0.5 mLs into the muscle once for 1 dose.  Dispense: 0.5 mL; Refill: 0   General Counseling: Azula verbalizes understanding of the findings of todays visit and agrees with plan of treatment. I have discussed any further diagnostic evaluation that may be needed or ordered today. We also reviewed her medications today. she has been encouraged to call the office with any questions or concerns that should arise related to todays visit.    No orders of the defined types were placed in this encounter.   Meds ordered this encounter  Medications   Zoster Vaccine Adjuvanted [facility]) injection    Sig: Inject 0.5 mLs into the muscle once for 1 dose.    Dispense:  0.5 mL    Refill:  0    Return for previously scheduled appointment in october with , [place] PCP.   Total time spent:30 Minutes Time spent includes review of chart, medications, test results, and follow up plan with the patient.   [place] Controlled Substance Database was reviewed by me.  This patient was seen by [name], FNP-C in collaboration with Dr. Clayborn Bigness as a part of collaborative care agreement.   Ashayla Subia R. Valetta Fuller, MSN, FNP-C Internal medicine

## 2021-05-31 DIAGNOSIS — J449 Chronic obstructive pulmonary disease, unspecified: Secondary | ICD-10-CM | POA: Diagnosis not present

## 2021-06-07 ENCOUNTER — Other Ambulatory Visit: Payer: Self-pay | Admitting: Internal Medicine

## 2021-06-07 DIAGNOSIS — K219 Gastro-esophageal reflux disease without esophagitis: Secondary | ICD-10-CM

## 2021-06-07 DIAGNOSIS — E118 Type 2 diabetes mellitus with unspecified complications: Secondary | ICD-10-CM | POA: Diagnosis not present

## 2021-06-07 DIAGNOSIS — Z794 Long term (current) use of insulin: Secondary | ICD-10-CM | POA: Diagnosis not present

## 2021-06-09 NOTE — Addendum Note (Signed)
Addended by: Lavera Guise on: 06/09/2021 10:50 AM   Modules accepted: Level of Service

## 2021-06-20 ENCOUNTER — Telehealth: Payer: Self-pay

## 2021-06-20 NOTE — Telephone Encounter (Signed)
Spoke with pt, pt was told by a different doctor she didn't have to do the cologuard. I checked pt's health maintenance and confirmed with pt that she had a colonoscopy 2018 and is not due for 10 years

## 2021-06-24 ENCOUNTER — Other Ambulatory Visit: Payer: Self-pay | Admitting: Internal Medicine

## 2021-06-24 DIAGNOSIS — I739 Peripheral vascular disease, unspecified: Secondary | ICD-10-CM

## 2021-07-01 DIAGNOSIS — J449 Chronic obstructive pulmonary disease, unspecified: Secondary | ICD-10-CM | POA: Diagnosis not present

## 2021-07-02 ENCOUNTER — Other Ambulatory Visit: Payer: Self-pay

## 2021-07-02 DIAGNOSIS — I10 Essential (primary) hypertension: Secondary | ICD-10-CM

## 2021-07-02 MED ORDER — HYDRALAZINE HCL 10 MG PO TABS: 10.0000 mg | ORAL_TABLET | Freq: Three times a day (TID) | ORAL | 3 refills | Status: DC

## 2021-07-08 DIAGNOSIS — Z794 Long term (current) use of insulin: Secondary | ICD-10-CM | POA: Diagnosis not present

## 2021-07-08 DIAGNOSIS — E118 Type 2 diabetes mellitus with unspecified complications: Secondary | ICD-10-CM | POA: Diagnosis not present

## 2021-07-10 ENCOUNTER — Ambulatory Visit: Payer: Medicare Other | Admitting: Podiatry

## 2021-07-17 ENCOUNTER — Encounter: Payer: Self-pay | Admitting: Podiatry

## 2021-07-17 ENCOUNTER — Other Ambulatory Visit: Payer: Self-pay

## 2021-07-17 ENCOUNTER — Ambulatory Visit (INDEPENDENT_AMBULATORY_CARE_PROVIDER_SITE_OTHER): Payer: Medicare Other | Admitting: Podiatry

## 2021-07-17 DIAGNOSIS — M79676 Pain in unspecified toe(s): Secondary | ICD-10-CM | POA: Diagnosis not present

## 2021-07-17 DIAGNOSIS — B351 Tinea unguium: Secondary | ICD-10-CM

## 2021-07-17 DIAGNOSIS — E1159 Type 2 diabetes mellitus with other circulatory complications: Secondary | ICD-10-CM

## 2021-07-17 DIAGNOSIS — I739 Peripheral vascular disease, unspecified: Secondary | ICD-10-CM | POA: Diagnosis not present

## 2021-07-17 NOTE — Progress Notes (Signed)
This patient returns to my office for at risk foot care.  This patient requires this care by a professional since this patient will be at risk due to having  Type 2 diabetes and peripheral vascular disease.  .  This patient is unable to cut nails herself since the patient cannot reach her nails.These nails are painful walking and wearing shoes.  This patient presents for at risk foot care today.  General Appearance  Alert, conversant and in no acute stress.  Vascular  Dorsalis pedis and posterior tibial  pulses are barely  palpable  bilaterally.  Capillary return is within normal limits  bilaterally. Temperature is within normal limits  bilaterally.  Neurologic  Senn-Weinstein monofilament wire test diminished  bilaterally. Muscle power within normal limits bilaterally.  Nails Thick disfigured discolored nails with subungual debris  from hallux to fifth toes bilaterally. No evidence of bacterial infection or drainage bilaterally.  Orthopedic  No limitations of motion  feet .  No crepitus or effusions noted.  No bony pathology or digital deformities noted.  Pes planus and HAV  B/L.  DJD STJ left foot.  Skin  normotropic skin with no porokeratosis noted bilaterally.  No signs of infections or ulcers noted.   Pinch callus  left foot  Thin shiny skin  Onychomycosis  Pain in right toes  Pain in left toes    Consent was obtained for treatment procedures.   Mechanical debridement of nails 1-5  bilaterally performed with a nail nipper.  Filed with dremel without incident.     Return office visit    3 months                  Told patient to return for periodic foot care and evaluation due to potential at risk complications.   Gardiner Barefoot DPM

## 2021-08-01 DIAGNOSIS — J449 Chronic obstructive pulmonary disease, unspecified: Secondary | ICD-10-CM | POA: Diagnosis not present

## 2021-08-07 ENCOUNTER — Ambulatory Visit: Payer: Medicare Other | Admitting: Podiatry

## 2021-08-07 ENCOUNTER — Other Ambulatory Visit: Payer: Self-pay | Admitting: Internal Medicine

## 2021-08-08 DIAGNOSIS — Z794 Long term (current) use of insulin: Secondary | ICD-10-CM | POA: Diagnosis not present

## 2021-08-08 DIAGNOSIS — E118 Type 2 diabetes mellitus with unspecified complications: Secondary | ICD-10-CM | POA: Diagnosis not present

## 2021-08-18 DIAGNOSIS — E119 Type 2 diabetes mellitus without complications: Secondary | ICD-10-CM | POA: Diagnosis not present

## 2021-08-27 ENCOUNTER — Other Ambulatory Visit: Payer: Self-pay

## 2021-08-27 ENCOUNTER — Encounter: Payer: Self-pay | Admitting: Nurse Practitioner

## 2021-08-27 ENCOUNTER — Encounter (INDEPENDENT_AMBULATORY_CARE_PROVIDER_SITE_OTHER): Payer: Self-pay

## 2021-08-27 ENCOUNTER — Ambulatory Visit (INDEPENDENT_AMBULATORY_CARE_PROVIDER_SITE_OTHER): Payer: Medicare Other | Admitting: Nurse Practitioner

## 2021-08-27 VITALS — BP 130/65 | HR 64 | Temp 98.4°F | Resp 16 | Ht 63.0 in | Wt 210.8 lb

## 2021-08-27 DIAGNOSIS — E1122 Type 2 diabetes mellitus with diabetic chronic kidney disease: Secondary | ICD-10-CM | POA: Diagnosis not present

## 2021-08-27 DIAGNOSIS — E782 Mixed hyperlipidemia: Secondary | ICD-10-CM | POA: Diagnosis not present

## 2021-08-27 DIAGNOSIS — Z0001 Encounter for general adult medical examination with abnormal findings: Secondary | ICD-10-CM

## 2021-08-27 DIAGNOSIS — E559 Vitamin D deficiency, unspecified: Secondary | ICD-10-CM | POA: Diagnosis not present

## 2021-08-27 DIAGNOSIS — D860 Sarcoidosis of lung: Secondary | ICD-10-CM | POA: Diagnosis not present

## 2021-08-27 DIAGNOSIS — R300 Dysuria: Secondary | ICD-10-CM

## 2021-08-27 DIAGNOSIS — N186 End stage renal disease: Secondary | ICD-10-CM

## 2021-08-27 DIAGNOSIS — I10 Essential (primary) hypertension: Secondary | ICD-10-CM | POA: Diagnosis not present

## 2021-08-27 DIAGNOSIS — Z23 Encounter for immunization: Secondary | ICD-10-CM

## 2021-08-27 MED ORDER — ZOSTER VAC RECOMB ADJUVANTED 50 MCG/0.5ML IM SUSR: 0.5000 mL | Freq: Once | INTRAMUSCULAR | 0 refills | Status: AC

## 2021-08-27 NOTE — Progress Notes (Signed)
[facility] [place], [place] [address]  Internal MEDICINE  Office Visit Note  Patient Name: Chelsey [name]  [id number]  [id number]  Date of Service: 08/27/2021  Chief Complaint  Patient presents with   Medicare Wellness   Diabetes   Gastroesophageal Reflux   Sleep Apnea   Hypertension   Anemia   Asthma   COPD    HPI Chelsey [name] presents for an annual well visit and physical exam. She has hypertension, diabetes, asthma, COPD, hypothyroidism, PVD, rheumatoid arthritis and sarcoidosis of the lung. Her surgical history is significant for cerebral aneurysm repair in 1990, CABG, cataract extraction, and a ventral hernia repair.  She had labs ordered in may but had not had them drawn yet. Her diabetes is managed by endocrinology She has not had her A1C checked since may 2022. She reports that she has missed a couple of appointments with her endocrinologist and needs to call them. When offered to have her A1C checked in the office today she declined and stated she will call her endocrinologist.  She also has rheumatoid arthritis and sarcoidosis of the lung and is followed by rheumatology. Her bone density scan showed osteopenia in December 2021. She had a carotid ultrasound and echocardiogram in October last year. Her colonoscopy was done in 2018. Her last mammogram was in January last year, she declined having a mammogram this year.    Current Medication: Outpatient Encounter Medications as of 08/27/2021  Medication Sig Note   albuterol (VENTOLIN HFA) 108 (90 Base) MCG/ACT inhaler Inhale 2 puffs into the lungs every 6 (six) hours as needed for wheezing or shortness of breath.    allopurinol (ZYLOPRIM) 300 MG tablet TAKE 1 TABLET BY MOUTH DAILY    allopurinol (ZYLOPRIM) 300 MG tablet Take 1 tablet by mouth daily.    allopurinol (ZYLOPRIM) 300 MG tablet TAKE 1 TABLET BY MOUTH DAILY    amLODipine (NORVASC) 10 MG tablet Take 1 tablet (10 mg total) by mouth daily.     amLODipine (NORVASC) 10 MG tablet Take 1 tablet by mouth daily.    aspirin 81 MG EC tablet Take by mouth.    aspirin EC 81 MG tablet Take 81 mg by mouth daily.    atorvastatin (LIPITOR) 10 MG tablet TAKE 1 TABLET BY MOUTH DAILY FOR CHOLESTEROL    atorvastatin (LIPITOR) 10 MG tablet Take by mouth.    Blood Glucose Monitoring Suppl (ACCU-CHEK AVIVA PLUS) W/DEVICE KIT Use as directed. 12/20/2013: Received from: External Pharmacy   celecoxib (CELEBREX) 100 MG capsule Take by mouth.    cetirizine (ZYRTEC) 10 MG tablet Take 1 tablet (10 mg total) by mouth daily.    cetirizine (ZYRTEC) 10 MG tablet Take 1 tablet by mouth daily.    Cholecalciferol 25 MCG (1000 UT) capsule Take by mouth.    cilostazol (PLETAL) 50 MG tablet TAKE 1 TABLET BY MOUTH TWICE A DAY    colchicine 0.6 MG tablet TAKE 1 TO 2 TABLETS BY MOUTH DAILY AS NEEDED FOR GOUT FLARE **ONLY TAKE WITH GOUT FLARE**    conjugated estrogens (PREMARIN) vaginal cream Place 1 Applicatorful vaginally 2 (two) times a week.    diclofenac Sodium (VOLTAREN) 1 % GEL Apply topically.    Dulaglutide 1.5 MG/0.5ML SOPN Inject into the skin.    ezetimibe (ZETIA) 10 MG tablet Take by mouth.    FARXIGA 10 MG TABS tablet TAKE 1 TABLET BY MOUTH DAILY    ferrous sulfate 325 (65 FE) MG tablet TAKE 1 TABLET BY  MOUTH EVERY DAY WITH BREAKFAST    fluticasone (FLONASE) 50 MCG/ACT nasal spray Frequency:QD   Dosage:50   MCG  Instructions:  Note:2 sprays each nostril daily Dose: 50MCG 08/21/2016: Received from: [facility]   Fluticasone-Salmeterol (ADVAIR) 250-50 MCG/DOSE AEPB Inhale 1 puff into the lungs 2 (two) times daily.    gabapentin (NEURONTIN) 300 MG capsule TAKE 1 CAPSULE BY MOUTH 3 TIMES A DAY    gabapentin (NEURONTIN) 300 MG capsule Take 1 capsule by mouth 3 (three) times daily.    gabapentin (NEURONTIN) 300 MG capsule TAKE 1 CAPSULE BY MOUTH 3 TIMES A DAY    glucose blood test strip Test sugar 4 x aday for uncontrolled dm on insulin E11.22    hydrALAZINE  (APRESOLINE) 10 MG tablet Take 1 tablet (10 mg total) by mouth 3 (three) times daily.    hydrALAZINE (APRESOLINE) 10 MG tablet Take by mouth.    hydroxychloroquine (PLAQUENIL) 200 MG tablet TAKE 1 TABLET BY MOUTH TWICE A DAY    hydroxychloroquine (PLAQUENIL) 200 MG tablet Take by mouth.    insulin degludec (TRESIBA FLEXTOUCH) 200 UNIT/ML FlexTouch Pen 30 Units with breakfast, with lunch, and with evening meal.    insulin lispro (HUMALOG) 100 UNIT/ML KwikPen 30 units at breakfast 35 units at lunch 30 units at dinner    insulin regular human CONCENTRATED (HUMULIN R U-500 KWIKPEN) 500 UNIT/ML kwikpen Take insulin 60 units three times a day    LANCETS ULTRA THIN MISC Apply 1 each topically daily.  09/20/2013: Received from: External Pharmacy   levothyroxine (SYNTHROID) 50 MCG tablet TAKE 1 TABLET BY MOUTH IN THE MORNING ONAN EMPTY STOMACH FOR UNDERACTIVE THYROID    levothyroxine (SYNTHROID) 50 MCG tablet Take by mouth.    linaclotide (LINZESS) 145 MCG CAPS capsule Take 1 capsule (145 mcg total) by mouth daily.    losartan (COZAAR) 50 MG tablet TAKE 1 TABLET BY MOUTH DAILY FOR HYPENTENSION    losartan (COZAAR) 50 MG tablet Take by mouth.    omeprazole (PRILOSEC) 20 MG capsule TAKE ONE CAPSULE BY MOUTH TWICE A DAY FOR GERD    omeprazole (PRILOSEC) 20 MG capsule Take by mouth.    OXYGEN Inhale 3 L into the lungs. Nighttime use    pentoxifylline (TRENTAL) 400 MG CR tablet Take by mouth.    potassium chloride SA (KLOR-CON) 20 MEQ tablet TAKE 1 TABLET BY MOUTH DAILY    simvastatin (ZOCOR) 20 MG tablet Take by mouth.    torsemide (DEMADEX) 10 MG tablet Take 10 mg by mouth daily.    ULTICARE SHORT PEN NEEDLES 31G X 8 MM MISC USE 3 TIMES A DAY    [DISCONTINUED] Zoster Vaccine Adjuvanted [facility]) injection Inject 0.5 mLs into the muscle once.    [EXPIRED] Zoster Vaccine Adjuvanted [facility]) injection Inject 0.5 mLs into the muscle once for 1 dose.    [DISCONTINUED] benzonatate (TESSALON) 100 MG capsule  Take 1 capsule (100 mg total) by mouth 2 (two) times daily as needed for cough. (Patient not taking: Reported on 08/27/2021)    [DISCONTINUED] ondansetron (ZOFRAN-ODT) 4 MG disintegrating tablet Take 1 tablet (4 mg total) by mouth every 8 (eight) hours as needed for nausea or vomiting. (Patient not taking: Reported on 08/27/2021)    No facility-administered encounter medications on file as of 08/27/2021.    Surgical History: Past Surgical History:  Procedure Laterality Date   BRAIN SURGERY  1990's   CATARACT EXTRACTION W/PHACO Left 06/11/2016   Procedure: CATARACT EXTRACTION PHACO AND INTRAOCULAR LENS PLACEMENT (  [place]);  Surgeon: Birder Robson, MD;  Location: [facility] ORS;  Service: Ophthalmology;  Laterality: Left;  [place] 1.01 AP% 21.6 CDE 13.23 Fluid pack lot # [id number]   CEREBRAL ANEURYSM REPAIR  1990's   COILS   CORONARY ARTERY BYPASS GRAFT     EYE SURGERY  2000's   bilateral cataract   FRACTURE SURGERY     HERNIA REPAIR  2000   ventral   STENT PLACEMENT VASCULAR ([place] HX)     VEIN BYPASS SURGERY     VENTRAL HERNIA REPAIR N/A 04/29/2016   Procedure: Laparoscopic HERNIA REPAIR VENTRAL ADULT;  Surgeon: Jules Husbands, MD;  Location: [facility] ORS;  Service: General;  Laterality: N/A;    Medical History: Past Medical History:  Diagnosis Date   Anemia    Aneurysm ([place])    Arthritis    RHEUMATOID ARTHRITIS   Asthma    Collagen vascular disease ([place])    COPD (chronic obstructive pulmonary disease) ([place])    Coronary artery disease    Diabetes mellitus without complication (HCC)    Edema    FEET/LEGS   GERD (gastroesophageal reflux disease)    Gout    H/O wheezing    History of hiatal hernia    Hypertension    Hypothyroidism    Neuropathy    Oxygen deficiency    HS   Peripheral vascular disease (HCC)    Sarcoidosis of lung (HCC)    Seizures (HCC)    WITH BRAIN 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    Sleep apnea    OXYGEN AT NIGHT 3 L [place]    Family History: Family History  Problem  Relation Age of Onset   Heart disease Mother    Hypertension Mother    Diabetes Mother    Diabetes Father    Heart disease Father    Hypertension Father    Breast cancer Maternal Aunt     Social History   Socioeconomic History   Marital status: Single    Spouse name: Not on file   Number of children: Not on file   Years of education: Not on file   Highest education level: Not on file  Occupational History   Not on file  Tobacco Use   Smoking status: Former    Packs/day: 1.00    Years: 30.00    Pack years: 30.00    Types: Cigarettes    Quit date: 01/27/2006    Years since quitting: 15.6   Smokeless tobacco: Never   Tobacco comments:    quit   Substance and Sexual Activity   Alcohol use: No    Alcohol/week: 0.0 standard drinks   Drug use: No   Sexual activity: Not on file  Other Topics Concern   Not on file  Social History Narrative   Not on file   Social Determinants of Health   Financial Resource Strain: Not on file  Food Insecurity: Not on file  Transportation Needs: Not on file  Physical Activity: Not on file  Stress: Not on file  Social Connections: Not on file  Intimate Partner Violence: Not on file      Review of Systems  Constitutional:  Negative for activity change, appetite change, chills, fatigue, fever and unexpected weight change.  HENT: Negative.  Negative for congestion, ear pain, rhinorrhea, sore throat and trouble swallowing.   Eyes: Negative.   Respiratory: Negative.  Negative for cough, chest tightness, shortness of breath and wheezing.   Cardiovascular: Negative.  Negative for chest pain.  Gastrointestinal: Negative.  Negative for abdominal pain, blood in stool, constipation, diarrhea, nausea and vomiting.  Endocrine: Negative.   Genitourinary: Negative.  Negative for difficulty urinating, dysuria, frequency, hematuria and urgency.  Musculoskeletal: Negative.  Negative for arthralgias, back pain, joint swelling, myalgias and neck pain.   Skin: Negative.  Negative for rash and wound.  Allergic/Immunologic: Negative.  Negative for immunocompromised state.  Neurological: Negative.  Negative for dizziness, seizures, numbness and headaches.  Hematological: Negative.   Psychiatric/Behavioral: Negative.  Negative for behavioral problems, self-injury and suicidal ideas. The patient is not nervous/anxious.    Vital Signs: BP 130/65 Comment: 158/78  Pulse 64   Temp 98.4 F (36.9 C)   Resp 16   Ht 5' 3" (1.6 m)   Wt 210 lb 12.8 oz (95.6 kg)   SpO2 97%   BMI 37.34 kg/m    Physical Exam Vitals reviewed.  Constitutional:      General: She is awake. She is not in acute distress.    Appearance: Normal appearance. She is well-developed and well-groomed. She is obese. She is not ill-appearing or diaphoretic.  HENT:     Head: Normocephalic and atraumatic.     Right Ear: Tympanic membrane, ear canal and external ear normal.     Left Ear: Tympanic membrane, ear canal and external ear normal.     Nose: Nose normal. No congestion or rhinorrhea.     Mouth/Throat:     Lips: Pink.     Mouth: Mucous membranes are moist.     Pharynx: Oropharynx is clear. Uvula midline. No oropharyngeal exudate or posterior oropharyngeal erythema.  Eyes:     General: Lids are normal. Vision grossly intact. Gaze aligned appropriately. No scleral icterus.       Right eye: No discharge.        Left eye: No discharge.     Extraocular Movements: Extraocular movements intact.     Conjunctiva/sclera: Conjunctivae normal.     Pupils: Pupils are equal, round, and reactive to light.     Funduscopic exam:    Right eye: Red reflex present.        Left eye: Red reflex present. Neck:     Thyroid: No thyromegaly.     Vascular: No JVD.     Trachea: Trachea and phonation normal. No tracheal deviation.  Cardiovascular:     Rate and Rhythm: Normal rate and regular rhythm.     Pulses:          Carotid pulses are 3+ on the right side and 3+ on the left side.       Radial pulses are 2+ on the right side and 2+ on the left side.       Posterior tibial pulses are 2+ on the right side and 2+ on the left side.     Heart sounds: Normal heart sounds, S1 normal and S2 normal. No murmur heard.   No friction rub. No gallop.  Pulmonary:     Effort: Pulmonary effort is normal. No accessory muscle usage or respiratory distress.     Breath sounds: Normal breath sounds and air entry. No stridor. No wheezing or rales.  Chest:     Chest wall: No tenderness.     Comments: Declined clinical breast exam and mammogram. Abdominal:     General: Bowel sounds are normal. There is no distension.     Palpations: Abdomen is soft. There is no shifting dullness, fluid wave, mass or pulsatile mass.     Tenderness: There is no abdominal  tenderness. There is no guarding or rebound.  Musculoskeletal:        General: No tenderness or deformity. Normal range of motion.     Cervical back: Normal range of motion and neck supple.     Right lower leg: Edema present.     Left lower leg: Edema present.  Lymphadenopathy:     Cervical: No cervical adenopathy.  Skin:    General: Skin is warm and dry.     Capillary Refill: Capillary refill takes less than 2 seconds.     Coloration: Skin is not pale.     Findings: No erythema or rash.  Neurological:     Mental Status: She is alert and oriented to person, place, and time.     Cranial Nerves: No cranial nerve deficit.     Motor: No abnormal muscle tone.     Coordination: Coordination normal.     Gait: Gait normal.     Deep Tendon Reflexes: Reflexes are normal and symmetric.  Psychiatric:        Mood and Affect: Mood and affect normal.        Behavior: Behavior normal. Behavior is cooperative.        Thought Content: Thought content normal.        Judgment: Judgment normal.       Assessment/Plan: 1. Encounter for routine adult health examination with abnormal findings Age-appropriate preventive screenings and vaccinations  discussed, annual physical exam completed. Routine labs for health maintenance ordered previously but still have not been drawn, reminded patient again today. PHM updated.   2. Essential hypertension Stable, continue medications as prescribed.   3. 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   4. Sarcoidosis of lung ([place]) Managed by rheumatology  5. Mixed hyperlipidemia Taking atorvastatin 10 mg daily  7. Dysuria Routine urinalysis done - UA/M w/rflx Culture, Routine - Microscopic Examination  8. Severe obesity (BMI 35.0-39.9) with comorbidity (HCC)Obesity Counseling: Risk Assessment: An assessment of behavioral risk factors was made today and includes lack of exercise sedentary lifestyle, lack of portion control and poor dietary habits.  Risk Modification Advice: She was counseled on portion control guidelines. Restricting daily caloric intake to 2000. The detrimental long term effects of obesity on her health and ongoing poor compliance was also discussed with the patient.    9. Needs flu shot Administered in office today - Flu Vaccine MDCK QUAD PF  10. Need for shingles vaccine - Zoster Vaccine Adjuvanted [facility]) injection; Inject 0.5 mLs into the muscle once for 1 dose.  Dispense: 0.5 mL; Refill: 0      General Counseling: Zohar verbalizes understanding of the findings of todays visit and agrees with plan of treatment. I have discussed any further diagnostic evaluation that may be needed or ordered today. We also reviewed her medications today. she has been encouraged to call the office with any questions or concerns that should arise related to todays visit.    Orders Placed This Encounter  Procedures   Microscopic Examination   Flu Vaccine MDCK QUAD PF   UA/M w/rflx Culture,  Routine    Meds ordered this encounter  Medications   Zoster Vaccine Adjuvanted [facility]) injection    Sig: Inject 0.5 mLs into the muscle once for 1 dose.    Dispense:  0.5 mL    Refill:  0    Return in about 6 months (around 02/25/2022) for F/U, med refill,  PCP.   Total time spent:30 Minutes Time spent includes review of chart, medications, test results, and follow up plan with the patient.   [place] Controlled Substance Database was reviewed by me.  This patient was seen by [name], FNP-C in collaboration with Dr. Clayborn Bigness as a part of collaborative care agreement.   R. Valetta Fuller, MSN, FNP-C Internal medicine

## 2021-08-31 DIAGNOSIS — J449 Chronic obstructive pulmonary disease, unspecified: Secondary | ICD-10-CM | POA: Diagnosis not present

## 2021-09-07 DIAGNOSIS — Z794 Long term (current) use of insulin: Secondary | ICD-10-CM | POA: Diagnosis not present

## 2021-09-07 DIAGNOSIS — E118 Type 2 diabetes mellitus with unspecified complications: Secondary | ICD-10-CM | POA: Diagnosis not present

## 2021-09-13 MED ORDER — HYDROXYCHLOROQUINE SULFATE 200 MG PO TABS: 200.0000 mg | ORAL_TABLET | Freq: Two times a day (BID) | ORAL | 2 refills | Status: DC

## 2021-09-13 MED ORDER — AMLODIPINE BESYLATE 10 MG PO TABS: 10.0000 mg | ORAL_TABLET | Freq: Every day | ORAL | 1 refills | Status: DC

## 2021-09-16 ENCOUNTER — Inpatient Hospital Stay
Admission: EM | Admit: 2021-09-16 | Discharge: 2021-09-26 | DRG: 637 | Disposition: A | Discharge status: Home Health | Payer: Medicare Other | Attending: Internal Medicine | Admitting: Internal Medicine

## 2021-09-16 ENCOUNTER — Emergency Department: Payer: Medicare Other

## 2021-09-16 ENCOUNTER — Other Ambulatory Visit: Payer: Self-pay

## 2021-09-16 DIAGNOSIS — E1165 Type 2 diabetes mellitus with hyperglycemia: Secondary | ICD-10-CM | POA: Diagnosis not present

## 2021-09-16 DIAGNOSIS — N179 Acute kidney failure, unspecified: Secondary | ICD-10-CM | POA: Diagnosis present

## 2021-09-16 DIAGNOSIS — I10 Essential (primary) hypertension: Secondary | ICD-10-CM | POA: Diagnosis not present

## 2021-09-16 DIAGNOSIS — Z794 Long term (current) use of insulin: Secondary | ICD-10-CM

## 2021-09-16 DIAGNOSIS — G9341 Metabolic encephalopathy: Secondary | ICD-10-CM | POA: Diagnosis not present

## 2021-09-16 DIAGNOSIS — I517 Cardiomegaly: Secondary | ICD-10-CM | POA: Diagnosis not present

## 2021-09-16 DIAGNOSIS — R29810 Facial weakness: Secondary | ICD-10-CM | POA: Diagnosis not present

## 2021-09-16 DIAGNOSIS — R4182 Altered mental status, unspecified: Secondary | ICD-10-CM | POA: Diagnosis not present

## 2021-09-16 DIAGNOSIS — E8729 Other acidosis: Secondary | ICD-10-CM | POA: Diagnosis present

## 2021-09-16 DIAGNOSIS — J9611 Chronic respiratory failure with hypoxia: Secondary | ICD-10-CM | POA: Diagnosis not present

## 2021-09-16 DIAGNOSIS — E034 Atrophy of thyroid (acquired): Secondary | ICD-10-CM | POA: Diagnosis present

## 2021-09-16 DIAGNOSIS — M2578 Osteophyte, vertebrae: Secondary | ICD-10-CM | POA: Diagnosis not present

## 2021-09-16 DIAGNOSIS — E1151 Type 2 diabetes mellitus with diabetic peripheral angiopathy without gangrene: Secondary | ICD-10-CM | POA: Diagnosis present

## 2021-09-16 DIAGNOSIS — I2510 Atherosclerotic heart disease of native coronary artery without angina pectoris: Secondary | ICD-10-CM | POA: Diagnosis not present

## 2021-09-16 DIAGNOSIS — D860 Sarcoidosis of lung: Secondary | ICD-10-CM | POA: Diagnosis not present

## 2021-09-16 DIAGNOSIS — I639 Cerebral infarction, unspecified: Secondary | ICD-10-CM | POA: Diagnosis not present

## 2021-09-16 DIAGNOSIS — M4602 Spinal enthesopathy, cervical region: Secondary | ICD-10-CM | POA: Diagnosis not present

## 2021-09-16 DIAGNOSIS — F05 Delirium due to known physiological condition: Secondary | ICD-10-CM | POA: Diagnosis present

## 2021-09-16 DIAGNOSIS — M1A9XX0 Chronic gout, unspecified, without tophus (tophi): Secondary | ICD-10-CM | POA: Diagnosis present

## 2021-09-16 DIAGNOSIS — K5900 Constipation, unspecified: Secondary | ICD-10-CM | POA: Diagnosis present

## 2021-09-16 DIAGNOSIS — E860 Dehydration: Secondary | ICD-10-CM | POA: Diagnosis present

## 2021-09-16 DIAGNOSIS — J449 Chronic obstructive pulmonary disease, unspecified: Secondary | ICD-10-CM | POA: Diagnosis present

## 2021-09-16 DIAGNOSIS — K760 Fatty (change of) liver, not elsewhere classified: Secondary | ICD-10-CM | POA: Diagnosis not present

## 2021-09-16 DIAGNOSIS — N1832 Chronic kidney disease, stage 3b: Secondary | ICD-10-CM | POA: Diagnosis not present

## 2021-09-16 DIAGNOSIS — Z79890 Hormone replacement therapy (postmenopausal): Secondary | ICD-10-CM

## 2021-09-16 DIAGNOSIS — Z951 Presence of aortocoronary bypass graft: Secondary | ICD-10-CM

## 2021-09-16 DIAGNOSIS — G40909 Epilepsy, unspecified, not intractable, without status epilepticus: Secondary | ICD-10-CM | POA: Diagnosis present

## 2021-09-16 DIAGNOSIS — E1100 Type 2 diabetes mellitus with hyperosmolarity without nonketotic hyperglycemic-hyperosmolar coma (NKHHC): Secondary | ICD-10-CM | POA: Diagnosis not present

## 2021-09-16 DIAGNOSIS — N2581 Secondary hyperparathyroidism of renal origin: Secondary | ICD-10-CM | POA: Diagnosis present

## 2021-09-16 DIAGNOSIS — Z880 Allergy status to penicillin: Secondary | ICD-10-CM

## 2021-09-16 DIAGNOSIS — Z79899 Other long term (current) drug therapy: Secondary | ICD-10-CM

## 2021-09-16 DIAGNOSIS — K219 Gastro-esophageal reflux disease without esophagitis: Secondary | ICD-10-CM | POA: Diagnosis present

## 2021-09-16 DIAGNOSIS — R739 Hyperglycemia, unspecified: Secondary | ICD-10-CM | POA: Diagnosis not present

## 2021-09-16 DIAGNOSIS — F03918 Unspecified dementia, unspecified severity, with other behavioral disturbance: Secondary | ICD-10-CM | POA: Diagnosis not present

## 2021-09-16 DIAGNOSIS — E1122 Type 2 diabetes mellitus with diabetic chronic kidney disease: Secondary | ICD-10-CM | POA: Diagnosis not present

## 2021-09-16 DIAGNOSIS — D631 Anemia in chronic kidney disease: Secondary | ICD-10-CM | POA: Diagnosis present

## 2021-09-16 DIAGNOSIS — Z743 Need for continuous supervision: Secondary | ICD-10-CM | POA: Diagnosis not present

## 2021-09-16 DIAGNOSIS — Z20822 Contact with and (suspected) exposure to covid-19: Secondary | ICD-10-CM | POA: Diagnosis present

## 2021-09-16 DIAGNOSIS — R778 Other specified abnormalities of plasma proteins: Secondary | ICD-10-CM | POA: Diagnosis present

## 2021-09-16 DIAGNOSIS — E785 Hyperlipidemia, unspecified: Secondary | ICD-10-CM | POA: Diagnosis present

## 2021-09-16 DIAGNOSIS — Z881 Allergy status to other antibiotic agents status: Secondary | ICD-10-CM

## 2021-09-16 DIAGNOSIS — Z9114 Patient's other noncompliance with medication regimen: Secondary | ICD-10-CM

## 2021-09-16 DIAGNOSIS — E0801 Diabetes mellitus due to underlying condition with hyperosmolarity with coma: Secondary | ICD-10-CM | POA: Diagnosis not present

## 2021-09-16 DIAGNOSIS — Z87891 Personal history of nicotine dependence: Secondary | ICD-10-CM

## 2021-09-16 DIAGNOSIS — E1142 Type 2 diabetes mellitus with diabetic polyneuropathy: Secondary | ICD-10-CM | POA: Diagnosis present

## 2021-09-16 DIAGNOSIS — G4733 Obstructive sleep apnea (adult) (pediatric): Secondary | ICD-10-CM | POA: Diagnosis present

## 2021-09-16 DIAGNOSIS — R6889 Other general symptoms and signs: Secondary | ICD-10-CM | POA: Diagnosis not present

## 2021-09-16 DIAGNOSIS — G4700 Insomnia, unspecified: Secondary | ICD-10-CM | POA: Diagnosis present

## 2021-09-16 DIAGNOSIS — M069 Rheumatoid arthritis, unspecified: Secondary | ICD-10-CM | POA: Diagnosis not present

## 2021-09-16 DIAGNOSIS — I129 Hypertensive chronic kidney disease with stage 1 through stage 4 chronic kidney disease, or unspecified chronic kidney disease: Secondary | ICD-10-CM | POA: Diagnosis not present

## 2021-09-16 DIAGNOSIS — E119 Type 2 diabetes mellitus without complications: Secondary | ICD-10-CM

## 2021-09-16 DIAGNOSIS — Z885 Allergy status to narcotic agent status: Secondary | ICD-10-CM

## 2021-09-16 DIAGNOSIS — G8929 Other chronic pain: Secondary | ICD-10-CM | POA: Diagnosis present

## 2021-09-16 DIAGNOSIS — Z8673 Personal history of transient ischemic attack (TIA), and cerebral infarction without residual deficits: Secondary | ICD-10-CM

## 2021-09-16 DIAGNOSIS — Z9981 Dependence on supplemental oxygen: Secondary | ICD-10-CM

## 2021-09-16 DIAGNOSIS — R4781 Slurred speech: Secondary | ICD-10-CM | POA: Diagnosis not present

## 2021-09-16 DIAGNOSIS — Z8249 Family history of ischemic heart disease and other diseases of the circulatory system: Secondary | ICD-10-CM

## 2021-09-16 DIAGNOSIS — R410 Disorientation, unspecified: Secondary | ICD-10-CM | POA: Diagnosis not present

## 2021-09-16 DIAGNOSIS — Z7982 Long term (current) use of aspirin: Secondary | ICD-10-CM

## 2021-09-16 DIAGNOSIS — Z7985 Long-term (current) use of injectable non-insulin antidiabetic drugs: Secondary | ICD-10-CM

## 2021-09-16 DIAGNOSIS — Z833 Family history of diabetes mellitus: Secondary | ICD-10-CM

## 2021-09-16 LAB — ETHANOL: Alcohol, Ethyl (B): <10 mg/dL (ref <10)

## 2021-09-16 LAB — URINALYSIS, COMPLETE (UACMP) WITH MICROSCOPIC
Bacteria, UA: NONE SEEN
Bilirubin Urine: NEGATIVE
Glucose, UA: >=500 mg/dL — AB
Hgb urine dipstick: NEGATIVE
Ketones, ur: NEGATIVE mg/dL
Leukocytes,Ua: NEGATIVE
Nitrite: NEGATIVE
Protein, ur: NEGATIVE mg/dL
Specific Gravity, Urine: 1.023 (ref 1.005–1.030)
pH: 6 (ref 5.0–8.0)

## 2021-09-16 LAB — BLOOD GAS, VENOUS
Acid-base deficit: 6 mmol/L — ABNORMAL HIGH (ref 0.0–2.0)
Bicarbonate: 22.4 mmol/L (ref 20.0–28.0)
O2 Saturation: 40.2 %
Patient temperature: 37
pCO2, Ven: 56 mmHg (ref 44.0–60.0)
pH, Ven: 7.21 — ABNORMAL LOW (ref 7.250–7.430)
pO2, Ven: <31 mmHg — CL (ref 32.0–45.0)

## 2021-09-16 LAB — CBC
HCT: 26 % — ABNORMAL LOW (ref 36.0–46.0)
Hemoglobin: 8.2 g/dL — ABNORMAL LOW (ref 12.0–15.0)
MCH: 23.2 pg — ABNORMAL LOW (ref 26.0–34.0)
MCHC: 31.5 g/dL (ref 30.0–36.0)
MCV: 73.7 fL — ABNORMAL LOW (ref 80.0–100.0)
Platelets: 117 10*3/uL — ABNORMAL LOW (ref 150–400)
RBC: 3.53 MIL/uL — ABNORMAL LOW (ref 3.87–5.11)
RDW: 16.7 % — ABNORMAL HIGH (ref 11.5–15.5)
WBC: 2.3 10*3/uL — ABNORMAL LOW (ref 4.0–10.5)
nRBC: 0 % (ref 0.0–0.2)

## 2021-09-16 LAB — BASIC METABOLIC PANEL
Anion gap: 13 (ref 5–15)
BUN: 18 mg/dL (ref 8–23)
CO2: 18 mmol/L — ABNORMAL LOW (ref 22–32)
Calcium: 9 mg/dL (ref 8.9–10.3)
Chloride: 104 mmol/L (ref 98–111)
Creatinine, Ser: 1.44 mg/dL — ABNORMAL HIGH (ref 0.44–1.00)
GFR, Estimated: 39 mL/min — ABNORMAL LOW (ref >60)
Glucose, Bld: 358 mg/dL — ABNORMAL HIGH (ref 70–99)
Potassium: 4.2 mmol/L (ref 3.5–5.1)
Sodium: 135 mmol/L (ref 135–145)

## 2021-09-16 LAB — COMPREHENSIVE METABOLIC PANEL
ALT: 15 U/L (ref 0–44)
AST: 21 U/L (ref 15–41)
Albumin: 3.5 g/dL (ref 3.5–5.0)
Alkaline Phosphatase: 170 U/L — ABNORMAL HIGH (ref 38–126)
Anion gap: 9 (ref 5–15)
BUN: 20 mg/dL (ref 8–23)
CO2: 22 mmol/L (ref 22–32)
Calcium: 8.7 mg/dL — ABNORMAL LOW (ref 8.9–10.3)
Chloride: 99 mmol/L (ref 98–111)
Creatinine, Ser: 1.22 mg/dL — ABNORMAL HIGH (ref 0.44–1.00)
GFR, Estimated: 47 mL/min — ABNORMAL LOW (ref >60)
Glucose, Bld: 647 mg/dL (ref 70–99)
Potassium: 4.8 mmol/L (ref 3.5–5.1)
Sodium: 130 mmol/L — ABNORMAL LOW (ref 135–145)
Total Bilirubin: 1.3 mg/dL — ABNORMAL HIGH (ref 0.3–1.2)
Total Protein: 6.9 g/dL (ref 6.5–8.1)

## 2021-09-16 LAB — DIFFERENTIAL
Abs Immature Granulocytes: 0 10*3/uL (ref 0.00–0.07)
Basophils Absolute: 0 10*3/uL (ref 0.0–0.1)
Basophils Relative: 1 %
Eosinophils Absolute: 0.1 10*3/uL (ref 0.0–0.5)
Eosinophils Relative: 4 %
Immature Granulocytes: 0 %
Lymphocytes Relative: 37 %
Lymphs Abs: 0.8 10*3/uL (ref 0.7–4.0)
Monocytes Absolute: 0.2 10*3/uL (ref 0.1–1.0)
Monocytes Relative: 8 %
Neutro Abs: 1.2 10*3/uL — ABNORMAL LOW (ref 1.7–7.7)
Neutrophils Relative %: 50 %

## 2021-09-16 LAB — URINE DRUG SCREEN, QUALITATIVE (ARMC ONLY)
Amphetamines, Ur Screen: NOT DETECTED
Barbiturates, Ur Screen: NOT DETECTED
Benzodiazepine, Ur Scrn: NOT DETECTED
Cannabinoid 50 Ng, Ur ~~LOC~~: NOT DETECTED
Cocaine Metabolite,Ur ~~LOC~~: NOT DETECTED
MDMA (Ecstasy)Ur Screen: NOT DETECTED
Methadone Scn, Ur: NOT DETECTED
Opiate, Ur Screen: NOT DETECTED
Phencyclidine (PCP) Ur S: NOT DETECTED
Tricyclic, Ur Screen: NOT DETECTED

## 2021-09-16 LAB — TYPE AND SCREEN
ABO/RH(D): O POS
Antibody Screen: NEGATIVE

## 2021-09-16 LAB — APTT: aPTT: 28 seconds (ref 24–36)

## 2021-09-16 LAB — PROTIME-INR
INR: 1.1 (ref 0.8–1.2)
Prothrombin Time: 14.2 seconds (ref 11.4–15.2)

## 2021-09-16 LAB — TROPONIN I (HIGH SENSITIVITY): Troponin I (High Sensitivity): 56 ng/L — ABNORMAL HIGH (ref <18)

## 2021-09-16 LAB — CBG MONITORING, ED
Glucose-Capillary: >600 mg/dL (ref 70–99)
Glucose-Capillary: 365 mg/dL — ABNORMAL HIGH (ref 70–99)
Glucose-Capillary: 327 mg/dL — ABNORMAL HIGH (ref 70–99)
Glucose-Capillary: 284 mg/dL — ABNORMAL HIGH (ref 70–99)
Glucose-Capillary: 162 mg/dL — ABNORMAL HIGH (ref 70–99)

## 2021-09-16 LAB — TSH: TSH: 3.48 u[IU]/mL (ref 0.350–4.500)

## 2021-09-16 LAB — RESP PANEL BY RT-PCR (FLU A&B, COVID) ARPGX2
Influenza A by PCR: NEGATIVE
Influenza B by PCR: NEGATIVE
SARS Coronavirus 2 by RT PCR: NEGATIVE

## 2021-09-16 LAB — PROCALCITONIN: Procalcitonin: <0.1 ng/mL

## 2021-09-16 LAB — BETA-HYDROXYBUTYRIC ACID: Beta-Hydroxybutyric Acid: 0.28 mmol/L — ABNORMAL HIGH (ref 0.05–0.27)

## 2021-09-16 LAB — AMMONIA: Ammonia: 33 umol/L (ref 9–35)

## 2021-09-16 LAB — OSMOLALITY: Osmolality: 301 mOsm/kg — ABNORMAL HIGH (ref 275–295)

## 2021-09-16 LAB — LACTIC ACID, PLASMA: Lactic Acid, Venous: 6.4 mmol/L (ref 0.5–1.9)

## 2021-09-16 MED ORDER — MIDAZOLAM HCL 2 MG/2ML IJ SOLN: 2.0000 mg | Freq: Once | INTRAMUSCULAR | Status: AC

## 2021-09-16 MED ORDER — POTASSIUM CHLORIDE 10 MEQ/100ML IV SOLN: 10.0000 meq | INTRAVENOUS | Status: AC

## 2021-09-16 MED ORDER — LACTATED RINGERS IV BOLUS
1000.0000 mL | Freq: Once | INTRAVENOUS | Status: AC
  Administered 2021-09-16: 1000 mL via INTRAVENOUS

## 2021-09-16 MED ORDER — SODIUM CHLORIDE 0.9 % IV BOLUS
1000.0000 mL | Freq: Once | INTRAVENOUS | Status: AC
  Administered 2021-09-16: 1000 mL via INTRAVENOUS

## 2021-09-16 MED ORDER — DEXTROSE 50 % IV SOLN: 0.0000 mL | INTRAVENOUS | Status: DC | PRN

## 2021-09-16 MED ORDER — INSULIN ASPART 100 UNIT/ML IJ SOLN
10.0000 [IU] | Freq: Once | INTRAMUSCULAR | Status: AC
  Administered 2021-09-16: 10 [IU] via INTRAVENOUS
  Filled 2021-09-16: qty 1

## 2021-09-16 MED ORDER — THIAMINE HCL 100 MG/ML IJ SOLN
500.0000 mg | Freq: Once | INTRAVENOUS | Status: AC
  Administered 2021-09-16: 500 mg via INTRAVENOUS
  Filled 2021-09-16: qty 5

## 2021-09-16 MED ORDER — INSULIN ASPART 100 UNIT/ML IJ SOLN
0.0000 [IU] | INTRAMUSCULAR | Status: DC
Start: 2021-09-16 — End: 2021-09-18
  Administered 2021-09-16: 8 [IU] via SUBCUTANEOUS
  Administered 2021-09-16: 11 [IU] via SUBCUTANEOUS
  Administered 2021-09-16: 3 [IU] via SUBCUTANEOUS
  Administered 2021-09-17: 11 [IU] via SUBCUTANEOUS
  Administered 2021-09-17: 3 [IU] via SUBCUTANEOUS
  Administered 2021-09-17: 5 [IU] via SUBCUTANEOUS
  Administered 2021-09-17: 8 [IU] via SUBCUTANEOUS
  Administered 2021-09-18: 11 [IU] via SUBCUTANEOUS
  Administered 2021-09-18: 8 [IU] via SUBCUTANEOUS
  Filled 2021-09-16 (×8): qty 1

## 2021-09-16 MED ORDER — LACTATED RINGERS IV SOLN: INTRAVENOUS | Status: DC

## 2021-09-16 MED ORDER — MIDAZOLAM HCL 2 MG/2ML IJ SOLN
INTRAMUSCULAR | Status: AC
  Administered 2021-09-16: 2 mg via INTRAMUSCULAR
  Filled 2021-09-16: qty 2

## 2021-09-16 MED ORDER — ZIPRASIDONE MESYLATE 20 MG IM SOLR
20.0000 mg | Freq: Once | INTRAMUSCULAR | Status: AC
  Administered 2021-09-16: 20 mg via INTRAMUSCULAR

## 2021-09-16 MED ORDER — INSULIN REGULAR(HUMAN) IN NACL 100-0.9 UT/100ML-% IV SOLN
INTRAVENOUS | Status: DC
  Filled 2021-09-16: qty 100

## 2021-09-16 MED ORDER — MIDAZOLAM HCL 2 MG/2ML IJ SOLN: 1.0000 mg | Freq: Once | INTRAMUSCULAR | Status: DC

## 2021-09-16 MED ORDER — DEXTROSE IN LACTATED RINGERS 5 % IV SOLN: INTRAVENOUS | Status: DC

## 2021-09-16 NOTE — H&P (Signed)
History and Physical    Chelsey [name] YKD:[id number] DOB: 01-22-1951 DOA: 09/16/2021  PCP: Lavera Guise, MD    Patient coming from:  Home   Chief Complaint:  Altered mental status   HPI: Chelsey [name] is a 70 y.o. female seen in ed with complaints of altered mental status patient is initially oriented only to herself, EMS was called for slurred speech and facial droop but was not present and not noted by EMS, initial blood pressure was 164/92 patient was given normal saline half liter by EMS.  In the emergency room patient was agitated combative and was unsafe and was IVC'd.  Per family this behavior is very different and at baseline patient is alert awake oriented ambulatory and independent. On my exam pt is very somnolent, and confused and cooperative but restless. Hpi is therefore per chart review and edmd / nurses note.     Pt has past medical history of COPD, CAD, diabetes mellitus type 2, gout, hypertension, hypothyroidism, PVD, pulmonary sarcoidosis, seizures, obstructive sleep apnea on oxygen, anemia, brain aneurysm, RA, history of tobacco abuse.  ED Course:  Vitals:   09/16/21 1715 09/16/21 1730 09/16/21 1846 09/16/21 1945  BP: (!) 152/66 (!) 152/68 (!) 122/105 (!) 124/104  Pulse: 78 76 66 60  Resp:   19 18  Temp:      TempSrc:      SpO2: 98% 96% 98% 96%  Weight:      Height:          Review of Systems:  Review of Systems  Unable to perform ROS: Mental status change    Past Medical History:  Diagnosis Date   Anemia    Aneurysm (HCC)    Arthritis    RHEUMATOID ARTHRITIS   Asthma    Collagen vascular disease (HCC)    COPD (chronic obstructive pulmonary disease) (HCC)    Coronary artery disease    Diabetes mellitus without complication (HCC)    Edema    FEET/LEGS   GERD (gastroesophageal reflux disease)    Gout    H/O wheezing    History of hiatal hernia    Hypertension    Hypothyroidism    Neuropathy    Oxygen deficiency    HS   Peripheral  vascular disease (HCC)    Sarcoidosis of lung (HCC)    Seizures (HCC)    WITH BRAIN 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    Sleep apnea    OXYGEN AT NIGHT 3 L [place]    Past Surgical History:  Procedure Laterality Date   BRAIN SURGERY  1990's   CATARACT EXTRACTION W/PHACO Left 06/11/2016   Procedure: CATARACT EXTRACTION PHACO AND INTRAOCULAR LENS PLACEMENT (IOC);  Surgeon: Birder Robson, MD;  Location: [facility] ORS;  Service: Ophthalmology;  Laterality: Left;  [place] 1.01 AP% 21.6 CDE 13.23 Fluid pack lot # [id number]   CEREBRAL ANEURYSM REPAIR  1990's   COILS   CORONARY ARTERY BYPASS GRAFT     EYE SURGERY  2000's   bilateral cataract   FRACTURE SURGERY     HERNIA REPAIR  2000   ventral   STENT PLACEMENT VASCULAR ([place] HX)     VEIN BYPASS SURGERY     VENTRAL HERNIA REPAIR N/A 04/29/2016   Procedure: Laparoscopic HERNIA REPAIR VENTRAL ADULT;  Surgeon: Jules Husbands, MD;  Location: [facility] ORS;  Service: General;  Laterality: N/A;     reports that she quit smoking about 15 years ago. Her smoking use included cigarettes. She has a 30.00  pack-year smoking history. She has never used smokeless tobacco. She reports that she does not drink alcohol and does not use drugs.  Allergies  Allergen Reactions   Oxycodone-Acetaminophen Other (See Comments)    Hallucinations Hallucinations HALLUCINATIONS Other reaction(s): Other (See Comments) HALLUCINATIONS Other reaction(s): Other (See Comments) Hallucinations Hallucinations HALLUCINATIONS Hallucinations Other reaction(s): Other (See Comments), Other (See Comments) HALLUCINATIONS Hallucinations Hallucinations HALLUCINATIONS Other reaction(s): Other (See Comments) HALLUCINATIONS Other reaction(s): Other (See Comments) Hallucinations Hallucinations HALLUCINATIONS Hallucinations Other reaction(s): Other (See Comments) HALLUCINATIONS Other reaction(s): Other (See Comments) Hallucinations Hallucinations HALLUCINATIONS Hallucinations     Indomethacin Other (See Comments)    BURN HOLE IN STOMACH BURN HOLE IN STOMACH Other reaction(s): Other (See Comments), Other (See Comments) BURN HOLE IN STOMACH BURN HOLE IN STOMACH BURN HOLE IN STOMACH BURN HOLE IN STOMACH BURN HOLE IN STOMACH    Penicillins Rash    Family History  Problem Relation Age of Onset   Heart disease Mother    Hypertension Mother    Diabetes Mother    Diabetes Father    Heart disease Father    Hypertension Father    Breast cancer Maternal Aunt     Prior to Admission medications   Medication Sig Start Date End Date Taking? Authorizing Provider  albuterol (VENTOLIN HFA) 108 (90 Base) MCG/ACT inhaler Inhale 2 puffs into the lungs every 6 (six) hours as needed for wheezing or shortness of breath.    [provider]  allopurinol (ZYLOPRIM) 300 MG tablet TAKE 1 TABLET BY MOUTH DAILY 08/07/21   [name], NP  amLODipine (NORVASC) 10 MG tablet Take 1 tablet (10 mg total) by mouth daily. 09/13/21   [name], NP  aspirin 81 MG EC tablet Take by mouth. 01/08/12   [provider]  aspirin EC 81 MG tablet Take 81 mg by mouth daily.    [provider]  atorvastatin (LIPITOR) 10 MG tablet TAKE 1 TABLET BY MOUTH DAILY FOR CHOLESTEROL 06/09/21   Lavera Guise, MD  Blood Glucose Monitoring Suppl (ACCU-CHEK AVIVA PLUS) W/DEVICE KIT Use as directed. 11/24/13   [provider]  celecoxib (CELEBREX) 100 MG capsule Take by mouth. 11/11/20 11/11/21  [provider]  cetirizine (ZYRTEC) 10 MG tablet Take 1 tablet (10 mg total) by mouth daily. 01/16/21   Luiz Ochoa, NP  cetirizine (ZYRTEC) 10 MG tablet Take 1 tablet by mouth daily. 01/16/21   [provider]  Cholecalciferol 25 MCG (1000 UT) capsule Take by mouth. 04/21/21 04/21/22  [provider]  cilostazol (PLETAL) 50 MG tablet TAKE 1 TABLET BY MOUTH TWICE A DAY 06/24/21   Lavera Guise, MD  colchicine 0.6 MG tablet TAKE 1 TO 2 TABLETS BY MOUTH DAILY  AS NEEDED FOR GOUT FLARE **ONLY TAKE WITH GOUT FLARE** 04/16/21   Lavera Guise, MD  conjugated estrogens (PREMARIN) vaginal cream Place 1 Applicatorful vaginally 2 (two) times a week. 08/01/20   Lavera Guise, MD  diclofenac Sodium (VOLTAREN) 1 % GEL Apply topically. 06/30/16   [provider]  Dulaglutide 1.5 MG/0.5ML SOPN Inject into the skin. 01/11/19   [provider]  ezetimibe (ZETIA) 10 MG tablet Take by mouth. 10/13/19   [provider]  FARXIGA 10 MG TABS tablet TAKE 1 TABLET BY MOUTH DAILY 03/24/21   Lavera Guise, MD  ferrous sulfate 325 (65 FE) MG tablet TAKE 1 TABLET BY MOUTH EVERY DAY WITH BREAKFAST 11/04/15   [provider]  fluticasone (FLONASE) 50 MCG/ACT nasal spray  Frequency:QD   Dosage:50   MCG  Instructions:  Note:2 sprays each nostril daily Dose: 50MCG 02/15/07   [provider]  Fluticasone-Salmeterol (ADVAIR) 250-50 MCG/DOSE AEPB Inhale 1 puff into the lungs 2 (two) times daily.    [provider]  gabapentin (NEURONTIN) 300 MG capsule TAKE 1 CAPSULE BY MOUTH 3 TIMES A DAY 08/07/21   [name], NP  glucose blood test strip Test sugar 4 x aday for uncontrolled dm on insulin E11.22 11/28/19   Lavera Guise, MD  hydrALAZINE (APRESOLINE) 10 MG tablet Take 1 tablet (10 mg total) by mouth 3 (three) times daily. 07/02/21   Lavera Guise, MD  hydrALAZINE (APRESOLINE) 10 MG tablet Take by mouth. 03/02/19   [provider]  hydroxychloroquine (PLAQUENIL) 200 MG tablet Take 1 tablet (200 mg total) by mouth 2 (two) times daily. 09/13/21   [name], NP  insulin degludec (TRESIBA FLEXTOUCH) 200 UNIT/ML FlexTouch Pen 30 Units with breakfast, with lunch, and with evening meal. 09/05/19   [provider]  insulin lispro (HUMALOG) 100 UNIT/ML KwikPen 30 units at breakfast 35 units at lunch 30 units at dinner 08/13/20   [provider]  insulin regular human CONCENTRATED (HUMULIN R U-500 KWIKPEN) 500 UNIT/ML  kwikpen Take insulin 60 units three times a day 02/15/20   Kendell Bane, NP  LANCETS ULTRA THIN MISC Apply 1 each topically daily.  07/20/13   [provider]  levothyroxine (SYNTHROID) 50 MCG tablet TAKE 1 TABLET BY MOUTH IN THE MORNING ONAN EMPTY STOMACH FOR UNDERACTIVE THYROID 04/17/21   Lavera Guise, MD  linaclotide [facility]) 145 MCG CAPS capsule Take 1 capsule (145 mcg total) by mouth daily. 01/16/20   Kendell Bane, NP  losartan (COZAAR) 50 MG tablet TAKE 1 TABLET BY MOUTH DAILY FOR HYPENTENSION 03/12/21   Lavera Guise, MD  omeprazole (PRILOSEC) 20 MG capsule TAKE ONE CAPSULE BY MOUTH TWICE A DAY FOR GERD 06/09/21   Lavera Guise, MD  OXYGEN Inhale 3 L into the lungs. Nighttime use    [provider]  pentoxifylline (TRENTAL) 400 MG CR tablet Take by mouth. 09/22/19   [provider]  potassium chloride SA (KLOR-CON) 20 MEQ tablet TAKE 1 TABLET BY MOUTH DAILY 04/30/21   Lavera Guise, MD  torsemide (DEMADEX) 10 MG tablet Take 10 mg by mouth daily.    [provider]  ULTICARE SHORT PEN NEEDLES 31G X 8 MM [place] USE 3 TIMES A DAY 01/28/21   Lavera Guise, MD    Physical Exam: Vitals:   09/16/21 1715 09/16/21 1730 09/16/21 1846 09/16/21 1945  BP: (!) 152/66 (!) 152/68 (!) 122/105 (!) 124/104  Pulse: 78 76 66 60  Resp:   19 18  Temp:      TempSrc:      SpO2: 98% 96% 98% 96%  Weight:      Height:       Physical Exam Vitals and nursing note reviewed.  Constitutional:      General: She is not in acute distress.    Appearance: She is not ill-appearing, toxic-appearing or diaphoretic.  HENT:     Head: Normocephalic and atraumatic.     Right Ear: External ear normal.     Left Ear: External ear normal.     Nose: Nose normal.     Mouth/Throat:     Mouth: Mucous membranes are moist.  Eyes:     Extraocular Movements: Extraocular movements intact.  Pupils: Pupils are equal, round, and reactive to light.  Neck:     Vascular: No carotid bruit.   Cardiovascular:     Rate and Rhythm: Normal rate and regular rhythm.     Pulses: Normal pulses.     Heart sounds: Normal heart sounds.  Pulmonary:     Effort: Pulmonary effort is normal.     Breath sounds: Normal breath sounds.  Abdominal:     General: Bowel sounds are normal.     Palpations: Abdomen is soft.  Musculoskeletal:     Right lower leg: No edema.     Left lower leg: No edema.  Skin:    General: Skin is warm.  Neurological:     General: No focal deficit present.     Mental Status: She is alert. She is disoriented.     Cranial Nerves: No cranial nerve deficit.     Motor: No weakness.     Labs on Admission: I have personally reviewed following labs and imaging studies  No results for input(s): CKTOTAL, CKMB, TROPONINI in the last 72 hours. Lab Results  Component Value Date   WBC 2.3 (L) 09/16/2021   HGB 8.2 (L) 09/16/2021   HCT 26.0 (L) 09/16/2021   MCV 73.7 (L) 09/16/2021   PLT 117 (L) 09/16/2021    Recent Labs  Lab 09/16/21 1507 09/16/21 1618  NA 130* 135  K 4.8 4.2  CL 99 104  CO2 22 18*  BUN 20 18  CREATININE 1.22* 1.44*  CALCIUM 8.7* 9.0  PROT 6.9  --   BILITOT 1.3*  --   ALKPHOS 170*  --   ALT 15  --   AST 21  --   GLUCOSE 647* 358*   Lab Results  Component Value Date   CHOL 135 11/20/2019   HDL 32 (L) 11/20/2019   LDLCALC 64 11/20/2019   TRIG 239 (H) 11/20/2019   No results found for: DDIMER Invalid input(s): POCBNP  Urinalysis    Component Value Date/Time   COLORURINE STRAW (A) 09/16/2021 1714   APPEARANCEUR CLEAR (A) 09/16/2021 1714   APPEARANCEUR Clear 08/20/2020 1248   LABSPEC 1.023 09/16/2021 1714   LABSPEC 1.017 07/03/2013 2032   PHURINE 6.0 09/16/2021 1714   GLUCOSEU >=500 (A) 09/16/2021 1714   GLUCOSEU Negative 07/03/2013 2032   HGBUR NEGATIVE 09/16/2021 1714   BILIRUBINUR NEGATIVE 09/16/2021 1714   BILIRUBINUR Negative 08/20/2020 1248   BILIRUBINUR Negative 07/03/2013 2032   KETONESUR NEGATIVE 09/16/2021 1714    PROTEINUR NEGATIVE 09/16/2021 1714   UROBILINOGEN 0.2 07/31/2020 1052   NITRITE NEGATIVE 09/16/2021 1714   LEUKOCYTESUR NEGATIVE 09/16/2021 1714   LEUKOCYTESUR Trace 07/03/2013 2032   COVID-19 Labs No results for input(s): DDIMER, FERRITIN, LDH, CRP in the last 72 hours.  Lab Results  Component Value Date   [place] NEGATIVE 09/16/2021    Radiological Exams on Admission: CT HEAD WO CONTRAST  Result Date: 09/16/2021 CLINICAL DATA:  Slurred speech. EXAM: CT HEAD WITHOUT CONTRAST CT CERVICAL SPINE WITHOUT CONTRAST TECHNIQUE: Multidetector CT imaging of the head and cervical spine was performed following the standard protocol without intravenous contrast. Multiplanar CT image reconstructions of the cervical spine were also generated. COMPARISON:  CT a head dated October 05, 2019. FINDINGS: CT HEAD FINDINGS Brain: No evidence of acute infarction, hemorrhage, hydrocephalus, extra-axial collection or mass lesion/mass effect. 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 Stable mild atrophy and advanced  chronic microvascular ischemic changes. Vascular: Calcified atherosclerosis at the skull base. No hyperdense vessel. Unchanged aneurysm coils along the planum sphenoidale on the right. Skull: Normal. Negative for fracture or focal lesion. Sinuses/Orbits: No acute finding. Other: None. CT CERVICAL SPINE FINDINGS Alignment: Straightening of the normal cervical lordosis. No listhesis. Skull base and vertebrae: No acute fracture. No primary bone lesion or focal pathologic process. Soft tissues and spinal canal: No prevertebral fluid or swelling. No visible canal hematoma. Disc levels: Moderate disc height loss and endplate spurring at G9-Q1 and C6-C7. Upper chest: Negative. Other: None. IMPRESSION: 1. No acute intracranial abnormality. 2. No acute cervical spine fracture or traumatic listhesis.  Electronically Signed   By: Titus Dubin M.D.   On: 09/16/2021 15:42   CT CERVICAL SPINE WO CONTRAST  Result Date: 09/16/2021 CLINICAL DATA:  Slurred speech. EXAM: CT HEAD WITHOUT CONTRAST CT CERVICAL SPINE WITHOUT CONTRAST TECHNIQUE: Multidetector CT imaging of the head and cervical spine was performed following the standard protocol without intravenous contrast. Multiplanar CT image reconstructions of the cervical spine were also generated. COMPARISON:  CT a head dated October 05, 2019. FINDINGS: CT HEAD FINDINGS Brain: No evidence of acute infarction, hemorrhage, hydrocephalus, extra-axial collection or mass lesion/mass effect. 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 Stable mild atrophy and advanced chronic microvascular ischemic changes. Vascular: Calcified atherosclerosis at the skull base. No hyperdense vessel. Unchanged aneurysm coils along the planum sphenoidale on the right. Skull: Normal. Negative for fracture or focal lesion. Sinuses/Orbits: No acute finding. Other: None. CT CERVICAL SPINE FINDINGS Alignment: Straightening of the normal cervical lordosis. No listhesis. Skull base and vertebrae: No acute fracture. No primary bone lesion or focal pathologic process. Soft tissues and spinal canal: No prevertebral fluid or swelling. No visible canal hematoma. Disc levels: Moderate disc height loss and endplate spurring at J9-E1 and C6-C7. Upper chest: Negative. Other: None. IMPRESSION: 1. No acute intracranial abnormality. 2. No acute cervical spine fracture or traumatic listhesis. Electronically Signed   By: Titus Dubin M.D.   On: 09/16/2021 15:42   DG Chest Portable 1 View  Result Date: 09/16/2021 CLINICAL DATA:  Altered mental status EXAM: PORTABLE CHEST 1 VIEW COMPARISON:  Compared with report for previous examination done on 10/23/2015. Due to technical difficulties, images  of the previous studies are not available for comparison FINDINGS: Transverse diameter of heart is increased. There are no signs of alveolar pulmonary edema or focal pulmonary consolidation. There is no significant pleural effusion or pneumothorax. IMPRESSION: Cardiomegaly. There are no signs of pulmonary edema or focal pulmonary consolidation. Electronically Signed   By: Elmer Picker M.D.   On: 09/16/2021 16:43    EKG: Independently reviewed.  Sinus bradycardia 56.  Echocardiogram 08/2020. : Normal EF, diastolic dysfunction.   Assessment/Plan Principal Problem:   AMS (altered mental status) Active Problems:   Hypothyroidism due to acquired atrophy of thyroid   Sarcoidosis of lung (HCC)   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HCC)   Stage 3b chronic kidney disease (HCC)   AMS: Etiology is unclear int this patient, but d/d include post ictal state, TIA/ CVA. We will admit to progressive units and  Neuro check/ fall/ aspiration. Seizure precaution. RPR/ b 12 /ANA/ esr/ CRP/ blood culture/ urine culture/ ft4./tsh./lyme/ HIV. EEG. And neurology Dr. Georgiana Shore has been consulted and to see pt in am.   Hypothyroidism: We will cont levothyroxine po and ft4  and tsh.   Sarcoidosis lung: We will continue duoneb  DM II: Ssi/ accucehjecks q6 hours. Npo until bedside swallow.    CKD stage IIIB: Lab Results  Component Value Date   CREATININE 1.44 (H) 09/16/2021   CREATININE 1.22 (H) 09/16/2021   CREATININE 1.43 (H) 11/20/2019  Avoid contrast studies. , renally dose all meds.   DVT prophylaxis:  scd's   Code Status:  Full code    Family Communication:  Hughie Closs (Sister)  [phone] (Home Phone)   Disposition Plan:  TBD   Consults called:  Neurology   Admission status: Inpatient.     Para Skeans MD [facility] [phone] How to contact the [facility] Attending or Consulting provider [place] or covering provider during after hours [place], for this patient.    Check the care team in [facility] and look for a) attending/consulting [facility] provider listed and b) the [facility] team listed Log into [email or web address] and use [place] universal password to access. If you do not have the password, please contact the hospital operator. Locate the [facility] provider you are looking for under [facility] and page to a number that you can be directly reached. If you still have difficulty reaching the provider, please page the [facility] (Director on Call) for the Hospitalists listed on amion for assistance. [email or web address] Password [email or web address] 09/16/2021, 8:06 PM

## 2021-09-16 NOTE — ED Notes (Signed)
Per patients sister [name]) the patient had a platinum coil aneurysm clip placed several years ago and she has since had an MRI performed so she believes it to be compatible. Pt recently lost her wallet so patients sister unsure if she had an implant card.     Of note, the patients sister states that she has not been very compliant with her medications and she was never informed of the patient having a hx of seizures.

## 2021-09-16 NOTE — ED Provider Notes (Signed)
[facility] Emergency Department Provider Note  ____________________________________________   Event Date/Time   First MD Initiated Contact with Patient 09/16/21 1613     (approximate)  I have reviewed the triage vital signs and the nursing notes.   HISTORY  Chief Complaint Altered Mental Status   HPI Chelsey [name] is a 70 y.o. female with a past medical history of COPD, CAD, DM, gout, HTN, hypothyroidism, PVD, pulmonary sarcoidosis, seizures, OSA on 2 L nasal cannula at night per review of records, anemia, brain aneurysm and rheumatoid arthritis who presents accompanied by her sister for assessment of some altered mental status.  Per initial EMS report patient had some slurred speech and facial droop although EMS did not see this this was reported by family.  Per EMS patient was just confused and received 500 cc of normal saline in route.  On patient arrival she is quite agitated yelling and somewhat nonsensical.  She is attempting to rip out IV placed earlier and is giving nonsensical answers to questions about orientation such as date.  Further history is limited from the patient arrival secondary to altered mental status.         Past Medical History:  Diagnosis Date   Anemia    Aneurysm ([place])    Arthritis    RHEUMATOID ARTHRITIS   Asthma    Collagen vascular disease ([place])    COPD (chronic obstructive pulmonary disease) ([place])    Coronary artery disease    Diabetes mellitus without complication (HCC)    Edema    FEET/LEGS   GERD (gastroesophageal reflux disease)    Gout    H/O wheezing    History of hiatal hernia    Hypertension    Hypothyroidism    Neuropathy    Oxygen deficiency    HS   Peripheral vascular disease ([place])    Sarcoidosis of lung ([place])    Seizures ([place])    WITH BRAIN 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    Sleep apnea    OXYGEN AT NIGHT 3 L [place]    Patient Active Problem List   Diagnosis Date Noted   Morbid obesity ([place])  05/28/2021   Hyperparathyroidism due to renal insufficiency ([place]) 04/16/2020   Stage 3b chronic kidney disease ([place]) 04/16/2020   Complex renal cyst 10/18/2019   Urinary tract infection without hematuria 08/02/2019   Dysuria 08/02/2019   Essential hypertension, benign 08/02/2019   Right upper quadrant pain 12/07/2017   Iron deficiency anemia secondary to blood loss (chronic) 10/06/2017   Pain in right finger(s) 07/07/2017   Chronic pain of left wrist 03/04/2017   Pain in wrist 03/04/2017   Chronic pain of left ankle 11/03/2016   Chronic pain of left knee 11/03/2016   Right renal mass 11/03/2016   Hyperlipemia 07/29/2016   Recurrent ventral hernia    Ventral hernia without obstruction or gangrene 03/25/2016   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[place])    Foot pain 12/10/2015   Chronic gouty arthropathy without tophi 12/10/2015   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/10/2015   Sarcoidosis of lung ([place]) 12/10/2015   Sarcoidosis, nodular type 12/10/2015   Chronic foot pain, left 12/10/2015   Congenital pes planus of left foot 12/10/2015   Chronic idiopathic gout of multiple sites 08/05/2015   Sleep related hypoventilation/hypoxemia in other disease 04/29/2015   NAFLD (nonalcoholic fatty liver disease) 10/10/2014   Hypothyroidism due to acquired atrophy of thyroid 07/11/2014   Primary localized osteoarthrosis, lower leg 10/21/2012  Sarcoidosis 05/24/2008   Peripheral vascular disease ([place]) 02/15/2007   Hypertension 07/03/1999    Past Surgical History:  Procedure Laterality Date   BRAIN SURGERY  1990's   CATARACT EXTRACTION W/PHACO Left 06/11/2016   Procedure: CATARACT EXTRACTION PHACO AND INTRAOCULAR LENS PLACEMENT (IOC);  Surgeon: Birder Robson, MD;  Location: [facility] ORS;  Service: Ophthalmology;  Laterality: Left;  [place] 1.01 AP% 21.6 CDE 13.23 Fluid pack lot # [id number]   CEREBRAL ANEURYSM REPAIR  1990's   COILS   CORONARY ARTERY BYPASS GRAFT     EYE  SURGERY  2000's   bilateral cataract   FRACTURE SURGERY     HERNIA REPAIR  2000   ventral   STENT PLACEMENT VASCULAR ([place] [facility])     VEIN BYPASS SURGERY     VENTRAL HERNIA REPAIR N/A 04/29/2016   Procedure: Laparoscopic HERNIA REPAIR VENTRAL ADULT;  Surgeon: Jules Husbands, MD;  Location: [facility] ORS;  Service: General;  Laterality: N/A;    Prior to Admission medications   Medication Sig Start Date End Date Taking? Authorizing Provider  albuterol (VENTOLIN HFA) 108 (90 Base) MCG/ACT inhaler Inhale 2 puffs into the lungs every 6 (six) hours as needed for wheezing or shortness of breath.    [provider]  allopurinol (ZYLOPRIM) 300 MG tablet TAKE 1 TABLET BY MOUTH DAILY 08/07/21   [name], NP  amLODipine (NORVASC) 10 MG tablet Take 1 tablet (10 mg total) by mouth daily. 09/13/21   [name], NP  aspirin 81 MG EC tablet Take by mouth. 01/08/12   [provider]  aspirin EC 81 MG tablet Take 81 mg by mouth daily.    [provider]  atorvastatin (LIPITOR) 10 MG tablet TAKE 1 TABLET BY MOUTH DAILY FOR CHOLESTEROL 06/09/21   Lavera Guise, MD  Blood Glucose Monitoring Suppl (ACCU-CHEK AVIVA PLUS) W/DEVICE KIT Use as directed. 11/24/13   [provider]  celecoxib (CELEBREX) 100 MG capsule Take by mouth. 11/11/20 11/11/21  [provider]  cetirizine (ZYRTEC) 10 MG tablet Take 1 tablet (10 mg total) by mouth daily. 01/16/21   Luiz Ochoa, NP  cetirizine (ZYRTEC) 10 MG tablet Take 1 tablet by mouth daily. 01/16/21   [provider]  Cholecalciferol 25 MCG (1000 UT) capsule Take by mouth. 04/21/21 04/21/22  [provider]  cilostazol (PLETAL) 50 MG tablet TAKE 1 TABLET BY MOUTH TWICE A DAY 06/24/21   Lavera Guise, MD  colchicine 0.6 MG tablet TAKE 1 TO 2 TABLETS BY MOUTH DAILY AS NEEDED FOR GOUT FLARE **ONLY TAKE WITH GOUT FLARE** 04/16/21   Lavera Guise, MD  conjugated estrogens (PREMARIN) vaginal cream Place 1 Applicatorful  vaginally 2 (two) times a week. 08/01/20   Lavera Guise, MD  diclofenac Sodium (VOLTAREN) 1 % GEL Apply topically. 06/30/16   [provider]  Dulaglutide 1.5 MG/0.5ML SOPN Inject into the skin. 01/11/19   [provider]  ezetimibe (ZETIA) 10 MG tablet Take by mouth. 10/13/19   [provider]  FARXIGA 10 MG TABS tablet TAKE 1 TABLET BY MOUTH DAILY 03/24/21   Lavera Guise, MD  ferrous sulfate 325 (65 FE) MG tablet TAKE 1 TABLET BY MOUTH EVERY DAY WITH BREAKFAST 11/04/15   [provider]  fluticasone (FLONASE) 50 MCG/ACT nasal spray Frequency:QD   Dosage:50   MCG  Instructions:  Note:2 sprays each nostril daily Dose: 50MCG 02/15/07   [provider]  Fluticasone-Salmeterol (ADVAIR) 250-50 MCG/DOSE AEPB Inhale 1 puff into  the lungs 2 (two) times daily.    [provider]  gabapentin (NEURONTIN) 300 MG capsule TAKE 1 CAPSULE BY MOUTH 3 TIMES A DAY 08/07/21   [name], NP  glucose blood test strip Test sugar 4 x aday for uncontrolled dm on insulin E11.22 11/28/19   Lavera Guise, MD  hydrALAZINE (APRESOLINE) 10 MG tablet Take 1 tablet (10 mg total) by mouth 3 (three) times daily. 07/02/21   Lavera Guise, MD  hydrALAZINE (APRESOLINE) 10 MG tablet Take by mouth. 03/02/19   [provider]  hydroxychloroquine (PLAQUENIL) 200 MG tablet Take 1 tablet (200 mg total) by mouth 2 (two) times daily. 09/13/21   [name], NP  insulin degludec (TRESIBA FLEXTOUCH) 200 UNIT/ML FlexTouch Pen 30 Units with breakfast, with lunch, and with evening meal. 09/05/19   [provider]  insulin lispro (HUMALOG) 100 UNIT/ML KwikPen 30 units at breakfast 35 units at lunch 30 units at dinner 08/13/20   [provider]  insulin regular human CONCENTRATED (HUMULIN R U-500 KWIKPEN) 500 UNIT/ML kwikpen Take insulin 60 units three times a day 02/15/20   Kendell Bane, NP  LANCETS ULTRA THIN MISC Apply 1 each topically daily.  07/20/13   [provider]  levothyroxine (SYNTHROID) 50 MCG tablet TAKE 1 TABLET BY MOUTH IN THE MORNING ONAN EMPTY STOMACH FOR UNDERACTIVE THYROID 04/17/21   Lavera Guise, MD  linaclotide [facility]) 145 MCG CAPS capsule Take 1 capsule (145 mcg total) by mouth daily. 01/16/20   Kendell Bane, NP  losartan (COZAAR) 50 MG tablet TAKE 1 TABLET BY MOUTH DAILY FOR HYPENTENSION 03/12/21   Lavera Guise, MD  omeprazole (PRILOSEC) 20 MG capsule TAKE ONE CAPSULE BY MOUTH TWICE A DAY FOR GERD 06/09/21   Lavera Guise, MD  OXYGEN Inhale 3 L into the lungs. Nighttime use    [provider]  pentoxifylline (TRENTAL) 400 MG CR tablet Take by mouth. 09/22/19   [provider]  potassium chloride SA (KLOR-CON) 20 MEQ tablet TAKE 1 TABLET BY MOUTH DAILY 04/30/21   Lavera Guise, MD  torsemide (DEMADEX) 10 MG tablet Take 10 mg by mouth daily.    [provider]  Flossie Buffy SHORT PEN NEEDLES 31G X 8 MM [place] USE 3 TIMES A DAY 01/28/21   Lavera Guise, MD    Allergies Oxycodone-acetaminophen, Indomethacin, and Penicillins  Family History  Problem Relation Age of Onset   Heart disease Mother    Hypertension Mother    Diabetes Mother    Diabetes Father    Heart disease Father    Hypertension Father    Breast cancer Maternal Aunt     Social History Social History   Tobacco Use   Smoking status: Former    Packs/day: 1.00    Years: 30.00    Pack years: 30.00    Types: Cigarettes    Quit date: 01/27/2006    Years since quitting: 15.6   Smokeless tobacco: Never   Tobacco comments:    quit   Substance Use Topics   Alcohol use: No    Alcohol/week: 0.0 standard drinks   Drug use: No    Review of Systems  Review of Systems  Unable to perform ROS: Mental status change     ____________________________________________   PHYSICAL EXAM:  VITAL SIGNS: ED Triage Vitals  Enc Vitals Group     BP 09/16/21 1449 (!) 144/111     Pulse Rate 09/16/21 1449 100  Resp 09/16/21 1449 16     Temp  09/16/21 1449 98.2 F (36.8 C)     Temp Source 09/16/21 1449 Oral     SpO2 09/16/21 1449 100 %     Weight 09/16/21 1454 210 lb 12.8 oz (95.6 kg)     Height 09/16/21 1454 5' 3" (1.6 m)     Head Circumference --      Peak Flow --      Pain Score 09/16/21 1453 0     Pain Loc --      Pain Edu? --      Excl. in [place]? --    Vitals:   09/16/21 1715 09/16/21 1730  BP: (!) 152/66 (!) 152/68  Pulse: 78 76  Resp:    Temp:    SpO2: 98% 96%   Physical Exam Vitals and nursing note reviewed.  Constitutional:      General: She is in acute distress.     Appearance: She is well-developed. She is ill-appearing.  HENT:     Head: Normocephalic and atraumatic.     Right Ear: External ear normal.     Left Ear: External ear normal.     Nose: Nose normal.     Mouth/Throat:     Mouth: Mucous membranes are dry.  Eyes:     Conjunctiva/sclera: Conjunctivae normal.  Cardiovascular:     Rate and Rhythm: Normal rate and regular rhythm.     Heart sounds: No murmur heard. Pulmonary:     Effort: Pulmonary effort is normal. Tachypnea present. No respiratory distress.     Breath sounds: Normal breath sounds.  Abdominal:     Palpations: Abdomen is soft.     Tenderness: There is no abdominal tenderness.  Musculoskeletal:     Cervical back: Neck supple.  Skin:    General: Skin is warm and dry.     Capillary Refill: Capillary refill takes more than 3 seconds.  Neurological:     Mental Status: She is alert and oriented to person, place, and time.  Psychiatric:        Mood and Affect: Mood normal.        Behavior: Behavior is agitated, aggressive, hyperactive and combative.    Pupils are 1 mm symmetric reactive to light bilaterally.  Oropharynx is dry remainder of face scalp head and neck is unremarkable.  2+ radial pulses.  No obvious deformities or trauma evident to the extremities chest abdomen or back. ____________________________________________   LABS (all labs ordered are listed, but only  abnormal results are displayed)  Labs Reviewed  CBC - Abnormal; Notable for the following components:      Result Value   WBC 2.3 (*)    RBC 3.53 (*)    Hemoglobin 8.2 (*)    HCT 26.0 (*)    MCV 73.7 (*)    MCH 23.2 (*)    RDW 16.7 (*)    Platelets 117 (*)    All other components within normal limits  DIFFERENTIAL - Abnormal; Notable for the following components:   Neutro Abs 1.2 (*)    All other components within normal limits  COMPREHENSIVE METABOLIC PANEL - Abnormal; Notable for the following components:   Sodium 130 (*)    Glucose, Bld 647 (*)    Creatinine, Ser 1.22 (*)    Calcium 8.7 (*)    Alkaline Phosphatase 170 (*)    Total Bilirubin 1.3 (*)    GFR, Estimated 47 (*)    All other components within  normal limits  LACTIC ACID, PLASMA - Abnormal; Notable for the following components:   Lactic Acid, Venous 6.4 (*)    All other components within normal limits  OSMOLALITY - Abnormal; Notable for the following components:   Osmolality 301 (*)    All other components within normal limits  BLOOD GAS, VENOUS - Abnormal; Notable for the following components:   pH, Ven 7.21 (*)    pO2, Ven <31.0 (*)    Acid-base deficit 6.0 (*)    All other components within normal limits  URINALYSIS, COMPLETE (UACMP) WITH MICROSCOPIC - Abnormal; Notable for the following components:   Color, Urine STRAW (*)    APPearance CLEAR (*)    Glucose, UA >=500 (*)    All other components within normal limits  BETA-HYDROXYBUTYRIC ACID - Abnormal; Notable for the following components:   Beta-Hydroxybutyric Acid 0.28 (*)    All other components within normal limits  BASIC METABOLIC PANEL - Abnormal; Notable for the following components:   CO2 18 (*)    Glucose, Bld 358 (*)    Creatinine, Ser 1.44 (*)    GFR, Estimated 39 (*)    All other components within normal limits  CBG MONITORING, ED - Abnormal; Notable for the following components:   Glucose-Capillary >600 (*)    All other components  within normal limits  CBG MONITORING, ED - Abnormal; Notable for the following components:   Glucose-Capillary 365 (*)    All other components within normal limits  CBG MONITORING, ED - Abnormal; Notable for the following components:   Glucose-Capillary 327 (*)    All other components within normal limits  RESP PANEL BY RT-PCR (FLU A&B, COVID) ARPGX2  CULTURE, BLOOD (SINGLE)  URINE CULTURE  APTT  AMMONIA  ETHANOL  TSH  PROTIME-INR  URINE DRUG SCREEN, QUALITATIVE ([facility] ONLY)  URINALYSIS, ROUTINE W REFLEX MICROSCOPIC  HEMOGLOBIN A1C  BLOOD GAS, VENOUS  I-STAT CREATININE, ED  TYPE AND SCREEN  TROPONIN I (HIGH SENSITIVITY)   ____________________________________________  EKG  Sinus bradycardia with ventricular rate of 56, normal axis, unremarkable intervals and no clearance of acute ischemia or significant arrhythmia. ____________________________________________  RADIOLOGY  ED MD interpretation: CT head shows no evidence of hemorrhage, edema, ischemia, mass-effect or other acute intracranial process.  CT C-spine shows no evidence of acute C-spine injury or traumatic listhesis.  Chest x-ray shows some cardiomegaly but no edema, focal consolidation, effusion, rib fracture, pneumothorax or other acute thoracic process  Official radiology report(s): CT HEAD WO CONTRAST  Result Date: 09/16/2021 CLINICAL DATA:  Slurred speech. EXAM: CT HEAD WITHOUT CONTRAST CT CERVICAL SPINE WITHOUT CONTRAST TECHNIQUE: Multidetector CT imaging of the head and cervical spine was performed following the standard protocol without intravenous contrast. Multiplanar CT image reconstructions of the cervical spine were also generated. COMPARISON:  CT a head dated October 05, 2019. FINDINGS: CT HEAD FINDINGS Brain: No evidence of acute infarction, hemorrhage, hydrocephalus, extra-axial collection or mass lesion/mass effect. 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 Old high  right parietal and left anterior temporal lobe infarcts again noted. Stable mild atrophy and advanced chronic microvascular ischemic changes. Vascular: Calcified atherosclerosis at the skull base. No hyperdense vessel. Unchanged aneurysm coils along the planum sphenoidale on the right. Skull: Normal. Negative for fracture or focal lesion. Sinuses/Orbits: No acute finding. Other: None. CT CERVICAL SPINE FINDINGS Alignment: Straightening of the normal cervical lordosis. No listhesis. Skull base and vertebrae: No acute fracture. No primary bone lesion or focal pathologic process. Soft  tissues and spinal canal: No prevertebral fluid or swelling. No visible canal hematoma. Disc levels: Moderate disc height loss and endplate spurring at A2-N0 and C6-C7. Upper chest: Negative. Other: None. IMPRESSION: 1. No acute intracranial abnormality. 2. No acute cervical spine fracture or traumatic listhesis. Electronically Signed   By: Titus Dubin M.D.   On: 09/16/2021 15:42   CT CERVICAL SPINE WO CONTRAST  Result Date: 09/16/2021 CLINICAL DATA:  Slurred speech. EXAM: CT HEAD WITHOUT CONTRAST CT CERVICAL SPINE WITHOUT CONTRAST TECHNIQUE: Multidetector CT imaging of the head and cervical spine was performed following the standard protocol without intravenous contrast. Multiplanar CT image reconstructions of the cervical spine were also generated. COMPARISON:  CT a head dated October 05, 2019. FINDINGS: CT HEAD FINDINGS Brain: No evidence of acute infarction, hemorrhage, hydrocephalus, extra-axial collection or mass lesion/mass effect. 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 Stable mild atrophy and advanced chronic microvascular ischemic changes. Vascular: Calcified atherosclerosis at the skull base. No hyperdense vessel. Unchanged aneurysm coils along the planum sphenoidale on the right. Skull: Normal. Negative  for fracture or focal lesion. Sinuses/Orbits: No acute finding. Other: None. CT CERVICAL SPINE FINDINGS Alignment: Straightening of the normal cervical lordosis. No listhesis. Skull base and vertebrae: No acute fracture. No primary bone lesion or focal pathologic process. Soft tissues and spinal canal: No prevertebral fluid or swelling. No visible canal hematoma. Disc levels: Moderate disc height loss and endplate spurring at N3-Z7 and C6-C7. Upper chest: Negative. Other: None. IMPRESSION: 1. No acute intracranial abnormality. 2. No acute cervical spine fracture or traumatic listhesis. Electronically Signed   By: Titus Dubin M.D.   On: 09/16/2021 15:42   DG Chest Portable 1 View  Result Date: 09/16/2021 CLINICAL DATA:  Altered mental status EXAM: PORTABLE CHEST 1 VIEW COMPARISON:  Compared with report for previous examination done on 10/23/2015. Due to technical difficulties, images of the previous studies are not available for comparison FINDINGS: Transverse diameter of heart is increased. There are no signs of alveolar pulmonary edema or focal pulmonary consolidation. There is no significant pleural effusion or pneumothorax. IMPRESSION: Cardiomegaly. There are no signs of pulmonary edema or focal pulmonary consolidation. Electronically Signed   By: Elmer Picker M.D.   On: 09/16/2021 16:43    ____________________________________________   PROCEDURES  Procedure(s) performed (including Critical Care):  .1-3 Lead EKG Interpretation Performed by: Lucrezia Starch, MD Authorized by: Lucrezia Starch, MD     Interpretation: non-specific     ECG rate assessment: normal     Rhythm: sinus rhythm     Ectopy: none     Conduction: normal     ____________________________________________   INITIAL IMPRESSION / ASSESSMENT AND PLAN / ED COURSE      Presents with above-stated history exam for assessment of altered mental status.  On arrival patient is hypertensive with BP of 144/111 and  borderline tachycardic with a heart rate of 100 with a lower stable vital signs on room air.  On arrival she appears grossly encephalopathic and confused and is attempting to rip out her IV and run from staff.  She was immediately IVC and given sedation medication incidentally she is capacity to leave AMA at this time.  Differential is quite broad and includes acute encephalopathy from CVA, metabolic derangements, sepsis, toxic ingestion with a lower suspicion for trauma as there were no findings of significant trauma on exam and per sister who  is with patient and was saw patient earlier today and is not aware of any recent trauma.  CT head shows no evidence of hemorrhage, edema, ischemia, mass-effect or other acute intracranial process.  CT C-spine shows no evidence of acute C-spine injury or traumatic listhesis.  Chest x-ray shows some cardiomegaly but no edema, focal consolidation, effusion, rib fracture, pneumothorax or other acute thoracic process.  EKG without evidence of significant arrhythmia or ischemia.  CBC remarkable for leukopenia with a WBC count of 2.3 and hemoglobin of 8.2.  Platelets are slightly low at 117.  Hemoglobin on 11/19/2018 114.1.  CMP remarkable for glucose of 647, creatinine of 1.22 and alk phos of 170 compared to 246 1 year ago.  T bili is just at the upper limit of normal at 1.3.  No other significant electrolyte or metabolic derangements.  Pseudohyponatremia with sodium of 130.  VBG remarkable for pH of 7.21 with a PCO2 of 56 and a bicarb of 22.4.  Ammonia within normal is at 33.  Ethanol undetectable.  TSH unremarkable.  Is receivin receiving 1 L of IV fluids BMP remarkable for bicarb of 18, glucose of 358 and a creatinine of 1.44.  Anion gap is 13.  Beta hydroxybutyrate is at the upper limit of normal at 0.28.  Impression is HHS that seem to rapidly improved with IV fluids prior to insulin drip being initiated.  We will start patient on sliding scale insulin and give an  additional liter of fluids.  UA remarkable for some glucose but negative for ketones or evidence of infection.  Overall unclear etiology for patient's presentation today although is possible she had a TIA in the setting of very high blood sugars which could also cause her encephalopathy.  She has a mild hypercarbic respiratory acidosis and this was drawn immediately after she was sedated with Versed and Geodon on several reassessments she is breathing normally with a respiratory rate between 20 and 22.  Plan to obtain another VBG.  She does not have any vomiting at this time I think she is protecting her airway.  At this time I think patient's lactic acidosis secondary to her dehydration.  While she may have a viral illness  do not believe she is septic from bacterial acute infection at this time.  Out of an abundance of caution we will obtain a blood culture..  I will admit to medicine service for further evaluation management.      ____________________________________________   FINAL CLINICAL IMPRESSION(S) / ED DIAGNOSES  Final diagnoses:  Altered mental status, unspecified altered mental status type  Hyperglycemia    Medications  lactated ringers infusion ( Intravenous New Bag/Given 09/16/21 1756)  dextrose 5 % in lactated ringers infusion ( Intravenous Not Given 09/16/21 1739)  dextrose 50 % solution 0-50 mL (has no administration in time range)  potassium chloride 10 mEq in 100 mL IVPB ( Intravenous Canceled Entry 09/16/21 1757)  midazolam (VERSED) injection 1 mg (0 mg Intravenous Hold 09/16/21 1750)  insulin aspart (novoLOG) injection 0-15 Units (11 Units Subcutaneous Given 09/16/21 1805)  sodium chloride 0.9 % bolus 1,000 mL (1,000 mLs Intravenous New Bag/Given 09/16/21 1540)  insulin aspart (novoLOG) injection 10 Units (10 Units Intravenous Given 09/16/21 1541)  midazolam (VERSED) injection 2 mg (2 mg Intramuscular Given by Other 09/16/21 1607)  ziprasidone (GEODON) injection 20 mg (20 mg  Intramuscular Given 09/16/21 1607)  lactated ringers bolus 1,000 mL (0 mLs Intravenous Stopped 09/16/21 1750)  lactated ringers bolus 1,000 mL (1,000 mLs  Intravenous New Bag/Given 09/16/21 1742)     ED Discharge Orders     None        Note:  This document was prepared using Dragon voice recognition software and may include unintentional dictation errors.    Lucrezia Starch, MD 09/16/21 [phone]

## 2021-09-16 NOTE — ED Notes (Signed)
While attempting to room pt to 18H, pt became combative, confused and attempting to leave. Roderic Palau PA assessed, Dr Creig Hines to hall to assess. Pt refusing to go to room, stating she is leaving and cursing staff.  Verbal for IVC, versed and geodon Dr Tamala Julian  Security with pt

## 2021-09-16 NOTE — ED Notes (Signed)
Pt continuously moving and removing cardiac leads.

## 2021-09-16 NOTE — ED Notes (Signed)
PT  PLACED  UNDER  IVC PAPERS  PER  DR  Hulan Saas MD  [place] RN/ [facility] RN

## 2021-09-16 NOTE — ED Notes (Signed)
Pt continues to be very confused. Family at bedside. Ivs are cobanned. Mitts at bedside. Bed monitor on. IV infusion had to be removed from pump because pt unable to follow command to keep arm straight.

## 2021-09-16 NOTE — ED Triage Notes (Signed)
Patient to ER via [facility] from facility (unknown) for c/o AMS. Patient is only oriented to person (not normal for patient per EMS). Patient's stroke screen for EMS was negative, but facility called EMS for slurred speech and facial droop (not noted by EMS). Patient's initial BP was 119/14, 782 systolic for EMS. O2 sats ranged from 90-97% on RA per EMS. Blood sugar for EMS read "high". Was given 553ml bag (2nd 584ml bag infusing now). 12 lead was unremarkable per EMS except for a few PVC's.

## 2021-09-16 NOTE — ED Notes (Signed)
Bed Alarm put in place.

## 2021-09-16 NOTE — ED Triage Notes (Signed)
Pt brought in by EMS from [facility] with complaints slurred speech and facial drop. Daughter states that she "has been crossed up for the last week." She reports that she has seen her like this before when she has not taken her medication or they are changing up her medication. She is alert to person and place.

## 2021-09-17 ENCOUNTER — Encounter: Payer: Self-pay | Admitting: Internal Medicine

## 2021-09-17 ENCOUNTER — Telehealth: Payer: Self-pay

## 2021-09-17 DIAGNOSIS — J9611 Chronic respiratory failure with hypoxia: Secondary | ICD-10-CM | POA: Diagnosis present

## 2021-09-17 DIAGNOSIS — E1122 Type 2 diabetes mellitus with diabetic chronic kidney disease: Secondary | ICD-10-CM | POA: Diagnosis present

## 2021-09-17 DIAGNOSIS — F03918 Unspecified dementia, unspecified severity, with other behavioral disturbance: Secondary | ICD-10-CM | POA: Diagnosis present

## 2021-09-17 DIAGNOSIS — F05 Delirium due to known physiological condition: Secondary | ICD-10-CM | POA: Diagnosis present

## 2021-09-17 DIAGNOSIS — Z20822 Contact with and (suspected) exposure to covid-19: Secondary | ICD-10-CM | POA: Diagnosis present

## 2021-09-17 DIAGNOSIS — R4182 Altered mental status, unspecified: Secondary | ICD-10-CM | POA: Diagnosis not present

## 2021-09-17 DIAGNOSIS — Z794 Long term (current) use of insulin: Secondary | ICD-10-CM | POA: Diagnosis not present

## 2021-09-17 DIAGNOSIS — K5900 Constipation, unspecified: Secondary | ICD-10-CM | POA: Diagnosis present

## 2021-09-17 DIAGNOSIS — E1165 Type 2 diabetes mellitus with hyperglycemia: Secondary | ICD-10-CM | POA: Diagnosis not present

## 2021-09-17 DIAGNOSIS — E8729 Other acidosis: Secondary | ICD-10-CM | POA: Diagnosis present

## 2021-09-17 DIAGNOSIS — M069 Rheumatoid arthritis, unspecified: Secondary | ICD-10-CM | POA: Diagnosis present

## 2021-09-17 DIAGNOSIS — K760 Fatty (change of) liver, not elsewhere classified: Secondary | ICD-10-CM | POA: Diagnosis present

## 2021-09-17 DIAGNOSIS — N179 Acute kidney failure, unspecified: Secondary | ICD-10-CM

## 2021-09-17 DIAGNOSIS — D860 Sarcoidosis of lung: Secondary | ICD-10-CM | POA: Diagnosis not present

## 2021-09-17 DIAGNOSIS — E1151 Type 2 diabetes mellitus with diabetic peripheral angiopathy without gangrene: Secondary | ICD-10-CM | POA: Diagnosis present

## 2021-09-17 DIAGNOSIS — I2510 Atherosclerotic heart disease of native coronary artery without angina pectoris: Secondary | ICD-10-CM | POA: Diagnosis present

## 2021-09-17 DIAGNOSIS — G9341 Metabolic encephalopathy: Secondary | ICD-10-CM | POA: Diagnosis present

## 2021-09-17 DIAGNOSIS — J449 Chronic obstructive pulmonary disease, unspecified: Secondary | ICD-10-CM | POA: Diagnosis present

## 2021-09-17 DIAGNOSIS — R410 Disorientation, unspecified: Secondary | ICD-10-CM | POA: Diagnosis not present

## 2021-09-17 DIAGNOSIS — N2581 Secondary hyperparathyroidism of renal origin: Secondary | ICD-10-CM | POA: Diagnosis present

## 2021-09-17 DIAGNOSIS — E1142 Type 2 diabetes mellitus with diabetic polyneuropathy: Secondary | ICD-10-CM | POA: Diagnosis present

## 2021-09-17 DIAGNOSIS — D631 Anemia in chronic kidney disease: Secondary | ICD-10-CM | POA: Diagnosis present

## 2021-09-17 DIAGNOSIS — E1100 Type 2 diabetes mellitus with hyperosmolarity without nonketotic hyperglycemic-hyperosmolar coma (NKHHC): Secondary | ICD-10-CM | POA: Diagnosis present

## 2021-09-17 DIAGNOSIS — R739 Hyperglycemia, unspecified: Secondary | ICD-10-CM | POA: Diagnosis present

## 2021-09-17 DIAGNOSIS — N1832 Chronic kidney disease, stage 3b: Secondary | ICD-10-CM | POA: Diagnosis present

## 2021-09-17 DIAGNOSIS — E034 Atrophy of thyroid (acquired): Secondary | ICD-10-CM | POA: Diagnosis present

## 2021-09-17 DIAGNOSIS — E0801 Diabetes mellitus due to underlying condition with hyperosmolarity with coma: Secondary | ICD-10-CM

## 2021-09-17 DIAGNOSIS — G40909 Epilepsy, unspecified, not intractable, without status epilepticus: Secondary | ICD-10-CM | POA: Diagnosis present

## 2021-09-17 DIAGNOSIS — I129 Hypertensive chronic kidney disease with stage 1 through stage 4 chronic kidney disease, or unspecified chronic kidney disease: Secondary | ICD-10-CM | POA: Diagnosis present

## 2021-09-17 LAB — CBC WITH DIFFERENTIAL/PLATELET
Abs Immature Granulocytes: 0 10*3/uL (ref 0.00–0.07)
Basophils Absolute: 0 10*3/uL (ref 0.0–0.1)
Basophils Relative: 1 %
Eosinophils Absolute: 0.2 10*3/uL (ref 0.0–0.5)
Eosinophils Relative: 6 %
HCT: 35.6 % — ABNORMAL LOW (ref 36.0–46.0)
Hemoglobin: 11.5 g/dL — ABNORMAL LOW (ref 12.0–15.0)
Immature Granulocytes: 0 %
Lymphocytes Relative: 37 %
Lymphs Abs: 1.1 10*3/uL (ref 0.7–4.0)
MCH: 23.4 pg — ABNORMAL LOW (ref 26.0–34.0)
MCHC: 32.3 g/dL (ref 30.0–36.0)
MCV: 72.5 fL — ABNORMAL LOW (ref 80.0–100.0)
Monocytes Absolute: 0.3 10*3/uL (ref 0.1–1.0)
Monocytes Relative: 9 %
Neutro Abs: 1.4 10*3/uL — ABNORMAL LOW (ref 1.7–7.7)
Neutrophils Relative %: 47 %
Platelets: 159 10*3/uL (ref 150–400)
RBC: 4.91 MIL/uL (ref 3.87–5.11)
RDW: 16.9 % — ABNORMAL HIGH (ref 11.5–15.5)
WBC: 2.9 10*3/uL — ABNORMAL LOW (ref 4.0–10.5)
nRBC: 0 % (ref 0.0–0.2)

## 2021-09-17 LAB — BASIC METABOLIC PANEL
Anion gap: 7 (ref 5–15)
BUN: 9 mg/dL (ref 8–23)
CO2: 23 mmol/L (ref 22–32)
Calcium: 8.4 mg/dL — ABNORMAL LOW (ref 8.9–10.3)
Chloride: 107 mmol/L (ref 98–111)
Creatinine, Ser: 0.92 mg/dL (ref 0.44–1.00)
GFR, Estimated: >60 mL/min (ref >60)
Glucose, Bld: 260 mg/dL — ABNORMAL HIGH (ref 70–99)
Potassium: 3.7 mmol/L (ref 3.5–5.1)
Sodium: 137 mmol/L (ref 135–145)

## 2021-09-17 LAB — CBG MONITORING, ED
Glucose-Capillary: 167 mg/dL — ABNORMAL HIGH (ref 70–99)
Glucose-Capillary: 250 mg/dL — ABNORMAL HIGH (ref 70–99)
Glucose-Capillary: 329 mg/dL — ABNORMAL HIGH (ref 70–99)
Glucose-Capillary: 298 mg/dL — ABNORMAL HIGH (ref 70–99)
Glucose-Capillary: 286 mg/dL — ABNORMAL HIGH (ref 70–99)

## 2021-09-17 LAB — HEMOGLOBIN A1C
Hgb A1c MFr Bld: 12.8 % — ABNORMAL HIGH (ref 4.8–5.6)
Mean Plasma Glucose: 320.66 mg/dL

## 2021-09-17 LAB — LACTIC ACID, PLASMA
Lactic Acid, Venous: 1.4 mmol/L (ref 0.5–1.9)
Lactic Acid, Venous: 2.2 mmol/L (ref 0.5–1.9)

## 2021-09-17 LAB — MAGNESIUM: Magnesium: 1.6 mg/dL — ABNORMAL LOW (ref 1.7–2.4)

## 2021-09-17 LAB — URINE CULTURE: Culture: NO GROWTH

## 2021-09-17 LAB — PHOSPHORUS: Phosphorus: 2.4 mg/dL — ABNORMAL LOW (ref 2.5–4.6)

## 2021-09-17 MED ORDER — LORAZEPAM 2 MG/ML IJ SOLN
0.5000 mg | INTRAMUSCULAR | Status: DC | PRN
  Administered 2021-09-18 – 2021-09-20 (×3): 0.5 mg via INTRAVENOUS
  Filled 2021-09-17 (×3): qty 1

## 2021-09-17 MED ORDER — POTASSIUM & SODIUM PHOSPHATES 280-160-250 MG PO PACK
2.0000 | PACK | Freq: Three times a day (TID) | ORAL | Status: DC
  Administered 2021-09-17 (×2): 2 via ORAL
  Filled 2021-09-17 (×3): qty 2

## 2021-09-17 MED ORDER — LOSARTAN POTASSIUM 25 MG PO TABS
25.0000 mg | ORAL_TABLET | Freq: Every day | ORAL | Status: DC
  Administered 2021-09-17 – 2021-09-26 (×9): 25 mg via ORAL
  Filled 2021-09-17 (×10): qty 1

## 2021-09-17 MED ORDER — ASPIRIN EC 81 MG PO TBEC
81.0000 mg | DELAYED_RELEASE_TABLET | Freq: Every day | ORAL | Status: DC
  Administered 2021-09-17 – 2021-09-26 (×9): 81 mg via ORAL
  Filled 2021-09-17 (×9): qty 1

## 2021-09-17 MED ORDER — ATORVASTATIN CALCIUM 20 MG PO TABS
10.0000 mg | ORAL_TABLET | Freq: Every day | ORAL | Status: DC
  Administered 2021-09-19 – 2021-09-26 (×8): 10 mg via ORAL
  Filled 2021-09-17 (×8): qty 1

## 2021-09-17 MED ORDER — LINACLOTIDE 145 MCG PO CAPS
145.0000 ug | ORAL_CAPSULE | Freq: Every day | ORAL | Status: DC
  Administered 2021-09-19 – 2021-09-26 (×8): 145 ug via ORAL
  Filled 2021-09-17 (×9): qty 1

## 2021-09-17 MED ORDER — EZETIMIBE 10 MG PO TABS
10.0000 mg | ORAL_TABLET | Freq: Every day | ORAL | Status: DC
  Administered 2021-09-19 – 2021-09-26 (×8): 10 mg via ORAL
  Filled 2021-09-17 (×9): qty 1

## 2021-09-17 MED ORDER — HALOPERIDOL LACTATE 5 MG/ML IJ SOLN
2.0000 mg | Freq: Four times a day (QID) | INTRAMUSCULAR | Status: DC | PRN
  Administered 2021-09-18: 09:00:00 2 mg via INTRAMUSCULAR
  Filled 2021-09-17: qty 1

## 2021-09-17 MED ORDER — HEPARIN SODIUM (PORCINE) 5000 UNIT/ML IJ SOLN: 5000.0000 [IU] | Freq: Three times a day (TID) | INTRAMUSCULAR | Status: DC

## 2021-09-17 MED ORDER — INSULIN DETEMIR 100 UNIT/ML ~~LOC~~ SOLN
20.0000 [IU] | Freq: Two times a day (BID) | SUBCUTANEOUS | Status: DC
  Administered 2021-09-17: 20 [IU] via SUBCUTANEOUS
  Filled 2021-09-17 (×3): qty 0.2

## 2021-09-17 MED ORDER — DAPAGLIFLOZIN PROPANEDIOL 5 MG PO TABS
10.0000 mg | ORAL_TABLET | Freq: Every day | ORAL | Status: DC
  Administered 2021-09-19 – 2021-09-20 (×2): 10 mg via ORAL
  Filled 2021-09-17 (×4): qty 2

## 2021-09-17 MED ORDER — LEVOTHYROXINE SODIUM 50 MCG PO TABS
50.0000 ug | ORAL_TABLET | Freq: Every day | ORAL | Status: DC
  Administered 2021-09-18 – 2021-09-26 (×8): 50 ug via ORAL
  Filled 2021-09-17 (×10): qty 1

## 2021-09-17 MED ORDER — TRAZODONE HCL 50 MG PO TABS
25.0000 mg | ORAL_TABLET | Freq: Every evening | ORAL | Status: DC | PRN
  Administered 2021-09-20 – 2021-09-25 (×6): 25 mg via ORAL
  Filled 2021-09-17 (×6): qty 1

## 2021-09-17 MED ORDER — ALLOPURINOL 100 MG PO TABS
300.0000 mg | ORAL_TABLET | Freq: Every day | ORAL | Status: DC
  Administered 2021-09-19 – 2021-09-26 (×8): 300 mg via ORAL
  Filled 2021-09-17 (×8): qty 3

## 2021-09-17 MED ORDER — SODIUM BICARBONATE 650 MG PO TABS
650.0000 mg | ORAL_TABLET | Freq: Three times a day (TID) | ORAL | Status: DC
  Administered 2021-09-17: 650 mg via ORAL
  Filled 2021-09-17 (×5): qty 1

## 2021-09-17 MED ORDER — ALBUTEROL SULFATE (2.5 MG/3ML) 0.083% IN NEBU: 3.0000 mL | INHALATION_SOLUTION | Freq: Four times a day (QID) | RESPIRATORY_TRACT | Status: DC | PRN

## 2021-09-17 MED ORDER — AMLODIPINE BESYLATE 10 MG PO TABS
10.0000 mg | ORAL_TABLET | Freq: Every day | ORAL | Status: DC
  Administered 2021-09-17 – 2021-09-26 (×9): 10 mg via ORAL
  Filled 2021-09-17 (×6): qty 1
  Filled 2021-09-17: qty 2
  Filled 2021-09-17 (×2): qty 1

## 2021-09-17 MED ORDER — PANTOPRAZOLE SODIUM 40 MG PO TBEC
40.0000 mg | DELAYED_RELEASE_TABLET | Freq: Every day | ORAL | Status: DC
  Administered 2021-09-17 – 2021-09-26 (×9): 40 mg via ORAL
  Filled 2021-09-17 (×9): qty 1

## 2021-09-17 MED ORDER — HEPARIN SODIUM (PORCINE) 5000 UNIT/ML IJ SOLN
5000.0000 [IU] | Freq: Three times a day (TID) | INTRAMUSCULAR | Status: DC
  Administered 2021-09-17 – 2021-09-19 (×4): 5000 [IU] via SUBCUTANEOUS
  Filled 2021-09-17 (×5): qty 1

## 2021-09-17 MED ORDER — MOMETASONE FURO-FORMOTEROL FUM 200-5 MCG/ACT IN AERO
2.0000 | INHALATION_SPRAY | Freq: Two times a day (BID) | RESPIRATORY_TRACT | Status: DC
  Administered 2021-09-18 – 2021-09-26 (×17): 2 via RESPIRATORY_TRACT
  Filled 2021-09-17 (×3): qty 8.8

## 2021-09-17 MED ORDER — MAGNESIUM SULFATE 2 GM/50ML IV SOLN
2.0000 g | Freq: Once | INTRAVENOUS | Status: AC
  Administered 2021-09-17: 2 g via INTRAVENOUS
  Filled 2021-09-17: qty 50

## 2021-09-17 NOTE — ED Notes (Signed)
Pt refused to keep EKG leads on at this time - Pt allows RN to obtain VS at this time.

## 2021-09-17 NOTE — ED Notes (Signed)
Upon notifying patient that admission room was ready; patient became argumentative and very defensive; requesting to go to bathroom but not comprehending that a foley catheter was in place; stood from stretcher; patient pulled foley catheter out with balloon infated and dropped it into NT Shan's hand; Agricultural consultant Raquel and security notified

## 2021-09-17 NOTE — ED Notes (Signed)
EKG leads placed on the patients back - to prevent her from removing her leads.

## 2021-09-17 NOTE — ED Notes (Signed)
Admitting  MD to rescind IVC paperwork.  Pt is now alert, oriented and cooperative with care.  Pt still wishes to go home instead of admit  to hospital, but has been instructed regarding risks of leaving before treatment complete, and is aware to leave would be against medical advice.  Sister at bedside as well.

## 2021-09-17 NOTE — Progress Notes (Addendum)
Pt was admitted on the floor with no signs of distress but was agitated and refusing cardiac monitoring and vitals signs. Unabve to complete screening assessment pt only alert to self.Will continue to monitor.  Update 2250: Pt refused vitals signs, blood sugar check and medicine but was educated about its importance. Pt is resting and watching tv at this time. Charge nurse also came to encourage pt but still refused all pt care. MD Mansy made aware. Will continue to monitor.  Update 2305: Both sister on the contact information was called to see if one of the sister can come to help with pt medicine compliance but one went to voicemail and the other sister states " I cant stay with my sister tonight". Will continue to monitor.  Update 1126: MD Mansy placed order.Will continue to monitor.  Update 0000: pt refused blood sugar check and insulin at this time. Will continue to monitor.  Update 0500: Pt allowed staff to check vitals, blood sugar and took morning medicine. MD [place] made aware.Will notify incoming shift. Will continue to monitor.

## 2021-09-17 NOTE — Telephone Encounter (Signed)
Spoke with [facility] that due to pt is on hospital we holding a paperwork for home health aide

## 2021-09-17 NOTE — Progress Notes (Signed)
Inpatient Diabetes Program Recommendations  AACE/ADA: New Consensus Statement on Inpatient Glycemic Control (2015)  Target Ranges:  Prepandial:   less than 140 mg/dL      Peak postprandial:   less than 180 mg/dL (1-2 hours)      Critically ill patients:  140 - 180 mg/dL   Lab Results  Component Value Date   GLUCAP 250 (H) 09/17/2021   HGBA1C 12.8 (H) 09/16/2021    Review of Glycemic Control Results for Chelsey, [name] (MRN [id number]) as of 09/17/2021 08:58  Ref. Range 09/16/2021 19:21 09/16/2021 23:18 09/17/2021 03:28 09/17/2021 08:52  Glucose-Capillary Latest Ref Range: 70 - 99 mg/dL 284 (H) 162 (H) 167 (H) 250 (H)   Diabetes history: DM 2 Outpatient Diabetes medications:  Per last visit on 04/10/2021 at [facility] Tresiba 90 units daily Humalog 32 units tid with meals Current orders for Inpatient glycemic control:  Novolog 0-15 units q 4 hours  Inpatient Diabetes Program Recommendations:    Note A1C significantly increased earlier this year (9.3% in 04/10/2021).   Note patient currently combative and confused.  May consider adding Levemir 20 units bid (this is less than 1/2 of patient's home dose).  Once diet started, will need SQ meal coverage.   Thanks,  Adah Perl, RN, BC-ADM Inpatient Diabetes Coordinator Pager [phone]  (8a-5p)

## 2021-09-17 NOTE — Progress Notes (Signed)
Eeg done 

## 2021-09-17 NOTE — ED Notes (Signed)
Pt anxious, wanting to leave, explained unable to leave at this time due to medical issues,  Pt removes gown, attempting to pull out IV, calms down once explained sister is on her way

## 2021-09-17 NOTE — ED Notes (Signed)
MD Mansey called about patient status by Charge RN Raquel

## 2021-09-17 NOTE — Procedures (Signed)
Routine EEG Report  Chelsey [name] is a 70 y.o. female with a history of seizures who is undergoing an EEG to evaluate for seizures.  Report: This EEG was acquired with electrodes placed according to the International 10-20 electrode system (including Fp1, Fp2, F3, F4, C3, C4, P3, P4, O1, O2, T3, T4, T5, T6, A1, A2, Fz, Cz, Pz). The following electrodes were missing or displaced: none.  The occipital dominant rhythm was 8.5 Hz. This activity is reactive to stimulation. Drowsiness was manifested by background fragmentation; deeper stages of sleep were not identified. There was focal slowing over the left temporal region. There were no interictal epileptiform discharges. There were no electrographic seizures identified. There was no abnormal response to photic stimulation. Hyperventilation was not performed.  Impression and clinical correlation: This EEG was obtained while awake and drowsy and is abnormal due to focal slowing over the left temporal region indicating focal cerebral dysfunction in that area.    No electrographic seizures were observed during this recording.  Su Monks, MD [facility] [phone]  If 7pm- 7am, please page neurology on call as listed in [place].

## 2021-09-17 NOTE — ED Notes (Signed)
Sister: Hughie Closs [phone] contacted concerning patient's status

## 2021-09-17 NOTE — Progress Notes (Signed)
PROGRESS NOTE    Chelsey [name]  DDU:[id number] DOB: [birth year] DOA: 09/16/2021 PCP: Lavera Guise, MD   Chief complaint.  Altered mental status. Brief Narrative:  Chelsey [name] is a 70 year old female with history of COPD, on nocturnal oxygen with obstructive sleep apnea, type 2 diabetes, coronary disease, essential hypertension, hypothyroidism, peripheral vascular disease, pulmonary sarcoidosis, history of seizures with history of brain aneurysm, rheumatoid arthritis to the hospital with altered mental status. I have discussed with patient and her sister this morning, she has been compliant with her insulin for diabetes, she does not have any recent sickness, no nausea vomiting or diarrhea. Was very confused yesterday, agitated arriving the emergency room.  Was given sedatives.  She had severe hyperglycemia with glucose more than 600, lactic acidosis of 6.0.  She was given fluids, she also received a insulin drip.  She does not have anion gap, no DKA.    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)   Stage 3b chronic kidney disease ([place])  Acute encephalopathy.  Most likely metabolic due to severe hyperglycemia. Uncontrolled diabetes with diabetes hyperosmolality with altered mental status. Severe lactic acidosis. Elevated troponin secondary to diabetes hyperosmolality. Seizure disorder. Brain CT scan did not show any acute changes. Patient mental status has improved entirely.  She is currently alert and oriented x4.  Also interviewed patient with the presence of her sister, she also confirmed that patient mental status is back to baseline.  She does not have any suicidal ideation. At this point, I will discontinue IVC.  Patient refused staying in the hospital at this time, but she will allow me to  obtain additional blood works before leaving hospital. Her condition could also be secondary to seizure, she has seizure disorder in the past. But she is not committed to complete EEG.  At this point, she will be referred to outpatient neurology. I also reviewed her home insulin regimen, doses appear to be adequate.  She will need to follow-up with her PCP in the near future.  Acute kidney injury. Hypomagnesemia Mild hypophosphatemia. Patient renal function has normalized. Will replete magnesium. Will give oral Phos 1-2 doses until she signed her self out., phosphorus is mildly low at 2.4.   Sarcoidosis of the lung. Follow-up with PCP as outpatient.  Obstructive sleep apnea. Continue nocturnal oxygen.  Based on my assessment, patient has ability to make her medical decisions.  She indicated that she will sign herself out [place], she has the capacity to do so. I explained to her that it is necessary for her to stay in the hospital to figure out what happened to her.  But she states that that she has to go home because she has a lot of work to do at home.  DVT prophylaxis: SCDs Code Status: full Family Communication: Sister at bedside Disposition Plan:    Status is: Inpatient          I/O last 3 completed shifts: In: 2041.3 [IV Piggyback:2041.3] Out: 2900 [Urine:2900] No intake/output data recorded.     Consultants:  None  Procedures: None  Antimicrobials: None  Subjective: Patient states that she feels well today.  She states that that she was not sick before she was confused yesterday.  She knows the date, she has sufficient knowledge about her medical  conditions, she knows current events. She states that that she can no longer stay in the hospital today.  She also states that if she will come back to the hospital tomorrow if she does not feel well. Currently, she denies any short of breath or cough. She has no abdominal pain or nausea vomiting.   She has some mild constipation.   Objective: Vitals:   09/17/21 0630 09/17/21 0800 09/17/21 0830 09/17/21 0900  BP: 124/62 (!) 135/57 138/70 121/60  Pulse: (!) 53 (!) 58 (!) 54 61  Resp: 12 14    Temp:      TempSrc:      SpO2: 95% 96% 98% 99%  Weight:      Height:        Intake/Output Summary (Last 24 hours) at 09/17/2021 0948 Last data filed at 09/16/2021 2249 Gross per 24 hour  Intake 2041.34 ml  Output 2900 ml  Net -858.66 ml   Filed Weights   09/16/21 1454  Weight: 95.6 kg    Examination:  General exam: Appears calm and comfortable  Respiratory system: Clear to auscultation. Respiratory effort normal. Cardiovascular system: S1 & S2 heard, RRR. No JVD, murmurs, rubs, gallops or clicks. No pedal edema. Gastrointestinal system: Abdomen is nondistended, soft and nontender. No organomegaly or masses felt. Normal bowel sounds heard. Central nervous system: Alert and oriented x4. No focal neurological deficits. Extremities: Symmetric 5 x 5 power. Skin: No rashes, lesions or ulcers Psychiatry: Judgement and insight appear normal. Mood & affect appropriate.     Data Reviewed: I have personally reviewed following labs and imaging studies  CBC: Recent Labs  Lab 09/16/21 1507  WBC 2.3*  NEUTROABS 1.2*  HGB 8.2*  HCT 26.0*  MCV 73.7*  PLT 102*   Basic Metabolic Panel: Recent Labs  Lab 09/16/21 1507 09/16/21 1618 09/17/21 0917  NA 130* 135 137  K 4.8 4.2 3.7  CL 99 104 107  CO2 22 18* 23  GLUCOSE 647* 358* 260*  BUN 20 18 9   CREATININE 1.22* 1.44* 0.92  CALCIUM 8.7* 9.0 8.4*  MG  --   --  1.6*  PHOS  --   --  2.4*   GFR: Estimated Creatinine Clearance: 61.7 mL/min (by C-G formula based on SCr of 0.92 mg/dL). Liver Function Tests: Recent Labs  Lab 09/16/21 1507  AST 21  ALT 15  ALKPHOS 170*  BILITOT 1.3*  PROT 6.9  ALBUMIN 3.5   No results for input(s): LIPASE, AMYLASE in the last 168 hours. Recent Labs  Lab 09/16/21 1618  AMMONIA 33    Coagulation Profile: Recent Labs  Lab 09/16/21 1713  INR 1.1   Cardiac Enzymes: No results for input(s): CKTOTAL, CKMB, CKMBINDEX, TROPONINI in the last 168 hours. BNP (last 3 results) No results for input(s): PROBNP in the last 8760 hours. HbA1C: Recent Labs    09/16/21 1507  HGBA1C 12.8*   CBG: Recent Labs  Lab 09/16/21 1800 09/16/21 1921 09/16/21 2318 09/17/21 0328 09/17/21 0852  GLUCAP 327* 284* 162* 167* 250*   Lipid Profile: No results for input(s): CHOL, HDL, LDLCALC, TRIG, CHOLHDL, LDLDIRECT in the last 72 hours. Thyroid Function Tests: Recent Labs    09/16/21 1618  TSH 3.480   Anemia Panel: No results for input(s): VITAMINB12, FOLATE, FERRITIN, TIBC, IRON, RETICCTPCT in the last 72 hours. Sepsis Labs: Recent Labs  Lab 09/16/21 1618 09/16/21 1825 09/17/21 0917  PROCALCITON  --  <0.10  --   LATICACIDVEN 6.4*  --  1.4  Recent Results (from the past 240 hour(s))  Resp Panel by RT-PCR (Flu A&B, Covid) Urine, Catheterized     Status: None   Collection Time: 09/16/21  5:14 PM   Specimen: Urine, Catheterized; Nasopharyngeal(NP) swabs in vial transport medium  Result Value Ref Range Status   SARS Coronavirus 2 by RT PCR NEGATIVE NEGATIVE Final    Comment: (NOTE) SARS-CoV-2 target nucleic acids are NOT DETECTED.  The SARS-CoV-2 RNA is generally detectable in upper respiratory specimens during the acute phase of infection. The lowest concentration of SARS-CoV-2 viral copies this assay can detect is 138 copies/mL. A negative result does not preclude SARS-Cov-2 infection and should not be used as the sole basis for treatment or other patient management decisions. A negative result may occur with  improper specimen collection/handling, submission of specimen other than nasopharyngeal swab, presence of viral mutation(s) within the areas targeted by this assay, and inadequate number of viral copies(<138 copies/mL). A negative result must be combined  with clinical observations, patient history, and epidemiological information. The expected result is Negative.  Fact Sheet for Patients:  EntrepreneurPulse.com.au  Fact Sheet for Healthcare Providers:  IncredibleEmployment.be  This test is no t yet approved or cleared by the [place] FDA and  has been authorized for detection and/or diagnosis of SARS-CoV-2 by FDA under an Emergency Use Authorization (EUA). This EUA will remain  in effect (meaning this test can be used) for the duration of the COVID-19 declaration under Section 564(b)(1) of the Act, 21 U.S.C.section 360bbb-3(b)(1), unless the authorization is terminated  or revoked sooner.       Influenza A by PCR NEGATIVE NEGATIVE Final   Influenza B by PCR NEGATIVE NEGATIVE Final    Comment: (NOTE) The Xpert Xpress SARS-CoV-2/FLU/RSV plus assay is intended as an aid in the diagnosis of influenza from Nasopharyngeal swab specimens and should not be used as a sole basis for treatment. Nasal washings and aspirates are unacceptable for Xpert Xpress SARS-CoV-2/FLU/RSV testing.  Fact Sheet for Patients: EntrepreneurPulse.com.au  Fact Sheet for Healthcare Providers: IncredibleEmployment.be  This test is not yet approved or cleared by the [place] FDA and has been authorized for detection and/or diagnosis of SARS-CoV-2 by FDA under an Emergency Use Authorization (EUA). This EUA will remain in effect (meaning this test can be used) for the duration of the COVID-19 declaration under Section 564(b)(1) of the Act, 21 U.S.C. section 360bbb-3(b)(1), unless the authorization is terminated or revoked.  Performed at [facility], [address] [place], [place] [address]   Blood culture (routine single)     Status: None (Preliminary result)   Collection Time: 09/16/21  5:36 PM   Specimen: BLOOD  Result Value Ref Range Status   Specimen  Description BLOOD RIGHT ANTECUBITAL  Final   Special Requests   Final    BOTTLES DRAWN AEROBIC AND ANAEROBIC Blood Culture adequate volume   Culture   Final    NO GROWTH < 24 HOURS Performed at [facility], [address] [place], [place] [address]    Report Status PENDING  Incomplete         Radiology Studies: CT HEAD WO CONTRAST  Result Date: 09/16/2021 CLINICAL DATA:  Slurred speech. EXAM: CT HEAD WITHOUT CONTRAST CT CERVICAL SPINE WITHOUT CONTRAST TECHNIQUE: Multidetector CT imaging of the head and cervical spine was performed following the standard protocol without intravenous contrast. Multiplanar CT image reconstructions of the cervical spine were also generated. COMPARISON:  CT a head dated October 05, 2019. FINDINGS: CT  HEAD FINDINGS Brain: No evidence of acute infarction, hemorrhage, hydrocephalus, extra-axial collection or mass lesion/mass effect. 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 Stable mild atrophy and advanced chronic microvascular ischemic changes. Vascular: Calcified atherosclerosis at the skull base. No hyperdense vessel. Unchanged aneurysm coils along the planum sphenoidale on the right. Skull: Normal. Negative for fracture or focal lesion. Sinuses/Orbits: No acute finding. Other: None. CT CERVICAL SPINE FINDINGS Alignment: Straightening of the normal cervical lordosis. No listhesis. Skull base and vertebrae: No acute fracture. No primary bone lesion or focal pathologic process. Soft tissues and spinal canal: No prevertebral fluid or swelling. No visible canal hematoma. Disc levels: Moderate disc height loss and endplate spurring at V7-C5 and C6-C7. Upper chest: Negative. Other: None. IMPRESSION: 1. No acute intracranial abnormality. 2. No acute cervical spine fracture or traumatic listhesis. Electronically Signed   By: Titus Dubin M.D.   On:  09/16/2021 15:42   CT CERVICAL SPINE WO CONTRAST  Result Date: 09/16/2021 CLINICAL DATA:  Slurred speech. EXAM: CT HEAD WITHOUT CONTRAST CT CERVICAL SPINE WITHOUT CONTRAST TECHNIQUE: Multidetector CT imaging of the head and cervical spine was performed following the standard protocol without intravenous contrast. Multiplanar CT image reconstructions of the cervical spine were also generated. COMPARISON:  CT a head dated October 05, 2019. FINDINGS: CT HEAD FINDINGS Brain: No evidence of acute infarction, hemorrhage, hydrocephalus, extra-axial collection or mass lesion/mass effect. 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 Stable mild atrophy and advanced chronic microvascular ischemic changes. Vascular: Calcified atherosclerosis at the skull base. No hyperdense vessel. Unchanged aneurysm coils along the planum sphenoidale on the right. Skull: Normal. Negative for fracture or focal lesion. Sinuses/Orbits: No acute finding. Other: None. CT CERVICAL SPINE FINDINGS Alignment: Straightening of the normal cervical lordosis. No listhesis. Skull base and vertebrae: No acute fracture. No primary bone lesion or focal pathologic process. Soft tissues and spinal canal: No prevertebral fluid or swelling. No visible canal hematoma. Disc levels: Moderate disc height loss and endplate spurring at Y8-F0 and C6-C7. Upper chest: Negative. Other: None. IMPRESSION: 1. No acute intracranial abnormality. 2. No acute cervical spine fracture or traumatic listhesis. Electronically Signed   By: Titus Dubin M.D.   On: 09/16/2021 15:42   DG Chest Portable 1 View  Result Date: 09/16/2021 CLINICAL DATA:  Altered mental status EXAM: PORTABLE CHEST 1 VIEW COMPARISON:  Compared with report for previous examination done on 10/23/2015. Due to technical difficulties, images of the previous studies are not available for comparison  FINDINGS: Transverse diameter of heart is increased. There are no signs of alveolar pulmonary edema or focal pulmonary consolidation. There is no significant pleural effusion or pneumothorax. IMPRESSION: Cardiomegaly. There are no signs of pulmonary edema or focal pulmonary consolidation. Electronically Signed   By: Elmer Picker M.D.   On: 09/16/2021 16:43        Scheduled Meds:  heparin  5,000 Units Subcutaneous Q8H   insulin aspart  0-15 Units Subcutaneous Q4H   insulin detemir  20 Units Subcutaneous BID   Continuous Infusions:   LOS: 0 days    Time spent: 32 minutes    Sharen Hones, MD [facility]   To contact the attending provider between 7A-7P or the covering provider during after hours 7P-7A, please log into the web site [email or web address] and access using universal [place]  password for that web site. If you do not have the password, please call the hospital operator.  09/17/2021, 9:48 AM

## 2021-09-17 NOTE — Consult Note (Signed)
NEURO HOSPITALIST CONSULT NOTE   Requestig physician: Dr. Roosevelt Locks  Reason for Consult: AMS  History obtained from:  Chart     HPI:                                                                                                                                          Chelsey [name] is an 70 y.o. female with a PMHx of COPD, CAD, DM, gout, HTN, hypothyroidism, PVD, pulmonary sarcoidosis, seizures (with brain aneurysm, no seizures since), OSA on 2 L nasal cannula at night, anemia, brain aneurysm s/p coiling (1990s) and rheumatoid arthritis who presented to the ED yesterday from her facility [facility]) for [place]. Facility had called EMS for slurred speech and facial droop, which were not noted by EMS. Patient's initial BP with was 631/49, 702 systolic for EMS. O2 sats ranged from 90-97% on RA per EMS. Blood sugar for EMS was "high". Daughter stated that the patient "has been crossed up for the last week." She reported that she has seen her mother like this before when she has not taken her medication. The patient has been confused and intermittently combative with staff during her stay in the ED, attempting to leave. She was involuntarily committed at that time and has required sedation. Per family this behavior is very different and at baseline patient is alert, awake, oriented, ambulatory and independent. On exam by Hospitalist, the patient was very somnolent, confused and restless.  Initial DDx per Hospitalist included post-ictal state, TIA/CVA and metabolic encephalopathy.   Regarding her aneurysm, per her sister the patient had a platinum coil placed several years ago and she has since had an MRI performed so she believes it to be compatible. Patient recently lost her wallet so sister unsure if she had an implant card. Sister also stated that she has not been very compliant with her medications.  Home medications include ASA, Pletal, atorvastatin, Neurontin 300 TID, Zyrtec and  Plaquenil.    Past Medical History:  Diagnosis Date   Anemia    Aneurysm (HCC)    Arthritis    RHEUMATOID ARTHRITIS   Asthma    Collagen vascular disease (HCC)    COPD (chronic obstructive pulmonary disease) ([place])    Coronary artery disease    Diabetes mellitus without complication (HCC)    Edema    FEET/LEGS   GERD (gastroesophageal reflux disease)    Gout    H/O wheezing    History of hiatal hernia    Hypertension    Hypothyroidism    Neuropathy    Oxygen deficiency    HS   Peripheral vascular disease (HCC)    Sarcoidosis of lung (HCC)    Seizures (HCC)    WITH BRAIN 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    Sleep apnea  OXYGEN AT NIGHT 3 L [place]    Past Surgical History:  Procedure Laterality Date   BRAIN SURGERY  1990's   CATARACT EXTRACTION W/PHACO Left 06/11/2016   Procedure: CATARACT EXTRACTION PHACO AND INTRAOCULAR LENS PLACEMENT (IOC);  Surgeon: Birder Robson, MD;  Location: [facility] ORS;  Service: Ophthalmology;  Laterality: Left;  [place] 1.01 AP% 21.6 CDE 13.23 Fluid pack lot # [id number]   CEREBRAL ANEURYSM REPAIR  1990's   COILS   CORONARY ARTERY BYPASS GRAFT     EYE SURGERY  2000's   bilateral cataract   FRACTURE SURGERY     HERNIA REPAIR  2000   ventral   STENT PLACEMENT VASCULAR ([place] [facility])     VEIN BYPASS SURGERY     VENTRAL HERNIA REPAIR N/A 04/29/2016   Procedure: Laparoscopic HERNIA REPAIR VENTRAL ADULT;  Surgeon: Jules Husbands, MD;  Location: [facility] ORS;  Service: General;  Laterality: N/A;    Family History  Problem Relation Age of Onset   Heart disease Mother    Hypertension Mother    Diabetes Mother    Diabetes Father    Heart disease Father    Hypertension Father    Breast cancer Maternal Aunt           Social History:  reports that she quit smoking about 15 years ago. Her smoking use included cigarettes. She has a 30.00 pack-year smoking history. She has never used smokeless tobacco. She reports that she does not drink alcohol and does not use  drugs.  Allergies  Allergen Reactions   Oxycodone-Acetaminophen Other (See Comments)    Hallucinations Hallucinations HALLUCINATIONS Other reaction(s): Other (See Comments) HALLUCINATIONS Other reaction(s): Other (See Comments) Hallucinations Hallucinations HALLUCINATIONS Hallucinations Other reaction(s): Other (See Comments), Other (See Comments) HALLUCINATIONS Hallucinations Hallucinations HALLUCINATIONS Other reaction(s): Other (See Comments) HALLUCINATIONS Other reaction(s): Other (See Comments) Hallucinations Hallucinations HALLUCINATIONS Hallucinations Other reaction(s): Other (See Comments) HALLUCINATIONS Other reaction(s): Other (See Comments) Hallucinations Hallucinations HALLUCINATIONS Hallucinations    Indomethacin Other (See Comments)    BURN HOLE IN STOMACH BURN HOLE IN STOMACH Other reaction(s): Other (See Comments), Other (See Comments) BURN HOLE IN STOMACH BURN HOLE IN STOMACH BURN HOLE IN STOMACH BURN HOLE IN STOMACH BURN HOLE IN STOMACH    Penicillins Rash    HOME MEDICATIONS:                                                                                                                      No current facility-administered medications on file prior to encounter.   Current Outpatient Medications on File Prior to Encounter  Medication Sig Dispense Refill   albuterol (VENTOLIN HFA) 108 (90 Base) MCG/ACT inhaler Inhale 2 puffs into the lungs every 6 (six) hours as needed for wheezing or shortness of breath.     allopurinol (ZYLOPRIM) 300 MG tablet TAKE 1 TABLET BY MOUTH DAILY 90 tablet 1   amLODipine (NORVASC) 10 MG tablet Take 1 tablet (10 mg total) by mouth daily. 90 tablet 1   aspirin 81  MG EC tablet Take by mouth.     aspirin EC 81 MG tablet Take 81 mg by mouth daily.     atorvastatin (LIPITOR) 10 MG tablet TAKE 1 TABLET BY MOUTH DAILY FOR CHOLESTEROL 90 tablet 1   Blood Glucose Monitoring Suppl (ACCU-CHEK AVIVA PLUS) W/DEVICE KIT Use as  directed.     celecoxib (CELEBREX) 100 MG capsule Take by mouth.     cetirizine (ZYRTEC) 10 MG tablet Take 1 tablet (10 mg total) by mouth daily. 30 tablet 11   cetirizine (ZYRTEC) 10 MG tablet Take 1 tablet by mouth daily.     Cholecalciferol 25 MCG (1000 UT) capsule Take by mouth.     cilostazol (PLETAL) 50 MG tablet TAKE 1 TABLET BY MOUTH TWICE A DAY 180 tablet 0   colchicine 0.6 MG tablet TAKE 1 TO 2 TABLETS BY MOUTH DAILY AS NEEDED FOR GOUT FLARE **ONLY TAKE WITH GOUT FLARE** 60 tablet 3   conjugated estrogens (PREMARIN) vaginal cream Place 1 Applicatorful vaginally 2 (two) times a week. 42.5 g 12   diclofenac Sodium (VOLTAREN) 1 % GEL Apply topically.     Dulaglutide 1.5 MG/0.5ML SOPN Inject into the skin.     ezetimibe (ZETIA) 10 MG tablet Take by mouth.     FARXIGA 10 MG TABS tablet TAKE 1 TABLET BY MOUTH DAILY 30 tablet 3   ferrous sulfate 325 (65 FE) MG tablet TAKE 1 TABLET BY MOUTH EVERY DAY WITH BREAKFAST     fluticasone (FLONASE) 50 MCG/ACT nasal spray Frequency:QD   Dosage:50   MCG  Instructions:  Note:2 sprays each nostril daily Dose: 50MCG     Fluticasone-Salmeterol (ADVAIR) 250-50 MCG/DOSE AEPB Inhale 1 puff into the lungs 2 (two) times daily.     gabapentin (NEURONTIN) 300 MG capsule TAKE 1 CAPSULE BY MOUTH 3 TIMES A DAY 270 capsule 1   glucose blood test strip Test sugar 4 x aday for uncontrolled dm on insulin E11.22 120 each 12   hydrALAZINE (APRESOLINE) 10 MG tablet Take 1 tablet (10 mg total) by mouth 3 (three) times daily. 90 tablet 3   hydrALAZINE (APRESOLINE) 10 MG tablet Take by mouth.     hydroxychloroquine (PLAQUENIL) 200 MG tablet Take 1 tablet (200 mg total) by mouth 2 (two) times daily. 60 tablet 2   insulin degludec (TRESIBA FLEXTOUCH) 200 UNIT/ML FlexTouch Pen 30 Units with breakfast, with lunch, and with evening meal.     insulin lispro (HUMALOG) 100 UNIT/ML KwikPen 30 units at breakfast 35 units at lunch 30 units at dinner     insulin regular human  CONCENTRATED (HUMULIN R U-500 KWIKPEN) 500 UNIT/ML kwikpen Take insulin 60 units three times a day 4 pen 3   LANCETS ULTRA THIN MISC Apply 1 each topically daily.      levothyroxine (SYNTHROID) 50 MCG tablet TAKE 1 TABLET BY MOUTH IN THE MORNING ONAN EMPTY STOMACH FOR UNDERACTIVE THYROID 90 tablet 3   linaclotide (LINZESS) 145 MCG CAPS capsule Take 1 capsule (145 mcg total) by mouth daily. 30 capsule 3   losartan (COZAAR) 50 MG tablet TAKE 1 TABLET BY MOUTH DAILY FOR HYPENTENSION 90 tablet 3   omeprazole (PRILOSEC) 20 MG capsule TAKE ONE CAPSULE BY MOUTH TWICE A DAY FOR GERD 180 capsule 2   OXYGEN Inhale 3 L into the lungs. Nighttime use     pentoxifylline (TRENTAL) 400 MG CR tablet Take by mouth.     potassium chloride SA (KLOR-CON) 20 MEQ tablet TAKE 1 TABLET BY MOUTH DAILY  90 tablet 1   torsemide (DEMADEX) 10 MG tablet Take 10 mg by mouth daily.     ULTICARE SHORT PEN NEEDLES 31G X 8 MM MISC USE 3 TIMES A DAY 300 each 1     ROS:                                                                                                                                       Unable to obtain a reliable ROS due to AMS. Patient does not endorse any pain.    Blood pressure 124/62, pulse (!) 53, temperature 98.2 F (36.8 C), temperature source Oral, resp. rate 12, height _0  (1.6 m), weight 95.6 kg, SpO2 95 %.   General Examination:                                                                                                       Physical Exam  HEENT-  [place]/AT  Lungs- Respirations unlabored Extremities- No edema  Neurological Examination Mental Status: Awake and alert. Speech fluent and non-dysarthric. Pleasant and attempts to be cooperative. No agitation noted. Intact comprehension for basic commands and simple questions. Cannot perform complex commands, such as a 3-step directional command or correct movements for cerebellar testing. Perseverates at times. Did start to pick at EEG leads during  exam, requiring redirection. Could name a thumb, pinky finger and pen, but not an index finger or pen. Mildly impaired repetition. Concentration impaired; not able to recite months of the year backwards despite being able to do so forwards. Poor insight. Unable to recall the name of the president or the state directly to the [place] of [place].  Cranial Nerves: II: Temporal visual fields intact with no extinction to DSS. PERRL.   III,IV, VI: EOMI. No nystagmus. No ptosis.  V: Temp sensation equal bilaterally VII: Smile symmetric VIII: hearing intact to voice IX,X: No hypophonia XI: Symmetric XII: Midline tongue extension Motor: BUE 5/5 proximally and distally, grossly in the context of not following commands well. No pronator drift. No asymmetry.  BLE: Moves BLE symmetrically to command with 4-5/5 strength in the context of decreased abliity to follow detailed commands for testing of specific muscle groups.  Sensory: Temp and FT intact bilaterally with no extinction to DSS. Required multiple trials due to cognitive/comprehension deficit.  Deep Tendon Reflexes: 2+ bilateral brachioradialis. Difficulty eliciting patellar and achilles reflrexes.  Plantars: Right: downgoing   Left: downgoing Cerebellar: No gross ataxia during attempts to test FNF bilaterally, with multiple  errors and requirement for redirection due to cognitive/communication deficits.  Gait: Deferred due to falls risk concerns.    Lab Results: Basic Metabolic Panel: Recent Labs  Lab 09/16/21 1507 09/16/21 1618  NA 130* 135  K 4.8 4.2  CL 99 104  CO2 22 18*  GLUCOSE 647* 358*  BUN 20 18  CREATININE 1.22* 1.44*  CALCIUM 8.7* 9.0    CBC: Recent Labs  Lab 09/16/21 1507  WBC 2.3*  NEUTROABS 1.2*  HGB 8.2*  HCT 26.0*  MCV 73.7*  PLT 117*    Cardiac Enzymes: No results for input(s): CKTOTAL, CKMB, CKMBINDEX, TROPONINI in the last 168 hours.  Lipid Panel: No results for input(s): CHOL, TRIG, HDL, CHOLHDL, VLDL,  LDLCALC in the last 168 hours.  Imaging: CT HEAD WO CONTRAST  Result Date: 09/16/2021 CLINICAL DATA:  Slurred speech. EXAM: CT HEAD WITHOUT CONTRAST CT CERVICAL SPINE WITHOUT CONTRAST TECHNIQUE: Multidetector CT imaging of the head and cervical spine was performed following the standard protocol without intravenous contrast. Multiplanar CT image reconstructions of the cervical spine were also generated. COMPARISON:  CT a head dated October 05, 2019. FINDINGS: CT HEAD FINDINGS Brain: No evidence of acute infarction, hemorrhage, hydrocephalus, extra-axial collection or mass lesion/mass effect. 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 Stable mild atrophy and advanced chronic microvascular ischemic changes. Vascular: Calcified atherosclerosis at the skull base. No hyperdense vessel. Unchanged aneurysm coils along the planum sphenoidale on the right. Skull: Normal. Negative for fracture or focal lesion. Sinuses/Orbits: No acute finding. Other: None. CT CERVICAL SPINE FINDINGS Alignment: Straightening of the normal cervical lordosis. No listhesis. Skull base and vertebrae: No acute fracture. No primary bone lesion or focal pathologic process. Soft tissues and spinal canal: No prevertebral fluid or swelling. No visible canal hematoma. Disc levels: Moderate disc height loss and endplate spurring at G2-X5 and C6-C7. Upper chest: Negative. Other: None. IMPRESSION: 1. No acute intracranial abnormality. 2. No acute cervical spine fracture or traumatic listhesis. Electronically Signed   By: Titus Dubin M.D.   On: 09/16/2021 15:42   CT CERVICAL SPINE WO CONTRAST  Result Date: 09/16/2021 CLINICAL DATA:  Slurred speech. EXAM: CT HEAD WITHOUT CONTRAST CT CERVICAL SPINE WITHOUT CONTRAST TECHNIQUE: Multidetector CT imaging of the head and cervical spine was performed following the standard protocol without  intravenous contrast. Multiplanar CT image reconstructions of the cervical spine were also generated. COMPARISON:  CT a head dated October 05, 2019. FINDINGS: CT HEAD FINDINGS Brain: No evidence of acute infarction, hemorrhage, hydrocephalus, extra-axial collection or mass lesion/mass effect. 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 Stable mild atrophy and advanced chronic microvascular ischemic changes. Vascular: Calcified atherosclerosis at the skull base. No hyperdense vessel. Unchanged aneurysm coils along the planum sphenoidale on the right. Skull: Normal. Negative for fracture or focal lesion. Sinuses/Orbits: No acute finding. Other: None. CT CERVICAL SPINE FINDINGS Alignment: Straightening of the normal cervical lordosis. No listhesis. Skull base and vertebrae: No acute fracture. No primary bone lesion or focal pathologic process. Soft tissues and spinal canal: No prevertebral fluid or swelling. No visible canal hematoma. Disc levels: Moderate disc height loss and endplate spurring at M8-U1 and C6-C7. Upper chest: Negative. Other: None. IMPRESSION: 1. No acute intracranial abnormality. 2. No acute cervical spine fracture or traumatic listhesis. Electronically Signed   By: Titus Dubin  M.D.   On: 09/16/2021 15:42   DG Chest Portable 1 View  Result Date: 09/16/2021 CLINICAL DATA:  Altered mental status EXAM: PORTABLE CHEST 1 VIEW COMPARISON:  Compared with report for previous examination done on 10/23/2015. Due to technical difficulties, images of the previous studies are not available for comparison FINDINGS: Transverse diameter of heart is increased. There are no signs of alveolar pulmonary edema or focal pulmonary consolidation. There is no significant pleural effusion or pneumothorax. IMPRESSION: Cardiomegaly. There are no signs of pulmonary edema or focal pulmonary consolidation.  Electronically Signed   By: Elmer Picker M.D.   On: 09/16/2021 16:43     Assessment: 70 year old female presenting from her facility with AMS.  1. Exam reveals and alert patient with confusion, poor concentration and inability to perform complex commands, such as a 3-step directional command or the correct movements for cerebellar testing. Perseverates at times. Could name a thumb, pinky finger and pen, but not an index finger or pen. Mildly impaired repetition. Concentration impaired; not able to recite months of the year backwards despite being able to do so forwards. Poor insight. Unable to recall the name of the president or the state directly to the [place] of [place].  2. CT head: No acute intracranial abnormality. Old bilateral basal ganglia and right thalamus lacunar infarcts are stable. 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 Stable mild atrophy and advanced chronic microvascular ischemic changes. 3. TSH is normal.  4. eGFR is low at 39 based on Cr of 1.44. Transaminases and ammonia are normal. T. Bili mildly elevated. Lactate elevated at 6.4. WBC low at 2.3. HgbA1c markedly elevated at 12. U/A unremarkable. EtOH < 10. Tox screen negative.  5. Blood and urine cultures are pending.  6. Severely elevated glucose of 647 on admission. Levels this high can provoke focal seizures.  7. DDx includes a subclinical or unwitnessed partial complex seizure triggered by hyperglycemia, with subsequent postictal state versus toxic/metabolic encephalopathy given her multiple medications and poor renal function. Presentation not consistent with subarachnoid hemorrhage. No meningismus or fever to suggest a meningitis. 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   Recommendations: 1. Agree with obtaining EEG.  2. Agree with obtaining RPR, B12, ANA, ESR, CRP, as documented in Hospitalist note.  3. MRI brain. Most likely the  aneurysm coil is MRI compatible based on history provided by patient's daughter. She states patient had MRIs after the coil was placed and that it is composed of platinum (non-ferromagnetic).   Addendum: EEG: This EEG was obtained while awake and drowsy and is abnormal due to focal slowing over the left temporal region indicating focal cerebral dysfunction in that area. No electrographic seizures were observed during this recording.    Electronically signed: Dr. Kerney Elbe 09/17/2021, 8:01 AM

## 2021-09-17 NOTE — ED Notes (Signed)
Admitting doctor at bedside, sister also at bedside to discuss treatment plan

## 2021-09-18 DIAGNOSIS — R410 Disorientation, unspecified: Secondary | ICD-10-CM | POA: Diagnosis not present

## 2021-09-18 DIAGNOSIS — D860 Sarcoidosis of lung: Secondary | ICD-10-CM | POA: Diagnosis not present

## 2021-09-18 DIAGNOSIS — E1165 Type 2 diabetes mellitus with hyperglycemia: Secondary | ICD-10-CM

## 2021-09-18 DIAGNOSIS — G9341 Metabolic encephalopathy: Secondary | ICD-10-CM | POA: Diagnosis not present

## 2021-09-18 LAB — BASIC METABOLIC PANEL
Anion gap: 8 (ref 5–15)
BUN: 10 mg/dL (ref 8–23)
CO2: 24 mmol/L (ref 22–32)
Calcium: 8.9 mg/dL (ref 8.9–10.3)
Chloride: 103 mmol/L (ref 98–111)
Creatinine, Ser: 1.1 mg/dL — ABNORMAL HIGH (ref 0.44–1.00)
GFR, Estimated: 54 mL/min — ABNORMAL LOW (ref >60)
Glucose, Bld: 341 mg/dL — ABNORMAL HIGH (ref 70–99)
Potassium: 4.4 mmol/L (ref 3.5–5.1)
Sodium: 135 mmol/L (ref 135–145)

## 2021-09-18 LAB — RESPIRATORY PANEL BY PCR

## 2021-09-18 LAB — GLUCOSE, CAPILLARY
Glucose-Capillary: 307 mg/dL — ABNORMAL HIGH (ref 70–99)
Glucose-Capillary: 290 mg/dL — ABNORMAL HIGH (ref 70–99)
Glucose-Capillary: 303 mg/dL — ABNORMAL HIGH (ref 70–99)
Glucose-Capillary: 132 mg/dL — ABNORMAL HIGH (ref 70–99)

## 2021-09-18 LAB — MAGNESIUM: Magnesium: 1.9 mg/dL (ref 1.7–2.4)

## 2021-09-18 LAB — PHOSPHORUS: Phosphorus: 3.2 mg/dL (ref 2.5–4.6)

## 2021-09-18 MED ORDER — QUETIAPINE FUMARATE 25 MG PO TABS
50.0000 mg | ORAL_TABLET | Freq: Every day | ORAL | Status: DC
  Administered 2021-09-18 – 2021-09-24 (×7): 50 mg via ORAL
  Filled 2021-09-18 (×7): qty 2

## 2021-09-18 MED ORDER — INSULIN DETEMIR 100 UNIT/ML ~~LOC~~ SOLN
30.0000 [IU] | Freq: Two times a day (BID) | SUBCUTANEOUS | Status: DC
Start: 2021-09-18 — End: 2021-09-19
  Administered 2021-09-18 – 2021-09-19 (×3): 30 [IU] via SUBCUTANEOUS
  Filled 2021-09-18 (×4): qty 0.3

## 2021-09-18 MED ORDER — HALOPERIDOL LACTATE 5 MG/ML IJ SOLN
5.0000 mg | Freq: Four times a day (QID) | INTRAMUSCULAR | Status: DC | PRN
  Administered 2021-09-20 – 2021-09-23 (×2): 5 mg via INTRAMUSCULAR
  Filled 2021-09-18 (×3): qty 1

## 2021-09-18 MED ORDER — INSULIN ASPART 100 UNIT/ML IJ SOLN
0.0000 [IU] | Freq: Three times a day (TID) | INTRAMUSCULAR | Status: DC
  Administered 2021-09-18: 18:00:00 11 [IU] via SUBCUTANEOUS
  Administered 2021-09-18: 2 [IU] via SUBCUTANEOUS
  Administered 2021-09-19: 5 [IU] via SUBCUTANEOUS
  Administered 2021-09-19 (×2): 3 [IU] via SUBCUTANEOUS
  Administered 2021-09-20: 8 [IU] via SUBCUTANEOUS
  Administered 2021-09-20: 3 [IU] via SUBCUTANEOUS
  Administered 2021-09-20: 08:00:00 2 [IU] via SUBCUTANEOUS
  Administered 2021-09-20: 13:00:00 5 [IU] via SUBCUTANEOUS
  Administered 2021-09-21: 3 [IU] via SUBCUTANEOUS
  Administered 2021-09-21: 18:00:00 8 [IU] via SUBCUTANEOUS
  Administered 2021-09-21: 5 [IU] via SUBCUTANEOUS
  Administered 2021-09-21: 21:00:00 11 [IU] via SUBCUTANEOUS
  Administered 2021-09-22 (×3): 8 [IU] via SUBCUTANEOUS
  Administered 2021-09-23: 21:00:00 3 [IU] via SUBCUTANEOUS
  Administered 2021-09-23 (×3): 8 [IU] via SUBCUTANEOUS
  Administered 2021-09-24: 13:00:00 2 [IU] via SUBCUTANEOUS
  Administered 2021-09-24 (×2): 5 [IU] via SUBCUTANEOUS
  Administered 2021-09-24: 22:00:00 3 [IU] via SUBCUTANEOUS
  Administered 2021-09-25: 17:00:00 2 [IU] via SUBCUTANEOUS
  Administered 2021-09-25: 3 [IU] via SUBCUTANEOUS
  Administered 2021-09-25: 11 [IU] via SUBCUTANEOUS
  Administered 2021-09-26: 3 [IU] via SUBCUTANEOUS
  Filled 2021-09-18 (×27): qty 1

## 2021-09-18 NOTE — Progress Notes (Signed)
Patient agitated and yelling on the phone to her family members. Patient states she "needs her money" to "pay bills." Patient speech pressured and rapid. Patient continued to speak in raised voice tones about needing her money and how upset she is that her family is "running around with her money." Patient attempting to remove PIV and medical ID bracelet. Patient medicated with prn ativan and able to be redirected. Sitter remains in place. Patient awaiting eval by psych.

## 2021-09-18 NOTE — Progress Notes (Signed)
PROGRESS NOTE    Chelsey [name]  SEG:[id number] DOB: [birth year] DOA: 09/16/2021 PCP: Lavera Guise, MD   Chief complaint altered mental status Brief Narrative:   Chelsey [name] is a 70 year old female with history of COPD, on nocturnal oxygen with obstructive sleep apnea, type 2 diabetes, coronary disease, essential hypertension, hypothyroidism, peripheral vascular disease, pulmonary sarcoidosis, history of seizures with history of brain aneurysm, rheumatoid arthritis to the hospital with altered mental status. I have discussed with patient and her sister this morning, she has been compliant with her insulin for diabetes, she does not have any recent sickness, no nausea vomiting or diarrhea. Was very confused yesterday, agitated arriving the emergency room.  Was given sedatives.  She had severe hyperglycemia with glucose more than 600, lactic acidosis of 6.0.  She was given fluids, she also received a insulin drip.  She does not have anion gap, no DKA.  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  Most likely metabolic due to severe hyperglycemia. Uncontrolled diabetes with diabetes hyperosmolality with altered mental status. Severe lactic acidosis. Elevated troponin secondary to diabetes hyperosmolality. Seizure disorder. 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 Patient glucose is better.  Continue Levemir and sliding scale insulin. Patient continues to have agitation and confusion in the nighttime.  She has poor sleep.  Psychiatry consult was obtained, patient will be placed on IVC again due to risk to herself. Has been seen by neurology, obtain MRI of the brain. Per patient daughter, patient mother had very similar  symptoms at age of 30, ended up diagnosed with Alzheimer's dementia. Patient can potentially has dementia and delirium. Patient currently does not have any evidence of infection.  Acute kidney injury. Hypomagnesemia Mild hypophosphatemia. Condition had improved.    Sarcoidosis of the lungs. Follow-up with PCP as outpatient.  Obstruct sleep apnea. Continue nocturnal oxygen.  Neutropenia. Patient also has mild neutropenia, etiology still unclear.  Obtain respiratory viral panel.   DVT prophylaxis: SCDs Code Status: full Family Communication: Sister at bedside Disposition Plan:      Status is: Inpatient due to severity of disease and altered mental status        I/O last 3 completed shifts: In: 1521.3 [P.O.:480; IV Piggyback:1041.3] Out: 4100 [Urine:4100] No intake/output data recorded.     Consultants:  Neurology and psychiatry.  Procedures: None  Antimicrobials: None  Subjective: Patient did not sleep last night, she was very confused and agitated, threatening to leave the hospital. She still have significant confusion this morning, but more quiet. Denies any fever or chills. No headache or dizziness. No short of breath or cough. No abdominal pain or nausea vomiting.  Objective: Vitals:   09/17/21 1851 09/17/21 2012 09/18/21 0456 09/18/21 0812  BP: 138/67 132/65 (!) 168/76 (!) 120/97  Pulse: 64 84 69 74  Resp: 18  16 17   Temp:   97.6 F (36.4 C) (!) 97.5 F (36.4 C)  TempSrc:    Oral  SpO2: 96% 94% 98% 100%  Weight:      Height:        Intake/Output Summary (Last 24 hours) at 09/18/2021 1048 Last data filed at 09/18/2021 0503 Gross per 24 hour  Intake 480 ml  Output 1200 ml  Net -  720 ml   Filed Weights   09/16/21 1454  Weight: 95.6 kg    Examination:  General exam: Appears calm and comfortable  Respiratory system: Clear to auscultation. Respiratory effort normal. Cardiovascular system: S1 & S2 heard, RRR. No JVD, murmurs, rubs,  gallops or clicks. No pedal edema. Gastrointestinal system: Abdomen is nondistended, soft and nontender. No organomegaly or masses felt. Normal bowel sounds heard. Central nervous system: Alert and oriented x3. No focal neurological deficits. Extremities: Symmetric 5 x 5 power. Skin: No rashes, lesions or ulcers Psychiatry: Mood & affect appropriate.     Data Reviewed: I have personally reviewed following labs and imaging studies  CBC: Recent Labs  Lab 09/16/21 1507 09/17/21 0917  WBC 2.3* 2.9*  NEUTROABS 1.2* 1.4*  HGB 8.2* 11.5*  HCT 26.0* 35.6*  MCV 73.7* 72.5*  PLT 117* 322   Basic Metabolic Panel: Recent Labs  Lab 09/16/21 1507 09/16/21 1618 09/17/21 0917 09/18/21 0441  NA 130* 135 137 135  K 4.8 4.2 3.7 4.4  CL 99 104 107 103  CO2 22 18* 23 24  GLUCOSE 647* 358* 260* 341*  BUN 20 18 9 10   CREATININE 1.22* 1.44* 0.92 1.10*  CALCIUM 8.7* 9.0 8.4* 8.9  MG  --   --  1.6* 1.9  PHOS  --   --  2.4* 3.2   GFR: Estimated Creatinine Clearance: 51.6 mL/min (A) (by C-G formula based on SCr of 1.1 mg/dL (H)). Liver Function Tests: Recent Labs  Lab 09/16/21 1507  AST 21  ALT 15  ALKPHOS 170*  BILITOT 1.3*  PROT 6.9  ALBUMIN 3.5   No results for input(s): LIPASE, AMYLASE in the last 168 hours. Recent Labs  Lab 09/16/21 1618  AMMONIA 33   Coagulation Profile: Recent Labs  Lab 09/16/21 1713  INR 1.1   Cardiac Enzymes: No results for input(s): CKTOTAL, CKMB, CKMBINDEX, TROPONINI in the last 168 hours. BNP (last 3 results) No results for input(s): PROBNP in the last 8760 hours. HbA1C: Recent Labs    09/16/21 1507  HGBA1C 12.8*   CBG: Recent Labs  Lab 09/17/21 0852 09/17/21 1349 09/17/21 1656 09/17/21 1958 09/18/21 0449  GLUCAP 250* 329* 298* 286* 307*   Lipid Profile: No results for input(s): CHOL, HDL, LDLCALC, TRIG, CHOLHDL, LDLDIRECT in the last 72 hours. Thyroid Function Tests: Recent Labs    09/16/21 1618  TSH 3.480   Anemia  Panel: No results for input(s): VITAMINB12, FOLATE, FERRITIN, TIBC, IRON, RETICCTPCT in the last 72 hours. Sepsis Labs: Recent Labs  Lab 09/16/21 1618 09/16/21 1825 09/17/21 0917 09/17/21 1446  PROCALCITON  --  <0.10  --   --   LATICACIDVEN 6.4*  --  1.4 2.2*    Recent Results (from the past 240 hour(s))  Resp Panel by RT-PCR (Flu A&B, Covid) Urine, Catheterized     Status: None   Collection Time: 09/16/21  5:14 PM   Specimen: Urine, Catheterized; Nasopharyngeal(NP) swabs in vial transport medium  Result Value Ref Range Status   SARS Coronavirus 2 by RT PCR NEGATIVE NEGATIVE Final    Comment: (NOTE) SARS-CoV-2 target nucleic acids are NOT DETECTED.  The SARS-CoV-2 RNA is generally detectable in upper respiratory specimens during the acute phase of infection. The lowest concentration of SARS-CoV-2 viral copies this assay can detect is 138 copies/mL. A negative result does not preclude SARS-Cov-2 infection and should not be used as the sole basis for treatment or other patient management decisions. A negative result may occur with  improper specimen collection/handling, submission of specimen other than nasopharyngeal swab, presence of viral mutation(s) within the areas targeted by this assay, and inadequate number of viral copies(<138 copies/mL). A negative result must be combined with clinical observations, patient history, and epidemiological information. The expected result is Negative.  Fact Sheet for Patients:  EntrepreneurPulse.com.au  Fact Sheet for Healthcare Providers:  IncredibleEmployment.be  This test is no t yet approved or cleared by the [place] FDA and  has been authorized for detection and/or diagnosis of SARS-CoV-2 by FDA under an Emergency Use Authorization (EUA). This EUA will remain  in effect (meaning this test can be used) for the duration of the COVID-19 declaration under Section 564(b)(1) of the Act,  21 U.S.C.section 360bbb-3(b)(1), unless the authorization is terminated  or revoked sooner.       Influenza A by PCR NEGATIVE NEGATIVE Final   Influenza B by PCR NEGATIVE NEGATIVE Final    Comment: (NOTE) The Xpert Xpress SARS-CoV-2/FLU/RSV plus assay is intended as an aid in the diagnosis of influenza from Nasopharyngeal swab specimens and should not be used as a sole basis for treatment. Nasal washings and aspirates are unacceptable for Xpert Xpress SARS-CoV-2/FLU/RSV testing.  Fact Sheet for Patients: EntrepreneurPulse.com.au  Fact Sheet for Healthcare Providers: IncredibleEmployment.be  This test is not yet approved or cleared by the [place] FDA and has been authorized for detection and/or diagnosis of SARS-CoV-2 by FDA under an Emergency Use Authorization (EUA). This EUA will remain in effect (meaning this test can be used) for the duration of the COVID-19 declaration under Section 564(b)(1) of the Act, 21 U.S.C. section 360bbb-3(b)(1), unless the authorization is terminated or revoked.  Performed at [facility], [address] [place], [place] [address]   Urine Culture     Status: None   Collection Time: 09/16/21  5:14 PM   Specimen: Urine, Random  Result Value Ref Range Status   Specimen Description   Final    URINE, RANDOM Performed at [facility], [address], [place], [place] [address]    Special Requests   Final    NONE Performed at [facility], [address] [place], [place] [address]    Culture   Final    NO GROWTH Performed at [facility], [place] [address] [place], [place] [address]    Report Status 09/17/2021 FINAL  Final  Blood culture (routine single)     Status: None (Preliminary result)   Collection Time: 09/16/21  5:36 PM   Specimen: BLOOD  Result Value Ref Range Status   Specimen Description BLOOD RIGHT ANTECUBITAL  Final   Special Requests   Final     BOTTLES DRAWN AEROBIC AND ANAEROBIC Blood Culture adequate volume   Culture   Final    NO GROWTH 2 DAYS Performed at [facility], [address], [place], [place] [address]    Report Status PENDING  Incomplete         Radiology Studies: CT HEAD WO CONTRAST  Result Date: 09/16/2021 CLINICAL DATA:  Slurred speech. EXAM: CT HEAD WITHOUT CONTRAST CT CERVICAL SPINE WITHOUT CONTRAST TECHNIQUE: Multidetector CT imaging of the head and cervical spine was performed following the standard protocol without intravenous contrast. Multiplanar CT image reconstructions of the cervical spine were also generated. COMPARISON:  CT a head dated October 05, 2019. FINDINGS: CT HEAD FINDINGS Brain: No evidence of acute infarction, hemorrhage, hydrocephalus, extra-axial collection or mass lesion/mass effect. Old bilateral basal ganglia and right thalamus lacunar  infarcts again noted. 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 Stable mild atrophy and advanced chronic microvascular ischemic changes. Vascular: Calcified atherosclerosis at the skull base. No hyperdense vessel. Unchanged aneurysm coils along the planum sphenoidale on the right. Skull: Normal. Negative for fracture or focal lesion. Sinuses/Orbits: No acute finding. Other: None. CT CERVICAL SPINE FINDINGS Alignment: Straightening of the normal cervical lordosis. No listhesis. Skull base and vertebrae: No acute fracture. No primary bone lesion or focal pathologic process. Soft tissues and spinal canal: No prevertebral fluid or swelling. No visible canal hematoma. Disc levels: Moderate disc height loss and endplate spurring at F8-H8 and C6-C7. Upper chest: Negative. Other: None. IMPRESSION: 1. No acute intracranial abnormality. 2. No acute cervical spine fracture or traumatic listhesis. Electronically Signed   By: Titus Dubin M.D.   On: 09/16/2021 15:42   CT CERVICAL SPINE WO CONTRAST  Result Date:  09/16/2021 CLINICAL DATA:  Slurred speech. EXAM: CT HEAD WITHOUT CONTRAST CT CERVICAL SPINE WITHOUT CONTRAST TECHNIQUE: Multidetector CT imaging of the head and cervical spine was performed following the standard protocol without intravenous contrast. Multiplanar CT image reconstructions of the cervical spine were also generated. COMPARISON:  CT a head dated October 05, 2019. FINDINGS: CT HEAD FINDINGS Brain: No evidence of acute infarction, hemorrhage, hydrocephalus, extra-axial collection or mass lesion/mass effect. 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 Stable mild atrophy and advanced chronic microvascular ischemic changes. Vascular: Calcified atherosclerosis at the skull base. No hyperdense vessel. Unchanged aneurysm coils along the planum sphenoidale on the right. Skull: Normal. Negative for fracture or focal lesion. Sinuses/Orbits: No acute finding. Other: None. CT CERVICAL SPINE FINDINGS Alignment: Straightening of the normal cervical lordosis. No listhesis. Skull base and vertebrae: No acute fracture. No primary bone lesion or focal pathologic process. Soft tissues and spinal canal: No prevertebral fluid or swelling. No visible canal hematoma. Disc levels: Moderate disc height loss and endplate spurring at E9-H3 and C6-C7. Upper chest: Negative. Other: None. IMPRESSION: 1. No acute intracranial abnormality. 2. No acute cervical spine fracture or traumatic listhesis. Electronically Signed   By: Titus Dubin M.D.   On: 09/16/2021 15:42   DG Chest Portable 1 View  Result Date: 09/16/2021 CLINICAL DATA:  Altered mental status EXAM: PORTABLE CHEST 1 VIEW COMPARISON:  Compared with report for previous examination done on 10/23/2015. Due to technical difficulties, images of the previous studies are not available for comparison FINDINGS: Transverse diameter of heart is increased. There are no  signs of alveolar pulmonary edema or focal pulmonary consolidation. There is no significant pleural effusion or pneumothorax. IMPRESSION: Cardiomegaly. There are no signs of pulmonary edema or focal pulmonary consolidation. Electronically Signed   By: Elmer Picker M.D.   On: 09/16/2021 16:43   EEG adult  Result Date: 09/17/2021 Derek Jack, MD     09/17/2021  2:11 PM Routine EEG Report TOINETTE LACKIE is a 70 y.o. female with a history of seizures who is undergoing an EEG to evaluate for seizures. Report: This EEG was acquired with electrodes placed according to the International 10-20 electrode system (including Fp1, Fp2, F3, F4, C3, C4, P3, P4, O1, O2, T3, T4, T5, T6, A1, A2, Fz, Cz, Pz). The following electrodes were missing or displaced: none. The occipital dominant rhythm was 8.5 Hz. This activity is reactive to stimulation. Drowsiness was manifested by background fragmentation; deeper stages of sleep were  not identified. There was focal slowing over the left temporal region. There were no interictal epileptiform discharges. There were no electrographic seizures identified. There was no abnormal response to photic stimulation. Hyperventilation was not performed. Impression and clinical correlation: This EEG was obtained while awake and drowsy and is abnormal due to focal slowing over the left temporal region indicating focal cerebral dysfunction in that area.   No electrographic seizures were observed during this recording. Su Monks, MD [facility] [phone] If 7pm- 7am, please page neurology on call as listed in [place].        Scheduled Meds:  allopurinol  300 mg Oral Daily   amLODipine  10 mg Oral Daily   aspirin EC  81 mg Oral Daily   atorvastatin  10 mg Oral Daily   dapagliflozin propanediol  10 mg Oral Daily   ezetimibe  10 mg Oral Daily   heparin  5,000 Units Subcutaneous Q8H   insulin aspart  0-15 Units Subcutaneous Q4H   insulin detemir  30 Units  Subcutaneous BID   levothyroxine  50 mcg Oral Q0600   linaclotide  145 mcg Oral Daily   losartan  25 mg Oral Daily   mometasone-formoterol  2 puff Inhalation BID   pantoprazole  40 mg Oral Daily   sodium bicarbonate  650 mg Oral TID   Continuous Infusions:   LOS: 1 day    Time spent: 32 minutes, more than 50% time involved in direct patient care.    Sharen Hones, MD [facility]   To contact the attending provider between 7A-7P or the covering provider during after hours 7P-7A, please log into the web site [email or web address] and access using universal [place] password for that web site. If you do not have the password, please call the hospital operator.  09/18/2021, 10:48 AM

## 2021-09-18 NOTE — Progress Notes (Signed)
IVC paperwork received from the Magistrate and placed on patient's chart.

## 2021-09-18 NOTE — Progress Notes (Signed)
Patient very agitated and attempting to leave unit. Patient oriented only to self consistently. Patient is oriented to place at times, but this fluctuates rapidly. Patient displays rapid speech with flight of ideas. Security called to floor to assist with patient return to room.   Patient is slow to be redirected, but redirection is ultimately successful. Patient did require prn IM haldol to help lessen agitation.   Safety sitter at bedside. Patient's sister [name] arrived around 0945. Sister able to provide a great distraction for patient.   Both attending and psych providers made aware.

## 2021-09-18 NOTE — Consult Note (Signed)
[facility] Consult   Reason for Consult: Consult for 70 year old woman with multiple medical problems who presents to the hospital with altered mental status.  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 female patient admitted with "get on out of here".  HPI: Patient seen chart reviewed.  70 year old woman who does not have a definite past psychiatric history brought to the hospital with altered mental status.  Patient has been having confusion and was combative in the emergency room.  Patient has multiple chronic severe medical problems including hypothyroidism and anemia history of past stroke and aneurysm coronary artery disease diabetes.  Multiple abnormal labs on presentation.  Came to see the patient today and found her awake but slid down in a chair.  Came in and introduced myself and the patient immediately became belligerent.  I ask her 1 question and she began shouting at me to get out of her room and that I was not going to do this to her.  Seemed very paranoid.  Patient also was indicating a desire to leave the hospital and had to be redirected by sitter.  No evidence of attempts to harm herself.  Tried to engage her in further conversation without success  Past Psychiatric History: No evidence known of any past psychiatric history.  No known or documented mood  disorder or substance abuse disorder or psychosis  Risk to Self:   Risk to Others:   Prior Inpatient Therapy:   Prior Outpatient Therapy:    Past Medical History:  Past Medical History:  Diagnosis Date   Anemia    Aneurysm (HCC)    Arthritis    RHEUMATOID ARTHRITIS   Asthma    Collagen vascular disease (HCC)    COPD (chronic obstructive pulmonary disease) (HCC)    Coronary artery disease    Diabetes mellitus without complication (HCC)    Edema    FEET/LEGS   GERD (gastroesophageal reflux disease)    Gout    H/O wheezing    History of hiatal hernia    Hypertension    Hypothyroidism    Neuropathy    Oxygen deficiency    HS   Peripheral vascular disease (HCC)    Sarcoidosis of lung (HCC)    Seizures ([place])    WITH BRAIN 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    Sleep apnea    OXYGEN AT NIGHT 3 L [place]    Past Surgical History:  Procedure Laterality Date   BRAIN SURGERY  1990's   CATARACT EXTRACTION W/PHACO Left 06/11/2016   Procedure: CATARACT EXTRACTION PHACO AND INTRAOCULAR LENS PLACEMENT ([place]);  Surgeon: Birder Robson, MD;  Location: [facility] ORS;  Service: Ophthalmology;  Laterality: Left;  [place] 1.01 AP% 21.6 CDE 13.23 Fluid pack lot # [id number]   CEREBRAL ANEURYSM REPAIR  1990's   COILS   CORONARY ARTERY BYPASS GRAFT     EYE SURGERY  2000's   bilateral cataract  FRACTURE SURGERY     HERNIA REPAIR  2000   ventral   STENT PLACEMENT VASCULAR ([place] HX)     VEIN BYPASS SURGERY     VENTRAL HERNIA REPAIR N/A 04/29/2016   Procedure: Laparoscopic HERNIA REPAIR VENTRAL ADULT;  Surgeon: Jules Husbands, MD;  Location: [facility] ORS;  Service: General;  Laterality: N/A;   Family History:  Family History  Problem Relation Age of Onset   Heart disease Mother    Hypertension Mother    Diabetes Mother    Diabetes Father    Heart disease Father    Hypertension Father    Breast cancer Maternal Aunt    Family Psychiatric  History: Unknown Social History:  Social History   Substance and  Sexual Activity  Alcohol Use No   Alcohol/week: 0.0 standard drinks     Social History   Substance and Sexual Activity  Drug Use No    Social History   Socioeconomic History   Marital status: Single    Spouse name: Not on file   Number of children: Not on file   Years of education: Not on file   Highest education level: Not on file  Occupational History   Not on file  Tobacco Use   Smoking status: Former    Packs/day: 1.00    Years: 30.00    Pack years: 30.00    Types: Cigarettes    Quit date: 01/27/2006    Years since quitting: 15.6   Smokeless tobacco: Never   Tobacco comments:    quit   Substance and Sexual Activity   Alcohol use: No    Alcohol/week: 0.0 standard drinks   Drug use: No   Sexual activity: Not on file  Other Topics Concern   Not on file  Social History Narrative   Not on file   Social Determinants of Health   Financial Resource Strain: Not on file  Food Insecurity: Not on file  Transportation Needs: Not on file  Physical Activity: Not on file  Stress: Not on file  Social Connections: Not on file   Additional Social History:    Allergies:   Allergies  Allergen Reactions   Oxycodone-Acetaminophen Other (See Comments)    Hallucinations Hallucinations HALLUCINATIONS Other reaction(s): Other (See Comments) HALLUCINATIONS Other reaction(s): Other (See Comments) Hallucinations Hallucinations HALLUCINATIONS Hallucinations Other reaction(s): Other (See Comments), Other (See Comments) HALLUCINATIONS Hallucinations Hallucinations HALLUCINATIONS Other reaction(s): Other (See Comments) HALLUCINATIONS Other reaction(s): Other (See Comments) Hallucinations Hallucinations HALLUCINATIONS Hallucinations Other reaction(s): Other (See Comments) HALLUCINATIONS Other reaction(s): Other (See Comments) Hallucinations Hallucinations HALLUCINATIONS Hallucinations    Indomethacin Other (See Comments)    BURN HOLE IN STOMACH BURN HOLE IN  STOMACH Other reaction(s): Other (See Comments), Other (See Comments) BURN HOLE IN STOMACH BURN HOLE IN STOMACH BURN HOLE IN STOMACH BURN HOLE IN STOMACH BURN HOLE IN STOMACH    Penicillins Rash    Labs:  Results for orders placed or performed during the hospital encounter of 09/16/21 (from the past 48 hour(s))  CBG monitoring, ED     Status: Abnormal   Collection Time: 09/16/21  2:56 PM  Result Value Ref Range   Glucose-Capillary >600 (HH) 70 - 99 mg/dL    Comment: Glucose reference range applies only to samples taken after fasting for at least 8 hours.  CBC     Status: Abnormal   Collection Time: 09/16/21  3:07 PM  Result Value Ref Range   WBC 2.3 (L) 4.0 - 10.5 K/uL   RBC 3.53 (  L) 3.87 - 5.11 MIL/uL   Hemoglobin 8.2 (L) 12.0 - 15.0 g/dL    Comment: Reticulocyte Hemoglobin testing may be clinically indicated, consider ordering this additional test [id number]    HCT 26.0 (L) 36.0 - 46.0 %   MCV 73.7 (L) 80.0 - 100.0 fL   MCH 23.2 (L) 26.0 - 34.0 pg   MCHC 31.5 30.0 - 36.0 g/dL   RDW 16.7 (H) 11.5 - 15.5 %   Platelets 117 (L) 150 - 400 K/uL   nRBC 0.0 0.0 - 0.2 %    Comment: Performed at [facility], [address] [place], [place] [address]  Differential     Status: Abnormal   Collection Time: 09/16/21  3:07 PM  Result Value Ref Range   Neutrophils Relative % 50 %   Neutro Abs 1.2 (L) 1.7 - 7.7 K/uL   Lymphocytes Relative 37 %   Lymphs Abs 0.8 0.7 - 4.0 K/uL   Monocytes Relative 8 %   Monocytes Absolute 0.2 0.1 - 1.0 K/uL   Eosinophils Relative 4 %   Eosinophils Absolute 0.1 0.0 - 0.5 K/uL   Basophils Relative 1 %   Basophils Absolute 0.0 0.0 - 0.1 K/uL   Immature Granulocytes 0 %   Abs Immature Granulocytes 0.00 0.00 - 0.07 K/uL    Comment: Performed at [facility], [address] [place], [place] [address]  Comprehensive metabolic panel     Status: Abnormal   Collection Time: 09/16/21  3:07 PM  Result Value Ref Range   Sodium 130 (L)  135 - 145 mmol/L   Potassium 4.8 3.5 - 5.1 mmol/L   Chloride 99 98 - 111 mmol/L   CO2 22 22 - 32 mmol/L   Glucose, Bld 647 (HH) 70 - 99 mg/dL    Comment: CRITICAL RESULT CALLED TO, READ BACK BY AND VERIFIED WITH KELLEY PENDLETON 09/16/21 1553 SJL Glucose reference range applies only to samples taken after fasting for at least 8 hours.    BUN 20 8 - 23 mg/dL   Creatinine, Ser 1.22 (H) 0.44 - 1.00 mg/dL   Calcium 8.7 (L) 8.9 - 10.3 mg/dL   Total Protein 6.9 6.5 - 8.1 g/dL   Albumin 3.5 3.5 - 5.0 g/dL   AST 21 15 - 41 U/L   ALT 15 0 - 44 U/L   Alkaline Phosphatase 170 (H) 38 - 126 U/L   Total Bilirubin 1.3 (H) 0.3 - 1.2 mg/dL   GFR, Estimated 47 (L) >60 mL/min    Comment: (NOTE) Calculated using the CKD-EPI Creatinine Equation (2021)    Anion gap 9 5 - 15    Comment: Performed at [facility], [address] [place], [place] [address]  Hemoglobin A1c     Status: Abnormal   Collection Time: 09/16/21  3:07 PM  Result Value Ref Range   Hgb A1c MFr Bld 12.8 (H) 4.8 - 5.6 %    Comment: (NOTE) Pre diabetes:          5.7%-6.4%  Diabetes:              >6.4%  Glycemic control for   <7.0% adults with diabetes    Mean Plasma Glucose 320.66 mg/dL    Comment: Performed at [facility], [place] [address], [place], [place] [address]  Blood gas, venous     Status: Abnormal   Collection Time: 09/16/21  4:14 PM  Result Value Ref Range   pH, Ven 7.21 (L) 7.250 - 7.430  pCO2, Ven 56 44.0 - 60.0 mmHg   pO2, Ven <31.0 (LL) 32.0 - 45.0 mmHg   Bicarbonate 22.4 20.0 - 28.0 mmol/L   Acid-base deficit 6.0 (H) 0.0 - 2.0 mmol/L   O2 Saturation 40.2 %   Patient temperature 37.0    Collection site VENOUS    Sample type VENOUS     Comment: Performed at [facility], [address] [place], [place] [address]  Lactic acid, plasma     Status: Abnormal   Collection Time: 09/16/21  4:18 PM  Result Value Ref Range   Lactic Acid, Venous 6.4 (HH) 0.5 - 1.9 mmol/L    Comment:  CRITICAL RESULT CALLED TO, READ BACK BY AND VERIFIED WITH ISSAC EVANS 09/16/21 1707 SJL Performed at [facility], [address] [place], [place] [address]   Ammonia     Status: None   Collection Time: 09/16/21  4:18 PM  Result Value Ref Range   Ammonia 33 9 - 35 umol/L    Comment: INTERPRET RESULTS WITH CAUTION DUE TO SLIGHT HEMOLYSIS Performed at [facility], [address] [place], [place] [address]   Ethanol     Status: None   Collection Time: 09/16/21  4:18 PM  Result Value Ref Range   Alcohol, Ethyl (B) <10 <10 mg/dL    Comment: (NOTE) Lowest detectable limit for serum alcohol is 10 mg/dL.  For medical purposes only. Performed at [facility], [address] [place], [place] [address]   TSH     Status: None   Collection Time: 09/16/21  4:18 PM  Result Value Ref Range   TSH 3.480 0.350 - 4.500 uIU/mL    Comment: Performed by a 3rd Generation assay with a functional sensitivity of <=0.01 uIU/mL. Performed at [facility], [address] [place], [place] [address]   Basic metabolic panel     Status: Abnormal   Collection Time: 09/16/21  4:18 PM  Result Value Ref Range   Sodium 135 135 - 145 mmol/L   Potassium 4.2 3.5 - 5.1 mmol/L    Comment: HEMOLYSIS AT THIS LEVEL MAY AFFECT RESULT   Chloride 104 98 - 111 mmol/L   CO2 18 (L) 22 - 32 mmol/L   Glucose, Bld 358 (H) 70 - 99 mg/dL    Comment: Glucose reference range applies only to samples taken after fasting for at least 8 hours.   BUN 18 8 - 23 mg/dL   Creatinine, Ser 1.44 (H) 0.44 - 1.00 mg/dL   Calcium 9.0 8.9 - 10.3 mg/dL   GFR, Estimated 39 (L) >60 mL/min    Comment: (NOTE) Calculated using the CKD-EPI Creatinine Equation (2021)    Anion gap 13 5 - 15    Comment: Performed at [facility], [address] [place], [place] [address]  Beta-hydroxybutyric acid     Status: Abnormal   Collection Time: 09/16/21  5:13 PM  Result Value Ref Range    Beta-Hydroxybutyric Acid 0.28 (H) 0.05 - 0.27 mmol/L    Comment: Performed at [facility], [address] [place], [place] [address]  Protime-INR     Status: None   Collection Time: 09/16/21  5:13 PM  Result Value Ref Range   Prothrombin Time 14.2 11.4 - 15.2 seconds   INR 1.1 0.8 - 1.2    Comment: (NOTE) INR goal varies based on device and disease states. Performed at [facility], [address] [place], [place] [address]   Resp Panel by  RT-PCR (Flu A&B, Covid) Urine, Catheterized     Status: None   Collection Time: 09/16/21  5:14 PM   Specimen: Urine, Catheterized; Nasopharyngeal(NP) swabs in vial transport medium  Result Value Ref Range   SARS Coronavirus 2 by RT PCR NEGATIVE NEGATIVE    Comment: (NOTE) SARS-CoV-2 target nucleic acids are NOT DETECTED.  The SARS-CoV-2 RNA is generally detectable in upper respiratory specimens during the acute phase of infection. The lowest concentration of SARS-CoV-2 viral copies this assay can detect is 138 copies/mL. A negative result does not preclude SARS-Cov-2 infection and should not be used as the sole basis for treatment or other patient management decisions. A negative result may occur with  improper specimen collection/handling, submission of specimen other than nasopharyngeal swab, presence of viral mutation(s) within the areas targeted by this assay, and inadequate number of viral copies(<138 copies/mL). A negative result must be combined with clinical observations, patient history, and epidemiological information. The expected result is Negative.  Fact Sheet for Patients:  EntrepreneurPulse.com.au  Fact Sheet for Healthcare Providers:  IncredibleEmployment.be  This test is no t yet approved or cleared by the [place] FDA and  has been authorized for detection and/or diagnosis of SARS-CoV-2 by FDA under an Emergency Use Authorization (EUA). This EUA will remain   in effect (meaning this test can be used) for the duration of the COVID-19 declaration under Section 564(b)(1) of the Act, 21 U.S.C.section 360bbb-3(b)(1), unless the authorization is terminated  or revoked sooner.       Influenza A by PCR NEGATIVE NEGATIVE   Influenza B by PCR NEGATIVE NEGATIVE    Comment: (NOTE) The Xpert Xpress SARS-CoV-2/FLU/RSV plus assay is intended as an aid in the diagnosis of influenza from Nasopharyngeal swab specimens and should not be used as a sole basis for treatment. Nasal washings and aspirates are unacceptable for Xpert Xpress SARS-CoV-2/FLU/RSV testing.  Fact Sheet for Patients: EntrepreneurPulse.com.au  Fact Sheet for Healthcare Providers: IncredibleEmployment.be  This test is not yet approved or cleared by the [place] FDA and has been authorized for detection and/or diagnosis of SARS-CoV-2 by FDA under an Emergency Use Authorization (EUA). This EUA will remain in effect (meaning this test can be used) for the duration of the COVID-19 declaration under Section 564(b)(1) of the Act, 21 U.S.C. section 360bbb-3(b)(1), unless the authorization is terminated or revoked.  Performed at [facility], [address] [place], [place] [address]   Urinalysis, Complete w Microscopic Urine, Catheterized     Status: Abnormal   Collection Time: 09/16/21  5:14 PM  Result Value Ref Range   Color, Urine STRAW (A) YELLOW   APPearance CLEAR (A) CLEAR   Specific Gravity, Urine 1.023 1.005 - 1.030   pH 6.0 5.0 - 8.0   Glucose, UA >=500 (A) NEGATIVE mg/dL   Hgb urine dipstick NEGATIVE NEGATIVE   Bilirubin Urine NEGATIVE NEGATIVE   Ketones, ur NEGATIVE NEGATIVE mg/dL   Protein, ur NEGATIVE NEGATIVE mg/dL   Nitrite NEGATIVE NEGATIVE   Leukocytes,Ua NEGATIVE NEGATIVE   RBC / HPF 0-5 0 - 5 RBC/hpf   WBC, UA 0-5 0 - 5 WBC/hpf   Bacteria, UA NONE SEEN NONE SEEN   Squamous Epithelial / LPF 0-5 0 - 5     Comment: Performed at [facility], [address], [place], [place] [address]  Urine Drug Screen, Qualitative ([facility] only)     Status: None   Collection Time: 09/16/21  5:14 PM  Result Value Ref Range   Tricyclic,  Ur Screen NONE DETECTED NONE DETECTED   Amphetamines, Ur Screen NONE DETECTED NONE DETECTED   MDMA (Ecstasy)Ur Screen NONE DETECTED NONE DETECTED   Cocaine Metabolite,Ur [place] NONE DETECTED NONE DETECTED   Opiate, Ur Screen NONE DETECTED NONE DETECTED   Phencyclidine (PCP) Ur S NONE DETECTED NONE DETECTED   Cannabinoid 50 Ng, Ur [place] NONE DETECTED NONE DETECTED   Barbiturates, Ur Screen NONE DETECTED NONE DETECTED   Benzodiazepine, Ur Scrn NONE DETECTED NONE DETECTED   Methadone Scn, Ur NONE DETECTED NONE DETECTED    Comment: (NOTE) Tricyclics + metabolites, urine    Cutoff 1000 ng/mL Amphetamines + metabolites, urine  Cutoff 1000 ng/mL MDMA (Ecstasy), urine              Cutoff 500 ng/mL Cocaine Metabolite, urine          Cutoff 300 ng/mL Opiate + metabolites, urine        Cutoff 300 ng/mL Phencyclidine (PCP), urine         Cutoff 25 ng/mL Cannabinoid, urine                 Cutoff 50 ng/mL Barbiturates + metabolites, urine  Cutoff 200 ng/mL Benzodiazepine, urine              Cutoff 200 ng/mL Methadone, urine                   Cutoff 300 ng/mL  The urine drug screen provides only a preliminary, unconfirmed analytical test result and should not be used for non-medical purposes. Clinical consideration and professional judgment should be applied to any positive drug screen result due to possible interfering substances. A more specific alternate chemical method must be used in order to obtain a confirmed analytical result. Gas chromatography / mass spectrometry (GC/MS) is the preferred confirm atory method. Performed at [facility], [address], [place], [place] [address]   Urine Culture     Status: None   Collection Time: 09/16/21  5:14 PM   Specimen:  Urine, Random  Result Value Ref Range   Specimen Description      URINE, RANDOM Performed at [facility], [address] [place], [place] [address]    Special Requests      NONE Performed at [facility], [address], [place], [place] [address]    Culture      NO GROWTH Performed at [facility], [place] [address] [place], [place] [address]    Report Status 09/17/2021 FINAL   CBG monitoring, ED     Status: Abnormal   Collection Time: 09/16/21  5:21 PM  Result Value Ref Range   Glucose-Capillary 365 (H) 70 - 99 mg/dL    Comment: Glucose reference range applies only to samples taken after fasting for at least 8 hours.  Blood culture (routine single)     Status: None (Preliminary result)   Collection Time: 09/16/21  5:36 PM   Specimen: BLOOD  Result Value Ref Range   Specimen Description BLOOD RIGHT ANTECUBITAL    Special Requests      BOTTLES DRAWN AEROBIC AND ANAEROBIC Blood Culture adequate volume   Culture      NO GROWTH 2 DAYS Performed at [facility], [address] [place], [place] [address]    Report Status PENDING   APTT     Status: None   Collection Time: 09/16/21  5:37 PM  Result Value Ref Range   aPTT 28 24 - 36 seconds  Comment: Performed at [facility], [address] [place], [place] [address]  Type and screen [place]     Status: None   Collection Time: 09/16/21  5:37 PM  Result Value Ref Range   ABO/RH(D) O POS    Antibody Screen NEG    Sample Expiration      09/19/2021,2359 Performed at [facility], [address] [place], [place] [address]   Osmolality     Status: Abnormal   Collection Time: 09/16/21  5:37 PM  Result Value Ref Range   Osmolality 301 (H) 275 - 295 mOsm/kg    Comment: REPEATED TO VERIFY Performed at [facility], [address] [place], [place] [address]   CBG monitoring, ED     Status: Abnormal   Collection Time: 09/16/21  6:00 PM   Result Value Ref Range   Glucose-Capillary 327 (H) 70 - 99 mg/dL    Comment: Glucose reference range applies only to samples taken after fasting for at least 8 hours.  Blood gas, venous     Status: Abnormal (Preliminary result)   Collection Time: 09/16/21  6:17 PM  Result Value Ref Range   pH, Ven 7.34 7.250 - 7.430   pCO2, Ven 43 (L) 44.0 - 60.0 mmHg   pO2, Ven PENDING 32.0 - 45.0 mmHg   Bicarbonate 23.2 20.0 - 28.0 mmol/L   Acid-base deficit 2.5 (H) 0.0 - 2.0 mmol/L   O2 Saturation 52.6 %   Patient temperature 37.0    Collection site VEIN    Sample type VEIN     Comment: Performed at [facility], [place], [place] [address]  Troponin I (High Sensitivity)     Status: Abnormal   Collection Time: 09/16/21  6:25 PM  Result Value Ref Range   Troponin I (High Sensitivity) 56 (H) <18 ng/L    Comment: (NOTE) Elevated high sensitivity troponin I (hsTnI) values and significant  changes across serial measurements may suggest ACS but many other  chronic and acute conditions are known to elevate hsTnI results.  Refer to the "Links" section for chest pain algorithms and additional  guidance. Performed at [facility], [address] [place], [place] [address]   Procalcitonin - Baseline     Status: None   Collection Time: 09/16/21  6:25 PM  Result Value Ref Range   Procalcitonin <0.10 ng/mL    Comment:        Interpretation: PCT (Procalcitonin) <= 0.5 ng/mL: Systemic infection (sepsis) is not likely. Local bacterial infection is possible. (NOTE)       Sepsis PCT Algorithm           Lower Respiratory Tract                                      Infection PCT Algorithm    ----------------------------     ----------------------------         PCT < 0.25 ng/mL                PCT < 0.10 ng/mL          Strongly encourage             Strongly discourage   discontinuation of antibiotics    initiation of antibiotics    ----------------------------      -----------------------------       PCT 0.25 - 0.50 ng/mL  PCT 0.10 - 0.25 ng/mL               OR       >80% decrease in PCT            Discourage initiation of                                            antibiotics      Encourage discontinuation           of antibiotics    ----------------------------     -----------------------------         PCT >= 0.50 ng/mL              PCT 0.26 - 0.50 ng/mL               AND        <80% decrease in PCT             Encourage initiation of                                             antibiotics       Encourage continuation           of antibiotics    ----------------------------     -----------------------------        PCT >= 0.50 ng/mL                  PCT > 0.50 ng/mL               AND         increase in PCT                  Strongly encourage                                      initiation of antibiotics    Strongly encourage escalation           of antibiotics                                     -----------------------------                                           PCT <= 0.25 ng/mL                                                 OR                                        > 80% decrease in PCT  Discontinue / Do not initiate                                             antibiotics  Performed at [facility], [address] [place], [place] [address]   CBG monitoring, ED     Status: Abnormal   Collection Time: 09/16/21  7:21 PM  Result Value Ref Range   Glucose-Capillary 284 (H) 70 - 99 mg/dL    Comment: Glucose reference range applies only to samples taken after fasting for at least 8 hours.   Comment 1 Notify RN    Comment 2 Document in Chart   CBG monitoring, ED     Status: Abnormal   Collection Time: 09/16/21 11:18 PM  Result Value Ref Range   Glucose-Capillary 162 (H) 70 - 99 mg/dL    Comment: Glucose reference range applies only to samples taken after fasting for at least 8  hours.  CBG monitoring, ED     Status: Abnormal   Collection Time: 09/17/21  3:28 AM  Result Value Ref Range   Glucose-Capillary 167 (H) 70 - 99 mg/dL    Comment: Glucose reference range applies only to samples taken after fasting for at least 8 hours.  CBG monitoring, ED     Status: Abnormal   Collection Time: 09/17/21  8:52 AM  Result Value Ref Range   Glucose-Capillary 250 (H) 70 - 99 mg/dL    Comment: Glucose reference range applies only to samples taken after fasting for at least 8 hours.  Lactic acid, plasma     Status: None   Collection Time: 09/17/21  9:17 AM  Result Value Ref Range   Lactic Acid, Venous 1.4 0.5 - 1.9 mmol/L    Comment: Performed at [facility], [address] [place], [place] [address]  CBC with Differential/Platelet     Status: Abnormal   Collection Time: 09/17/21  9:17 AM  Result Value Ref Range   WBC 2.9 (L) 4.0 - 10.5 K/uL   RBC 4.91 3.87 - 5.11 MIL/uL   Hemoglobin 11.5 (L) 12.0 - 15.0 g/dL    Comment: REPEATED TO VERIFY   HCT 35.6 (L) 36.0 - 46.0 %   MCV 72.5 (L) 80.0 - 100.0 fL   MCH 23.4 (L) 26.0 - 34.0 pg   MCHC 32.3 30.0 - 36.0 g/dL   RDW 16.9 (H) 11.5 - 15.5 %   Platelets 159 150 - 400 K/uL   nRBC 0.0 0.0 - 0.2 %   Neutrophils Relative % 47 %   Neutro Abs 1.4 (L) 1.7 - 7.7 K/uL   Lymphocytes Relative 37 %   Lymphs Abs 1.1 0.7 - 4.0 K/uL   Monocytes Relative 9 %   Monocytes Absolute 0.3 0.1 - 1.0 K/uL   Eosinophils Relative 6 %   Eosinophils Absolute 0.2 0.0 - 0.5 K/uL   Basophils Relative 1 %   Basophils Absolute 0.0 0.0 - 0.1 K/uL   Immature Granulocytes 0 %   Abs Immature Granulocytes 0.00 0.00 - 0.07 K/uL    Comment: Performed at [facility], [address], [place], [place] [address]  Basic metabolic panel     Status: Abnormal   Collection Time: 09/17/21  9:17 AM  Result Value Ref Range   Sodium 137 135 - 145 mmol/L   Potassium 3.7 3.5 - 5.1 mmol/L   Chloride 107  98 - 111 mmol/L   CO2 23 22 - 32 mmol/L    Glucose, Bld 260 (H) 70 - 99 mg/dL    Comment: Glucose reference range applies only to samples taken after fasting for at least 8 hours.   BUN 9 8 - 23 mg/dL   Creatinine, Ser 0.92 0.44 - 1.00 mg/dL   Calcium 8.4 (L) 8.9 - 10.3 mg/dL   GFR, Estimated >60 >60 mL/min    Comment: (NOTE) Calculated using the CKD-EPI Creatinine Equation (2021)    Anion gap 7 5 - 15    Comment: Performed at [facility], [address] [place], [place] [address]  Magnesium     Status: Abnormal   Collection Time: 09/17/21  9:17 AM  Result Value Ref Range   Magnesium 1.6 (L) 1.7 - 2.4 mg/dL    Comment: Performed at [facility], [address], [place], [place] [address]  Phosphorus     Status: Abnormal   Collection Time: 09/17/21  9:17 AM  Result Value Ref Range   Phosphorus 2.4 (L) 2.5 - 4.6 mg/dL    Comment: Performed at [facility], [address] [place], [place] [address]  CBG monitoring, ED     Status: Abnormal   Collection Time: 09/17/21  1:49 PM  Result Value Ref Range   Glucose-Capillary 329 (H) 70 - 99 mg/dL    Comment: Glucose reference range applies only to samples taken after fasting for at least 8 hours.  Lactic acid, plasma     Status: Abnormal   Collection Time: 09/17/21  2:46 PM  Result Value Ref Range   Lactic Acid, Venous 2.2 (HH) 0.5 - 1.9 mmol/L    Comment: CRITICAL VALUE NOTED. VALUE IS CONSISTENT WITH PREVIOUSLY REPORTED/CALLED VALUE MU Performed at [facility], [address] [place], [place] [address]   CBG monitoring, ED     Status: Abnormal   Collection Time: 09/17/21  4:56 PM  Result Value Ref Range   Glucose-Capillary 298 (H) 70 - 99 mg/dL    Comment: Glucose reference range applies only to samples taken after fasting for at least 8 hours.  CBG monitoring, ED     Status: Abnormal   Collection Time: 09/17/21  7:58 PM  Result Value Ref Range   Glucose-Capillary 286 (H) 70 - 99 mg/dL    Comment: Glucose reference range applies  only to samples taken after fasting for at least 8 hours.   Comment 1 Document in Chart   Basic metabolic panel     Status: Abnormal   Collection Time: 09/18/21  4:41 AM  Result Value Ref Range   Sodium 135 135 - 145 mmol/L   Potassium 4.4 3.5 - 5.1 mmol/L    Comment: HEMOLYSIS AT THIS LEVEL MAY AFFECT RESULT   Chloride 103 98 - 111 mmol/L   CO2 24 22 - 32 mmol/L   Glucose, Bld 341 (H) 70 - 99 mg/dL    Comment: Glucose reference range applies only to samples taken after fasting for at least 8 hours.   BUN 10 8 - 23 mg/dL   Creatinine, Ser 1.10 (H) 0.44 - 1.00 mg/dL   Calcium 8.9 8.9 - 10.3 mg/dL   GFR, Estimated 54 (L) >60 mL/min    Comment: (NOTE) Calculated using the CKD-EPI Creatinine Equation (2021)    Anion gap 8 5 - 15    Comment: Performed at [facility], [address] [place],  [address]  Magnesium  Status: None   Collection Time: 09/18/21  4:41 AM  Result Value Ref Range   Magnesium 1.9 1.7 - 2.4 mg/dL    Comment: Performed at [facility], [address] [place], [place] [address]  Phosphorus     Status: None   Collection Time: 09/18/21  4:41 AM  Result Value Ref Range   Phosphorus 3.2 2.5 - 4.6 mg/dL    Comment: Performed at [facility], [address] [place], [place] [address]  Glucose, capillary     Status: Abnormal   Collection Time: 09/18/21  4:49 AM  Result Value Ref Range   Glucose-Capillary 307 (H) 70 - 99 mg/dL    Comment: Glucose reference range applies only to samples taken after fasting for at least 8 hours.  Glucose, capillary     Status: Abnormal   Collection Time: 09/18/21 11:46 AM  Result Value Ref Range   Glucose-Capillary 290 (H) 70 - 99 mg/dL    Comment: Glucose reference range applies only to samples taken after fasting for at least 8 hours.   Comment 1 Notify RN     Current Facility-Administered Medications  Medication Dose Route Frequency Provider Last Rate Last Admin   albuterol (PROVENTIL)  (2.5 MG/3ML) 0.083% nebulizer solution 3 mL  3 mL Inhalation Q6H PRN Sharen Hones, MD       allopurinol (ZYLOPRIM) tablet 300 mg  300 mg Oral Daily Sharen Hones, MD       amLODipine (NORVASC) tablet 10 mg  10 mg Oral Daily Sharen Hones, MD   10 mg at 09/17/21 1405   aspirin EC tablet 81 mg  81 mg Oral Daily Sharen Hones, MD   81 mg at 09/17/21 1405   atorvastatin (LIPITOR) tablet 10 mg  10 mg Oral Daily Sharen Hones, MD       dapagliflozin propanediol (FARXIGA) tablet 10 mg  10 mg Oral Daily Sharen Hones, MD       dextrose 50 % solution 0-50 mL  0-50 mL Intravenous PRN Para Skeans, MD       ezetimibe (ZETIA) tablet 10 mg  10 mg Oral Daily Sharen Hones, MD       haloperidol lactate (HALDOL) injection 5 mg  5 mg Intramuscular Q6H PRN Sharen Hones, MD       heparin injection 5,000 Units  5,000 Units Subcutaneous Q8H Renda Rolls, RPH   5,000 Units at 09/18/21 0503   insulin aspart (novoLOG) injection 0-15 Units  0-15 Units Subcutaneous Q4H Para Skeans, MD   8 Units at 09/18/21 1248   insulin detemir (LEVEMIR) injection 30 Units  30 Units Subcutaneous BID Sharen Hones, MD   30 Units at 09/18/21 0833   levothyroxine (SYNTHROID) tablet 50 mcg  50 mcg Oral Q0600 Sharen Hones, MD   50 mcg at 09/18/21 0502   linaclotide (LINZESS) capsule 145 mcg  145 mcg Oral Daily Sharen Hones, MD       LORazepam (ATIVAN) injection 0.5 mg  0.5 mg Intravenous Q4H PRN Mansy, Jan A, MD   0.5 mg at 09/18/21 1359   losartan (COZAAR) tablet 25 mg  25 mg Oral Daily Sharen Hones, MD   25 mg at 09/17/21 1405   mometasone-formoterol (DULERA) 200-5 MCG/ACT inhaler 2 puff  2 puff Inhalation BID Sharen Hones, MD   2 puff at 09/18/21 0854   pantoprazole (PROTONIX) EC tablet 40 mg  40 mg Oral Daily Sharen Hones, MD   40 mg at 09/17/21 1405   QUEtiapine (  SEROQUEL) tablet 50 mg  50 mg Oral QHS Sharen Hones, MD       traZODone (DESYREL) tablet 25 mg  25 mg Oral QHS PRN Mansy, Arvella Merles, MD        Musculoskeletal: Strength & Muscle  Tone: within normal limits Gait & Station: unsteady Patient leans: N/A            Psychiatric Specialty Exam:  Presentation  General Appearance: No data recorded Eye Contact:No data recorded Speech:No data recorded Speech Volume:No data recorded Handedness:No data recorded  Mood and Affect  Mood:No data recorded Affect:No data recorded  Thought Process  Thought Processes:No data recorded Descriptions of Associations:No data recorded Orientation:No data recorded Thought Content:No data recorded History of Schizophrenia/Schizoaffective disorder:No data recorded Duration of Psychotic Symptoms:No data recorded Hallucinations:No data recorded Ideas of Reference:No data recorded Suicidal Thoughts:No data recorded Homicidal Thoughts:No data recorded  Sensorium  Memory:No data recorded Judgment:No data recorded Insight:No data recorded  Executive Functions  Concentration:No data recorded Attention Span:No data recorded Recall:No data recorded Fund of Knowledge:No data recorded Language:No data recorded  Psychomotor Activity  Psychomotor Activity:No data recorded  Assets  Assets:No data recorded  Sleep  Sleep:No data recorded  Physical Exam: Physical Exam Vitals and nursing note reviewed.  Constitutional:      Appearance: Normal appearance.  HENT:     Head: Normocephalic and atraumatic.     Mouth/Throat:     Pharynx: Oropharynx is clear.  Eyes:     Pupils: Pupils are equal, round, and reactive to light.  Cardiovascular:     Rate and Rhythm: Normal rate and regular rhythm.  Pulmonary:     Effort: Pulmonary effort is normal.     Breath sounds: Normal breath sounds.  Abdominal:     General: Abdomen is flat.     Palpations: Abdomen is soft.  Musculoskeletal:        General: Normal range of motion.  Skin:    General: Skin is warm and dry.  Neurological:     General: No focal deficit present.     Mental Status: She is alert. Mental status is at  baseline.  Psychiatric:        Attention and Perception: She is inattentive.        Mood and Affect: Mood normal. Affect is labile, angry and inappropriate.        Speech: Speech is tangential.        Behavior: Behavior is agitated.        Thought Content: Thought content is paranoid.        Cognition and Memory: Cognition is impaired.        Judgment: Judgment is inappropriate.   Review of Systems  Unable to perform ROS: Mental status change  Blood pressure (!) 150/65, pulse 68, temperature 98.1 F (36.7 C), temperature source Oral, resp. rate 18, height 5\' 3"  (1.6 m), weight 95.6 kg, SpO2 97 %. Body mass index is 37.34 kg/m.  Treatment Plan Summary: Plan based on interaction today and chart the patient is not oriented to her situation and not able to demonstrate any understanding of her medical condition or of the recommendations being made to her and therefore would not be considered to have the capacity to sign out [place].  In order to facilitate making sure the patient stays safe that is appropriate to file involuntary commitment papers.  Although an order had been placed on the chart for IVC paperwork was not in evidence.  I am going  to put in involuntary commitment paperwork.  Patient already has orders for as needed Haldol which should be adequate for control of any behavior problems.  Reconsult psych if needed.  If needed at some point to discontinue IVC let us know as well.  Disposition:  See note  Alethia Berthold, MD 09/18/2021 2:25 PM

## 2021-09-19 ENCOUNTER — Inpatient Hospital Stay: Payer: Medicare Other

## 2021-09-19 DIAGNOSIS — D860 Sarcoidosis of lung: Secondary | ICD-10-CM | POA: Diagnosis not present

## 2021-09-19 DIAGNOSIS — E1100 Type 2 diabetes mellitus with hyperosmolarity without nonketotic hyperglycemic-hyperosmolar coma (NKHHC): Principal | ICD-10-CM

## 2021-09-19 LAB — BASIC METABOLIC PANEL WITH GFR
Anion gap: 7 (ref 5–15)
BUN: 13 mg/dL (ref 8–23)
CO2: 24 mmol/L (ref 22–32)
Calcium: 8.8 mg/dL — ABNORMAL LOW (ref 8.9–10.3)
Chloride: 105 mmol/L (ref 98–111)
Creatinine, Ser: 1.05 mg/dL — ABNORMAL HIGH (ref 0.44–1.00)
GFR, Estimated: 57 mL/min — ABNORMAL LOW (ref >60)
Glucose, Bld: 202 mg/dL — ABNORMAL HIGH (ref 70–99)
Potassium: 3.8 mmol/L (ref 3.5–5.1)
Sodium: 136 mmol/L (ref 135–145)

## 2021-09-19 LAB — GLUCOSE, CAPILLARY
Glucose-Capillary: 172 mg/dL — ABNORMAL HIGH (ref 70–99)
Glucose-Capillary: 169 mg/dL — ABNORMAL HIGH (ref 70–99)
Glucose-Capillary: 103 mg/dL — ABNORMAL HIGH (ref 70–99)
Glucose-Capillary: 222 mg/dL — ABNORMAL HIGH (ref 70–99)

## 2021-09-19 LAB — MAGNESIUM: Magnesium: 1.7 mg/dL (ref 1.7–2.4)

## 2021-09-19 MED ORDER — GADOBUTROL 1 MMOL/ML IV SOLN
9.0000 mL | Freq: Once | INTRAVENOUS | Status: AC | PRN
  Administered 2021-09-19: 14:00:00 9 mL via INTRAVENOUS

## 2021-09-19 MED ORDER — ENOXAPARIN SODIUM 60 MG/0.6ML IJ SOSY
0.5000 mg/kg | PREFILLED_SYRINGE | INTRAMUSCULAR | Status: DC
  Administered 2021-09-19 – 2021-09-25 (×7): 47.5 mg via SUBCUTANEOUS
  Filled 2021-09-19 (×7): qty 0.6

## 2021-09-19 MED ORDER — INSULIN DETEMIR 100 UNIT/ML ~~LOC~~ SOLN
26.0000 [IU] | Freq: Two times a day (BID) | SUBCUTANEOUS | Status: DC
Start: 2021-09-19 — End: 2021-09-22
  Administered 2021-09-19 – 2021-09-21 (×5): 26 [IU] via SUBCUTANEOUS
  Filled 2021-09-19 (×6): qty 0.26

## 2021-09-19 NOTE — Plan of Care (Signed)
Patient oriented to self, cooperative with care, remains on RA. Took all scheduled medications overnight except heparin, citing "too many shots" as it was scheduled with 2 insulin administrations. Resting comfortably with adequate chest rise and fall after scheduled seroquel, VSS. MRI not completed overnight, still pending. 1:1 sitter for IVC order in place, fall/safety precautions maintained, rounding performed, needs/concerns addressed during shift.   Problem: Safety: Goal: Violent Restraint(s) Outcome: Not Applicable   Problem: Education: Goal: Knowledge of General Education information will improve Description: Including pain rating scale, medication(s)/side effects and non-pharmacologic comfort measures Outcome: Progressing   Problem: Health Behavior/Discharge Planning: Goal: Ability to manage health-related needs will improve Outcome: Progressing   Problem: Clinical Measurements: Goal: Ability to maintain clinical measurements within normal limits will improve Outcome: Progressing Goal: Will remain free from infection Outcome: Progressing Goal: Diagnostic test results will improve Outcome: Progressing Goal: Respiratory complications will improve Outcome: Progressing Goal: Cardiovascular complication will be avoided Outcome: Progressing   Problem: Activity: Goal: Risk for activity intolerance will decrease Outcome: Progressing   Problem: Nutrition: Goal: Adequate nutrition will be maintained Outcome: Progressing   Problem: Coping: Goal: Level of anxiety will decrease Outcome: Progressing   Problem: Elimination: Goal: Will not experience complications related to bowel motility Outcome: Progressing Goal: Will not experience complications related to urinary retention Outcome: Progressing   Problem: Pain Managment: Goal: General experience of comfort will improve Outcome: Progressing   Problem: Safety: Goal: Ability to remain free from injury will improve Outcome:  Progressing   Problem: Skin Integrity: Goal: Risk for impaired skin integrity will decrease Outcome: Progressing

## 2021-09-19 NOTE — TOC Initial Note (Signed)
Transition of Care [facility]) - Initial/Assessment Note    Patient Details  Name: Chelsey [name] MRN: [id number] Date of Birth: 1951-11-04  [facility] CM/SW Contact:    Shelbie Hutching, RN Phone Number: 09/19/2021, 1:43 PM  Clinical Narrative:                 Patient admitted to the hospital with altered mental status.  Patient is currently IVC'd as psych says that she lacks capacity to decide to leave.  RNCM met with patient at the bedside, patient's sister [name] is sitting with patient at the bedside and patient has a Air cabin crew.  Patient lives alone in an apartment in [place], [place].  Patient is independent in ADL's but has been displaying behavior changes per her sister.  TOC will follow for disposition assistance.    Expected Discharge Plan: Home/Self Care Barriers to Discharge: Continued Medical Work up   Patient Goals and CMS Choice Patient states their goals for this hospitalization and ongoing recovery are:: Patient wants to go home      Expected Discharge Plan and Services Expected Discharge Plan: Home/Self Care   Discharge Planning Services: CM Consult                     DME Arranged: N/A DME Agency: NA                  Prior Living Arrangements/Services   Lives with:: Self Patient language and need for interpreter reviewed:: Yes Do you feel safe going back to the place where you live?: Yes      Need for Family Participation in Patient Care: Yes (Comment) Care giver support system in place?: Yes (comment) Current home services: DME (walker if needed) Criminal Activity/Legal Involvement Pertinent to Current Situation/Hospitalization: No - Comment as needed  Activities of Daily Living      Permission Sought/Granted Permission sought to share information with : Case Manager, Family Supports Permission granted to share information with : Yes, Verbal Permission Granted  Share Information with NAME: Nellie Sherral Hammers     Permission  granted to share info w Relationship: sister  Permission granted to share info w Contact Information: [phone]  Emotional Assessment Appearance:: Appears stated age Attitude/Demeanor/Rapport: Complaining, Angry Affect (typically observed): Agitated, Angry Orientation: : Oriented to Self Alcohol / Substance Use: Not Applicable Psych Involvement: Yes (comment)  Admission diagnosis:  Hyperglycemia [R73.9] Altered mental status, unspecified altered mental status type [D74.12] Acute metabolic encephalopathy [I78.67] AMS (altered mental status) [R41.82] Patient Active Problem List   Diagnosis Date Noted   Uncontrolled type 2 diabetes mellitus with hyperglycemia ([place]) 67/20/9470   Acute metabolic encephalopathy 96/28/3662   AMS (altered mental status) 09/16/2021   Morbid obesity ([place]) 05/28/2021   Hyperparathyroidism due to renal insufficiency ([place]) 04/16/2020   Stage 3b chronic kidney disease ([place]) 04/16/2020   Complex renal cyst 10/18/2019   Urinary tract infection without hematuria 08/02/2019   Dysuria 08/02/2019   Essential hypertension, benign 08/02/2019   Right upper quadrant pain 12/07/2017   Iron deficiency anemia secondary to blood loss (chronic) 10/06/2017   Pain in right finger(s) 07/07/2017   Chronic pain of left wrist 03/04/2017   Pain in wrist 03/04/2017   Chronic pain of left ankle 11/03/2016   Chronic pain of left knee 11/03/2016   Right renal mass 11/03/2016   Hyperlipemia 07/29/2016   Recurrent ventral hernia    Ventral hernia without obstruction or gangrene 03/25/2016   Type 2 diab  w hyprosm w/o nonket hyprgly-hypros coma [facility]) ([place])    Foot pain 12/10/2015   Chronic gouty arthropathy without tophi 12/10/2015   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/10/2015   Sarcoidosis of lung ([place]) 12/10/2015   Sarcoidosis, nodular type 12/10/2015   Chronic foot pain, left 12/10/2015   Congenital pes planus of left foot 12/10/2015   Chronic idiopathic gout of  multiple sites 08/05/2015   Sleep related hypoventilation/hypoxemia in other disease 04/29/2015   NAFLD (nonalcoholic fatty liver disease) 10/10/2014   Hypothyroidism due to acquired atrophy of thyroid 07/11/2014   Primary localized osteoarthrosis, lower leg 10/21/2012   Sarcoidosis 05/24/2008   Peripheral vascular disease ([place]) 02/15/2007   PCP:  Lavera Guise, MD Pharmacy:   [place], [place] - [place] [address] [place] [address] Phone: [phone] Fax: [phone]  [id number] [place], [place] - [address] [place] [address] [place] [address] Phone: [phone] Fax: [phone]     Social Determinants of Health (SDOH) Interventions    Readmission Risk Interventions Readmission Risk Prevention Plan 09/19/2021  Transportation Screening Complete  PCP or Specialist Appt within 3-5 Days Complete  HRI or Home Care Consult Complete  Social Work Consult for [place] Planning/Counseling Complete  Palliative Care Screening Not Applicable  Medication Review Press photographer) Complete  Some recent data might be hidden

## 2021-09-19 NOTE — Progress Notes (Signed)
PROGRESS NOTE    Chelsey [name]  ZMO:[id number] DOB: 1951-01-14 DOA: 09/16/2021 PCP: Lavera Guise, MD    Brief Narrative:   Chelsey [name] is a 70 year old female with history of COPD, on nocturnal oxygen with obstructive sleep apnea, type 2 diabetes, coronary disease, essential hypertension, hypothyroidism, peripheral vascular disease, pulmonary sarcoidosis, history of seizures with history of brain aneurysm, rheumatoid arthritis to the hospital with altered mental status. I have discussed with patient and her sister this morning, she has been compliant with her insulin for diabetes, she does not have any recent sickness, no nausea vomiting or diarrhea. Was very confused yesterday, agitated arriving the emergency room.  Was given sedatives.  She had severe hyperglycemia with glucose more than 600, lactic acidosis of 6.0.  She was given fluids, she also received a insulin drip.  She does not have anion gap, no DKA.  Assessment & Plan:   Principal Problem:   AMS (altered mental status) Active Problems:   Hypothyroidism due to acquired atrophy of thyroid   Sarcoidosis of lung ([place])   Type 2 diab w hyprosm w/o nonket hyprgly-hypros coma [facility]) ([place])   Stage 3b chronic kidney disease ([place])   Acute metabolic encephalopathy   Uncontrolled type 2 diabetes mellitus with hyperglycemia ([facility])  Acute encephalopathy.  Most likely metabolic due to severe hyperglycemia. Uncontrolled diabetes with diabetes hyperosmolality with altered mental status. Severe lactic acidosis. Elevated troponin secondary to diabetes hyperosmolality. Seizure disorder. 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 Patient glucose much better controlled. She slept better last night with the Seroquel, she has some confusion, but no agitation. MRI is pending, spoke with MRI tech, they will look into whether patient can have MRI due to her brain aneurysm clip.  Acute kidney injury. Hypomagnesemia Mild hypophosphatemia. Condition had  improved.  Sarcoidosis of the lungs. Follow-up with PCP as outpatient.  Obstruct sleep apnea. Continue nocturnal oxygen.   Leukopenia.  No neutropenia. Recheck a CBC tomorrow, respiratory viral panel negative.   DVT prophylaxis: SCDs Code Status: full Family Communication: sister updated Disposition Plan:      Status is: Inpatient due to severity of disease and altered mental status         I/O last 3 completed shifts: In: 720 [P.O.:720] Out: 1200 [Urine:1200] No intake/output data recorded.     Consultants:  neurology  Procedures: none  Antimicrobials: none   Subjective: Patient doing much better this morning.  She slept through the night, no agitation.  He has some confusion earlier this morning after he woke up. Denies any short of breath or cough. No abdominal pain nausea vomiting. No dysuria hematuria pain No fever or chills.  Objective: Vitals:   09/19/21 0403 09/19/21 0812 09/19/21 0921 09/19/21 1240  BP: 127/69 (!) 119/54 (!) 125/59 138/72  Pulse: 64 (!) 52 (!) 59 72  Resp: 16 18  16   Temp: 98 F (36.7 C) 97.8 F (36.6 C)  97.7 F (36.5 C)  TempSrc: Oral Oral  Oral  SpO2: 95% 95%  92%  Weight:      Height:       No intake or output data in the 24 hours ending 09/19/21 1310 Filed Weights   09/16/21 1454  Weight: 95.6 kg    Examination:  General exam: Appears calm and comfortable  Respiratory system: Clear to auscultation. Respiratory effort normal. Cardiovascular system: S1 & S2 heard, RRR. No JVD, murmurs, rubs, gallops or clicks. No pedal edema. Gastrointestinal system: Abdomen is nondistended, soft and nontender. No organomegaly or masses  felt. Normal bowel sounds heard. Central nervous system: Alert and oriented x2. No focal neurological deficits. Extremities: Symmetric 5 x 5 power. Skin: No rashes, lesions or ulcers Psychiatry: Judgement and insight appear normal. Mood & affect appropriate.     Data Reviewed: I have  personally reviewed following labs and imaging studies  CBC: Recent Labs  Lab 09/16/21 1507 09/17/21 0917  WBC 2.3* 2.9*  NEUTROABS 1.2* 1.4*  HGB 8.2* 11.5*  HCT 26.0* 35.6*  MCV 73.7* 72.5*  PLT 117* 031   Basic Metabolic Panel: Recent Labs  Lab 09/16/21 1507 09/16/21 1618 09/17/21 0917 09/18/21 0441 09/19/21 0632  NA 130* 135 137 135 136  K 4.8 4.2 3.7 4.4 3.8  CL 99 104 107 103 105  CO2 22 18* 23 24 24   GLUCOSE 647* 358* 260* 341* 202*  BUN 20 18 9 10 13   CREATININE 1.22* 1.44* 0.92 1.10* 1.05*  CALCIUM 8.7* 9.0 8.4* 8.9 8.8*  MG  --   --  1.6* 1.9 1.7  PHOS  --   --  2.4* 3.2  --    GFR: Estimated Creatinine Clearance: 54.1 mL/min (A) (by C-G formula based on SCr of 1.05 mg/dL (H)). Liver Function Tests: Recent Labs  Lab 09/16/21 1507  AST 21  ALT 15  ALKPHOS 170*  BILITOT 1.3*  PROT 6.9  ALBUMIN 3.5   No results for input(s): LIPASE, AMYLASE in the last 168 hours. Recent Labs  Lab 09/16/21 1618  AMMONIA 33   Coagulation Profile: Recent Labs  Lab 09/16/21 1713  INR 1.1   Cardiac Enzymes: No results for input(s): CKTOTAL, CKMB, CKMBINDEX, TROPONINI in the last 168 hours. BNP (last 3 results) No results for input(s): PROBNP in the last 8760 hours. HbA1C: Recent Labs    09/16/21 1507  HGBA1C 12.8*   CBG: Recent Labs  Lab 09/18/21 1146 09/18/21 1724 09/18/21 2119 09/19/21 0834 09/19/21 1230  GLUCAP 290* 303* 132* 172* 169*   Lipid Profile: No results for input(s): CHOL, HDL, LDLCALC, TRIG, CHOLHDL, LDLDIRECT in the last 72 hours. Thyroid Function Tests: Recent Labs    09/16/21 1618  TSH 3.480   Anemia Panel: No results for input(s): VITAMINB12, FOLATE, FERRITIN, TIBC, IRON, RETICCTPCT in the last 72 hours. Sepsis Labs: Recent Labs  Lab 09/16/21 1618 09/16/21 1825 09/17/21 0917 09/17/21 1446  PROCALCITON  --  <0.10  --   --   LATICACIDVEN 6.4*  --  1.4 2.2*    Recent Results (from the past 240 hour(s))  Resp Panel by  RT-PCR (Flu A&B, Covid) Urine, Catheterized     Status: None   Collection Time: 09/16/21  5:14 PM   Specimen: Urine, Catheterized; Nasopharyngeal(NP) swabs in vial transport medium  Result Value Ref Range Status   SARS Coronavirus 2 by RT PCR NEGATIVE NEGATIVE Final    Comment: (NOTE) SARS-CoV-2 target nucleic acids are NOT DETECTED.  The SARS-CoV-2 RNA is generally detectable in upper respiratory specimens during the acute phase of infection. The lowest concentration of SARS-CoV-2 viral copies this assay can detect is 138 copies/mL. A negative result does not preclude SARS-Cov-2 infection and should not be used as the sole basis for treatment or other patient management decisions. A negative result may occur with  improper specimen collection/handling, submission of specimen other than nasopharyngeal swab, presence of viral mutation(s) within the areas targeted by this assay, and inadequate number of viral copies(<138 copies/mL). A negative result must be combined with clinical observations, patient history, and epidemiological information. The expected  result is Negative.  Fact Sheet for Patients:  EntrepreneurPulse.com.au  Fact Sheet for Healthcare Providers:  IncredibleEmployment.be  This test is no t yet approved or cleared by the [place] FDA and  has been authorized for detection and/or diagnosis of SARS-CoV-2 by FDA under an Emergency Use Authorization (EUA). This EUA will remain  in effect (meaning this test can be used) for the duration of the COVID-19 declaration under Section 564(b)(1) of the Act, 21 U.S.C.section 360bbb-3(b)(1), unless the authorization is terminated  or revoked sooner.       Influenza A by PCR NEGATIVE NEGATIVE Final   Influenza B by PCR NEGATIVE NEGATIVE Final    Comment: (NOTE) The Xpert Xpress SARS-CoV-2/FLU/RSV plus assay is intended as an aid in the diagnosis of influenza from Nasopharyngeal swab  specimens and should not be used as a sole basis for treatment. Nasal washings and aspirates are unacceptable for Xpert Xpress SARS-CoV-2/FLU/RSV testing.  Fact Sheet for Patients: EntrepreneurPulse.com.au  Fact Sheet for Healthcare Providers: IncredibleEmployment.be  This test is not yet approved or cleared by the [place] FDA and has been authorized for detection and/or diagnosis of SARS-CoV-2 by FDA under an Emergency Use Authorization (EUA). This EUA will remain in effect (meaning this test can be used) for the duration of the COVID-19 declaration under Section 564(b)(1) of the Act, 21 U.S.C. section 360bbb-3(b)(1), unless the authorization is terminated or revoked.  Performed at [facility], [address], [place], [place] [address]   Urine Culture     Status: None   Collection Time: 09/16/21  5:14 PM   Specimen: Urine, Random  Result Value Ref Range Status   Specimen Description   Final    URINE, RANDOM Performed at [facility], [address], [place], [place] [address]    Special Requests   Final    NONE Performed at [facility], [address] [place], [place] [address]    Culture   Final    NO GROWTH Performed at [facility], [place] [address] [place], [place] [address]    Report Status 09/17/2021 FINAL  Final  Blood culture (routine single)     Status: None (Preliminary result)   Collection Time: 09/16/21  5:36 PM   Specimen: BLOOD  Result Value Ref Range Status   Specimen Description BLOOD RIGHT ANTECUBITAL  Final   Special Requests   Final    BOTTLES DRAWN AEROBIC AND ANAEROBIC Blood Culture adequate volume   Culture   Final    NO GROWTH 3 DAYS Performed at [facility], [address] [place], [place] [address]    Report Status PENDING  Incomplete  Respiratory (~20 pathogens) panel by PCR     Status: None   Collection Time: 09/18/21  1:45 PM   Specimen:  Nasopharyngeal Swab; Respiratory  Result Value Ref Range Status   Adenovirus NOT DETECTED NOT DETECTED Final   Coronavirus 229E NOT DETECTED NOT DETECTED Final    Comment: (NOTE) The Coronavirus on the Respiratory Panel, DOES NOT test for the novel  Coronavirus (2019 nCoV)    Coronavirus HKU1 NOT DETECTED NOT DETECTED Final   Coronavirus NL63 NOT DETECTED NOT DETECTED Final   Coronavirus OC43 NOT DETECTED NOT DETECTED Final   Metapneumovirus NOT DETECTED NOT DETECTED Final   Rhinovirus / Enterovirus NOT DETECTED NOT DETECTED Final   Influenza A NOT DETECTED NOT DETECTED Final   Influenza B NOT DETECTED NOT DETECTED Final   Parainfluenza Virus 1 NOT DETECTED  NOT DETECTED Final   Parainfluenza Virus 2 NOT DETECTED NOT DETECTED Final   Parainfluenza Virus 3 NOT DETECTED NOT DETECTED Final   Parainfluenza Virus 4 NOT DETECTED NOT DETECTED Final   Respiratory Syncytial Virus NOT DETECTED NOT DETECTED Final   Bordetella pertussis NOT DETECTED NOT DETECTED Final   Bordetella Parapertussis NOT DETECTED NOT DETECTED Final   Chlamydophila pneumoniae NOT DETECTED NOT DETECTED Final   Mycoplasma pneumoniae NOT DETECTED NOT DETECTED Final    Comment: Performed at [facility], [place] [address] [place], [place] [address]         Radiology Studies: EEG adult  Result Date: 10/12/2021 Chelsey Jack, MD     October 12, 2021  2:11 PM Routine EEG Report Chelsey [name] is a 70 y.o. female with a history of seizures who is undergoing an EEG to evaluate for seizures. Report: This EEG was acquired with electrodes placed according to the International 10-20 electrode system (including Fp1, Fp2, F3, F4, C3, C4, P3, P4, O1, O2, T3, T4, T5, T6, A1, A2, Fz, Cz, Pz). The following electrodes were missing or displaced: none. The occipital dominant rhythm was 8.5 Hz. This activity is reactive to stimulation. Drowsiness was manifested by background fragmentation; deeper stages of sleep were not identified. There  was focal slowing over the left temporal region. There were no interictal epileptiform discharges. There were no electrographic seizures identified. There was no abnormal response to photic stimulation. Hyperventilation was not performed. Impression and clinical correlation: This EEG was obtained while awake and drowsy and is abnormal due to focal slowing over the left temporal region indicating focal cerebral dysfunction in that area.   No electrographic seizures were observed during this recording. Chelsey Monks, MD [facility] [phone] If 7pm- 7am, please page neurology on call as listed in [place].        Scheduled Meds:  allopurinol  300 mg Oral Daily   amLODipine  10 mg Oral Daily   aspirin EC  81 mg Oral Daily   atorvastatin  10 mg Oral Daily   dapagliflozin propanediol  10 mg Oral Daily   enoxaparin (LOVENOX) injection  0.5 mg/kg Subcutaneous Q24H   ezetimibe  10 mg Oral Daily   insulin aspart  0-15 Units Subcutaneous TID WC & HS   insulin detemir  26 Units Subcutaneous BID   levothyroxine  50 mcg Oral Q0600   linaclotide  145 mcg Oral Daily   losartan  25 mg Oral Daily   mometasone-formoterol  2 puff Inhalation BID   pantoprazole  40 mg Oral Daily   QUEtiapine  50 mg Oral QHS   Continuous Infusions:   LOS: 2 days    Time spent: 27 minutes    Sharen Hones, MD [facility]   To contact the attending provider between 7A-7P or the covering provider during after hours 7P-7A, please log into the web site [email or web address] and access using universal [place] password for that web site. If you do not have the password, please call the hospital operator.  09/19/2021, 1:10 PM

## 2021-09-20 DIAGNOSIS — G9341 Metabolic encephalopathy: Secondary | ICD-10-CM | POA: Diagnosis not present

## 2021-09-20 DIAGNOSIS — R4182 Altered mental status, unspecified: Secondary | ICD-10-CM | POA: Diagnosis not present

## 2021-09-20 DIAGNOSIS — E1100 Type 2 diabetes mellitus with hyperosmolarity without nonketotic hyperglycemic-hyperosmolar coma (NKHHC): Secondary | ICD-10-CM | POA: Diagnosis not present

## 2021-09-20 DIAGNOSIS — D860 Sarcoidosis of lung: Secondary | ICD-10-CM | POA: Diagnosis not present

## 2021-09-20 LAB — CBC WITH DIFFERENTIAL/PLATELET
Abs Immature Granulocytes: 0 10*3/uL (ref 0.00–0.07)
Basophils Absolute: 0 10*3/uL (ref 0.0–0.1)
Basophils Relative: 1 %
Eosinophils Absolute: 0.2 10*3/uL (ref 0.0–0.5)
Eosinophils Relative: 6 %
HCT: 37.4 % (ref 36.0–46.0)
Hemoglobin: 11.2 g/dL — ABNORMAL LOW (ref 12.0–15.0)
Immature Granulocytes: 0 %
Lymphocytes Relative: 44 %
Lymphs Abs: 1.2 10*3/uL (ref 0.7–4.0)
MCH: 22.1 pg — ABNORMAL LOW (ref 26.0–34.0)
MCHC: 29.9 g/dL — ABNORMAL LOW (ref 30.0–36.0)
MCV: 73.8 fL — ABNORMAL LOW (ref 80.0–100.0)
Monocytes Absolute: 0.4 10*3/uL (ref 0.1–1.0)
Monocytes Relative: 15 %
Neutro Abs: 0.9 10*3/uL — ABNORMAL LOW (ref 1.7–7.7)
Neutrophils Relative %: 34 %
Platelets: 169 10*3/uL (ref 150–400)
RBC: 5.07 MIL/uL (ref 3.87–5.11)
RDW: 17.2 % — ABNORMAL HIGH (ref 11.5–15.5)
WBC: 2.6 10*3/uL — ABNORMAL LOW (ref 4.0–10.5)
nRBC: 0 % (ref 0.0–0.2)

## 2021-09-20 LAB — GLUCOSE, CAPILLARY
Glucose-Capillary: 129 mg/dL — ABNORMAL HIGH (ref 70–99)
Glucose-Capillary: 247 mg/dL — ABNORMAL HIGH (ref 70–99)
Glucose-Capillary: 280 mg/dL — ABNORMAL HIGH (ref 70–99)
Glucose-Capillary: 199 mg/dL — ABNORMAL HIGH (ref 70–99)

## 2021-09-20 MED ORDER — EZETIMIBE 10 MG PO TABS: 10.0000 mg | ORAL_TABLET | Freq: Every day | ORAL | 0 refills | Status: DC

## 2021-09-20 MED ORDER — QUETIAPINE FUMARATE 50 MG PO TABS: 50.0000 mg | ORAL_TABLET | Freq: Every day | ORAL | 0 refills | Status: DC

## 2021-09-20 MED ORDER — INSULIN ISOPHANE & REGULAR (HUMAN 70-30)100 UNIT/ML KWIKPEN: 25.0000 [IU] | PEN_INJECTOR | Freq: Two times a day (BID) | SUBCUTANEOUS | 0 refills | Status: DC

## 2021-09-20 MED ORDER — LINACLOTIDE 145 MCG PO CAPS: 145.0000 ug | ORAL_CAPSULE | Freq: Every day | ORAL | 0 refills | Status: DC

## 2021-09-20 MED ORDER — ASPIRIN EC 81 MG PO TBEC: 81.0000 mg | DELAYED_RELEASE_TABLET | Freq: Every day | ORAL | 0 refills | Status: DC

## 2021-09-20 NOTE — TOC Progression Note (Signed)
Transition of Care [facility]) - Progression Note    Patient Details  Name: Chelsey [name] MRN: [id number] Date of Birth: 21-Nov-1950  [facility]) CM/SW Contact  Izola Price, RN Phone Number: 09/20/2021, 1:57 PM  Clinical Narrative: 11/5: IVC discontinued based on dc plan to go home with sister with 24 hour supervision. Patient then stated to Unit RN and sister she would not go to sister home on discharge. According to Unit RN notes, sister and patient has an argument and sister left saying she could not care for patient. Provider ordered another psych re-evaluation. Current unsafe discharge plan pending psych evaluation for revised discharge issues. Simmie Davies RN CM       Expected Discharge Plan: Home/Self Care Barriers to Discharge: Continued Medical Work up  Expected Discharge Plan and Services Expected Discharge Plan: Home/Self Care   Discharge Planning Services: CM Consult     Expected Discharge Date: 09/20/21               DME Arranged: N/A DME Agency: NA                   Social Determinants of Health (SDOH) Interventions    Readmission Risk Interventions Readmission Risk Prevention Plan 09/19/2021  Transportation Screening Complete  PCP or Specialist Appt within 3-5 Days Complete  HRI or [place] Complete  Social Work Consult for [place] Planning/Counseling Complete  Palliative Care Screening Not Applicable  Medication Review [employer] photographer) Complete  Some recent data might be hidden
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
## 2021-09-20 NOTE — Progress Notes (Signed)
Subjective: Sitter in room with patient, who is confused.   Objective: Current vital signs: BP 125/81 (BP Location: Left Arm)   Pulse 70   Temp (!) 97.5 F (36.4 C) (Oral)   Resp 16   Ht 5\' 3"  (1.6 m)   Wt 95.6 kg   SpO2 100%   BMI 37.34 kg/m  Vital signs in last 24 hours: Temp:  [97.5 F (36.4 C)-98.2 F (36.8 C)] 97.5 F (36.4 C) (11/05 0740) Pulse Rate:  [52-72] 70 (11/05 0740) Resp:  [16-18] 16 (11/05 0740) BP: (119-142)/(54-81) 125/81 (11/05 0740) SpO2:  [90 %-100 %] 100 % (11/05 0740)  Intake/Output from previous day: 11/04 0701 - 11/05 0700 In: 510 [P.O.:510] Out: -  Intake/Output this shift: No intake/output data recorded. Nutritional status:  Diet Order             Diet heart healthy/carb modified Room service appropriate? Yes; Fluid consistency: Thin  Diet effective now                  HEENT: [place]/AT Lungs: Respirations unlabored Ext: No edema  Neurologic Exam: Ment: Awake and alert. Pleasant, cooperative, non-agitated. Affect euthymic. Speech is fluent with intact comprehension for basic commands, but becomes confused and gives tangential answers when asking more complex open ended questions about her current situation and how she is feeling. Named 2/3 fingers. Oriented to the city, state, day, month and year, but stated that she was in an apartment. She did not seem surprised nor to care when I told her she was in a hospital. Good eye contact. At end of interview, began expressing paranoid thoughts regarding her sister.  CN: Temporal visual fields intact with no extinction to DSS. EOMI. Smile symmetric. Phonation intact.  Motor: 5/5 x 4  Sensory: Intact to FT x 4. No extinction to DSS.  Cerebellar: No ataxia with FNF bilaterally Other: No jerking, twitching or other adventitious movements noted.   Lab Results: Results for orders placed or performed during the hospital encounter of 09/16/21 (from the past 48 hour(s))  Glucose, capillary     Status:  Abnormal   Collection Time: 09/18/21 11:46 AM  Result Value Ref Range   Glucose-Capillary 290 (H) 70 - 99 mg/dL    Comment: Glucose reference range applies only to samples taken after fasting for at least 8 hours.   Comment 1 Notify RN   Respiratory (~20 pathogens) panel by PCR     Status: None   Collection Time: 09/18/21  1:45 PM   Specimen: Nasopharyngeal Swab; Respiratory  Result Value Ref Range   Adenovirus NOT DETECTED NOT DETECTED   Coronavirus 229E NOT DETECTED NOT DETECTED    Comment: (NOTE) The Coronavirus on the Respiratory Panel, DOES NOT test for the novel  Coronavirus (2019 nCoV)    Coronavirus HKU1 NOT DETECTED NOT DETECTED   Coronavirus NL63 NOT DETECTED NOT DETECTED   Coronavirus OC43 NOT DETECTED NOT DETECTED   Metapneumovirus NOT DETECTED NOT DETECTED   Rhinovirus / Enterovirus NOT DETECTED NOT DETECTED   Influenza A NOT DETECTED NOT DETECTED   Influenza B NOT DETECTED NOT DETECTED   Parainfluenza Virus 1 NOT DETECTED NOT DETECTED   Parainfluenza Virus 2 NOT DETECTED NOT DETECTED   Parainfluenza Virus 3 NOT DETECTED NOT DETECTED   Parainfluenza Virus 4 NOT DETECTED NOT DETECTED   Respiratory Syncytial Virus NOT DETECTED NOT DETECTED   Bordetella pertussis NOT DETECTED NOT DETECTED   Bordetella Parapertussis NOT DETECTED NOT DETECTED   Chlamydophila pneumoniae NOT  DETECTED NOT DETECTED   Mycoplasma pneumoniae NOT DETECTED NOT DETECTED    Comment: Performed at [facility], [place] [address], [place], [place] [address]  Glucose, capillary     Status: Abnormal   Collection Time: 09/18/21  5:24 PM  Result Value Ref Range   Glucose-Capillary 303 (H) 70 - 99 mg/dL    Comment: Glucose reference range applies only to samples taken after fasting for at least 8 hours.  Glucose, capillary     Status: Abnormal   Collection Time: 09/18/21  9:19 PM  Result Value Ref Range   Glucose-Capillary 132 (H) 70 - 99 mg/dL    Comment: Glucose reference range applies only to  samples taken after fasting for at least 8 hours.  Basic metabolic panel     Status: Abnormal   Collection Time: 09/19/21  6:32 AM  Result Value Ref Range   Sodium 136 135 - 145 mmol/L   Potassium 3.8 3.5 - 5.1 mmol/L   Chloride 105 98 - 111 mmol/L   CO2 24 22 - 32 mmol/L   Glucose, Bld 202 (H) 70 - 99 mg/dL    Comment: Glucose reference range applies only to samples taken after fasting for at least 8 hours.   BUN 13 8 - 23 mg/dL   Creatinine, Ser 1.05 (H) 0.44 - 1.00 mg/dL   Calcium 8.8 (L) 8.9 - 10.3 mg/dL   GFR, Estimated 57 (L) >60 mL/min    Comment: (NOTE) Calculated using the CKD-EPI Creatinine Equation (2021)    Anion gap 7 5 - 15    Comment: Performed at [facility], [address] [place], [place] [address]  Magnesium     Status: None   Collection Time: 09/19/21  6:32 AM  Result Value Ref Range   Magnesium 1.7 1.7 - 2.4 mg/dL    Comment: Performed at [facility], [address] [place], [place] [address]  Glucose, capillary     Status: Abnormal   Collection Time: 09/19/21  8:34 AM  Result Value Ref Range   Glucose-Capillary 172 (H) 70 - 99 mg/dL    Comment: Glucose reference range applies only to samples taken after fasting for at least 8 hours.  Glucose, capillary     Status: Abnormal   Collection Time: 09/19/21 12:30 PM  Result Value Ref Range   Glucose-Capillary 169 (H) 70 - 99 mg/dL    Comment: Glucose reference range applies only to samples taken after fasting for at least 8 hours.  Glucose, capillary     Status: Abnormal   Collection Time: 09/19/21  4:07 PM  Result Value Ref Range   Glucose-Capillary 103 (H) 70 - 99 mg/dL    Comment: Glucose reference range applies only to samples taken after fasting for at least 8 hours.  Glucose, capillary     Status: Abnormal   Collection Time: 09/19/21  8:24 PM  Result Value Ref Range   Glucose-Capillary 222 (H) 70 - 99 mg/dL    Comment: Glucose reference range applies only to samples taken after  fasting for at least 8 hours.  CBC with Differential/Platelet     Status: Abnormal   Collection Time: 09/20/21  4:08 AM  Result Value Ref Range   WBC 2.6 (L) 4.0 - 10.5 K/uL   RBC 5.07 3.87 - 5.11 MIL/uL   Hemoglobin 11.2 (L) 12.0 - 15.0 g/dL   HCT 37.4 36.0 - 46.0 %   MCV 73.8 (L) 80.0 - 100.0 fL   MCH 22.1 (  L) 26.0 - 34.0 pg   MCHC 29.9 (L) 30.0 - 36.0 g/dL   RDW 17.2 (H) 11.5 - 15.5 %   Platelets 169 150 - 400 K/uL   nRBC 0.0 0.0 - 0.2 %   Neutrophils Relative % 34 %   Neutro Abs 0.9 (L) 1.7 - 7.7 K/uL   Lymphocytes Relative 44 %   Lymphs Abs 1.2 0.7 - 4.0 K/uL   Monocytes Relative 15 %   Monocytes Absolute 0.4 0.1 - 1.0 K/uL   Eosinophils Relative 6 %   Eosinophils Absolute 0.2 0.0 - 0.5 K/uL   Basophils Relative 1 %   Basophils Absolute 0.0 0.0 - 0.1 K/uL   Immature Granulocytes 0 %   Abs Immature Granulocytes 0.00 0.00 - 0.07 K/uL    Comment: Performed at [facility], [address] [place], [place] [address]  Glucose, capillary     Status: Abnormal   Collection Time: 09/20/21  7:42 AM  Result Value Ref Range   Glucose-Capillary 129 (H) 70 - 99 mg/dL    Comment: Glucose reference range applies only to samples taken after fasting for at least 8 hours.    Recent Results (from the past 240 hour(s))  Resp Panel by RT-PCR (Flu A&B, Covid) Urine, Catheterized     Status: None   Collection Time: 09/16/21  5:14 PM   Specimen: Urine, Catheterized; Nasopharyngeal(NP) swabs in vial transport medium  Result Value Ref Range Status   SARS Coronavirus 2 by RT PCR NEGATIVE NEGATIVE Final    Comment: (NOTE) SARS-CoV-2 target nucleic acids are NOT DETECTED.  The SARS-CoV-2 RNA is generally detectable in upper respiratory specimens during the acute phase of infection. The lowest concentration of SARS-CoV-2 viral copies this assay can detect is 138 copies/mL. A negative result does not preclude SARS-Cov-2 infection and should not be used as the sole basis for treatment  or other patient management decisions. A negative result may occur with  improper specimen collection/handling, submission of specimen other than nasopharyngeal swab, presence of viral mutation(s) within the areas targeted by this assay, and inadequate number of viral copies(<138 copies/mL). A negative result must be combined with clinical observations, patient history, and epidemiological information. The expected result is Negative.  Fact Sheet for Patients:  EntrepreneurPulse.com.au  Fact Sheet for Healthcare Providers:  IncredibleEmployment.be  This test is no t yet approved or cleared by the [place] FDA and  has been authorized for detection and/or diagnosis of SARS-CoV-2 by FDA under an Emergency Use Authorization (EUA). This EUA will remain  in effect (meaning this test can be used) for the duration of the COVID-19 declaration under Section 564(b)(1) of the Act, 21 U.S.C.section 360bbb-3(b)(1), unless the authorization is terminated  or revoked sooner.       Influenza A by PCR NEGATIVE NEGATIVE Final   Influenza B by PCR NEGATIVE NEGATIVE Final    Comment: (NOTE) The Xpert Xpress SARS-CoV-2/FLU/RSV plus assay is intended as an aid in the diagnosis of influenza from Nasopharyngeal swab specimens and should not be used as a sole basis for treatment. Nasal washings and aspirates are unacceptable for Xpert Xpress SARS-CoV-2/FLU/RSV testing.  Fact Sheet for Patients: EntrepreneurPulse.com.au  Fact Sheet for Healthcare Providers: IncredibleEmployment.be  This test is not yet approved or cleared by the [place] FDA and has been authorized for detection and/or diagnosis of SARS-CoV-2 by FDA under an Emergency Use Authorization (EUA). This EUA will remain in effect (meaning this test can be used) for the duration of the  COVID-19 declaration under Section 564(b)(1) of the Act, 21 U.S.C. section  360bbb-3(b)(1), unless the authorization is terminated or revoked.  Performed at [facility], [address], [place], [place] [address]   Urine Culture     Status: None   Collection Time: 09/16/21  5:14 PM   Specimen: Urine, Random  Result Value Ref Range Status   Specimen Description   Final    URINE, RANDOM Performed at [facility], [address] [place], [place] [address]    Special Requests   Final    NONE Performed at [facility], [address] [place], [place] [address]    Culture   Final    NO GROWTH Performed at [facility], [place] [address] [place], [place] [address]    Report Status 09/17/2021 FINAL  Final  Blood culture (routine single)     Status: None (Preliminary result)   Collection Time: 09/16/21  5:36 PM   Specimen: BLOOD  Result Value Ref Range Status   Specimen Description BLOOD RIGHT ANTECUBITAL  Final   Special Requests   Final    BOTTLES DRAWN AEROBIC AND ANAEROBIC Blood Culture adequate volume   Culture   Final    NO GROWTH 4 DAYS Performed at [facility], [address] [place], [place] [address]    Report Status PENDING  Incomplete  Respiratory (~20 pathogens) panel by PCR     Status: None   Collection Time: 09/18/21  1:45 PM   Specimen: Nasopharyngeal Swab; Respiratory  Result Value Ref Range Status   Adenovirus NOT DETECTED NOT DETECTED Final   Coronavirus 229E NOT DETECTED NOT DETECTED Final    Comment: (NOTE) The Coronavirus on the Respiratory Panel, DOES NOT test for the novel  Coronavirus (2019 nCoV)    Coronavirus HKU1 NOT DETECTED NOT DETECTED Final   Coronavirus NL63 NOT DETECTED NOT DETECTED Final   Coronavirus OC43 NOT DETECTED NOT DETECTED Final   Metapneumovirus NOT DETECTED NOT DETECTED Final   Rhinovirus / Enterovirus NOT DETECTED NOT DETECTED Final   Influenza A NOT DETECTED NOT DETECTED Final   Influenza B NOT DETECTED NOT DETECTED Final   Parainfluenza Virus 1 NOT  DETECTED NOT DETECTED Final   Parainfluenza Virus 2 NOT DETECTED NOT DETECTED Final   Parainfluenza Virus 3 NOT DETECTED NOT DETECTED Final   Parainfluenza Virus 4 NOT DETECTED NOT DETECTED Final   Respiratory Syncytial Virus NOT DETECTED NOT DETECTED Final   Bordetella pertussis NOT DETECTED NOT DETECTED Final   Bordetella Parapertussis NOT DETECTED NOT DETECTED Final   Chlamydophila pneumoniae NOT DETECTED NOT DETECTED Final   Mycoplasma pneumoniae NOT DETECTED NOT DETECTED Final    Comment: Performed at [facility], [place]. [address], [place],  [address]    Lipid Panel No results for input(s): CHOL, TRIG, HDL, CHOLHDL, VLDL, LDLCALC in the last 72 hours.  Studies/Results: MR BRAIN W WO CONTRAST  Result Date: 09/19/2021 CLINICAL DATA:  Mental status change, unknown cause EXAM: MRI HEAD WITHOUT AND WITH CONTRAST TECHNIQUE: Multiplanar, multiecho pulse sequences of the brain and surrounding structures were obtained without and with intravenous contrast. CONTRAST:  11mL GADAVIST GADOBUTROL 1 MMOL/ML IV SOLN COMPARISON:  09/16/2021 CT head, no prior MRI head FINDINGS: Brain: No restricted diffusion to suggest acute infarct. No abnormal enhancement. No acute hemorrhage, mass, mass effect, or midline shift. Confluent T2 hyperintense signal in the periventricular white matter, likely the sequela of severe chronic small vessel ischemic disease. 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 Vascular: Normal flow voids. Skull and upper cervical spine: Normal marrow signal. Sinuses/Orbits: Mucosal thickening in the right greater than left maxillary sinus. Status post bilateral lens replacements. Other: Trace fluid in left  mastoid tip. IMPRESSION: No acute intracranial process. Electronically Signed   By: Merilyn Baba M.D.   On: 09/19/2021 15:23    Medications: Scheduled:  allopurinol  300 mg Oral Daily   amLODipine  10 mg Oral Daily   aspirin EC  81 mg Oral Daily   atorvastatin  10 mg Oral Daily   dapagliflozin propanediol  10 mg Oral Daily   enoxaparin (LOVENOX) injection  0.5 mg/kg Subcutaneous Q24H   ezetimibe  10 mg Oral Daily   insulin aspart  0-15 Units Subcutaneous TID WC & HS   insulin detemir  26 Units Subcutaneous BID   levothyroxine  50 mcg Oral Q0600   linaclotide  145 mcg Oral Daily   losartan  25 mg Oral Daily   mometasone-formoterol  2 puff Inhalation BID   pantoprazole  40 mg Oral Daily   QUEtiapine  50 mg Oral QHS   MRI brain (images personally reviewed):  - No acute abnormality. - Severe chronic small vessel ischemic disease.  - Lacunar infarcts in the bilateral basal ganglia and right thalamus, with dilated perivascular spaces, right greater than left. 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 The latter may also  represent an old cortical contusion as it is located at the temporal pole.  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   1. Exam on admission revealed and alert patient with confusion, poor concentration and inability to perform complex commands, such as a 3-step directional command or the correct movements for cerebellar testing. Perseverates at times. Could name a thumb, pinky finger and pen, but not an index finger or pen. Mildly impaired repetition. Concentration impaired; not able to recite months of the year backwards despite being able  to do so forwards. Poor insight. Unable to recall the name of the president or the state directly to the [place].  2. Exam today is essentially unchanged from yesterday. Expresses paranoid thoughts about her sister. Oriented x 5 but thinks that she is in an apartment building. She seemed unconcerned when I corrected her and informed her that she was in a hospital. When asked why, she was unable to provide any insight into possible reasons for hospitalization. In this context she is calm, cooperative and with fluent speech, able to follow all motor commands.  3. Test results:  - TSH normal - Blood and urine cultures are negative to date. Nasopharyngeal swab viral panel is negative.  - MRI brain: In addition to old infarcts and chronic small vessel ischemic changes, MRI brain is with stable appearance of a chronic left temporal pole encephalomalacic lesion which may be due to an old cortical contusion or unusually located ischemic infarction. This finding, based on appearance, could serve as an epileptic focus.  - Of note,  severely elevated glucose of 647 on admission. Levels this high can provoke focal seizures.  - EEG revealed focal slowing over the left temporal region indicating focal cerebral dysfunction in that area. No electrographic seizures were seen. 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  4. Relatively high on the DDx for the patient's neurological presentation is a subclinical or unwitnessed partial complex seizure triggered by hyperglycemia, with subsequent postictal state. Also relatively likely would be a toxic/metabolic encephalopathy given her multiple medications and poor renal function. 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  5. Expressing paranoid thoughts. Has no known prior psychiatric history.   Recommendations: 1.  Contact family to determine if the patient has a psychiatric history. 2. Also discuss with family if patient has shown evidence for gradually declining cognition over time, including memory and concentration changes as well as any change in her abilities regarding planning and complex reasoning. If she has and the change has been gradual, then an outpatient dementia evaluation should be obtained.  3. Glycemic control 4. Neurohospitalist service will sign off. Please call if there are additional questions.     LOS: 3 days   @Electronically  signed: Dr. Kerney Elbe 09/20/2021  8:03 AM

## 2021-09-20 NOTE — Discharge Summary (Signed)
Physician Discharge Summary  Patient ID: Chelsey [name] MRN: [id number] DOB/AGE: 71-Aug-1952 71 y.o.  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 I have discussed with patient and her sister this morning, she has been compliant with her insulin for diabetes, she does not have any recent sickness, no nausea vomiting or diarrhea. Was very confused yesterday, agitated arriving the emergency room.  Was given sedatives.  She had severe hyperglycemia with glucose more than 600, lactic acidosis of 6.0.  She was given fluids, she also received a insulin drip.  She does not have anion gap, no DKA.  Acute encephalopathy.  Most likely metabolic due to severe hyperglycemia. Uncontrolled diabetes with diabetes hyperosmolality with altered mental status. Severe lactic acidosis. Elevated troponin secondary to diabetes hyperosmolality. Seizure disorder. 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 Patient glucose much better controlled. She slept better last night with the Seroquel, she has some confusion, but no agitation. MRI of the brain did not show any acute changes. Patient probably has significant dementia with  delirium.  I will continue evening time Seroquel.  Patient be followed by neurology as outpatient. I also did adjust patient insulin dose to Humulin 70/30 25 units twice a day.  Patient probably was not using her insulin before admission due to mental confusion. Currently, patient mental status improved, psychiatry has reevaluated patient again, cleared IVC.  Had discussion with the patient and sister, patient will go back to her sister's house, she will be under 24-hour supervision by sisters. She will be also followed by PCP as outpatient   Acute kidney injury. Hypomagnesemia Mild hypophosphatemia. Condition had improved.  Sarcoidosis of the lungs. Follow-up with PCP as outpatient.  Obstruct sleep apnea. Continue nocturnal oxygen.   Leukopenia.  No neutropenia. Respiratory viral panel negative.   Consults: neurology and psychiatry  Significant Diagnostic Studies:  MRI HEAD WITHOUT AND WITH CONTRAST   TECHNIQUE: Multiplanar, multiecho pulse sequences of the brain and surrounding structures were obtained without and with intravenous contrast.   CONTRAST:  4m GADAVIST GADOBUTROL 1 MMOL/ML IV SOLN   COMPARISON:  09/16/2021 CT head, no prior MRI head   FINDINGS: Brain: No restricted diffusion to suggest acute infarct. No abnormal enhancement. No acute hemorrhage, mass, mass effect, or midline shift. Confluent T2 hyperintense signal in the periventricular white matter, likely the sequela of severe chronic small vessel ischemic disease. 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   Vascular: Normal flow voids.   Skull and upper cervical spine: Normal marrow signal.   Sinuses/Orbits: Mucosal  thickening in the right greater than left maxillary sinus.  Status post bilateral lens replacements.   Other: Trace fluid in left mastoid tip.   IMPRESSION: No acute intracranial process.     Electronically Signed   By: Merilyn Baba M.D.   On: 09/19/2021 15:23    Treatments: insulin, seroquel  Discharge Exam: Blood pressure 125/81, pulse 70, temperature (!) 97.5 F (36.4 C), temperature source Oral, resp. rate 16, height 5' 3"  (1.6 m), weight 95.6 kg, SpO2 100 %. General appearance: alert and cooperative Resp: clear to auscultation bilaterally Cardio: regular rate and rhythm, S1, S2 normal, no murmur, click, rub or gallop GI: soft, non-tender; bowel sounds normal; no masses,  no organomegaly Extremities: extremities normal, atraumatic, no cyanosis or edema  Disposition: Discharge disposition: 01-Home or Self Care      Discharge Instructions     Diet - low sodium heart healthy   Complete by: As directed    Calorie controlled   Increase activity slowly   Complete by: As directed       Allergies as of 09/20/2021       Reactions   Oxycodone-acetaminophen Other (See Comments)   Hallucinations Hallucinations HALLUCINATIONS Other reaction(s): Other (See Comments) HALLUCINATIONS Other reaction(s): Other (See Comments) Hallucinations Hallucinations HALLUCINATIONS Hallucinations Other reaction(s): Other (See Comments), Other (See Comments) HALLUCINATIONS Hallucinations Hallucinations HALLUCINATIONS Other reaction(s): Other (See Comments) HALLUCINATIONS Other reaction(s): Other (See Comments) Hallucinations Hallucinations HALLUCINATIONS Hallucinations Other reaction(s): Other (See Comments) HALLUCINATIONS Other reaction(s): Other (See Comments) Hallucinations Hallucinations HALLUCINATIONS Hallucinations   Indomethacin Other (See Comments)   BURN HOLE IN STOMACH BURN HOLE IN STOMACH Other reaction(s): Other (See Comments), Other (See Comments) BURN  HOLE IN STOMACH BURN HOLE IN STOMACH BURN HOLE IN STOMACH BURN HOLE IN STOMACH BURN HOLE IN STOMACH   Penicillins Rash        Medication List     STOP taking these medications    colchicine 0.6 MG tablet   hydrALAZINE 10 MG tablet Commonly known as: APRESOLINE   insulin lispro 100 UNIT/ML KwikPen Commonly known as: HUMALOG   potassium chloride SA 20 MEQ tablet Commonly known as: KLOR-CON   Tresiba FlexTouch 200 UNIT/ML FlexTouch Pen Generic drug: insulin degludec       TAKE these medications    Accu-Chek Aviva Plus w/Device Kit Use as directed.   albuterol 108 (90 Base) MCG/ACT inhaler Commonly known as: VENTOLIN HFA Inhale 2 puffs into the lungs every 6 (six) hours as needed for wheezing or shortness of breath.   allopurinol 300 MG tablet Commonly known as: ZYLOPRIM TAKE 1 TABLET BY MOUTH DAILY   amLODipine 10 MG tablet Commonly known as: NORVASC Take 1 tablet (10 mg total) by mouth daily.   aspirin EC 81 MG tablet Take 1 tablet (81 mg total) by mouth daily.   atorvastatin 10 MG tablet Commonly known as: LIPITOR TAKE 1 TABLET BY MOUTH DAILY FOR CHOLESTEROL   cilostazol 50 MG tablet Commonly known as: PLETAL TAKE 1 TABLET BY MOUTH TWICE A DAY   ezetimibe 10 MG tablet Commonly known as: ZETIA Take 1 tablet (10 mg total) by mouth daily. What changed:  how much to take when to take this   Farxiga 10 MG Tabs tablet Generic drug: dapagliflozin propanediol TAKE 1 TABLET BY MOUTH DAILY   Fluticasone-Salmeterol 250-50 MCG/DOSE Aepb Commonly known as: ADVAIR Inhale 1 puff into the lungs 2 (two) times daily.   gabapentin 300 MG capsule Commonly known as: NEURONTIN TAKE 1 CAPSULE BY MOUTH 3 TIMES A DAY   glucose blood test strip Test  sugar 4 x aday for uncontrolled dm on insulin E11.22   hydroxychloroquine 200 MG tablet Commonly known as: PLAQUENIL Take 1 tablet (200 mg total) by mouth 2 (two) times daily.   insulin isophane & regular human  KwikPen (70-30) 100 UNIT/ML KwikPen Commonly known as: HUMULIN 70/30 MIX Inject 25 Units into the skin 2 (two) times daily.   Lancets Ultra Thin Misc Apply 1 each topically daily.   levothyroxine 50 MCG tablet Commonly known as: SYNTHROID TAKE 1 TABLET BY MOUTH IN THE MORNING ONAN EMPTY STOMACH FOR UNDERACTIVE THYROID   linaclotide 145 MCG Caps capsule Commonly known as: Linzess Take 1 capsule (145 mcg total) by mouth daily.   losartan 50 MG tablet Commonly known as: COZAAR TAKE 1 TABLET BY MOUTH DAILY FOR HYPENTENSION   omeprazole 20 MG capsule Commonly known as: PRILOSEC TAKE ONE CAPSULE BY MOUTH TWICE A DAY FOR GERD   OXYGEN Inhale 3 L into the lungs. Nighttime use   QUEtiapine 50 MG tablet Commonly known as: SEROQUEL Take 1 tablet (50 mg total) by mouth at bedtime.   torsemide 10 MG tablet Commonly known as: DEMADEX Take 10 mg by mouth daily.   UltiCare Short Pen Needles 31G X 8 MM Misc Generic drug: Insulin Pen Needle USE 3 TIMES A DAY        Follow-up Information     Lavera Guise, MD Follow up in 1 week(s).   Specialties: Internal Medicine, Cardiology Contact information: [place] [address] [phone]         [facility] Follow up in 2 week(s).   Contact information: [place] [phone]               42 minutes Signed: Sharen Hones 09/20/2021, 10:59 AM

## 2021-09-20 NOTE — Progress Notes (Signed)
Responded to call, patient wanting to know when she is going home.  Patient's sister in the room.  Patient said that she is not going home with her sister, exchanged words with her sister and fought.  Patient's sister stated that she cannot take care of the patient and left.  Dr. Roosevelt Locks made aware of the situation.

## 2021-09-20 NOTE — Progress Notes (Signed)
Patient orientedx2-3, improved from prior night. Took all scheduled medications per [facility]. 1:1 sitter for IVC at bedside. Patient agitated and wanting to leave in early AM, frustrated being in the hospital. Redirected to wait for the doctors to round and make sure she is healthy and improving, PRN ativan given per [facility]. Fall/safety precautions in place, rounding performed.

## 2021-09-20 NOTE — TOC Progression Note (Signed)
Transition of Care [facility]) - Progression Note    Patient Details  Name: KYLEE UMANA MRN: [id number] Date of Birth: 14-Dec-1950  Transition of [facility]) CM/SW Contact  Izola Price, RN Phone Number: 09/20/2021, 3:28 PM  Clinical Narrative:  11/5: 330 pm. IVC per psych re-evaluation, stated patient does not have capacity. Discharge canceled per provider until safe discharge plan present/re-directed given patient  Situation. TOC will monitor. Simmie Davies RN CM    Expected Discharge Plan: Home/Self Care Barriers to Discharge: Other (must enter comment), Continued Medical Work up (pending psych re-evaluation due to patient refusing safe discharge plan.)  Expected Discharge Plan and Services Expected Discharge Plan: Home/Self Care   Discharge Planning Services: CM Consult     Expected Discharge Date: 09/20/21               DME Arranged: N/A DME Agency: NA                   Social Determinants of Health (SDOH) Interventions    Readmission Risk Interventions Readmission Risk Prevention Plan 09/19/2021  Transportation Screening Complete  PCP or Specialist Appt within 3-5 Days Complete  HRI or [place] Complete  Social Work Consult for [place] Planning/Counseling Complete  Palliative Care Screening Not Applicable  Medication Review [employer]) Complete  Some recent data might be hidden

## 2021-09-20 NOTE — Progress Notes (Addendum)
Responded to call from patient.  IVC sitter in the room, patient waiting for RN by the door stating that she wants to go home.  Patient is restless, attempted verbal deescalation by telling patient doctors will be rounding soon.  Patient said she wants to go home today, ride home she is unsure of but stated she will call a family member.  Will re-evaluate available prn meds for agitation/anxiety.

## 2021-09-21 DIAGNOSIS — E1100 Type 2 diabetes mellitus with hyperosmolarity without nonketotic hyperglycemic-hyperosmolar coma (NKHHC): Secondary | ICD-10-CM | POA: Diagnosis not present

## 2021-09-21 DIAGNOSIS — D860 Sarcoidosis of lung: Secondary | ICD-10-CM | POA: Diagnosis not present

## 2021-09-21 LAB — GLUCOSE, CAPILLARY
Glucose-Capillary: 158 mg/dL — ABNORMAL HIGH (ref 70–99)
Glucose-Capillary: 213 mg/dL — ABNORMAL HIGH (ref 70–99)
Glucose-Capillary: 267 mg/dL — ABNORMAL HIGH (ref 70–99)
Glucose-Capillary: 332 mg/dL — ABNORMAL HIGH (ref 70–99)
Glucose-Capillary: 342 mg/dL — ABNORMAL HIGH (ref 70–99)

## 2021-09-21 LAB — CULTURE, BLOOD (SINGLE)
Culture: NO GROWTH
Special Requests: ADEQUATE

## 2021-09-21 MED ORDER — PHENOL 1.4 % MT LIQD
1.0000 | OROMUCOSAL | Status: DC | PRN
  Filled 2021-09-21 (×2): qty 177

## 2021-09-21 MED ORDER — DAPAGLIFLOZIN PROPANEDIOL 10 MG PO TABS
10.0000 mg | ORAL_TABLET | Freq: Every day | ORAL | Status: DC
  Administered 2021-09-21 – 2021-09-25 (×5): 10 mg via ORAL
  Filled 2021-09-21 (×7): qty 1

## 2021-09-21 NOTE — Progress Notes (Signed)
PROGRESS NOTE    Chelsey [name]  FYB:[id number] DOB: February 23, 1951 DOA: 09/16/2021 PCP: Lavera Guise, MD    Brief Narrative:  Chelsey [name] is a 70 year old female with history of COPD, on nocturnal oxygen with obstructive sleep apnea, type 2 diabetes, coronary disease, essential hypertension, hypothyroidism, peripheral vascular disease, pulmonary sarcoidosis, history of seizures with history of brain aneurysm, rheumatoid arthritis to the hospital with altered mental status. I have discussed with patient and her sister this morning, she has been compliant with her insulin for diabetes, she does not have any recent sickness, no nausea vomiting or diarrhea. Was very confused yesterday, agitated arriving the emergency room.  Was given sedatives.  She had severe hyperglycemia with glucose more than 600, lactic acidosis of 6.0.  She was given fluids, she also received a insulin drip.  She does not have anion gap, no DKA. 11/5.  Patient is medically stable to be discharged.  However, patient was deemed to be lacking decision-making capacity.  Social worker is working on Hospital doctor.   Assessment & Plan:   Principal Problem:   Acute metabolic encephalopathy Active Problems:   Hypothyroidism due to acquired atrophy of thyroid   Sarcoidosis of lung ([place])   Type 2 diab w hyprosm w/o nonket hyprgly-hypros coma [facility]) ([place])   Stage 3b chronic kidney disease ([place])   AMS (altered mental status)   Uncontrolled type 2 diabetes mellitus with hyperglycemia (HCC)  Acute encephalopathy.  Most likely metabolic due to severe hyperglycemia. Uncontrolled diabetes with diabetes hyperosmolality with altered mental status. Severe lactic acidosis. Elevated troponin secondary to diabetes hyperosmolality. Seizure disorder. Dementia with delirium. Ruled in. Patient condition has improved, currently no safe discharge options.  We will continue to follow while in the hospital until safe discharge plan is  available.    Acute kidney injury. Hypomagnesemia Mild hypophosphatemia. Condition had improved.  Sarcoidosis of the lungs. Follow-up with PCP as outpatient.  Obstruct sleep apnea. Continue nocturnal oxygen.   Leukopenia.  No neutropenia. Respiratory viral panel negative.       DVT prophylaxis: SCDs Code Status: full Family Communication:  Disposition Plan:      Status is: Inpatient due to unsafe discharge plan.      I/O last 3 completed shifts: In: 1040 [P.O.:1040] Out: -  Total I/O In: 360 [P.O.:360] Out: -       Subjective: Patient insist to go back to her own home. She slept well last night, she has some confusion. Denies any short of breath or cough. No abdominal pain nausea vomiting.  Objective: Vitals:   09/21/21 0000 09/21/21 0521 09/21/21 0735 09/21/21 1155  BP:  117/72 129/60 103/60  Pulse:  66 (!) 59 62  Resp:  16 18 18   Temp:  98.2 F (36.8 C) 98.1 F (36.7 C) 98.1 F (36.7 C)  TempSrc:  Oral Oral Oral  SpO2: 92% 100% 92% 93%  Weight:      Height:        Intake/Output Summary (Last 24 hours) at 09/21/2021 1313 Last data filed at 09/21/2021 1211 Gross per 24 hour  Intake 650 ml  Output --  Net 650 ml   Filed Weights   09/16/21 1454  Weight: 95.6 kg    Examination:  General exam: Appears calm and comfortable  Respiratory system: Clear to auscultation. Respiratory effort normal. Cardiovascular system: S1 & S2 heard, RRR. No JVD, murmurs, rubs, gallops or clicks. No pedal edema. Gastrointestinal system: Abdomen is nondistended, soft and nontender. No organomegaly or masses  felt. Normal bowel sounds heard. Central nervous system: Alert and oriented x2. No focal neurological deficits. Extremities: Symmetric 5 x 5 power. Skin: No rashes, lesions or ulcers Psychiatry: Mood & affect appropriate.     Data Reviewed: I have personally reviewed following labs and imaging studies  CBC: Recent Labs  Lab 09/16/21 1507  09/17/21 0917 09/20/21 0408  WBC 2.3* 2.9* 2.6*  NEUTROABS 1.2* 1.4* 0.9*  HGB 8.2* 11.5* 11.2*  HCT 26.0* 35.6* 37.4  MCV 73.7* 72.5* 73.8*  PLT 117* 159 458   Basic Metabolic Panel: Recent Labs  Lab 09/16/21 1507 09/16/21 1618 09/17/21 0917 09/18/21 0441 09/19/21 0632  NA 130* 135 137 135 136  K 4.8 4.2 3.7 4.4 3.8  CL 99 104 107 103 105  CO2 22 18* 23 24 24   GLUCOSE 647* 358* 260* 341* 202*  BUN 20 18 9 10 13   CREATININE 1.22* 1.44* 0.92 1.10* 1.05*  CALCIUM 8.7* 9.0 8.4* 8.9 8.8*  MG  --   --  1.6* 1.9 1.7  PHOS  --   --  2.4* 3.2  --    GFR: Estimated Creatinine Clearance: 54.1 mL/min (A) (by C-G formula based on SCr of 1.05 mg/dL (H)). Liver Function Tests: Recent Labs  Lab 09/16/21 1507  AST 21  ALT 15  ALKPHOS 170*  BILITOT 1.3*  PROT 6.9  ALBUMIN 3.5   No results for input(s): LIPASE, AMYLASE in the last 168 hours. Recent Labs  Lab 09/16/21 1618  AMMONIA 33   Coagulation Profile: Recent Labs  Lab 09/16/21 1713  INR 1.1   Cardiac Enzymes: No results for input(s): CKTOTAL, CKMB, CKMBINDEX, TROPONINI in the last 168 hours. BNP (last 3 results) No results for input(s): PROBNP in the last 8760 hours. HbA1C: No results for input(s): HGBA1C in the last 72 hours. CBG: Recent Labs  Lab 09/20/21 1150 09/20/21 1619 09/20/21 2049 09/21/21 0743 09/21/21 1153  GLUCAP 247* 280* 199* 158* 213*   Lipid Profile: No results for input(s): CHOL, HDL, LDLCALC, TRIG, CHOLHDL, LDLDIRECT in the last 72 hours. Thyroid Function Tests: No results for input(s): TSH, T4TOTAL, FREET4, T3FREE, THYROIDAB in the last 72 hours. Anemia Panel: No results for input(s): VITAMINB12, FOLATE, FERRITIN, TIBC, IRON, RETICCTPCT in the last 72 hours. Sepsis Labs: Recent Labs  Lab 09/16/21 1618 09/16/21 1825 09/17/21 0917 09/17/21 1446  PROCALCITON  --  <0.10  --   --   LATICACIDVEN 6.4*  --  1.4 2.2*    Recent Results (from the past 240 hour(s))  Resp Panel by  RT-PCR (Flu A&B, Covid) Urine, Catheterized     Status: None   Collection Time: 09/16/21  5:14 PM   Specimen: Urine, Catheterized; Nasopharyngeal(NP) swabs in vial transport medium  Result Value Ref Range Status   SARS Coronavirus 2 by RT PCR NEGATIVE NEGATIVE Final    Comment: (NOTE) SARS-CoV-2 target nucleic acids are NOT DETECTED.  The SARS-CoV-2 RNA is generally detectable in upper respiratory specimens during the acute phase of infection. The lowest concentration of SARS-CoV-2 viral copies this assay can detect is 138 copies/mL. A negative result does not preclude SARS-Cov-2 infection and should not be used as the sole basis for treatment or other patient management decisions. A negative result may occur with  improper specimen collection/handling, submission of specimen other than nasopharyngeal swab, presence of viral mutation(s) within the areas targeted by this assay, and inadequate number of viral copies(<138 copies/mL). A negative result must be combined with clinical observations, patient history, and epidemiological  information. The expected result is Negative.  Fact Sheet for Patients:  EntrepreneurPulse.com.au  Fact Sheet for Healthcare Providers:  IncredibleEmployment.be  This test is no t yet approved or cleared by the [place] FDA and  has been authorized for detection and/or diagnosis of SARS-CoV-2 by FDA under an Emergency Use Authorization (EUA). This EUA will remain  in effect (meaning this test can be used) for the duration of the COVID-19 declaration under Section 564(b)(1) of the Act, 21 U.S.C.section 360bbb-3(b)(1), unless the authorization is terminated  or revoked sooner.       Influenza A by PCR NEGATIVE NEGATIVE Final   Influenza B by PCR NEGATIVE NEGATIVE Final    Comment: (NOTE) The Xpert Xpress SARS-CoV-2/FLU/RSV plus assay is intended as an aid in the diagnosis of influenza from Nasopharyngeal swab  specimens and should not be used as a sole basis for treatment. Nasal washings and aspirates are unacceptable for Xpert Xpress SARS-CoV-2/FLU/RSV testing.  Fact Sheet for Patients: EntrepreneurPulse.com.au  Fact Sheet for Healthcare Providers: IncredibleEmployment.be  This test is not yet approved or cleared by the [place] FDA and has been authorized for detection and/or diagnosis of SARS-CoV-2 by FDA under an Emergency Use Authorization (EUA). This EUA will remain in effect (meaning this test can be used) for the duration of the COVID-19 declaration under Section 564(b)(1) of the Act, 21 U.S.C. section 360bbb-3(b)(1), unless the authorization is terminated or revoked.  Performed at [facility], [address], [place], [place] [address]   Urine Culture     Status: None   Collection Time: 09/16/21  5:14 PM   Specimen: Urine, Random  Result Value Ref Range Status   Specimen Description   Final    URINE, RANDOM Performed at [facility], [address] [place], [place] [address]    Special Requests   Final    NONE Performed at [facility], [address], [place], [place] [address]    Culture   Final    NO GROWTH Performed at [facility], [place] [address], [place], [place] [address]    Report Status 09/17/2021 FINAL  Final  Blood culture (routine single)     Status: None   Collection Time: 09/16/21  5:36 PM   Specimen: BLOOD  Result Value Ref Range Status   Specimen Description BLOOD RIGHT ANTECUBITAL  Final   Special Requests   Final    BOTTLES DRAWN AEROBIC AND ANAEROBIC Blood Culture adequate volume   Culture   Final    NO GROWTH 5 DAYS Performed at [facility], [address] [place], [place] [address]    Report Status 09/21/2021 FINAL  Final  Respiratory (~20 pathogens) panel by PCR     Status: None   Collection Time: 09/18/21  1:45 PM   Specimen: Nasopharyngeal Swab;  Respiratory  Result Value Ref Range Status   Adenovirus NOT DETECTED NOT DETECTED Final   Coronavirus 229E NOT DETECTED NOT DETECTED Final    Comment: (NOTE) The Coronavirus on the Respiratory Panel, DOES NOT test for the novel  Coronavirus (2019 nCoV)    Coronavirus HKU1 NOT DETECTED NOT DETECTED Final   Coronavirus NL63 NOT DETECTED NOT DETECTED Final   Coronavirus OC43 NOT DETECTED NOT DETECTED Final   Metapneumovirus NOT DETECTED NOT DETECTED Final   Rhinovirus / Enterovirus NOT DETECTED NOT DETECTED Final   Influenza A NOT DETECTED NOT DETECTED Final   Influenza B NOT DETECTED NOT DETECTED Final   Parainfluenza Virus 1  NOT DETECTED NOT DETECTED Final   Parainfluenza Virus 2 NOT DETECTED NOT DETECTED Final   Parainfluenza Virus 3 NOT DETECTED NOT DETECTED Final   Parainfluenza Virus 4 NOT DETECTED NOT DETECTED Final   Respiratory Syncytial Virus NOT DETECTED NOT DETECTED Final   Bordetella pertussis NOT DETECTED NOT DETECTED Final   Bordetella Parapertussis NOT DETECTED NOT DETECTED Final   Chlamydophila pneumoniae NOT DETECTED NOT DETECTED Final   Mycoplasma pneumoniae NOT DETECTED NOT DETECTED Final    Comment: Performed at [facility], [place] [address], [place], [place] [address]         Radiology Studies: MR BRAIN W WO CONTRAST  Result Date: 09/19/2021 CLINICAL DATA:  Mental status change, unknown cause EXAM: MRI HEAD WITHOUT AND WITH CONTRAST TECHNIQUE: Multiplanar, multiecho pulse sequences of the brain and surrounding structures were obtained without and with intravenous contrast. CONTRAST:  15mL GADAVIST GADOBUTROL 1 MMOL/ML IV SOLN COMPARISON:  09/16/2021 CT head, no prior MRI head FINDINGS: Brain: No restricted diffusion to suggest acute infarct. No abnormal enhancement. No acute hemorrhage, mass, mass effect, or midline shift. Confluent T2 hyperintense signal in the periventricular white matter, likely the sequela of severe chronic small vessel ischemic disease.  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 Vascular: Normal flow voids. Skull and upper cervical spine: Normal marrow signal. Sinuses/Orbits: Mucosal thickening in the right greater than left maxillary sinus. Status post bilateral lens replacements. Other: Trace fluid in left mastoid tip. IMPRESSION: No acute intracranial process. Electronically Signed   By: Merilyn Baba M.D.   On: 09/19/2021 15:23        Scheduled Meds:  allopurinol  300 mg Oral Daily   amLODipine  10 mg Oral Daily   aspirin EC  81 mg Oral Daily   atorvastatin  10 mg Oral Daily   dapagliflozin propanediol  10 mg Oral Daily   enoxaparin (LOVENOX) injection  0.5 mg/kg Subcutaneous Q24H   ezetimibe  10 mg Oral Daily   insulin aspart  0-15 Units Subcutaneous TID WC & HS   insulin detemir  26 Units Subcutaneous BID   levothyroxine  50 mcg Oral Q0600   linaclotide  145 mcg Oral Daily   losartan  25 mg Oral Daily   mometasone-formoterol  2 puff Inhalation BID   pantoprazole  40 mg Oral Daily   QUEtiapine  50 mg Oral QHS   Continuous Infusions:   LOS: 4 days    Time spent: 22 minutes    Sharen Hones, MD [facility]   To contact the attending provider between 7A-7P or the covering provider during after hours 7P-7A, please log into the web site [email or web address] and access using universal [place] password for that web site. If you do not have the password, please call the hospital operator.  09/21/2021, 1:13 PM

## 2021-09-22 DIAGNOSIS — E1165 Type 2 diabetes mellitus with hyperglycemia: Secondary | ICD-10-CM | POA: Diagnosis not present

## 2021-09-22 DIAGNOSIS — R410 Disorientation, unspecified: Secondary | ICD-10-CM | POA: Diagnosis not present

## 2021-09-22 LAB — GLUCOSE, CAPILLARY
Glucose-Capillary: 108 mg/dL — ABNORMAL HIGH (ref 70–99)
Glucose-Capillary: 285 mg/dL — ABNORMAL HIGH (ref 70–99)
Glucose-Capillary: 255 mg/dL — ABNORMAL HIGH (ref 70–99)
Glucose-Capillary: 295 mg/dL — ABNORMAL HIGH (ref 70–99)

## 2021-09-22 MED ORDER — INSULIN DETEMIR 100 UNIT/ML ~~LOC~~ SOLN
30.0000 [IU] | Freq: Two times a day (BID) | SUBCUTANEOUS | Status: DC
Start: 2021-09-22 — End: 2021-09-24
  Administered 2021-09-22 – 2021-09-24 (×5): 30 [IU] via SUBCUTANEOUS
  Filled 2021-09-22 (×6): qty 0.3
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
## 2021-09-22 NOTE — Discharge Summary (Addendum)
Physician Discharge Summary  Chelsey [name] ID: Chelsey [name] MRN: [id number] DOB/AGE: [birth year] 70 y.o.  Admit date: 09/16/2021 Discharge date: 09/22/2021  Admission Diagnoses:  Discharge Diagnoses:  Principal Problem:   Acute metabolic encephalopathy Active Problems:   Hypothyroidism due to acquired atrophy of thyroid   Sarcoidosis of lung ([place])   Type 2 diab w hyprosm w/o nonket hyprgly-hypros coma [facility]) ([place])   Stage 3b chronic kidney disease ([place])   AMS (altered mental status)   Uncontrolled type 2 diabetes mellitus with hyperglycemia ([place])   Discharged Condition: good  Hospital Course:  Chelsey [name] is a 70 year old female with history of COPD, on nocturnal oxygen with obstructive sleep apnea, type 2 diabetes, coronary disease, essential hypertension, hypothyroidism, peripheral vascular disease, pulmonary sarcoidosis, history of seizures with history of brain aneurysm, rheumatoid arthritis to the hospital with altered mental status. I have discussed with Chelsey [name] and her sister this morning, she has been compliant with her insulin for diabetes, she does not have any recent sickness, no nausea vomiting or diarrhea. Was very confused yesterday, agitated arriving the emergency room.  Was given sedatives.  She had severe hyperglycemia with glucose more than 600, lactic acidosis of 6.0.  She was given fluids, she also received a insulin drip.  She does not have anion gap, no DKA.   Acute encephalopathy.  Most likely metabolic due to severe hyperglycemia. Uncontrolled diabetes with diabetes hyperosmolality with altered mental status. Severe lactic acidosis. Elevated troponin secondary to diabetes hyperosmolality. Seizure disorder. 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 Chelsey [name] glucose much better controlled. She slept better last night with the Seroquel, she has some confusion, but no agitation. MRI of the brain did not show any acute changes. Chelsey [name] probably has significant dementia with  delirium.  I will continue evening time Seroquel.  Chelsey [name] be followed by neurology as outpatient. I also did adjust Chelsey [name] insulin dose to Humulin 70/30 25 units twice a day.  Chelsey [name] probably was not using her insulin before admission due to mental confusion. Chelsey [name] was evaluated by psychiatry, she is deemed to lack decision making capacity.  Initially, she refused to go to her sister place.  As a result, we were looking for placement. After further discussion with the Chelsey [name] and her sister, Chelsey [name] is willing to go to her sisters place.  As result, she will be discharged today after clearing IVC by psychiatry. She will be also followed by PCP as outpatient   Acute kidney injury. Hypomagnesemia Mild hypophosphatemia. Condition had improved.  Sarcoidosis of the lungs. Follow-up with PCP as outpatient.  Obstruct sleep apnea. Continue nocturnal oxygen.   Leukopenia.  No neutropenia. Respiratory viral panel negative.         Consults: None  Significant Diagnostic Studies:  MRI HEAD WITHOUT AND WITH CONTRAST   TECHNIQUE: Multiplanar, multiecho pulse sequences of the brain and surrounding structures were obtained without and with intravenous contrast.   CONTRAST:  38m GADAVIST GADOBUTROL 1 MMOL/ML IV SOLN   COMPARISON:  09/16/2021 CT head, no prior MRI head   FINDINGS: Brain: No restricted diffusion to suggest acute infarct. No abnormal enhancement. No acute hemorrhage, mass, mass effect, or midline shift. Confluent T2 hyperintense signal in the periventricular white matter, likely the sequela of severe chronic small vessel ischemic disease. 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 Punctate foci of hemosiderin deposition in the right cerebellum, and bilateral thalami, likely sequela of prior hypertensive  microhemorrhages.    Vascular: Normal flow voids.   Skull and upper cervical spine: Normal marrow signal.   Sinuses/Orbits: Mucosal thickening in the right greater than left maxillary sinus. Status post bilateral lens replacements.   Other: Trace fluid in left mastoid tip.   IMPRESSION: No acute intracranial process.     Electronically Signed   By: Merilyn Baba M.D.   On: 09/19/2021 15:23       Treatments: Insulin  Discharge Exam: Blood pressure (!) 149/62, pulse 62, temperature 97.6 F (36.4 C), temperature source Oral, resp. rate 20, height _0  (1.6 m), weight 95.6 kg, SpO2 94 %. General appearance: alert and cooperative Resp: clear to auscultation bilaterally Cardio: regular rate and rhythm, S1, S2 normal, no murmur, click, rub or gallop GI: soft, non-tender; bowel sounds normal; no masses,  no organomegaly Extremities: extremities normal, atraumatic, no cyanosis or edema  Disposition: Discharge disposition: 01-Home or Self Care       Discharge Instructions     Diet - low sodium heart healthy   Complete by: As directed    Calorie controlled   Diet - low sodium heart healthy   Complete by: As directed    Increase activity slowly   Complete by: As directed    Increase activity slowly   Complete by: As directed       Allergies as of 09/22/2021       Reactions   Oxycodone-acetaminophen Other (See Comments)   Hallucinations Hallucinations HALLUCINATIONS Other reaction(s): Other (See Comments) HALLUCINATIONS Other reaction(s): Other (See Comments) Hallucinations Hallucinations HALLUCINATIONS Hallucinations Other reaction(s): Other (See Comments), Other (See Comments) HALLUCINATIONS Hallucinations Hallucinations HALLUCINATIONS Other reaction(s): Other (See Comments) HALLUCINATIONS Other reaction(s): Other (See Comments) Hallucinations Hallucinations HALLUCINATIONS Hallucinations Other reaction(s): Other (See Comments) HALLUCINATIONS Other reaction(s): Other  (See Comments) Hallucinations Hallucinations HALLUCINATIONS Hallucinations   Indomethacin Other (See Comments)   BURN HOLE IN STOMACH BURN HOLE IN STOMACH Other reaction(s): Other (See Comments), Other (See Comments) BURN HOLE IN STOMACH BURN HOLE IN STOMACH BURN HOLE IN STOMACH BURN HOLE IN STOMACH BURN HOLE IN STOMACH   Penicillins Rash        Medication List     STOP taking these medications    colchicine 0.6 MG tablet   hydrALAZINE 10 MG tablet Commonly known as: APRESOLINE   insulin lispro 100 UNIT/ML KwikPen Commonly known as: HUMALOG   potassium chloride SA 20 MEQ tablet Commonly known as: KLOR-CON   Tresiba FlexTouch 200 UNIT/ML FlexTouch Pen Generic drug: insulin degludec       TAKE these medications    Accu-Chek Aviva Plus w/Device Kit Use as directed.   albuterol 108 (90 Base) MCG/ACT inhaler Commonly known as: VENTOLIN HFA Inhale 2 puffs into the lungs every 6 (six) hours as needed for wheezing or shortness of breath.   allopurinol 300 MG tablet Commonly known as: ZYLOPRIM TAKE 1 TABLET BY MOUTH DAILY   amLODipine 10 MG tablet Commonly known as: NORVASC Take 1 tablet (10 mg total) by mouth daily.   aspirin EC 81 MG tablet Take 1 tablet (81 mg total) by mouth daily.   atorvastatin 10 MG tablet Commonly known as: LIPITOR TAKE 1 TABLET BY MOUTH DAILY FOR CHOLESTEROL   cilostazol 50 MG tablet Commonly known as: PLETAL TAKE 1 TABLET BY MOUTH TWICE A DAY   ezetimibe 10 MG tablet Commonly known as: ZETIA Take 1 tablet (10 mg total) by mouth daily. What changed:  how much to take when to take this  Farxiga 10 MG Tabs tablet Generic drug: dapagliflozin propanediol TAKE 1 TABLET BY MOUTH DAILY   Fluticasone-Salmeterol 250-50 MCG/DOSE Aepb Commonly known as: ADVAIR Inhale 1 puff into the lungs 2 (two) times daily.   gabapentin 300 MG capsule Commonly known as: NEURONTIN TAKE 1 CAPSULE BY MOUTH 3 TIMES A DAY   glucose blood test  strip Test sugar 4 x aday for uncontrolled dm on insulin E11.22   hydroxychloroquine 200 MG tablet Commonly known as: PLAQUENIL Take 1 tablet (200 mg total) by mouth 2 (two) times daily.   insulin isophane & regular human KwikPen (70-30) 100 UNIT/ML KwikPen Commonly known as: HUMULIN 70/30 MIX Inject 25 Units into the skin 2 (two) times daily.   Lancets Ultra Thin Misc Apply 1 each topically daily.   levothyroxine 50 MCG tablet Commonly known as: SYNTHROID TAKE 1 TABLET BY MOUTH IN THE MORNING ONAN EMPTY STOMACH FOR UNDERACTIVE THYROID   linaclotide 145 MCG Caps capsule Commonly known as: Linzess Take 1 capsule (145 mcg total) by mouth daily.   losartan 50 MG tablet Commonly known as: COZAAR TAKE 1 TABLET BY MOUTH DAILY FOR HYPENTENSION   omeprazole 20 MG capsule Commonly known as: PRILOSEC TAKE ONE CAPSULE BY MOUTH TWICE A DAY FOR GERD   OXYGEN Inhale 3 L into the lungs. Nighttime use   QUEtiapine 50 MG tablet Commonly known as: SEROQUEL Take 1 tablet (50 mg total) by mouth at bedtime.   torsemide 10 MG tablet Commonly known as: DEMADEX Take 10 mg by mouth daily.   UltiCare Short Pen Needles 31G X 8 MM Misc Generic drug: Insulin Pen Needle USE 3 TIMES A DAY        Follow-up Information     Lavera Guise, MD Follow up in 1 week(s).   Specialties: Internal Medicine, Cardiology Why: [place] to call and make appointment Contact information: [address] [place] [address] [phone]         [facility] Follow up in 2 week(s).   Why: Chelsey [name] to call and make appointment Contact information: [place] [phone]               More than 30 minutes Signed: Sharen Hones 09/22/2021, 1:55 PM

## 2021-09-22 NOTE — TOC Progression Note (Addendum)
Transition of Care [facility]) - Progression Note    Patient Details  Name: Chelsey [name] MRN: [id number] Date of Birth: 04-10-1951  Transition of Care [facility]) CM/SW [place], RN Phone Number: 09/22/2021, 12:06 PM  Clinical Narrative: During rounds Attending says patient will discharge home with sister. I did call sister to confirm no answer left voice message to return call. Attending notified.  2:00pm: Patient sister Rolin Barry, [phone] just called says she is unable to keep patient at her home due to patients violent behavior and she works during the day and do not feel that patient will be safe alone.     Expected Discharge Plan: Home/Self Care Barriers to Discharge: Other (must enter comment), Continued Medical Work up (pending psych re-evaluation due to patient refusing safe discharge plan.)  Expected Discharge Plan and Services Expected Discharge Plan: Home/Self Care   Discharge Planning Services: CM Consult     Expected Discharge Date: 09/20/21               DME Arranged: N/A DME Agency: NA                   Social Determinants of Health (SDOH) Interventions    Readmission Risk Interventions Readmission Risk Prevention Plan 09/19/2021  Transportation Screening Complete  PCP or Specialist Appt within 3-5 Days Complete  HRI or [place] Complete  Social Work Consult for [place] Planning/Counseling Complete  Palliative Care Screening Not Applicable  Medication Review Press photographer) Complete  Some recent data might be hidden

## 2021-09-22 NOTE — Progress Notes (Signed)
Social worker could not confirm sister willingness to take patient.  Not able to discharge again.

## 2021-09-22 NOTE — Progress Notes (Signed)
Inpatient Diabetes Program Recommendations  AACE/ADA: New Consensus Statement on Inpatient Glycemic Control (2015)  Target Ranges:  Prepandial:   less than 140 mg/dL      Peak postprandial:   less than 180 mg/dL (1-2 hours)      Critically ill patients:  140 - 180 mg/dL  Results for Chelsey [name], Chelsey [name] (MRN [id number]) as of 09/22/2021 07:55  Ref. Range 09/21/2021 07:43 09/21/2021 11:53 09/21/2021 16:07 09/21/2021 20:35 09/21/2021 21:47  Glucose-Capillary Latest Ref Range: 70 - 99 mg/dL 158 (H)  3 units Novolog  26 units Levemir  213 (H)  5 units Novolog  267 (H)  8 units Novolog  332 (H)  11 units Novolog  26 units Levemir  342 (H)     Home DM Meds: Per last visit on 04/10/2021 at [facility] Endocrinology Tresiba 90 units daily Humalog 32 units tid with meals   Current Orders: Levemir 30 units BID      Novolog Moderate Correction Scale/ SSI (0-15 units) TID AC + HS            Farxiga 10 mg daily    MD- Note Levemir insulin increased to 30 units BID this AM  Pt having more issues with afternoon CBGs and AM CBGs have seemed to be OK  Please consider:  1. Reduce Levemir back to 26 units BID  2. Start Novolog Meal Coverage: Novolog 6 units TID with meals Hold if pt eats <50% of meal, Hold if pt NPO    --Will follow patient during hospitalization--  Wyn Quaker RN, MSN, CDE Diabetes Coordinator Inpatient Glycemic Control Team Team Pager: [phone] (8a-5p)

## 2021-09-22 NOTE — Progress Notes (Signed)
Patient pleasant and cooperative overnight. Did become slightly anxious around midnight, undressing and wanting to leave. Redirected, explained care team would be rounding in the AM and can address concerns about discharge then. Patient stated she is unsure why we are keeping her here when she "can take care of herself" and that we are not doing anything for her here. Reinforced waiting for day team for decisions since it was the middle of the night, patient agreed, PRN trazodone given to aid with rest. 1:1 sitter for IVC maintained.

## 2021-09-23 LAB — GLUCOSE, CAPILLARY
Glucose-Capillary: 269 mg/dL — ABNORMAL HIGH (ref 70–99)
Glucose-Capillary: 295 mg/dL — ABNORMAL HIGH (ref 70–99)
Glucose-Capillary: 285 mg/dL — ABNORMAL HIGH (ref 70–99)
Glucose-Capillary: 183 mg/dL — ABNORMAL HIGH (ref 70–99)

## 2021-09-23 MED ORDER — INSULIN ASPART 100 UNIT/ML IJ SOLN
6.0000 [IU] | Freq: Three times a day (TID) | INTRAMUSCULAR | Status: DC
  Administered 2021-09-23 – 2021-09-25 (×7): 6 [IU] via SUBCUTANEOUS
  Filled 2021-09-23 (×7): qty 1

## 2021-09-23 NOTE — Progress Notes (Signed)
Patient fixated on need to get ready for a family member's funeral. Patient states she will be going to "be there for her momma and daddy." Both of which are deceased. Patient making several attempts to leave room/facility in order to "get to the house to get ready." When questioned further, patient states she will need her "black dress, nice shoes, good bra and make up." Patient reminded it was the middle of the night and nothing could be done about all that, but she could call her sister [name] around 0800 to see if she could bring her requested items.  Although it may take a couple attempts, patient is redirectable. Patient is unable to be fully reoriented. Patient has difficulty staying focused on one particular subject and frequently displays flight of ideas and rapid speech patterns. Denies hallucinations and does not appear to be responding to any internal stimuli at this time.

## 2021-09-23 NOTE — NC FL2 (Signed)
[place] LEVEL OF CARE SCREENING TOOL     IDENTIFICATION  Patient Name: Chelsey [name] Birthdate: January 13, 1951 Sex: female Admission Date (Current Location): 09/16/2021  [facility] and [place] Number:  Engineering geologist and Address:  [facility], [address], [place], [place] [address]      Provider Number: [id number]  Attending Physician Name and Address:  Sharen Hones, MD  Relative Name and Phone Number:  Hughie Closs [phone]    Current Level of Care: Hospital Recommended Level of Care: Memory Care Prior Approval Number:    Date Approved/Denied:   PASRR Number:    Discharge Plan: Other (Comment) (Memory Care)    Current Diagnoses: Patient Active Problem List   Diagnosis Date Noted   Uncontrolled type 2 diabetes mellitus with hyperglycemia ([place]) 80/01/4916   Acute metabolic encephalopathy 91/50/5697   AMS (altered mental status) 09/16/2021   Morbid obesity ([place]) 05/28/2021   Hyperparathyroidism due to renal insufficiency ([place]) 04/16/2020   Stage 3b chronic kidney disease ([place]) 04/16/2020   Complex renal cyst 10/18/2019   Urinary tract infection without hematuria 08/02/2019   Dysuria 08/02/2019   Essential hypertension, benign 08/02/2019   Right upper quadrant pain 12/07/2017   Iron deficiency anemia secondary to blood loss (chronic) 10/06/2017   Pain in right finger(s) 07/07/2017   Chronic pain of left wrist 03/04/2017   Pain in wrist 03/04/2017   Chronic pain of left ankle 11/03/2016   Chronic pain of left knee 11/03/2016   Right renal mass 11/03/2016   Hyperlipemia 07/29/2016   Recurrent ventral hernia    Ventral hernia without obstruction or gangrene 03/25/2016   Type 2 diab w hyprosm w/o nonket hyprgly-hypros coma [facility]) ([place])    Foot pain 12/10/2015   Chronic gouty arthropathy without tophi 12/10/2015   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/10/2015   Sarcoidosis of lung ([place]) 12/10/2015   Sarcoidosis,  nodular type 12/10/2015   Chronic foot pain, left 12/10/2015   Congenital pes planus of left foot 12/10/2015   Chronic idiopathic gout of multiple sites 08/05/2015   Sleep related hypoventilation/hypoxemia in other disease 04/29/2015   NAFLD (nonalcoholic fatty liver disease) 10/10/2014   Hypothyroidism due to acquired atrophy of thyroid 07/11/2014   Primary localized osteoarthrosis, lower leg 10/21/2012   Sarcoidosis 05/24/2008   Peripheral vascular disease ([place]) 02/15/2007    Orientation RESPIRATION BLADDER Height & Weight     Self, Time  Normal Continent Weight: 95.6 kg Height:  5\' 3"  (160 cm)  BEHAVIORAL SYMPTOMS/MOOD NEUROLOGICAL BOWEL NUTRITION STATUS  Wanderer, Verbally abusive, Other (Comment) (anxious at times, disagreeable)   Continent Diet  AMBULATORY STATUS COMMUNICATION OF NEEDS Skin   Independent Verbally Normal                       Personal Care Assistance Level of Assistance  Bathing, Feeding, Dressing Bathing Assistance: Limited assistance Feeding assistance: Independent Dressing Assistance: Independent     Functional Limitations Info  Sight, Hearing, Speech Sight Info: Adequate Hearing Info: Adequate Speech Info: Adequate    SPECIAL CARE FACTORS FREQUENCY                       Contractures Contractures Info: Not present    Additional Factors Info  Code Status, Allergies Code Status Info: Full Allergies Info: Full           Current Medications (09/23/2021):  This is the current hospital active medication list Current Facility-Administered  Medications  Medication Dose Route Frequency Provider Last Rate Last Admin   albuterol (PROVENTIL) (2.5 MG/3ML) 0.083% nebulizer solution 3 mL  3 mL Inhalation Q6H PRN Sharen Hones, MD       allopurinol (ZYLOPRIM) tablet 300 mg  300 mg Oral Daily Sharen Hones, MD   300 mg at 09/23/21 0831   amLODipine (NORVASC) tablet 10 mg  10 mg Oral Daily Sharen Hones, MD   10 mg at 09/23/21 8299   aspirin EC  tablet 81 mg  81 mg Oral Daily Sharen Hones, MD   81 mg at 09/23/21 0831   atorvastatin (LIPITOR) tablet 10 mg  10 mg Oral Daily Sharen Hones, MD   10 mg at 09/23/21 3716   dapagliflozin propanediol (FARXIGA) tablet 10 mg  10 mg Oral Daily Renda Rolls, RPH   10 mg at 09/23/21 0831   dextrose 50 % solution 0-50 mL  0-50 mL Intravenous PRN Para Skeans, MD       enoxaparin (LOVENOX) injection 47.5 mg  0.5 mg/kg Subcutaneous Q24H Sharen Hones, MD   47.5 mg at 09/22/21 2121   ezetimibe (ZETIA) tablet 10 mg  10 mg Oral Daily Sharen Hones, MD   10 mg at 09/23/21 0831   haloperidol lactate (HALDOL) injection 5 mg  5 mg Intramuscular Q6H PRN Sharen Hones, MD   5 mg at 09/23/21 0303   insulin aspart (novoLOG) injection 0-15 Units  0-15 Units Subcutaneous TID WC & HS Sharen Hones, MD   8 Units at 09/23/21 1227   insulin aspart (novoLOG) injection 6 Units  6 Units Subcutaneous TID WC Sharen Hones, MD   6 Units at 09/23/21 1227   insulin detemir (LEVEMIR) injection 30 Units  30 Units Subcutaneous BID Sharen Hones, MD   30 Units at 09/23/21 1023   levothyroxine (SYNTHROID) tablet 50 mcg  50 mcg Oral Q0600 Sharen Hones, MD   50 mcg at 09/23/21 0826   linaclotide (LINZESS) capsule 145 mcg  145 mcg Oral Daily Sharen Hones, MD   145 mcg at 09/23/21 0831   LORazepam (ATIVAN) injection 0.5 mg  0.5 mg Intravenous Q4H PRN Mansy, Jan A, MD   0.5 mg at 09/20/21 1317   losartan (COZAAR) tablet 25 mg  25 mg Oral Daily Sharen Hones, MD   25 mg at 09/23/21 9678   mometasone-formoterol (DULERA) 200-5 MCG/ACT inhaler 2 puff  2 puff Inhalation BID Sharen Hones, MD   2 puff at 09/23/21 1229   pantoprazole (PROTONIX) EC tablet 40 mg  40 mg Oral Daily Sharen Hones, MD   40 mg at 09/23/21 0832   phenol (CHLORASEPTIC) mouth spray 1 spray  1 spray Mouth/Throat PRN Sharen Hones, MD       QUEtiapine (SEROQUEL) tablet 50 mg  50 mg Oral QHS Sharen Hones, MD   50 mg at 09/22/21 2119   traZODone (DESYREL) tablet 25 mg  25 mg Oral QHS  PRN Mansy, Jan A, MD   25 mg at 09/23/21 0245     Discharge Medications: Please see discharge summary for a list of discharge medications.  Relevant Imaging Results:  Relevant Lab Results:   Additional Information    Shelbie Hutching, RN

## 2021-09-23 NOTE — TOC Progression Note (Signed)
Transition of Care [facility]) - Progression Note    Patient Details  Name: Chelsey [name] MRN: [id number] Date of Birth: [birth year]  [facility] CM/SW Contact  Shelbie Hutching, RN Phone Number: 09/23/2021, 3:16 PM  Clinical Narrative:    Since patient's family is unable to care for her long term placement will be needed.  TOC will start workup for Long term care ALF/memorycare.  Patient to qualify for memory care will need a qualifying diagnosis such as Dementia.     Expected Discharge Plan: Home/Self Care Barriers to Discharge: Other (must enter comment), Continued Medical Work up (pending psych re-evaluation due to patient refusing safe discharge plan.)  Expected Discharge Plan and Services Expected Discharge Plan: Home/Self Care   Discharge Planning Services: CM Consult     Expected Discharge Date: 09/22/21               DME Arranged: N/A DME Agency: NA                   Social Determinants of Health (SDOH) Interventions    Readmission Risk Interventions Readmission Risk Prevention Plan 09/19/2021  Transportation Screening Complete  PCP or Specialist Appt within 3-5 Days Complete  HRI or [place] Complete  Social Work Consult for [place] Planning/Counseling Complete  Palliative Care Screening Not Applicable  Medication Review Press photographer) Complete  Some recent data might be hidden

## 2021-09-23 NOTE — Progress Notes (Signed)
PROGRESS NOTE    Chelsey [name]  ZOX:[id number] DOB: [birth year] DOA: 09/16/2021 PCP: Lavera Guise, MD    Brief Narrative:   Chelsey [name] is a 70 year old female with history of COPD, on nocturnal oxygen with obstructive sleep apnea, type 2 diabetes, coronary disease, essential hypertension, hypothyroidism, peripheral vascular disease, pulmonary sarcoidosis, history of seizures with history of brain aneurysm, rheumatoid arthritis to the hospital with altered mental status.   She had severe hyperglycemia with glucose more than 600, lactic acidosis of 6.0.  She was given fluids, she also received a insulin drip.  She does not have anion gap, no DKA. Patient condition improved, but mental status did not improve.  MRI of the brain did not show significant abnormality.  Patient was deemed lacking decision-making capacity.  Currently pending for placement.  Assessment & Plan:   Principal Problem:   Acute metabolic encephalopathy Active Problems:   Hypothyroidism due to acquired atrophy of thyroid   Sarcoidosis of lung ([place])   Type 2 diab w hyprosm w/o nonket hyprgly-hypros coma [facility]) ([place])   Stage 3b chronic kidney disease ([place])   AMS (altered mental status)   Uncontrolled type 2 diabetes mellitus with hyperglycemia ([place])  Likely dementia with delirium. Patient condition still not improving, she is still confused, agitated.  She was taking Seroquel every night, she has intermittent insomnia. I will ask neurology to see her again to make a final diagnosis with the potential dementia.  Social worker is looking for memory unit.   Acute encephalopathy.  Most likely metabolic due to severe hyperglycemia. Uncontrolled diabetes with diabetes hyperosmolality with altered mental status. Severe lactic acidosis. Elevated troponin secondary to diabetes hyperosmolality. Condition has improved.  Currently patient is on Levemir 30 units twice a day.  Glucose still running high, add scheduled short  acting insulin in addition to sliding scale insulin.  Acute kidney injury. Hypomagnesemia Mild hypophosphatemia. Condition had improved.  Seizure disorder. No recurrence, continue home medicines.   Sarcoidosis of the lungs. Follow-up with PCP as outpatient.  Obstruct sleep apnea. Continue nocturnal oxygen.   Leukopenia.  No neutropenia. Respiratory viral panel negative.     DVT prophylaxis: Lovenox Code Status: full Family Communication:  Disposition Plan:    Status is: Inpatient  Remains inpatient appropriate because: Unsafe discharge.        I/O last 3 completed shifts: In: 85 [P.O.:990] Out: -  No intake/output data recorded.     Consultants:  Neurology, psychiatry  Procedures:   Antimicrobials: None  Subjective: Patient did not sleep well last night, she was confused, she states that she had to leave the hospital as she is going to a funeral. She is less confused this morning. Denies any abdominal pain or nausea vomiting. No fever or chills. No short of breath or cough.  Objective: Vitals:   09/22/21 1621 09/22/21 1911 09/23/21 0747 09/23/21 1116  BP: 137/64 (!) 118/46 (!) 141/73 125/69  Pulse: (!) 56 61 66 79  Resp: 18 16 16 20   Temp: 97.7 F (36.5 C) 97.8 F (36.6 C) (!) 97.4 F (36.3 C) (!) 97.5 F (36.4 C)  TempSrc: Oral  Oral Oral  SpO2: 98% 99% 100% 97%  Weight:      Height:        Intake/Output Summary (Last 24 hours) at 09/23/2021 1250 Last data filed at 09/22/2021 1457 Gross per 24 hour  Intake 480 ml  Output --  Net 480 ml   Filed Weights   09/16/21 1454  Weight: 95.6  kg    Examination:  General exam: Appears calm and comfortable  Respiratory system: Clear to auscultation. Respiratory effort normal. Cardiovascular system: S1 & S2 heard, RRR. No JVD, murmurs, rubs, gallops or clicks. No pedal edema. Gastrointestinal system: Abdomen is nondistended, soft and nontender. No organomegaly or masses felt. Normal bowel  sounds heard. Central nervous system: Alert and oriented. No focal neurological deficits. Extremities: Symmetric 5 x 5 power. Skin: No rashes, lesions or ulcers Psychiatry: Judgement and insight appear normal. Mood & affect appropriate.     Data Reviewed: I have personally reviewed following labs and imaging studies  CBC: Recent Labs  Lab 09/16/21 1507 09/17/21 0917 09/20/21 0408  WBC 2.3* 2.9* 2.6*  NEUTROABS 1.2* 1.4* 0.9*  HGB 8.2* 11.5* 11.2*  HCT 26.0* 35.6* 37.4  MCV 73.7* 72.5* 73.8*  PLT 117* 159 500   Basic Metabolic Panel: Recent Labs  Lab 09/16/21 1507 09/16/21 1618 09/17/21 0917 09/18/21 0441 09/19/21 0632  NA 130* 135 137 135 136  K 4.8 4.2 3.7 4.4 3.8  CL 99 104 107 103 105  CO2 22 18* 23 24 24   GLUCOSE 647* 358* 260* 341* 202*  BUN 20 18 9 10 13   CREATININE 1.22* 1.44* 0.92 1.10* 1.05*  CALCIUM 8.7* 9.0 8.4* 8.9 8.8*  MG  --   --  1.6* 1.9 1.7  PHOS  --   --  2.4* 3.2  --    GFR: Estimated Creatinine Clearance: 54.1 mL/min (A) (by C-G formula based on SCr of 1.05 mg/dL (H)). Liver Function Tests: Recent Labs  Lab 09/16/21 1507  AST 21  ALT 15  ALKPHOS 170*  BILITOT 1.3*  PROT 6.9  ALBUMIN 3.5   No results for input(s): LIPASE, AMYLASE in the last 168 hours. Recent Labs  Lab 09/16/21 1618  AMMONIA 33   Coagulation Profile: Recent Labs  Lab 09/16/21 1713  INR 1.1   Cardiac Enzymes: No results for input(s): CKTOTAL, CKMB, CKMBINDEX, TROPONINI in the last 168 hours. BNP (last 3 results) No results for input(s): PROBNP in the last 8760 hours. HbA1C: No results for input(s): HGBA1C in the last 72 hours. CBG: Recent Labs  Lab 09/22/21 1113 09/22/21 1627 09/22/21 2054 09/23/21 0748 09/23/21 1214  GLUCAP 285* 255* 295* 269* 295*   Lipid Profile: No results for input(s): CHOL, HDL, LDLCALC, TRIG, CHOLHDL, LDLDIRECT in the last 72 hours. Thyroid Function Tests: No results for input(s): TSH, T4TOTAL, FREET4, T3FREE, THYROIDAB in  the last 72 hours. Anemia Panel: No results for input(s): VITAMINB12, FOLATE, FERRITIN, TIBC, IRON, RETICCTPCT in the last 72 hours. Sepsis Labs: Recent Labs  Lab 09/16/21 1618 09/16/21 1825 09/17/21 0917 09/17/21 1446  PROCALCITON  --  <0.10  --   --   LATICACIDVEN 6.4*  --  1.4 2.2*    Recent Results (from the past 240 hour(s))  Resp Panel by RT-PCR (Flu A&B, Covid) Urine, Catheterized     Status: None   Collection Time: 09/16/21  5:14 PM   Specimen: Urine, Catheterized; Nasopharyngeal(NP) swabs in vial transport medium  Result Value Ref Range Status   SARS Coronavirus 2 by RT PCR NEGATIVE NEGATIVE Final    Comment: (NOTE) SARS-CoV-2 target nucleic acids are NOT DETECTED.  The SARS-CoV-2 RNA is generally detectable in upper respiratory specimens during the acute phase of infection. The lowest concentration of SARS-CoV-2 viral copies this assay can detect is 138 copies/mL. A negative result does not preclude SARS-Cov-2 infection and should not be used as the sole basis for  treatment or other patient management decisions. A negative result may occur with  improper specimen collection/handling, submission of specimen other than nasopharyngeal swab, presence of viral mutation(s) within the areas targeted by this assay, and inadequate number of viral copies(<138 copies/mL). A negative result must be combined with clinical observations, patient history, and epidemiological information. The expected result is Negative.  Fact Sheet for Patients:  EntrepreneurPulse.com.au  Fact Sheet for Healthcare Providers:  IncredibleEmployment.be  This test is no t yet approved or cleared by the [place] FDA and  has been authorized for detection and/or diagnosis of SARS-CoV-2 by FDA under an Emergency Use Authorization (EUA). This EUA will remain  in effect (meaning this test can be used) for the duration of the COVID-19 declaration under Section  564(b)(1) of the Act, 21 U.S.C.section 360bbb-3(b)(1), unless the authorization is terminated  or revoked sooner.       Influenza A by PCR NEGATIVE NEGATIVE Final   Influenza B by PCR NEGATIVE NEGATIVE Final    Comment: (NOTE) The Xpert Xpress SARS-CoV-2/FLU/RSV plus assay is intended as an aid in the diagnosis of influenza from Nasopharyngeal swab specimens and should not be used as a sole basis for treatment. Nasal washings and aspirates are unacceptable for Xpert Xpress SARS-CoV-2/FLU/RSV testing.  Fact Sheet for Patients: EntrepreneurPulse.com.au  Fact Sheet for Healthcare Providers: IncredibleEmployment.be  This test is not yet approved or cleared by the [place] FDA and has been authorized for detection and/or diagnosis of SARS-CoV-2 by FDA under an Emergency Use Authorization (EUA). This EUA will remain in effect (meaning this test can be used) for the duration of the COVID-19 declaration under Section 564(b)(1) of the Act, 21 U.S.C. section 360bbb-3(b)(1), unless the authorization is terminated or revoked.  Performed at [facility], [address], [place], [place] [address]   Urine Culture     Status: None   Collection Time: 09/16/21  5:14 PM   Specimen: Urine, Random  Result Value Ref Range Status   Specimen Description   Final    URINE, RANDOM Performed at [facility], [address], [place], [place] [address]    Special Requests   Final    NONE Performed at [facility], [address], [place], [place] [address]    Culture   Final    NO GROWTH Performed at [facility], [place] [address] [place], [place] [address]    Report Status 09/17/2021 FINAL  Final  Blood culture (routine single)     Status: None   Collection Time: 09/16/21  5:36 PM   Specimen: BLOOD  Result Value Ref Range Status   Specimen Description BLOOD RIGHT ANTECUBITAL  Final   Special Requests   Final     BOTTLES DRAWN AEROBIC AND ANAEROBIC Blood Culture adequate volume   Culture   Final    NO GROWTH 5 DAYS Performed at [facility], [address] [place], [place] [address]    Report Status 09/21/2021 FINAL  Final  Respiratory (~20 pathogens) panel by PCR     Status: None   Collection Time: 09/18/21  1:45 PM   Specimen: Nasopharyngeal Swab; Respiratory  Result Value Ref Range Status   Adenovirus NOT DETECTED NOT DETECTED Final   Coronavirus 229E NOT DETECTED NOT DETECTED Final    Comment: (NOTE) The Coronavirus on the Respiratory Panel, DOES NOT test for the novel  Coronavirus (2019 nCoV)    Coronavirus HKU1 NOT DETECTED NOT DETECTED Final   Coronavirus NL63 NOT  DETECTED NOT DETECTED Final   Coronavirus OC43 NOT DETECTED NOT DETECTED Final   Metapneumovirus NOT DETECTED NOT DETECTED Final   Rhinovirus / Enterovirus NOT DETECTED NOT DETECTED Final   Influenza A NOT DETECTED NOT DETECTED Final   Influenza B NOT DETECTED NOT DETECTED Final   Parainfluenza Virus 1 NOT DETECTED NOT DETECTED Final   Parainfluenza Virus 2 NOT DETECTED NOT DETECTED Final   Parainfluenza Virus 3 NOT DETECTED NOT DETECTED Final   Parainfluenza Virus 4 NOT DETECTED NOT DETECTED Final   Respiratory Syncytial Virus NOT DETECTED NOT DETECTED Final   Bordetella pertussis NOT DETECTED NOT DETECTED Final   Bordetella Parapertussis NOT DETECTED NOT DETECTED Final   Chlamydophila pneumoniae NOT DETECTED NOT DETECTED Final   Mycoplasma pneumoniae NOT DETECTED NOT DETECTED Final    Comment: Performed at [facility], [place] [address], [place], [place] [address]         Radiology Studies: No results found.      Scheduled Meds:  allopurinol  300 mg Oral Daily   amLODipine  10 mg Oral Daily   aspirin EC  81 mg Oral Daily   atorvastatin  10 mg Oral Daily   dapagliflozin propanediol  10 mg Oral Daily   enoxaparin (LOVENOX) injection  0.5 mg/kg Subcutaneous Q24H   ezetimibe  10 mg Oral Daily    insulin aspart  0-15 Units Subcutaneous TID WC & HS   insulin aspart  6 Units Subcutaneous TID WC   insulin detemir  30 Units Subcutaneous BID   levothyroxine  50 mcg Oral Q0600   linaclotide  145 mcg Oral Daily   losartan  25 mg Oral Daily   mometasone-formoterol  2 puff Inhalation BID   pantoprazole  40 mg Oral Daily   QUEtiapine  50 mg Oral QHS   Continuous Infusions:   LOS: 6 days    Time spent: 28 minutes    Sharen Hones, MD [facility]   To contact the attending provider between 7A-7P or the covering provider during after hours 7P-7A, please log into the web site [email or web address] and access using universal [place] password for that web site. If you do not have the password, please call the hospital operator.  09/23/2021, 12:50 PM

## 2021-09-24 LAB — GLUCOSE, CAPILLARY
Glucose-Capillary: 250 mg/dL — ABNORMAL HIGH (ref 70–99)
Glucose-Capillary: 239 mg/dL — ABNORMAL HIGH (ref 70–99)
Glucose-Capillary: 247 mg/dL — ABNORMAL HIGH (ref 70–99)
Glucose-Capillary: 178 mg/dL — ABNORMAL HIGH (ref 70–99)

## 2021-09-24 MED ORDER — INSULIN DETEMIR 100 UNIT/ML ~~LOC~~ SOLN
35.0000 [IU] | Freq: Two times a day (BID) | SUBCUTANEOUS | Status: DC
  Administered 2021-09-24 – 2021-09-26 (×4): 35 [IU] via SUBCUTANEOUS
  Filled 2021-09-24 (×6): qty 0.35

## 2021-09-24 NOTE — Progress Notes (Signed)
PROGRESS NOTE    Chelsey [name]  XBD:[id number] DOB: May 08, 1951 DOA: 09/16/2021 PCP: Lavera Guise, MD    Brief Narrative:   Chelsey [name] is a 70 year old female with history of COPD, on nocturnal oxygen with obstructive sleep apnea, type 2 diabetes, coronary disease, essential hypertension, hypothyroidism, peripheral vascular disease, pulmonary sarcoidosis, history of seizures with history of brain aneurysm, rheumatoid arthritis to the hospital with altered mental status.   She had severe hyperglycemia with glucose more than 600, lactic acidosis of 6.0.  She was given fluids, she also received a insulin drip.  She does not have anion gap, no DKA. Patient condition improved, but mental status did not improve.  MRI of the brain did not show significant abnormality.  Patient was deemed lacking decision-making capacity.  Currently pending for placement. 11/9 no overnight issues  Assessment & Plan:   Principal Problem:   Acute metabolic encephalopathy Active Problems:   Hypothyroidism due to acquired atrophy of thyroid   Sarcoidosis of lung ([place])   Type 2 diab w hyprosm w/o nonket hyprgly-hypros coma [facility]) ([place])   Stage 3b chronic kidney disease ([place])   AMS (altered mental status)   Uncontrolled type 2 diabetes mellitus with hyperglycemia ([place])  Likely dementia with delirium. Patient condition still not improving, she is still confused, agitated.  She was taking Seroquel every night, she has intermittent insomnia. I will ask neurology to see her again to make a final diagnosis with the potential dementia.  Social worker is looking for memory unit. 11/9 will need outpt neurology f/u   Acute encephalopathy.  Most likely metabolic due to severe hyperglycemia. Uncontrolled diabetes with diabetes hyperosmolality with altered mental status. Severe lactic acidosis. Elevated troponin secondary to diabetes hyperosmolality. Condition has improved.  11/9 bg elevated, will increase levemir to  35units bid. [place]   Acute kidney injury. Hypomagnesemia Mild hypophosphatemia. Has improved. Monitor periodically  Seizure disorder. No recurrence  Continue home meds     Sarcoidosis of the lungs. Follow-up with PCP as outpatient.  Obstruct sleep apnea. Continue nocturnal oxygen.   Leukopenia.  No neutropenia. Respiratory viral panel negative.     DVT prophylaxis: Lovenox Code Status: full Family Communication:  Disposition Plan: Unsafe discharge   Status is: Inpatient  Remains inpatient appropriate because: Unsafe discharge. Medically stable        I/O last 3 completed shifts: In: 340 [P.O.:340] Out: -  No intake/output data recorded.     Consultants:  Neurology, psychiatry  Procedures:   Antimicrobials: None  Subjective: Has no complaints this AM.  Sitter at bedside.  Denies shortness of breath or chest pain.  Objective: Vitals:   09/24/21 0058 09/24/21 0450 09/24/21 0745 09/24/21 1155  BP: 115/62 131/67 125/69 130/72  Pulse: (!) 58 (!) 58 (!) 57 74  Resp: 18 18 17 17   Temp: 98.5 F (36.9 C) 98.7 F (37.1 C) 97.7 F (36.5 C) 98.3 F (36.8 C)  TempSrc:    Oral  SpO2:  98% 98% 100%  Weight:      Height:        Intake/Output Summary (Last 24 hours) at 09/24/2021 1346 Last data filed at 09/23/2021 2000 Gross per 24 hour  Intake 100 ml  Output --  Net 100 ml   Filed Weights   09/16/21 1454  Weight: 95.6 kg    Examination: NAD, calm CTA no wheeze rales rhonchi's  regular S1-S2 no gallops Soft benign positive bowel sounds Aaxoxo2,not to person Grossly intact    Data Reviewed:  I have personally reviewed following labs and imaging studies  CBC: Recent Labs  Lab 09/20/21 0408  WBC 2.6*  NEUTROABS 0.9*  HGB 11.2*  HCT 37.4  MCV 73.8*  PLT 496   Basic Metabolic Panel: Recent Labs  Lab 09/18/21 0441 09/19/21 0632  NA 135 136  K 4.4 3.8  CL 103 105  CO2 24 24  GLUCOSE 341* 202*  BUN 10 13  CREATININE 1.10*  1.05*  CALCIUM 8.9 8.8*  MG 1.9 1.7  PHOS 3.2  --    GFR: Estimated Creatinine Clearance: 54.1 mL/min (A) (by C-G formula based on SCr of 1.05 mg/dL (H)). Liver Function Tests: No results for input(s): AST, ALT, ALKPHOS, BILITOT, PROT, ALBUMIN in the last 168 hours.  No results for input(s): LIPASE, AMYLASE in the last 168 hours. No results for input(s): AMMONIA in the last 168 hours.  Coagulation Profile: No results for input(s): INR, PROTIME in the last 168 hours.  Cardiac Enzymes: No results for input(s): CKTOTAL, CKMB, CKMBINDEX, TROPONINI in the last 168 hours. BNP (last 3 results) No results for input(s): PROBNP in the last 8760 hours. HbA1C: No results for input(s): HGBA1C in the last 72 hours. CBG: Recent Labs  Lab 09/23/21 1214 09/23/21 1557 09/23/21 2110 09/24/21 0818 09/24/21 1157  GLUCAP 295* 285* 183* 250* 239*   Lipid Profile: No results for input(s): CHOL, HDL, LDLCALC, TRIG, CHOLHDL, LDLDIRECT in the last 72 hours. Thyroid Function Tests: No results for input(s): TSH, T4TOTAL, FREET4, T3FREE, THYROIDAB in the last 72 hours. Anemia Panel: No results for input(s): VITAMINB12, FOLATE, FERRITIN, TIBC, IRON, RETICCTPCT in the last 72 hours. Sepsis Labs: No results for input(s): PROCALCITON, LATICACIDVEN in the last 168 hours.   Recent Results (from the past 240 hour(s))  Resp Panel by RT-PCR (Flu A&B, Covid) Urine, Catheterized     Status: None   Collection Time: 09/16/21  5:14 PM   Specimen: Urine, Catheterized; Nasopharyngeal(NP) swabs in vial transport medium  Result Value Ref Range Status   SARS Coronavirus 2 by RT PCR NEGATIVE NEGATIVE Final    Comment: (NOTE) SARS-CoV-2 target nucleic acids are NOT DETECTED.  The SARS-CoV-2 RNA is generally detectable in upper respiratory specimens during the acute phase of infection. The lowest concentration of SARS-CoV-2 viral copies this assay can detect is 138 copies/mL. A negative result does not preclude  SARS-Cov-2 infection and should not be used as the sole basis for treatment or other patient management decisions. A negative result may occur with  improper specimen collection/handling, submission of specimen other than nasopharyngeal swab, presence of viral mutation(s) within the areas targeted by this assay, and inadequate number of viral copies(<138 copies/mL). A negative result must be combined with clinical observations, patient history, and epidemiological information. The expected result is Negative.  Fact Sheet for Patients:  EntrepreneurPulse.com.au  Fact Sheet for Healthcare Providers:  IncredibleEmployment.be  This test is no t yet approved or cleared by the [place] FDA and  has been authorized for detection and/or diagnosis of SARS-CoV-2 by FDA under an Emergency Use Authorization (EUA). This EUA will remain  in effect (meaning this test can be used) for the duration of the COVID-19 declaration under Section 564(b)(1) of the Act, 21 U.S.C.section 360bbb-3(b)(1), unless the authorization is terminated  or revoked sooner.       Influenza A by PCR NEGATIVE NEGATIVE Final   Influenza B by PCR NEGATIVE NEGATIVE Final    Comment: (NOTE) The Xpert Xpress SARS-CoV-2/FLU/RSV plus assay is intended as an  aid in the diagnosis of influenza from Nasopharyngeal swab specimens and should not be used as a sole basis for treatment. Nasal washings and aspirates are unacceptable for Xpert Xpress SARS-CoV-2/FLU/RSV testing.  Fact Sheet for Patients: EntrepreneurPulse.com.au  Fact Sheet for Healthcare Providers: IncredibleEmployment.be  This test is not yet approved or cleared by the [place] FDA and has been authorized for detection and/or diagnosis of SARS-CoV-2 by FDA under an Emergency Use Authorization (EUA). This EUA will remain in effect (meaning this test can be used) for the duration of  the COVID-19 declaration under Section 564(b)(1) of the Act, 21 U.S.C. section 360bbb-3(b)(1), unless the authorization is terminated or revoked.  Performed at [facility], [address], [place], [place] [address]   Urine Culture     Status: None   Collection Time: 09/16/21  5:14 PM   Specimen: Urine, Random  Result Value Ref Range Status   Specimen Description   Final    URINE, RANDOM Performed at [facility], [address] [place], [place] [address]    Special Requests   Final    NONE Performed at [facility], [address], [place], [place] [address]    Culture   Final    NO GROWTH Performed at [facility], [place] [address], [place], [place] [address]    Report Status 09/17/2021 FINAL  Final  Blood culture (routine single)     Status: None   Collection Time: 09/16/21  5:36 PM   Specimen: BLOOD  Result Value Ref Range Status   Specimen Description BLOOD RIGHT ANTECUBITAL  Final   Special Requests   Final    BOTTLES DRAWN AEROBIC AND ANAEROBIC Blood Culture adequate volume   Culture   Final    NO GROWTH 5 DAYS Performed at [facility], [place] [place], [place] [address]    Report Status 09/21/2021 FINAL  Final  Respiratory (~20 pathogens) panel by PCR     Status: None   Collection Time: 09/18/21  1:45 PM   Specimen: Nasopharyngeal Swab; Respiratory  Result Value Ref Range Status   Adenovirus NOT DETECTED NOT DETECTED Final   Coronavirus 229E NOT DETECTED NOT DETECTED Final    Comment: (NOTE) The Coronavirus on the Respiratory Panel, DOES NOT test for the novel  Coronavirus (2019 nCoV)    Coronavirus HKU1 NOT DETECTED NOT DETECTED Final   Coronavirus NL63 NOT DETECTED NOT DETECTED Final   Coronavirus OC43 NOT DETECTED NOT DETECTED Final   Metapneumovirus NOT DETECTED NOT DETECTED Final   Rhinovirus / Enterovirus NOT DETECTED NOT DETECTED Final   Influenza A NOT DETECTED NOT DETECTED Final   Influenza B  NOT DETECTED NOT DETECTED Final   Parainfluenza Virus 1 NOT DETECTED NOT DETECTED Final   Parainfluenza Virus 2 NOT DETECTED NOT DETECTED Final   Parainfluenza Virus 3 NOT DETECTED NOT DETECTED Final   Parainfluenza Virus 4 NOT DETECTED NOT DETECTED Final   Respiratory Syncytial Virus NOT DETECTED NOT DETECTED Final   Bordetella pertussis NOT DETECTED NOT DETECTED Final   Bordetella Parapertussis NOT DETECTED NOT DETECTED Final   Chlamydophila pneumoniae NOT DETECTED NOT DETECTED Final   Mycoplasma pneumoniae NOT DETECTED NOT DETECTED Final    Comment: Performed at [facility], [place] [address] [place], [place] [address]         Radiology Studies: No results found.      Scheduled Meds:  allopurinol  300 mg Oral Daily   amLODipine  10  mg Oral Daily   aspirin EC  81 mg Oral Daily   atorvastatin  10 mg Oral Daily   dapagliflozin propanediol  10 mg Oral Daily   enoxaparin (LOVENOX) injection  0.5 mg/kg Subcutaneous Q24H   ezetimibe  10 mg Oral Daily   insulin aspart  0-15 Units Subcutaneous TID WC & HS   insulin aspart  6 Units Subcutaneous TID WC   insulin detemir  35 Units Subcutaneous BID   levothyroxine  50 mcg Oral Q0600   linaclotide  145 mcg Oral Daily   losartan  25 mg Oral Daily   mometasone-formoterol  2 puff Inhalation BID   pantoprazole  40 mg Oral Daily   QUEtiapine  50 mg Oral QHS   Continuous Infusions:   LOS: 7 days    Time spent: 35 minutes with more than 50% on [place], MD [facility]   To contact the attending provider between 7A-7P or the covering provider during after hours 7P-7A, please log into the web site [email or web address] and access using universal [place] password for that web site. If you do not have the password, please call the hospital operator.  09/24/2021, 1:46 PM

## 2021-09-24 NOTE — Progress Notes (Signed)
Inpatient Diabetes Program Recommendations  AACE/ADA: New Consensus Statement on Inpatient Glycemic Control (2015)  Target Ranges:  Prepandial:   less than 140 mg/dL      Peak postprandial:   less than 180 mg/dL (1-2 hours)      Critically ill patients:  140 - 180 mg/dL  Results for ANDREKA, STUCKI (MRN [id number]) as of 09/24/2021 10:22  Ref. Range 09/23/2021 07:48 09/23/2021 12:14 09/23/2021 15:57 09/23/2021 21:10  Glucose-Capillary Latest Ref Range: 70 - 99 mg/dL 269 (H)  8 units Novolog  295 (H)  14 units Novolog  3 units Levemir  285 (H)  14 units Novolog  183 (H)  3 units Novolog  30 units Levemir  Results for MERISA, JULIO (MRN [id number]) as of 09/24/2021 10:22  Ref. Range 09/24/2021 08:18  Glucose-Capillary Latest Ref Range: 70 - 99 mg/dL 250 (H)  11 units Novolog  30 units Levemir    Home DM Meds: Per last visit on 04/10/2021 at [facility] Endocrinology Tresiba 90 units daily Humalog 32 units tid with meals     Current Orders: Levemir 30 units BID                            Novolog Moderate Correction Scale/ SSI (0-15 units) TID AC + HS                            Farxiga 10 mg daily      Novolog 6 units TID with meals     MD- Note Novolog 6 units Meal Coverage started yesterday at 12pm w/ Lunch  CBGs remain elevated this AM   May also consider increasing the Levemir further to 35 units BID   --Will follow patient during hospitalization--  Wyn Quaker RN, MSN, CDE Diabetes Coordinator Inpatient Glycemic Control Team Team Pager: [phone] (8a-5p)

## 2021-09-24 NOTE — TOC Progression Note (Signed)
Transition of Care [facility]) - Progression Note    Patient Details  Name: Chelsey [name] MRN: [id number] Date of Birth: 01/04/1951  Transition of Care [facility]) CM/SW Contact  Shelbie Hutching, RN Phone Number: 09/24/2021, 4:01 PM  Clinical Narrative:    Patient's sister [name] wanted to discuss discharge planning, she believes that the patient will be okay to return home, she does not want her to go to and Assisted living or memory care.  Patient has PCS 80 hours per week, which is the max.  They go out for several hours every day 7 days a week.  Patient also has a peer support person [name] [phone] from [facility] in [place].  They help patient get to appointments and medication adherence.   Referral for home health services given to [facility] with [facility] for RN for medication management.  RNCM also reached out to diabetes coordinator about a new continuous glucose monitoring device as her is not working at home.     Expected Discharge Plan: [place] Services Barriers to Discharge: Other (must enter comment), Continued Medical Work up (pending psych re-evaluation due to patient refusing safe discharge plan.)  Expected Discharge Plan and Services Expected Discharge Plan: [place]   Discharge Planning Services: CM Consult Post Acute Care Choice: [place] arrangements for the past 2 months: Apartment Expected Discharge Date: 09/22/21               DME Arranged: N/A DME Agency: NA       HH Arranged: RN [place] Agency: [place] ([place]) Date HH Agency Contacted: 09/24/21 Time [place]: 1200 Representative spoke with at [place]: [place] ([place]) Interventions    Readmission Risk Interventions Readmission Risk Prevention Plan 09/19/2021  Transportation Screening Complete  PCP or Specialist Appt within 3-5 Days Complete  HRI or [place] Complete  Social Work  Consult for [place] Planning/Counseling Complete  Palliative Care Screening Not Applicable  Medication Review Press photographer) Complete  Some recent data might be hidden

## 2021-09-24 NOTE — ED Provider Notes (Signed)
.  Critical Care Performed by: Lucrezia Starch, MD Authorized by: Lucrezia Starch, MD   Critical care provider statement:    Critical care time (minutes):  45   Critical care was necessary to treat or prevent imminent or life-threatening deterioration of the following conditions:  CNS failure or compromise and endocrine crisis   Critical care was time spent personally by me on the following activities:  Review of old charts, pulse oximetry, ordering and review of radiographic studies, ordering and review of laboratory studies, ordering and performing treatments and interventions, development of treatment plan with patient or surrogate, evaluation of patient's response to treatment and examination of patient   I assumed direction of critical care for this patient from another provider in my specialty: no     Care discussed with: admitting provider      Lucrezia Starch, MD 09/24/21 2125

## 2021-09-25 DIAGNOSIS — R410 Disorientation, unspecified: Secondary | ICD-10-CM | POA: Diagnosis not present

## 2021-09-25 LAB — GLUCOSE, CAPILLARY
Glucose-Capillary: 166 mg/dL — ABNORMAL HIGH (ref 70–99)
Glucose-Capillary: 304 mg/dL — ABNORMAL HIGH (ref 70–99)
Glucose-Capillary: 144 mg/dL — ABNORMAL HIGH (ref 70–99)
Glucose-Capillary: 116 mg/dL — ABNORMAL HIGH (ref 70–99)

## 2021-09-25 MED ORDER — INSULIN ASPART 100 UNIT/ML IJ SOLN
10.0000 [IU] | Freq: Three times a day (TID) | INTRAMUSCULAR | Status: DC
  Administered 2021-09-25 – 2021-09-26 (×2): 10 [IU] via SUBCUTANEOUS
  Filled 2021-09-25: qty 1

## 2021-09-25 NOTE — Consult Note (Signed)
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 female patient admitted with "I am fine, can I go home?".  HPI: Patient seen and chart reviewed.  Patient was last seen by me earlier in her hospital stay at which time she was very agitated and cursed me and threw me out of her room.  During her hospital stay she has apparently been prescribed 50 mg of Seroquel orally at night and that is still on her medicine list.  She has no complaint today.  Not reporting any mood symptoms not reporting psychotic symptoms not agitated or hostile.  Past Psychiatric History: Patient denied any knowledge of having been on Seroquel in the past.  Even though it is listed in her reconciliation as an outpatient medicine I do not see any evidence that she was on it before this lengthy hospital stay started.  There is no known past psychiatric diagnosis or treatment  Risk to Self:   Risk to Others:   Prior Inpatient Therapy:   Prior Outpatient Therapy:    Past Medical History:  Past Medical History:  Diagnosis Date   Anemia    Aneurysm (HCC)    Arthritis    RHEUMATOID ARTHRITIS   Asthma    Collagen vascular disease (HCC)    COPD  (chronic obstructive pulmonary disease) ([place])    Coronary artery disease    Diabetes mellitus without complication (HCC)    Edema    FEET/LEGS   GERD (gastroesophageal reflux disease)    Gout    H/O wheezing    History of hiatal hernia    Hypertension    Hypothyroidism    Neuropathy    Oxygen deficiency    HS   Peripheral vascular disease (HCC)    Sarcoidosis of lung (HCC)    Seizures (HCC)    WITH BRAIN 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    Sleep apnea    OXYGEN AT NIGHT 3 L [place]    Past Surgical History:  Procedure Laterality Date   BRAIN SURGERY  1990's   CATARACT EXTRACTION W/PHACO Left 06/11/2016   Procedure: CATARACT EXTRACTION PHACO AND INTRAOCULAR LENS PLACEMENT (IOC);  Surgeon: Birder Robson, MD;  Location: [facility] ORS;  Service: Ophthalmology;  Laterality: Left;  [place] 1.01 AP% 21.6 CDE 13.23 Fluid pack lot # [id number]   CEREBRAL ANEURYSM REPAIR  1990's   COILS   CORONARY ARTERY BYPASS GRAFT     EYE SURGERY  2000's   bilateral cataract   FRACTURE SURGERY     HERNIA REPAIR  2000   ventral   STENT PLACEMENT VASCULAR ([place] HX)     VEIN BYPASS SURGERY     VENTRAL HERNIA REPAIR N/A 04/29/2016   Procedure: Laparoscopic HERNIA REPAIR VENTRAL ADULT;  Surgeon: Jules Husbands, MD;  Location: [facility] ORS;  Service: General;  Laterality: N/A;   Family History:  Family History  Problem Relation Age of Onset   Heart disease Mother    Hypertension Mother    Diabetes Mother    Diabetes Father    Heart disease Father    Hypertension Father    Breast cancer Maternal Aunt    Family Psychiatric  History: None reported Social History:  Social History   Substance and Sexual Activity  Alcohol Use No   Alcohol/week: 0.0 standard drinks     Social History   Substance and Sexual Activity  Drug Use No    Social History   Socioeconomic History   Marital status: Single    Spouse name: Not on file   Number of children: Not on file   Years of education: Not on file   Highest education  level: Not on file  Occupational History   Not on file  Tobacco Use   Smoking status: Former    Packs/day: 1.00    Years: 30.00    Pack years: 30.00    Types: Cigarettes    Quit date: 01/27/2006    Years since quitting: 15.6   Smokeless tobacco: Never   Tobacco comments:    quit   Substance and Sexual Activity   Alcohol use: No    Alcohol/week: 0.0 standard drinks   Drug use: No   Sexual activity: Not on file  Other Topics Concern   Not on file  Social History Narrative   Not on file   Social Determinants of Health   Financial Resource Strain: Not on file  Food Insecurity: Not on file  Transportation Needs: Not on file  Physical Activity: Not on file  Stress: Not on file  Social Connections: Not on file   Additional Social History:    Allergies:   Allergies  Allergen Reactions   Oxycodone-Acetaminophen Other (See Comments)    Hallucinations Hallucinations HALLUCINATIONS Other reaction(s): Other (See Comments) HALLUCINATIONS Other reaction(s): Other (See Comments) Hallucinations Hallucinations HALLUCINATIONS Hallucinations Other reaction(s): Other (See Comments), Other (See Comments) HALLUCINATIONS Hallucinations Hallucinations HALLUCINATIONS Other reaction(s): Other (See Comments) HALLUCINATIONS Other reaction(s): Other (See Comments) Hallucinations Hallucinations HALLUCINATIONS Hallucinations Other reaction(s): Other (See Comments) HALLUCINATIONS Other reaction(s): Other (See Comments) Hallucinations Hallucinations HALLUCINATIONS Hallucinations    Indomethacin Other (See Comments)    BURN HOLE IN STOMACH BURN HOLE IN STOMACH Other reaction(s): Other (See Comments), Other (See Comments) BURN HOLE IN STOMACH BURN HOLE IN STOMACH BURN HOLE IN STOMACH BURN HOLE IN STOMACH BURN HOLE IN STOMACH    Penicillins Rash    Labs:  Results for orders placed or performed during the hospital encounter of 09/16/21 (from the past 48 hour(s))   Glucose, capillary     Status: Abnormal   Collection Time: 09/23/21  3:57 PM  Result Value Ref Range   Glucose-Capillary 285 (H) 70 - 99 mg/dL    Comment: Glucose reference range applies only to samples taken after fasting for at least 8 hours.  Glucose, capillary     Status: Abnormal   Collection Time: 09/23/21  9:10 PM  Result Value Ref Range   Glucose-Capillary 183 (H) 70 - 99 mg/dL    Comment: Glucose reference range applies only to samples taken after fasting for at least 8 hours.   Comment 1 Notify RN   Glucose, capillary     Status: Abnormal   Collection Time: 09/24/21  8:18 AM  Result Value Ref Range   Glucose-Capillary 250 (H) 70 -  99 mg/dL    Comment: Glucose reference range applies only to samples taken after fasting for at least 8 hours.  Glucose, capillary     Status: Abnormal   Collection Time: 09/24/21 11:57 AM  Result Value Ref Range   Glucose-Capillary 239 (H) 70 - 99 mg/dL    Comment: Glucose reference range applies only to samples taken after fasting for at least 8 hours.  Glucose, capillary     Status: Abnormal   Collection Time: 09/24/21  4:52 PM  Result Value Ref Range   Glucose-Capillary 247 (H) 70 - 99 mg/dL    Comment: Glucose reference range applies only to samples taken after fasting for at least 8 hours.  Glucose, capillary     Status: Abnormal   Collection Time: 09/24/21  7:48 PM  Result Value Ref Range   Glucose-Capillary 178 (H) 70 - 99 mg/dL    Comment: Glucose reference range applies only to samples taken after fasting for at least 8 hours.  Glucose, capillary     Status: Abnormal   Collection Time: 09/25/21  7:50 AM  Result Value Ref Range   Glucose-Capillary 166 (H) 70 - 99 mg/dL    Comment: Glucose reference range applies only to samples taken after fasting for at least 8 hours.  Glucose, capillary     Status: Abnormal   Collection Time: 09/25/21 11:55 AM  Result Value Ref Range   Glucose-Capillary 304 (H) 70 - 99 mg/dL    Comment: Glucose  reference range applies only to samples taken after fasting for at least 8 hours.    Current Facility-Administered Medications  Medication Dose Route Frequency Provider Last Rate Last Admin   albuterol (PROVENTIL) (2.5 MG/3ML) 0.083% nebulizer solution 3 mL  3 mL Inhalation Q6H PRN Sharen Hones, MD       allopurinol (ZYLOPRIM) tablet 300 mg  300 mg Oral Daily Sharen Hones, MD   300 mg at 09/25/21 0913   amLODipine (NORVASC) tablet 10 mg  10 mg Oral Daily Sharen Hones, MD   10 mg at 09/25/21 0913   aspirin EC tablet 81 mg  81 mg Oral Daily Sharen Hones, MD   81 mg at 09/25/21 0913   atorvastatin (LIPITOR) tablet 10 mg  10 mg Oral Daily Sharen Hones, MD   10 mg at 09/25/21 0913   dapagliflozin propanediol (FARXIGA) tablet 10 mg  10 mg Oral Daily Renda Rolls, RPH   10 mg at 09/25/21 0912   dextrose 50 % solution 0-50 mL  0-50 mL Intravenous PRN Para Skeans, MD       enoxaparin (LOVENOX) injection 47.5 mg  0.5 mg/kg Subcutaneous Q24H Sharen Hones, MD   47.5 mg at 09/24/21 2220   ezetimibe (ZETIA) tablet 10 mg  10 mg Oral Daily Sharen Hones, MD   10 mg at 09/25/21 0912   insulin aspart (novoLOG) injection 0-15 Units  0-15 Units Subcutaneous TID WC & HS Sharen Hones, MD   11 Units at 09/25/21 1222   insulin aspart (novoLOG) injection 10 Units  10 Units Subcutaneous TID WC Nolberto Hanlon, MD       insulin detemir (LEVEMIR) injection 35 Units  35 Units Subcutaneous BID Nolberto Hanlon, MD   35 Units at 09/25/21 0912   levothyroxine (SYNTHROID) tablet 50 mcg  50 mcg Oral Q0600 Sharen Hones, MD   50 mcg at 09/25/21 0600   linaclotide (LINZESS) capsule 145 mcg  145 mcg Oral Daily Sharen Hones, MD   145 mcg at 09/25/21  0912   LORazepam (ATIVAN) injection 0.5 mg  0.5 mg Intravenous Q4H PRN Mansy, Jan A, MD   0.5 mg at 09/20/21 1317   losartan (COZAAR) tablet 25 mg  25 mg Oral Daily Sharen Hones, MD   25 mg at 09/25/21 0913   mometasone-formoterol (DULERA) 200-5 MCG/ACT inhaler 2 puff  2 puff Inhalation BID  Sharen Hones, MD   2 puff at 09/25/21 0914   pantoprazole (PROTONIX) EC tablet 40 mg  40 mg Oral Daily Sharen Hones, MD   40 mg at 09/25/21 0913   phenol (CHLORASEPTIC) mouth spray 1 spray  1 spray Mouth/Throat PRN Sharen Hones, MD       traZODone (DESYREL) tablet 25 mg  25 mg Oral QHS PRN Mansy, Jan A, MD   25 mg at 09/24/21 2012    Musculoskeletal: Strength & Muscle Tone: within normal limits Gait & Station: normal Patient leans: N/A            Psychiatric Specialty Exam:  Presentation  General Appearance: No data recorded Eye Contact:No data recorded Speech:No data recorded Speech Volume:No data recorded Handedness:No data recorded  Mood and Affect  Mood:No data recorded Affect:No data recorded  Thought Process  Thought Processes:No data recorded Descriptions of Associations:No data recorded Orientation:No data recorded Thought Content:No data recorded History of Schizophrenia/Schizoaffective disorder:No data recorded Duration of Psychotic Symptoms:No data recorded Hallucinations:No data recorded Ideas of Reference:No data recorded Suicidal Thoughts:No data recorded Homicidal Thoughts:No data recorded  Sensorium  Memory:No data recorded Judgment:No data recorded Insight:No data recorded  Executive Functions  Concentration:No data recorded Attention Span:No data recorded Recall:No data recorded Fund of Knowledge:No data recorded Language:No data recorded  Psychomotor Activity  Psychomotor Activity:No data recorded  Assets  Assets:No data recorded  Sleep  Sleep:No data recorded  Physical Exam: Physical Exam Constitutional:      Appearance: Normal appearance.  HENT:     Head: Normocephalic and atraumatic.     Mouth/Throat:     Pharynx: Oropharynx is clear.  Eyes:     Pupils: Pupils are equal, round, and reactive to light.  Cardiovascular:     Rate and Rhythm: Normal rate and regular rhythm.  Pulmonary:     Effort: Pulmonary effort is  normal.     Breath sounds: Normal breath sounds.  Abdominal:     General: Abdomen is flat.     Palpations: Abdomen is soft.  Musculoskeletal:        General: Normal range of motion.  Skin:    General: Skin is warm and dry.  Neurological:     General: No focal deficit present.     Mental Status: She is alert. Mental status is at baseline.  Psychiatric:        Attention and Perception: Attention normal.        Mood and Affect: Affect is blunt.        Behavior: Behavior is cooperative.        Thought Content: Thought content normal.        Cognition and Memory: Memory is impaired.   Review of Systems  Constitutional: Negative.   HENT: Negative.    Eyes: Negative.   Respiratory: Negative.    Cardiovascular: Negative.   Gastrointestinal: Negative.   Musculoskeletal: Negative.   Skin: Negative.   Neurological: Negative.   Psychiatric/Behavioral: Negative.    Blood pressure (!) 131/57, pulse 65, temperature 98.4 F (36.9 C), temperature source Oral, resp. rate 17, height 5\' 3"  (1.6 m), weight 95.6 kg, SpO2 90 %.  Body mass index is 37.34 kg/m.  Treatment Plan Summary: Plan patient currently appears to be stable psychiatrically asymptomatic.  I do not see any reason right now to continue the Seroquel so I have discontinued it.  This removes all of the strictly psychiatric medicines that she is taking.  It looks like she still has a as needed of trazodone for sleep which could be either continued or not.  No other change to treatment and no need for specific psychiatric follow-up as she is going to some sort of assisted living.  Disposition: No evidence of imminent risk to self or others at present.   Patient does not meet criteria for psychiatric inpatient admission. Supportive therapy provided about ongoing stressors.  Alethia Berthold, MD 09/25/2021 1:41 PM

## 2021-09-25 NOTE — Progress Notes (Signed)
Inpatient Diabetes Program Recommendations  AACE/ADA: New Consensus Statement on Inpatient Glycemic Control (2015)  Target Ranges:  Prepandial:   less than 140 mg/dL      Peak postprandial:   less than 180 mg/dL (1-2 hours)      Critically ill patients:  140 - 180 mg/dL   Lab Results  Component Value Date   GLUCAP 304 (H) 09/25/2021   HGBA1C 12.8 (H) 09/16/2021    Review of Glycemic Control Results for Chelsey [name], Chelsey [name] (MRN [id number]) as of 09/25/2021 12:38  Ref. Range 09/24/2021 11:57 09/24/2021 16:52 09/24/2021 19:48 09/25/2021 07:50 09/25/2021 11:55  Glucose-Capillary Latest Ref Range: 70 - 99 mg/dL 239 (H) 247 (H) 178 (H) 166 (H) 304 (H)   Diabetes history: DM 2 Home DM Meds: Per last visit on 04/10/2021 at [facility] Endocrinology Tresiba 90 units daily Humalog 32 units tid with meals   Current Orders: Levemir 35 units BID                            Novolog Moderate Correction Scale/ SSI (0-15 units) TID AC + HS                            Farxiga 10 mg daily                            Novolog 6 units TID with meals Inpatient Diabetes Program Recommendations:   Please consider increasing Novolog meal coverage to 8 units tid with meals.  Thanks,  Adah Perl, RN, BC-ADM Inpatient Diabetes Coordinator Pager [phone]   (8a-5p)

## 2021-09-25 NOTE — TOC Progression Note (Signed)
Transition of Care [facility]) - Progression Note    Patient Details  Name: Chelsey [name] MRN: [id number] Date of Birth: [birth year]  [facility] CM/SW Contact  Shelbie Hutching, RN Phone Number: 09/25/2021, 10:32 AM  Clinical Narrative:    [facility] accepted referral for [facility] RN.  Patient will get 80 hours a month of PCS, [facility] comes out every day of the week for 2-3 hours.  Patient gets Meals on Wheels and has a peer support.  Sister [name] would like for [facility] to work with her on teaching her how to give the insulin injections so she can help patient at home.   IVC discontinued, MD wants to get psychiatry to see patient again before discharge.  Neuro is also going to see patient today.  Potential for DC tomorrow.    Expected Discharge Plan: [place] Services Barriers to Discharge: Other (must enter comment), Continued Medical Work up (pending psych re-evaluation due to patient refusing safe discharge plan.)  Expected Discharge Plan and Services Expected Discharge Plan: [place]   Discharge Planning Services: CM Consult Post Acute Care Choice: [place] arrangements for the past 2 months: Apartment Expected Discharge Date: 09/22/21               DME Arranged: N/A DME Agency: NA       HH Arranged: RN [place] Agency: [place] ([place]) Date HH Agency Contacted: 09/24/21 Time [place]: 1200 Representative spoke with at [place]: [place] ([place]) Interventions    Readmission Risk Interventions Readmission Risk Prevention Plan 09/19/2021  Transportation Screening Complete  PCP or Specialist Appt within 3-5 Days Complete  HRI or [place] Complete  Social Work Consult for [place] Planning/Counseling Complete  Palliative Care Screening Not Applicable  Medication Review Press photographer) Complete  Some recent data might be hidden

## 2021-09-25 NOTE — Progress Notes (Signed)
PROGRESS NOTE    Chelsey [name]  GUR:[id number] DOB: 1951-01-03 DOA: 09/16/2021 PCP: Lavera Guise, MD    Brief Narrative:   Chelsey [name] is a 70 year old female with history of COPD, on nocturnal oxygen with obstructive sleep apnea, type 2 diabetes, coronary disease, essential hypertension, hypothyroidism, peripheral vascular disease, pulmonary sarcoidosis, history of seizures with history of brain aneurysm, rheumatoid arthritis to the hospital with altered mental status.   She had severe hyperglycemia with glucose more than 600, lactic acidosis of 6.0.  She was given fluids, she also received a insulin drip.  She does not have anion gap, no DKA. Patient condition improved, but mental status did not improve.  MRI of the brain did not show significant abnormality.  Patient was deemed lacking decision-making capacity.  Currently pending for placement. 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 Patient condition still not improving, she is still confused, agitated.  She was taking Seroquel every night, she has intermittent insomnia. I will ask neurology to see her again to make a final diagnosis with the potential dementia.  Social worker is looking for memory unit. 11/10 Will need outpatient neurology.   DC Haldol   Acute encephalopathy.  Most likely metabolic due to severe hyperglycemia. Uncontrolled diabetes with diabetes hyperosmolality with altered mental status. Severe lactic acidosis. Elevated troponin secondary to diabetes hyperosmolality. Condition has improved.  11/10 BG elevated in  300's. Will increase meal novolog to 10units tid Continue levemir Continue Riss  EEG revealed focal slowing over the left temporal region indicating focal cerebral dysfunction in that area. No electrographic seizures were seen. 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  Have asked psych to come see patient and give recommendations on which med to send pt home with  Acute kidney injury. Hypomagnesemia Mild hypophosphatemia. Has improved Monitor periodically   Seizure disorder. No recurrence continue  Continue meds   Sarcoidosis of the lungs. Follow-up with PCP as outpatient.  Obstruct sleep apnea. Continue nocturnal oxygen.   Leukopenia.  No neutropenia. Respiratory viral panel negative.     DVT prophylaxis: Lovenox Code Status: full Family Communication:  Disposition Plan: Unsafe discharge   Status is: Inpatient  Remains inpatient appropriate because: Unsafe discharge. Medically stable        I/O last 3 completed shifts: In: 200 [P.O.:200] Out: -  No intake/output data recorded.     Consultants:  Neurology, psychiatry  Procedures:   Antimicrobials: None  Subjective: Has no complaints. Ate breakfast. No sob, cp  Objective: Vitals:   09/24/21 0745 09/24/21 1155 09/24/21 1951 09/25/21 0407  BP: 125/69 130/72 133/64 (!) 151/68  Pulse: (!) 57 74 64 67  Resp: 17 17 20 20   Temp: 97.7 F (36.5 C) 98.3 F (36.8 C) (!) 97.5 F (36.4 C) 98 F (36.7 C)  TempSrc:  Oral Oral Oral  SpO2: 98% 100% 100% 93%  Weight:      Height:        Intake/Output Summary (Last 24 hours) at 09/25/2021 2831 Last data filed at 09/24/2021 2230 Gross  per 24 hour  Intake 100 ml  Output --  Net 100 ml   Filed Weights   09/16/21 1454  Weight: 95.6 kg    Examination: Nad, calm Cta no w/r Regular s1/s2 no gallop Soft benign +bs No edema Mood and affect appropriate in current  setting    Data Reviewed: I have personally reviewed following labs and imaging studies  CBC: Recent Labs  Lab 09/20/21 0408  WBC 2.6*  NEUTROABS 0.9*  HGB 11.2*  HCT 37.4  MCV 73.8*  PLT 888   Basic Metabolic Panel: Recent Labs  Lab 09/19/21 0632  NA 136  K 3.8  CL 105  CO2 24  GLUCOSE 202*  BUN 13  CREATININE 1.05*  CALCIUM 8.8*  MG 1.7   GFR: Estimated Creatinine Clearance: 54.1 mL/min (A) (by C-G formula based on SCr of 1.05 mg/dL (H)). Liver Function Tests: No results for input(s): AST, ALT, ALKPHOS, BILITOT, PROT, ALBUMIN in the last 168 hours.  No results for input(s): LIPASE, AMYLASE in the last 168 hours. No results for input(s): AMMONIA in the last 168 hours.  Coagulation Profile: No results for input(s): INR, PROTIME in the last 168 hours.  Cardiac Enzymes: No results for input(s): CKTOTAL, CKMB, CKMBINDEX, TROPONINI in the last 168 hours. BNP (last 3 results) No results for input(s): PROBNP in the last 8760 hours. HbA1C: No results for input(s): HGBA1C in the last 72 hours. CBG: Recent Labs  Lab 09/24/21 0818 09/24/21 1157 09/24/21 1652 09/24/21 1948 09/25/21 0750  GLUCAP 250* 239* 247* 178* 166*   Lipid Profile: No results for input(s): CHOL, HDL, LDLCALC, TRIG, CHOLHDL, LDLDIRECT in the last 72 hours. Thyroid Function Tests: No results for input(s): TSH, T4TOTAL, FREET4, T3FREE, THYROIDAB in the last 72 hours. Anemia Panel: No results for input(s): VITAMINB12, FOLATE, FERRITIN, TIBC, IRON, RETICCTPCT in the last 72 hours. Sepsis Labs: No results for input(s): PROCALCITON, LATICACIDVEN in the last 168 hours.   Recent Results (from the past 240 hour(s))  Resp Panel by RT-PCR (Flu A&B, Covid) Urine, Catheterized     Status: None   Collection Time: 09/16/21  5:14 PM   Specimen: Urine, Catheterized; Nasopharyngeal(NP) swabs in vial transport medium  Result Value Ref Range Status   SARS Coronavirus 2 by RT PCR NEGATIVE NEGATIVE Final     Comment: (NOTE) SARS-CoV-2 target nucleic acids are NOT DETECTED.  The SARS-CoV-2 RNA is generally detectable in upper respiratory specimens during the acute phase of infection. The lowest concentration of SARS-CoV-2 viral copies this assay can detect is 138 copies/mL. A negative result does not preclude SARS-Cov-2 infection and should not be used as the sole basis for treatment or other patient management decisions. A negative result may occur with  improper specimen collection/handling, submission of specimen other than nasopharyngeal swab, presence of viral mutation(s) within the areas targeted by this assay, and inadequate number of viral copies(<138 copies/mL). A negative result must be combined with clinical observations, patient history, and epidemiological information. The expected result is Negative.  Fact Sheet for Patients:  EntrepreneurPulse.com.au  Fact Sheet for Healthcare Providers:  IncredibleEmployment.be  This test is no t yet approved or cleared by the [place] FDA and  has been authorized for detection and/or diagnosis of SARS-CoV-2 by FDA under an Emergency Use Authorization (EUA). This EUA will remain  in effect (meaning this test can be used) for the duration of the COVID-19 declaration under Section 564(b)(1) of the Act, 21 U.S.C.section 360bbb-3(b)(1), unless the authorization is terminated  or  revoked sooner.       Influenza A by PCR NEGATIVE NEGATIVE Final   Influenza B by PCR NEGATIVE NEGATIVE Final    Comment: (NOTE) The Xpert Xpress SARS-CoV-2/FLU/RSV plus assay is intended as an aid in the diagnosis of influenza from Nasopharyngeal swab specimens and should not be used as a sole basis for treatment. Nasal washings and aspirates are unacceptable for Xpert Xpress SARS-CoV-2/FLU/RSV testing.  Fact Sheet for Patients: EntrepreneurPulse.com.au  Fact Sheet for Healthcare  Providers: IncredibleEmployment.be  This test is not yet approved or cleared by the [place] FDA and has been authorized for detection and/or diagnosis of SARS-CoV-2 by FDA under an Emergency Use Authorization (EUA). This EUA will remain in effect (meaning this test can be used) for the duration of the COVID-19 declaration under Section 564(b)(1) of the Act, 21 U.S.C. section 360bbb-3(b)(1), unless the authorization is terminated or revoked.  Performed at [facility], [address], [place], [place] [address]   Urine Culture     Status: None   Collection Time: 09/16/21  5:14 PM   Specimen: Urine, Random  Result Value Ref Range Status   Specimen Description   Final    URINE, RANDOM Performed at [facility], [address], [place], [place] [address]    Special Requests   Final    NONE Performed at [facility], [address], [place], [place] [address]    Culture   Final    NO GROWTH Performed at [facility], [place] [address] [place], [place] [address]    Report Status 09/17/2021 FINAL  Final  Blood culture (routine single)     Status: None   Collection Time: 09/16/21  5:36 PM   Specimen: BLOOD  Result Value Ref Range Status   Specimen Description BLOOD RIGHT ANTECUBITAL  Final   Special Requests   Final    BOTTLES DRAWN AEROBIC AND ANAEROBIC Blood Culture adequate volume   Culture   Final    NO GROWTH 5 DAYS Performed at [facility], [address] [place], [place] [address]    Report Status 09/21/2021 FINAL  Final  Respiratory (~20 pathogens) panel by PCR     Status: None   Collection Time: 09/18/21  1:45 PM   Specimen: Nasopharyngeal Swab; Respiratory  Result Value Ref Range Status   Adenovirus NOT DETECTED NOT DETECTED Final   Coronavirus 229E NOT DETECTED NOT DETECTED Final    Comment: (NOTE) The Coronavirus on the Respiratory Panel, DOES NOT test for the novel  Coronavirus (2019 nCoV)     Coronavirus HKU1 NOT DETECTED NOT DETECTED Final   Coronavirus NL63 NOT DETECTED NOT DETECTED Final   Coronavirus OC43 NOT DETECTED NOT DETECTED Final   Metapneumovirus NOT DETECTED NOT DETECTED Final   Rhinovirus / Enterovirus NOT DETECTED NOT DETECTED Final   Influenza A NOT DETECTED NOT DETECTED Final   Influenza B NOT DETECTED NOT DETECTED Final   Parainfluenza Virus 1 NOT DETECTED NOT DETECTED Final   Parainfluenza Virus 2 NOT DETECTED NOT DETECTED Final   Parainfluenza Virus 3 NOT DETECTED NOT DETECTED Final   Parainfluenza Virus 4 NOT DETECTED NOT DETECTED Final   Respiratory Syncytial Virus NOT DETECTED NOT DETECTED Final   Bordetella pertussis NOT DETECTED NOT DETECTED Final   Bordetella Parapertussis NOT DETECTED NOT DETECTED Final   Chlamydophila pneumoniae NOT DETECTED NOT DETECTED Final   Mycoplasma pneumoniae NOT DETECTED NOT DETECTED Final    Comment: Performed at [facility],  [address] [place], [place] [address]         Radiology Studies: No results found.      Scheduled Meds:  allopurinol  300 mg Oral Daily   amLODipine  10 mg Oral Daily   aspirin EC  81 mg Oral Daily   atorvastatin  10 mg Oral Daily   dapagliflozin propanediol  10 mg Oral Daily   enoxaparin (LOVENOX) injection  0.5 mg/kg Subcutaneous Q24H   ezetimibe  10 mg Oral Daily   insulin aspart  0-15 Units Subcutaneous TID WC & HS   insulin aspart  6 Units Subcutaneous TID WC   insulin detemir  35 Units Subcutaneous BID   levothyroxine  50 mcg Oral Q0600   linaclotide  145 mcg Oral Daily   losartan  25 mg Oral Daily   mometasone-formoterol  2 puff Inhalation BID   pantoprazole  40 mg Oral Daily   QUEtiapine  50 mg Oral QHS   Continuous Infusions:   LOS: 8 days    Time spent: 35 minutes with more than 50% on [place], MD [facility]   To contact the attending provider between 7A-7P or the covering provider during after hours 7P-7A, please log into  the web site [email or web address] and access using universal [place] password for that web site. If you do not have the password, please call the hospital operator.  09/25/2021, 8:23 AM

## 2021-09-26 LAB — GLUCOSE, CAPILLARY: Glucose-Capillary: 178 mg/dL — ABNORMAL HIGH (ref 70–99)

## 2021-09-26 MED ORDER — TRAZODONE HCL 50 MG PO TABS: 25.0000 mg | ORAL_TABLET | Freq: Every evening | ORAL | 0 refills | Status: DC | PRN

## 2021-09-26 MED ORDER — LINACLOTIDE 145 MCG PO CAPS: 145.0000 ug | ORAL_CAPSULE | Freq: Every day | ORAL | 0 refills | Status: DC

## 2021-09-26 NOTE — Discharge Summary (Addendum)
Chelsey [name] DOB: 09-May-1951 DOA: 09/16/2021  PCP: Chelsey Guise, MD  Admit date: 09/16/2021 Discharge date: 09/26/2021  Admitted From: home Disposition:  home  Recommendations for Outpatient Follow-up:  Follow up with PCP in 1 week Please obtain BMP/CBC in one week Please follow up with neurology as outpatient  Home Health:yes    Discharge Condition:Stable CODE STATUS:full  Diet recommendation: Heart Healthy / Carb Modified Brief/Interim Summary: Per ION:Chelsey [name] is a 70 year old female with history of COPD, on nocturnal oxygen with obstructive sleep apnea, type 2 diabetes, coronary disease, essential hypertension, hypothyroidism, peripheral vascular disease, pulmonary sarcoidosis, history of seizures with history of brain aneurysm, rheumatoid arthritis to the hospital with altered mental status.   She had severe hyperglycemia with glucose more than 600, lactic acidosis of 6.0.  She was given fluids, she also received a insulin drip.  She does not have anion gap, no DKA. Patient condition improved, but mental status did not improve.  MRI of the brain did not show significant abnormality.  Patient was deemed lacking decision-making capacity by psychiatry initially.psychiatry reevaluated patient later during her hsopitalization and felt  patient appears to be stable psychiatrically asymptomatic. They discontinued her seroquel. No other change to treatment and no need for specific psychiatric follow-up .  Does need to follow-up with neurology as outpatient for evaluation of dementia.   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 Patient condition still not improving, she is still confused, agitated.  She was taking Seroquel every night, she has intermittent insomnia. I will ask neurology to see her again to make a final diagnosis with the potential dementia.  Social worker is looking for memory unit. 11/10 Will need outpatient neurology.   DC Haldol     Acute encephalopathy.  Most  likely metabolic due to severe hyperglycemia. Uncontrolled diabetes with diabetes hyperosmolality with altered mental status. Severe lactic acidosis. Elevated troponin secondary to diabetes hyperosmolality. Condition has improved.  Blood glucose levels improved  EEG revealed focal slowing over the left temporal region indicating focal cerebral dysfunction in that area. No electrographic seizures were seen. 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  Needs to follow-up with neurology as outpatient for evaluation of dementia     Acute kidney injury. Hypomagnesemia Mild hypophosphatemia. Improved with IV fluids and provide replacements    Seizure disorder. No recurrence  Continue meds   Sarcoidosis of the lungs. Follow-up with PCP as outpatient.  Obstruct sleep apnea. Continue nocturnal oxygen.   Leukopenia.  No neutropenia. Respiratory viral panel negative. Follow-up with PCP      Discharge Diagnoses:  Principal Problem:   Acute metabolic encephalopathy Active Problems:   Hypothyroidism due to acquired atrophy of thyroid   Sarcoidosis of lung (Chelsey [name])   Type 2 diab w hyprosm w/o nonket hyprgly-hypros coma [facility]) (HCC)   Stage 3b chronic kidney disease ([place])   AMS (altered mental status)   Uncontrolled type 2 diabetes mellitus with hyperglycemia [name])    Discharge Instructions  Discharge Instructions     Call MD for:  temperature >100.4   Complete by: As directed    Diet - low sodium heart healthy   Complete by: As directed    Diet Carb Modified   Complete by: As directed    Discharge instructions   Complete by: As directed    Follow up with neurology and pcpc   Increase activity slowly   Complete by: As directed    Increase activity  slowly   Complete by: As directed    Increase activity slowly   Complete by: As directed       Allergies as of 09/26/2021       Reactions    Oxycodone-acetaminophen Other (See Comments)   Hallucinations Hallucinations HALLUCINATIONS Other reaction(s): Other (See Comments) HALLUCINATIONS Other reaction(s): Other (See Comments) Hallucinations Hallucinations HALLUCINATIONS Hallucinations Other reaction(s): Other (See Comments), Other (See Comments) HALLUCINATIONS Hallucinations Hallucinations HALLUCINATIONS Other reaction(s): Other (See Comments) HALLUCINATIONS Other reaction(s): Other (See Comments) Hallucinations Hallucinations HALLUCINATIONS Hallucinations Other reaction(s): Other (See Comments) HALLUCINATIONS Other reaction(s): Other (See Comments) Hallucinations Hallucinations HALLUCINATIONS Hallucinations   Indomethacin Other (See Comments)   BURN HOLE IN STOMACH BURN HOLE IN STOMACH Other reaction(s): Other (See Comments), Other (See Comments) BURN HOLE IN STOMACH BURN HOLE IN STOMACH BURN HOLE IN STOMACH BURN HOLE IN STOMACH BURN HOLE IN STOMACH   Penicillins Rash        Medication List     STOP taking these medications    colchicine 0.6 MG tablet   hydrALAZINE 10 MG tablet Commonly known as: APRESOLINE   insulin lispro 100 UNIT/ML KwikPen Commonly known as: HUMALOG   potassium chloride SA 20 MEQ tablet Commonly known as: KLOR-CON   torsemide 10 MG tablet Commonly known as: DEMADEX   Tresiba FlexTouch 200 UNIT/ML FlexTouch Pen Generic drug: insulin degludec       TAKE these medications    Accu-Chek Aviva Plus w/Device Kit Use as directed.   albuterol 108 (90 Base) MCG/ACT inhaler Commonly known as: VENTOLIN HFA Inhale 2 puffs into the lungs every 6 (six) hours as needed for wheezing or shortness of breath.   allopurinol 300 MG tablet Commonly known as: ZYLOPRIM TAKE 1 TABLET BY MOUTH DAILY   amLODipine 10 MG tablet Commonly known as: NORVASC Take 1 tablet (10 mg total) by mouth daily.   aspirin EC 81 MG tablet Take 1 tablet (81 mg total) by mouth daily.    atorvastatin 10 MG tablet Commonly known as: LIPITOR TAKE 1 TABLET BY MOUTH DAILY FOR CHOLESTEROL   cilostazol 50 MG tablet Commonly known as: PLETAL TAKE 1 TABLET BY MOUTH TWICE A DAY   ezetimibe 10 MG tablet Commonly known as: ZETIA Take 1 tablet (10 mg total) by mouth daily. What changed:  how much to take when to take this   Farxiga 10 MG Tabs tablet Generic drug: dapagliflozin propanediol TAKE 1 TABLET BY MOUTH DAILY   Fluticasone-Salmeterol 250-50 MCG/DOSE Aepb Commonly known as: ADVAIR Inhale 1 puff into the lungs 2 (two) times daily.   gabapentin 300 MG capsule Commonly known as: NEURONTIN TAKE 1 CAPSULE BY MOUTH 3 TIMES A DAY   glucose blood test strip Test sugar 4 x aday for uncontrolled dm on insulin E11.22   hydroxychloroquine 200 MG tablet Commonly known as: PLAQUENIL Take 1 tablet (200 mg total) by mouth 2 (two) times daily.   insulin isophane & regular human KwikPen (70-30) 100 UNIT/ML KwikPen Commonly known as: HUMULIN 70/30 MIX Inject 25 Units into the skin 2 (two) times daily.   Lancets Ultra Thin Misc Apply 1 each topically daily.   levothyroxine 50 MCG tablet Commonly known as: SYNTHROID TAKE 1 TABLET BY MOUTH IN THE MORNING ONAN EMPTY STOMACH FOR UNDERACTIVE THYROID   linaclotide 145 MCG Caps capsule Commonly known as: Linzess Take 1 capsule (145 mcg total) by mouth daily.   losartan 50 MG tablet Commonly known as: COZAAR TAKE 1 TABLET BY MOUTH DAILY FOR HYPENTENSION   omeprazole  20 MG capsule Commonly known as: PRILOSEC TAKE ONE CAPSULE BY MOUTH TWICE A DAY FOR GERD   OXYGEN Inhale 3 L into the lungs. Nighttime use   traZODone 50 MG tablet Commonly known as: DESYREL Take 0.5 tablets (25 mg total) by mouth at bedtime as needed for sleep.   UltiCare Short Pen Needles 31G X 8 MM Misc Generic drug: Insulin Pen Needle USE 3 TIMES A DAY        Follow-up Information     Chelsey Guise, MD. Go on 09/29/2021.   Specialties:  Internal Medicine, Cardiology Why: _0 :40pm Contact information: [place] [address] [phone]         [place]. Schedule an appointment as soon as possible for a visit in 2 week(s).   Why: Patient to call and make appointment Contact information: [place]        Vladimir Crofts, MD Follow up in 2 week(s).   Specialty: Neurology Why: or anyone in practice. f/u from hospital, needs evalution for dementia Contact information: [facility]  [place] [address] [phone]                Allergies  Allergen Reactions   Oxycodone-Acetaminophen Other (See Comments)    Hallucinations Hallucinations HALLUCINATIONS Other reaction(s): Other (See Comments) HALLUCINATIONS Other reaction(s): Other (See Comments) Hallucinations Hallucinations HALLUCINATIONS Hallucinations Other reaction(s): Other (See Comments), Other (See Comments) HALLUCINATIONS Hallucinations Hallucinations HALLUCINATIONS Other reaction(s): Other (See Comments) HALLUCINATIONS Other reaction(s): Other (See Comments) Hallucinations Hallucinations HALLUCINATIONS Hallucinations Other reaction(s): Other (See Comments) HALLUCINATIONS Other reaction(s): Other (See Comments) Hallucinations Hallucinations HALLUCINATIONS Hallucinations    Indomethacin Other (See Comments)    BURN HOLE IN STOMACH BURN HOLE IN STOMACH Other reaction(s): Other (See Comments), Other (See Comments) BURN HOLE IN STOMACH BURN HOLE IN STOMACH BURN HOLE IN STOMACH BURN HOLE IN STOMACH BURN HOLE IN STOMACH    Penicillins Rash    Consultations: Neurology and psychiatry   Procedures/Studies: CT HEAD WO CONTRAST  Result Date: 09/16/2021 CLINICAL DATA:  Slurred speech. EXAM: CT HEAD WITHOUT CONTRAST CT CERVICAL SPINE WITHOUT CONTRAST TECHNIQUE: Multidetector CT  imaging of the head and cervical spine was performed following the standard protocol without intravenous contrast. Multiplanar CT image reconstructions of the cervical spine were also generated. COMPARISON:  CT a head dated October 05, 2019. FINDINGS: CT HEAD FINDINGS Brain: No evidence of acute infarction, hemorrhage, hydrocephalus, extra-axial collection or mass lesion/mass effect. 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 Stable mild atrophy and advanced chronic microvascular ischemic changes. Vascular: Calcified atherosclerosis at the skull base. No hyperdense vessel. Unchanged aneurysm coils along the planum sphenoidale on the right. Skull: Normal. Negative for fracture or focal lesion. Sinuses/Orbits: No acute finding. Other: None. CT CERVICAL SPINE FINDINGS Alignment: Straightening of the normal cervical lordosis. No listhesis. Skull base and vertebrae: No acute fracture. No primary bone lesion or focal pathologic process. Soft tissues and spinal canal: No prevertebral fluid or swelling. No visible canal hematoma. Disc levels: Moderate disc height loss and endplate spurring at M0-N0 and C6-C7. Upper chest: Negative. Other: None. IMPRESSION: 1. No acute intracranial abnormality. 2. No acute cervical spine fracture or traumatic listhesis. Electronically Signed   By: Titus Dubin M.D.   On: 09/16/2021 15:42   CT CERVICAL SPINE WO CONTRAST  Result Date: 09/16/2021 CLINICAL DATA:  Slurred speech. EXAM: CT HEAD WITHOUT CONTRAST CT CERVICAL SPINE WITHOUT CONTRAST TECHNIQUE: Multidetector CT imaging of the head and cervical spine was performed following the standard protocol without intravenous contrast. Multiplanar CT image reconstructions of the cervical spine were also generated. COMPARISON:  CT a head dated October 05, 2019. FINDINGS: CT HEAD FINDINGS Brain: No evidence of acute infarction,  hemorrhage, hydrocephalus, extra-axial collection or mass lesion/mass effect. 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 Stable mild atrophy and advanced chronic microvascular ischemic changes. Vascular: Calcified atherosclerosis at the skull base. No hyperdense vessel. Unchanged aneurysm coils along the planum sphenoidale on the right. Skull: Normal. Negative for fracture or focal lesion. Sinuses/Orbits: No acute finding. Other: None. CT CERVICAL SPINE FINDINGS Alignment: Straightening of the normal cervical lordosis. No listhesis. Skull base and vertebrae: No acute fracture. No primary bone lesion or focal pathologic process. Soft tissues and spinal canal: No prevertebral fluid or swelling. No visible canal hematoma. Disc levels: Moderate disc height loss and endplate spurring at K4-M0 and C6-C7. Upper chest: Negative. Other: None. IMPRESSION: 1. No acute intracranial abnormality. 2. No acute cervical spine fracture or traumatic listhesis. Electronically Signed   By: Titus Dubin M.D.   On: 09/16/2021 15:42   MR BRAIN W WO CONTRAST  Result Date: 09/19/2021 CLINICAL DATA:  Mental status change, unknown cause EXAM: MRI HEAD WITHOUT AND WITH CONTRAST TECHNIQUE: Multiplanar, multiecho pulse sequences of the brain and surrounding structures were obtained without and with intravenous contrast. CONTRAST:  85m GADAVIST GADOBUTROL 1 MMOL/ML IV SOLN COMPARISON:  09/16/2021 CT head, no prior MRI head FINDINGS: Brain: No restricted diffusion to suggest acute infarct. No abnormal enhancement. No acute hemorrhage, mass, mass effect, or midline shift. Confluent T2 hyperintense signal in the periventricular white matter, likely the sequela of severe chronic small vessel ischemic disease. 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 Vascular: Normal flow voids. Skull and upper cervical spine: Normal marrow signal. Sinuses/Orbits: Mucosal thickening in the right greater than left maxillary sinus. Status post bilateral lens replacements. Other: Trace fluid in left mastoid tip. IMPRESSION: No acute intracranial process. Electronically Signed   By: AMerilyn BabaM.D.   On: 09/19/2021 15:23   DG Chest Portable 1 View  Result Date: 09/16/2021 CLINICAL DATA:  Altered mental status EXAM: PORTABLE CHEST 1 VIEW COMPARISON:  Compared with report for previous examination done on 10/23/2015. Due to technical difficulties, images of the previous studies are not available for comparison FINDINGS: Transverse diameter of heart is increased. There are no signs of alveolar pulmonary edema or focal pulmonary consolidation. There is no significant pleural effusion or pneumothorax. IMPRESSION: Cardiomegaly. There are no signs of pulmonary edema or focal pulmonary consolidation. Electronically Signed   By: PElmer PickerM.D.   On: 09/16/2021 16:43   EEG adult  Result Date: 09/17/2021 SDerek Jack MD     09/17/2021  2:11 PM Routine EEG Report MWALAA CARELis a 70y.o. female with a history of seizures who is undergoing an EEG to evaluate for seizures. Report: This EEG was acquired with electrodes placed according to the International 10-20 electrode system (including Fp1, Fp2, F3, F4, C3, C4, P3, P4, O1, O2, T3, T4,  T5, T6, A1, A2, Fz, Cz, Pz). The following electrodes were missing or displaced: none. The occipital dominant rhythm was 8.5 Hz. This activity is reactive to stimulation. Drowsiness was manifested by background fragmentation; deeper stages of sleep were not identified. There was focal slowing over the left temporal region. There were no  interictal epileptiform discharges. There were no electrographic seizures identified. There was no abnormal response to photic stimulation. Hyperventilation was not performed. Impression and clinical correlation: This EEG was obtained while awake and drowsy and is abnormal due to focal slowing over the left temporal region indicating focal cerebral dysfunction in that area.   No electrographic seizures were observed during this recording. Su Monks, MD [facility] [phone] If 7pm- 7am, please page neurology on call as listed in [place].      Subjective: Has no complaints.  Excited to go home.  No chest pain or shortness of breath  Discharge Exam: Vitals:   09/26/21 0432 09/26/21 0734  BP: (!) 136/50 (!) 151/86  Pulse: (!) 59 71  Resp: 16 18  Temp: 98 F (36.7 C) 98.7 F (37.1 C)  SpO2: 95% 100%   Vitals:   09/25/21 1954 09/25/21 2342 09/26/21 0432 09/26/21 0734  BP: (!) 115/50 (!) 128/53 (!) 136/50 (!) 151/86  Pulse: 60 (!) 55 (!) 59 71  Resp: _0 Temp: 97.9 F (36.6 C) 97.8 F (36.6 C) 98 F (36.7 C) 98.7 F (37.1 C)  TempSrc: Oral Oral Oral Oral  SpO2: 95% 92% 95% 100%  Weight:      Height:        General: Pt is alert, awake, not in acute distress Cardiovascular: RRR, S1/S2 +, no rubs, no gallops Respiratory: CTA bilaterally, no wheezing, no rhonchi Abdominal: Soft, NT, ND, bowel sounds + Extremities: no edema, no cyanosis    The results of significant diagnostics from this hospitalization (including imaging, microbiology, ancillary and laboratory) are listed below for reference.     Microbiology: No results found for this or any previous visit (from the past 240 hour(s)).    Labs: BNP (last 3 results) No results for input(s): BNP in the last 8760 hours. Basic Metabolic Panel: No results for input(s): NA, K, CL, CO2, GLUCOSE, BUN, CREATININE, CALCIUM, MG, PHOS in the last 168 hours. Liver Function Tests: No results for input(s): AST,  ALT, ALKPHOS, BILITOT, PROT, ALBUMIN in the last 168 hours. No results for input(s): LIPASE, AMYLASE in the last 168 hours. No results for input(s): AMMONIA in the last 168 hours. CBC: No results for input(s): WBC, NEUTROABS, HGB, HCT, MCV, PLT in the last 168 hours.  Cardiac Enzymes: No results for input(s): CKTOTAL, CKMB, CKMBINDEX, TROPONINI in the last 168 hours. BNP: Invalid input(s): POCBNP CBG: Recent Labs  Lab 09/25/21 0750 09/25/21 1155 09/25/21 1637 09/25/21 2028 09/26/21 0734  GLUCAP 166* 304* 144* 116* 178*   D-Dimer No results for input(s): DDIMER in the last 72 hours. Hgb A1c No results for input(s): HGBA1C in the last 72 hours. Lipid Profile No results for input(s): CHOL, HDL, LDLCALC, TRIG, CHOLHDL, LDLDIRECT in the last 72 hours. Thyroid function studies No results for input(s): TSH, T4TOTAL, T3FREE, THYROIDAB in the last 72 hours.  Invalid input(s): FREET3 Anemia work up No results for input(s): VITAMINB12, FOLATE, FERRITIN, TIBC, IRON, RETICCTPCT in the last 72 hours. Urinalysis    Component Value Date/Time   COLORURINE STRAW (A) 09/16/2021 1714   APPEARANCEUR CLEAR (A) 09/16/2021 1714   APPEARANCEUR Clear 08/20/2020 1248  LABSPEC 1.023 09/16/2021 1714   LABSPEC 1.017 07/03/2013 2032   PHURINE 6.0 09/16/2021 1714   GLUCOSEU >=500 (A) 09/16/2021 1714   GLUCOSEU Negative 07/03/2013 2032   HGBUR NEGATIVE 09/16/2021 1714   BILIRUBINUR NEGATIVE 09/16/2021 1714   BILIRUBINUR Negative 08/20/2020 1248   BILIRUBINUR Negative 07/03/2013 2032   KETONESUR NEGATIVE 09/16/2021 1714   PROTEINUR NEGATIVE 09/16/2021 1714   UROBILINOGEN 0.2 07/31/2020 1052   NITRITE NEGATIVE 09/16/2021 1714   LEUKOCYTESUR NEGATIVE 09/16/2021 1714   LEUKOCYTESUR Trace 07/03/2013 2032   Sepsis Labs Invalid input(s): PROCALCITONIN,  WBC,  LACTICIDVEN Microbiology No results found for this or any previous visit (from the past 240 hour(s)).    Time coordinating discharge:  Over 30 minutes  SIGNED:   Nolberto Hanlon, MD  [facility] 09/28/2021, 1:57 PM Pager   If 7PM-7AM, please contact night-coverage [email or web address] Password [email or web address]

## 2021-09-26 NOTE — TOC Transition Note (Signed)
Transition of Care [facility]) - CM/SW Discharge Note   Patient Details  Name: Chelsey [name] MRN: [id number] Date of Birth: Sep 18, 1951  Transition of Care [facility]) CM/SW Contact:  Shelbie Hutching, RN Phone Number: 09/26/2021, 9:54 AM   Clinical Narrative:    Patient cleared medically and by psych to discharge home today.  Patient's sister is here at the bedside and will transport patient home.  [facility], [facility], and her peer support person will be contacted by sister to resume services.  [facility] will call to schedule initial home health visit.    Final next level of care: [place] Barriers to Discharge: Barriers Resolved   Patient Goals and CMS Choice Patient states their goals for this hospitalization and ongoing recovery are:: Patient wants to go home CMS Medicare.gov Compare Post Acute Care list provided to:: Patient Represenative (must comment) Choice offered to / list presented to : Patient, Sibling  Discharge Placement                       Discharge Plan and Services   Discharge Planning Services: CM Consult Post Acute Care Choice: Home Health          DME Arranged: N/A DME Agency: NA       HH Arranged: RN [place] Agency: [place] ([place]) Date HH Agency Contacted: 09/26/21 Time [place]: [phone] Representative spoke with at [place]: [place] ([place]) Interventions     Readmission Risk Interventions Readmission Risk Prevention Plan 09/19/2021  Transportation Screening Complete  PCP or Specialist Appt within 3-5 Days Complete  HRI or [place] Complete  Social Work Consult for [place] Planning/Counseling Complete  Palliative Care Screening Not Applicable  Medication Review Press photographer) Complete  Some recent data might be hidden

## 2021-09-26 NOTE — Care Management Important Message (Signed)
Important Message  Patient Details  Name: Chelsey [name] MRN: [id number] Date of Birth: 1951-05-04   Medicare Important Message Given:  Yes  Late entry - was directed to talk with the patient's sister, Hughie Closs [phone] and I reviewed the Important Message from Medicare with her. She is in agreement with the discharge for today. I asked if she would like a copy of the form and she replied yes. Asked me to mail to [address], [place], [place] [address]. I thanked her for her time.   Juliann Pulse A Sumeet Geter 09/26/2021, 1:04 PM

## 2021-09-26 NOTE — Consult Note (Signed)
NEUROLOGY CONSULTATION NOTE   Date of service: September 26, 2021 Patient Name: Chelsey [name] MRN:  [id number] DOB:  [birth year] Reason for consult: encephalopathy, question of dementia Requesting physician: Dr. Nolberto Hanlon _ _ _   _ __   _ __ _ _  __ __   _ __   __ _  History of Present Illness    70 yo woman with hx COPD, CAD, DM2, HTN, hypothyroidism, pulmonary sarcoidosis, seizures, OSA, brain aneurysm who is admitted with encephalopathy. EMS was consulted for dysarthria and facial droop but neither were present on EMS arrival.  She was agitated combative and therefore was taken to the emergency department where she was IVC.  Per family at baseline patient is alert, awake, oriented, ambulatory, lives independently without issues.  This behavior was very different than her typical.  She was found to be hyperglycemic with a glucose greater than 600 with a lactic acidosis of 6.0.  She did not have an anion gap, was not in DKA.  She did receive an insulin drip but her mental status did not immediately improve.  MRI of the brain wwo did not show acute abnormality.  Patient was deemed to lack decision-making capacity.  Neurology is consulted for question of possible dementia.  She was cleared by psychiatry yesterday as having no evidence of imminent risk to self or others at present.  Seroquel was discontinued yesterday. EEG 11/2 showed focal slowing over the left temporal region and was otherwise unremarkable.   ROS   Per HPI: all other systems reviewed and are negative  Past History   I have reviewed the following:  Past Medical History:  Diagnosis Date  . Anemia   . Aneurysm ([place])   . Arthritis    RHEUMATOID ARTHRITIS  . Asthma   . Collagen vascular disease ([place])   . COPD (chronic obstructive pulmonary disease) ([place])   . Coronary artery disease   . Diabetes mellitus without complication ([place])   . Edema    FEET/LEGS  . GERD (gastroesophageal reflux disease)   . Gout   . H/O wheezing    . History of hiatal hernia   . Hypertension   . Hypothyroidism   . Neuropathy   . Oxygen deficiency    HS  . Peripheral vascular disease ([place])   . Sarcoidosis of lung ([place])   . Seizures ([place])    WITH BRAIN 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   . Sleep apnea    OXYGEN AT NIGHT 3 L [place]   Past Surgical History:  Procedure Laterality Date  . BRAIN SURGERY  1990's  . CATARACT EXTRACTION W/PHACO Left 06/11/2016   Procedure: CATARACT EXTRACTION PHACO AND INTRAOCULAR LENS PLACEMENT (IOC);  Surgeon: Birder Robson, MD;  Location: [facility] ORS;  Service: Ophthalmology;  Laterality: Left;  [place] 1.01 AP% 21.6 CDE 13.23 Fluid pack lot # [id number]  . CEREBRAL ANEURYSM REPAIR  1990's   COILS  . CORONARY ARTERY BYPASS GRAFT    . EYE SURGERY  2000's   bilateral cataract  . FRACTURE SURGERY    . HERNIA REPAIR  2000   ventral  . STENT PLACEMENT VASCULAR ([place] HX)    . VEIN BYPASS SURGERY    . VENTRAL HERNIA REPAIR N/A 04/29/2016   Procedure: Laparoscopic HERNIA REPAIR VENTRAL ADULT;  Surgeon: Jules Husbands, MD;  Location: [facility] ORS;  Service: General;  Laterality: N/A;   Family History  Problem Relation Age of Onset  . Heart disease Mother   . Hypertension Mother   .  Diabetes Mother   . Diabetes Father   . Heart disease Father   . Hypertension Father   . Breast cancer Maternal Aunt    Social History   Socioeconomic History  . Marital status: Single    Spouse name: Not on file  . Number of children: Not on file  . Years of education: Not on file  . Highest education level: Not on file  Occupational History  . Not on file  Tobacco Use  . Smoking status: Former    Packs/day: 1.00    Years: 30.00    Pack years: 30.00    Types: Cigarettes    Quit date: 01/27/2006    Years since quitting: 15.6  . Smokeless tobacco: Never  . Tobacco comments:    quit   Substance and Sexual Activity  . Alcohol use: No    Alcohol/week: 0.0 standard drinks  . Drug use: No  . Sexual activity: Not on file   Other Topics Concern  . Not on file  Social History Narrative  . Not on file   Social Determinants of Health   Financial Resource Strain: Not on file  Food Insecurity: Not on file  Transportation Needs: Not on file  Physical Activity: Not on file  Stress: Not on file  Social Connections: Not on file   Allergies  Allergen Reactions  . Oxycodone-Acetaminophen Other (See Comments)    Hallucinations Hallucinations HALLUCINATIONS Other reaction(s): Other (See Comments) HALLUCINATIONS Other reaction(s): Other (See Comments) Hallucinations Hallucinations HALLUCINATIONS Hallucinations Other reaction(s): Other (See Comments), Other (See Comments) HALLUCINATIONS Hallucinations Hallucinations HALLUCINATIONS Other reaction(s): Other (See Comments) HALLUCINATIONS Other reaction(s): Other (See Comments) Hallucinations Hallucinations HALLUCINATIONS Hallucinations Other reaction(s): Other (See Comments) HALLUCINATIONS Other reaction(s): Other (See Comments) Hallucinations Hallucinations HALLUCINATIONS Hallucinations   . Indomethacin Other (See Comments)    BURN HOLE IN STOMACH BURN HOLE IN STOMACH Other reaction(s): Other (See Comments), Other (See Comments) BURN HOLE IN STOMACH BURN HOLE IN STOMACH BURN HOLE IN STOMACH BURN HOLE IN STOMACH BURN HOLE IN STOMACH   . Penicillins Rash    Medications   Medications Prior to Admission  Medication Sig Dispense Refill Last Dose  . albuterol (VENTOLIN HFA) 108 (90 Base) MCG/ACT inhaler Inhale 2 puffs into the lungs every 6 (six) hours as needed for wheezing or shortness of breath.   unknown at unknown  . allopurinol (ZYLOPRIM) 300 MG tablet TAKE 1 TABLET BY MOUTH DAILY 90 tablet 1 unknown at unknown  . amLODipine (NORVASC) 10 MG tablet Take 1 tablet (10 mg total) by mouth daily. 90 tablet 1 unknown at unknown  . atorvastatin (LIPITOR) 10 MG tablet TAKE 1 TABLET BY MOUTH DAILY FOR CHOLESTEROL 90 tablet 1 unknown at unknown   . Blood Glucose Monitoring Suppl (ACCU-CHEK AVIVA PLUS) W/DEVICE KIT Use as directed.     . cilostazol (PLETAL) 50 MG tablet TAKE 1 TABLET BY MOUTH TWICE A DAY 180 tablet 0 unknown at unknown  . colchicine 0.6 MG tablet TAKE 1 TO 2 TABLETS BY MOUTH DAILY AS NEEDED FOR GOUT FLARE **ONLY TAKE WITH GOUT FLARE** 60 tablet 3 unknown at unknown  . FARXIGA 10 MG TABS tablet TAKE 1 TABLET BY MOUTH DAILY 30 tablet 3 unknown at unknown  . Fluticasone-Salmeterol (ADVAIR) 250-50 MCG/DOSE AEPB Inhale 1 puff into the lungs 2 (two) times daily.   unknown at unknown  . gabapentin (NEURONTIN) 300 MG capsule TAKE 1 CAPSULE BY MOUTH 3 TIMES A DAY 270 capsule 1 unknown at unknown  . glucose blood  test strip Test sugar 4 x aday for uncontrolled dm on insulin E11.22 120 each 12   . hydrALAZINE (APRESOLINE) 10 MG tablet Take 1 tablet (10 mg total) by mouth 3 (three) times daily. 90 tablet 3 unknown at unknown  . hydroxychloroquine (PLAQUENIL) 200 MG tablet Take 1 tablet (200 mg total) by mouth 2 (two) times daily. 60 tablet 2 unknown at unknown  . insulin degludec (TRESIBA FLEXTOUCH) 200 UNIT/ML FlexTouch Pen 30 Units with breakfast, with lunch, and with evening meal.   unknown at unknown  . insulin lispro (HUMALOG) 100 UNIT/ML KwikPen 30 units at breakfast 35 units at lunch 30 units at dinner   unknown at unknown  . LANCETS ULTRA THIN MISC Apply 1 each topically daily.      Marland Kitchen levothyroxine (SYNTHROID) 50 MCG tablet TAKE 1 TABLET BY MOUTH IN THE MORNING ONAN EMPTY STOMACH FOR UNDERACTIVE THYROID 90 tablet 3 unknown at unknown  . losartan (COZAAR) 50 MG tablet TAKE 1 TABLET BY MOUTH DAILY FOR HYPENTENSION 90 tablet 3 unknown at unknown  . omeprazole (PRILOSEC) 20 MG capsule TAKE ONE CAPSULE BY MOUTH TWICE A DAY FOR GERD 180 capsule 2 unknown at unknown  . OXYGEN Inhale 3 L into the lungs. Nighttime use     . potassium chloride SA (KLOR-CON) 20 MEQ tablet TAKE 1 TABLET BY MOUTH DAILY 90 tablet 1 unknown at unknown  .  torsemide (DEMADEX) 10 MG tablet Take 10 mg by mouth daily.   unknown at unknown  . ULTICARE SHORT PEN NEEDLES 31G X 8 MM MISC USE 3 TIMES A DAY 300 each 1       Current Facility-Administered Medications:  .  albuterol (PROVENTIL) (2.5 MG/3ML) 0.083% nebulizer solution 3 mL, 3 mL, Inhalation, Q6H PRN, Sharen Hones, MD .  allopurinol (ZYLOPRIM) tablet 300 mg, 300 mg, Oral, Daily, Sharen Hones, MD, 300 mg at 09/25/21 0913 .  amLODipine (NORVASC) tablet 10 mg, 10 mg, Oral, Daily, Sharen Hones, MD, 10 mg at 09/25/21 0913 .  aspirin EC tablet 81 mg, 81 mg, Oral, Daily, Sharen Hones, MD, 81 mg at 09/25/21 0913 .  atorvastatin (LIPITOR) tablet 10 mg, 10 mg, Oral, Daily, Sharen Hones, MD, 10 mg at 09/25/21 0913 .  dapagliflozin propanediol (FARXIGA) tablet 10 mg, 10 mg, Oral, Daily, Renda Rolls, RPH, 10 mg at 09/25/21 0912 .  dextrose 50 % solution 0-50 mL, 0-50 mL, Intravenous, PRN, Florina Ou V, MD .  enoxaparin (LOVENOX) injection 47.5 mg, 0.5 mg/kg, Subcutaneous, Q24H, Sharen Hones, MD, 47.5 mg at 09/25/21 2159 .  ezetimibe (ZETIA) tablet 10 mg, 10 mg, Oral, Daily, Sharen Hones, MD, 10 mg at 09/25/21 0912 .  insulin aspart (novoLOG) injection 0-15 Units, 0-15 Units, Subcutaneous, TID WC & HS, Sharen Hones, MD, 2 Units at 09/25/21 1706 .  insulin aspart (novoLOG) injection 10 Units, 10 Units, Subcutaneous, TID WC, Nolberto Hanlon, MD, 10 Units at 09/25/21 1705 .  insulin detemir (LEVEMIR) injection 35 Units, 35 Units, Subcutaneous, BID, Nolberto Hanlon, MD, 35 Units at 09/25/21 2157 .  levothyroxine (SYNTHROID) tablet 50 mcg, 50 mcg, Oral, Q0600, Sharen Hones, MD, 50 mcg at 09/26/21 0658 .  linaclotide (LINZESS) capsule 145 mcg, 145 mcg, Oral, Daily, Sharen Hones, MD, 145 mcg at 09/25/21 0912 .  LORazepam (ATIVAN) injection 0.5 mg, 0.5 mg, Intravenous, Q4H PRN, Mansy, Jan A, MD, 0.5 mg at 09/20/21 1317 .  losartan (COZAAR) tablet 25 mg, 25 mg, Oral, Daily, Sharen Hones, MD, 25 mg at 09/25/21 0913 .  mometasone-formoterol (DULERA) 200-5 MCG/ACT inhaler 2 puff, 2 puff, Inhalation, BID, Sharen Hones, MD, 2 puff at 09/25/21 2000 .  pantoprazole (PROTONIX) EC tablet 40 mg, 40 mg, Oral, Daily, Sharen Hones, MD, 40 mg at 09/25/21 0913 .  phenol (CHLORASEPTIC) mouth spray 1 spray, 1 spray, Mouth/Throat, PRN, Sharen Hones, MD .  traZODone (DESYREL) tablet 25 mg, 25 mg, Oral, QHS PRN, Mansy, Jan A, MD, 25 mg at 09/25/21 2155  Vitals   Vitals:   09/25/21 1954 09/25/21 2342 09/26/21 0432 09/26/21 0734  BP: (!) 115/50 (!) 128/53 (!) 136/50 (!) 151/86  Pulse: 60 (!) 55 (!) 59 71  Resp: 16 14 16 18   Temp: 97.9 F (36.6 C) 97.8 F (36.6 C) 98 F (36.7 C) 98.7 F (37.1 C)  TempSrc: Oral Oral Oral Oral  SpO2: 95% 92% 95% 100%  Weight:      Height:         Body mass index is 37.34 kg/m.  Physical Exam   Physical Exam Gen: A&O x4, NAD HEENT: Atraumatic, normocephalic;mucous membranes moist; oropharynx clear, tongue without atrophy or fasciculations. Neck: Supple, trachea midline. Resp: CTAB, no w/r/r CV: RRR, no m/g/r; nml S1 and S2. 2+ symmetric peripheral pulses. Abd: soft/NT/ND; nabs x 4 quad Extrem: Nml bulk; no cyanosis, clubbing, or edema.  Neuro: *MS: A&O x4. Follows multi-step commands. She is able to tell me detailed history about her life outside of the hospital. She is oriented to all location and time questions including exact date and day of the week. She is able to spell WORLD backwards. Registration is 3/3, delayed recall is 0/3. *Speech: fluid, nondysarthric, able to name and repeat *CN:    I: Deferred   II,III: PERRLA, VFF by confrontation, optic discs unable to be visualized 2/2 pupillary constriction   III,IV,VI: EOMI w/o nystagmus, no ptosis   V: Sensation intact from V1 to V3 to LT   VII: Eyelid closure was full.  Smile symmetric.   VIII: Hearing intact to voice   IX,X: Voice normal, palate elevates symmetrically    XI: SCM/trap 5/5 bilat   XII: Tongue protrudes  midline, no atrophy or fasciculations   *Motor:   Normal bulk.  No tremor, rigidity or bradykinesia. No pronator drift.    Strength: Dlt Bic Tri WrE WrF FgS Gr HF KnF KnE PlF DoF    Left 5 5 5 5 5 5 5 5 5 5 5 5     Right 5 5 5 5 5 5 5 5 5 5 5 5     *Sensory: Intact to light touch, pinprick, temperature vibration throughout. Symmetric. Propioception intact bilat.  No double-simultaneous extinction.  *Coordination:  Finger-to-nose, heel-to-shin, rapid alternating motions were intact. *Reflexes:  2+ and symmetric throughout without clonus; toes down-going bilat *Gait: normal base, normal stride, normal turn. Negative Romberg.   Labs   CBC:  Recent Labs  Lab 09/20/21 0408  WBC 2.6*  NEUTROABS 0.9*  HGB 11.2*  HCT 37.4  MCV 73.8*  PLT 931    Basic Metabolic Panel:  Lab Results  Component Value Date   NA 136 09/19/2021   K 3.8 09/19/2021   CO2 24 09/19/2021   GLUCOSE 202 (H) 09/19/2021   BUN 13 09/19/2021   CREATININE 1.05 (H) 09/19/2021   CALCIUM 8.8 (L) 09/19/2021   GFRNONAA 57 (L) 09/19/2021   GFRAA 43 (L) 11/20/2019   Lipid Panel:  Lab Results  Component Value Date   LDLCALC 64 11/20/2019   HgbA1c:  Lab Results  Component Value Date   HGBA1C 12.8 (H) 09/16/2021   Urine Drug Screen:     Component Value Date/Time   LABOPIA NONE DETECTED 09/16/2021 1714   COCAINSCRNUR NONE DETECTED 09/16/2021 1714   LABBENZ NONE DETECTED 09/16/2021 1714   AMPHETMU NONE DETECTED 09/16/2021 1714   THCU NONE DETECTED 09/16/2021 1714   LABBARB NONE DETECTED 09/16/2021 1714    Alcohol Level     Component Value Date/Time   ETH <10 09/16/2021 1618     Impression    70 yo woman with hx COPD, CAD, DM2, HTN, hypothyroidism, pulmonary sarcoidosis, seizures, OSA, brain aneurysm who is admitted with encephalopathy in the setting of hyperglycemia. She has a remarkably good mental status exam with no deficits appreciated except impaired delayed recall. There is no basis for a  diagnosis of dementia based on my exam.   Recommendations   - No basis for diagnosis of dementia, especially inpatient - If c/f short-term memory deficits persists, she can be evaluated by neurology as outpatient - Patient is [facility] with [facility] at discharge  Neurology to sign off, please re-engage if additional questions arise. ______________________________________________________________________   Thank you for the opportunity to take part in the care of this patient. If you have any further questions, please contact the neurology consultation attending.  Signed,  Su Monks, MD [facility] [phone]  If 7pm- 7am, please page neurology on call as listed in [place].

## 2021-09-26 NOTE — Progress Notes (Signed)
Penelope Galas to be D/C'd  per MD order.  Discussed with the patient and all questions fully answered.  VSS, Skin clean, dry and intact without evidence of skin break down, no evidence of skin tears noted.  IV catheter discontinued intact. Site without signs and symptoms of complications. Dressing and pressure applied.  An After Visit Summary was printed and given to the patient. Patient received prescription.  D/c education completed with patient/family including follow up instructions, medication list, d/c activities limitations if indicated, with other d/c instructions as indicated by MD - patient able to verbalize understanding, all questions fully answered.   Patient instructed to return to ED, call 911, or call MD for any changes in condition.   Patient to be escorted via [place], and D/C home via private auto.

## 2021-09-27 DIAGNOSIS — E1100 Type 2 diabetes mellitus with hyperosmolarity without nonketotic hyperglycemic-hyperosmolar coma (NKHHC): Secondary | ICD-10-CM | POA: Diagnosis not present

## 2021-09-27 DIAGNOSIS — N1832 Chronic kidney disease, stage 3b: Secondary | ICD-10-CM | POA: Diagnosis not present

## 2021-09-27 DIAGNOSIS — D860 Sarcoidosis of lung: Secondary | ICD-10-CM | POA: Diagnosis not present

## 2021-09-27 DIAGNOSIS — G9341 Metabolic encephalopathy: Secondary | ICD-10-CM | POA: Diagnosis not present

## 2021-09-29 ENCOUNTER — Inpatient Hospital Stay: Payer: Medicare Other | Admitting: Nurse Practitioner

## 2021-10-01 DIAGNOSIS — J449 Chronic obstructive pulmonary disease, unspecified: Secondary | ICD-10-CM | POA: Diagnosis not present

## 2021-10-02 ENCOUNTER — Telehealth: Payer: Self-pay | Admitting: Nurse Practitioner

## 2021-10-02 ENCOUNTER — Encounter: Payer: Self-pay | Admitting: Nurse Practitioner

## 2021-10-02 ENCOUNTER — Telehealth: Payer: Self-pay

## 2021-10-02 ENCOUNTER — Ambulatory Visit (INDEPENDENT_AMBULATORY_CARE_PROVIDER_SITE_OTHER): Payer: Medicare Other | Admitting: Nurse Practitioner

## 2021-10-02 ENCOUNTER — Other Ambulatory Visit: Payer: Self-pay

## 2021-10-02 VITALS — BP 117/60 | HR 71 | Temp 98.1°F | Resp 16 | Ht 63.0 in | Wt 204.2 lb

## 2021-10-02 DIAGNOSIS — G3184 Mild cognitive impairment, so stated: Secondary | ICD-10-CM

## 2021-10-02 DIAGNOSIS — Z09 Encounter for follow-up examination after completed treatment for conditions other than malignant neoplasm: Secondary | ICD-10-CM | POA: Diagnosis not present

## 2021-10-02 DIAGNOSIS — G9341 Metabolic encephalopathy: Secondary | ICD-10-CM

## 2021-10-02 NOTE — Telephone Encounter (Signed)
Awaiting 10/02/21 office notes for referral-Toni

## 2021-10-02 NOTE — Progress Notes (Signed)
[name] [place] [address] [facility] Discharge Acute Issues Care Follow Up                                                                        Patient Demographics  Chelsey [name], is a 70 y.o. female  DOB [birth year]  MRN [id number].  Primary MD  [name], FNP-C   Reason for TCC follow Up - acute metabolic encephalopathy   Past Medical History:  Diagnosis Date   Anemia    Aneurysm ([place])    Arthritis    RHEUMATOID ARTHRITIS   Asthma    Collagen vascular disease (HCC)    COPD (chronic obstructive pulmonary disease) ([place])    Coronary artery disease    Dementia (HCC)    Diabetes mellitus without complication (HCC)    Edema    FEET/LEGS   GERD (gastroesophageal reflux disease)    Gout    H/O wheezing    History of hiatal hernia    Hypertension    Hypothyroidism    Neuropathy    Oxygen deficiency    HS   Peripheral vascular disease (HCC)    Sarcoidosis of lung (HCC)    Seizures (HCC)    WITH BRAIN 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    Sleep apnea    OXYGEN AT NIGHT 3 L [place]    Past Surgical History:  Procedure Laterality Date   BRAIN SURGERY  1990's   CATARACT EXTRACTION W/PHACO Left 06/11/2016   Procedure: CATARACT EXTRACTION PHACO AND INTRAOCULAR LENS PLACEMENT ([place]);  Surgeon: Birder Robson, MD;  Location: [facility] ORS;  Service: Ophthalmology;  Laterality: Left;  [place] 1.01 AP% 21.6 CDE 13.23 Fluid pack lot # [id number]   CEREBRAL ANEURYSM REPAIR  1990's   COILS   CORONARY ARTERY BYPASS GRAFT     EYE SURGERY  2000's   bilateral cataract   FRACTURE SURGERY     HERNIA REPAIR  2000   ventral   STENT PLACEMENT VASCULAR ([place] [facility])     VEIN BYPASS SURGERY     VENTRAL HERNIA REPAIR N/A 04/29/2016   Procedure: Laparoscopic HERNIA REPAIR VENTRAL ADULT;  Surgeon: Jules Husbands, MD;  Location: [facility] ORS;  Service: General;   Laterality: N/A;       Recent HPI and Hospital Course  Chelsey [name] is a 70 year old female with history of COPD, on nocturnal oxygen with obstructive sleep apnea, type 2 diabetes, coronary disease, essential hypertension, hypothyroidism, peripheral vascular disease, pulmonary sarcoidosis, history of seizures with history of brain aneurysm, rheumatoid arthritis to the hospital with altered mental status. I have discussed with patient and her sister this morning, she has been compliant with her insulin for diabetes, she does not have any recent sickness, no nausea vomiting or diarrhea. Was very confused yesterday, agitated arriving the emergency room.  Was given sedatives.  She had severe hyperglycemia with glucose more than 600, lactic acidosis of 6.0.  She was given fluids, she also received a insulin drip.  She does not have anion gap, no DKA.  [facility] Acute Care Issue to be followed in the Clinic    Acute metabolic encephalopathy Active Problems:   Hypothyroidism due to acquired atrophy of thyroid   Sarcoidosis of lung ([place])   Type 2 diab w hyprosm w/o nonket hyprgly-hypros coma [facility]) ([place])   Stage 3b chronic kidney disease ([place])   AMS (altered mental status)   Uncontrolled type 2 diabetes mellitus with hyperglycemia (HCC)   Subjective:   Chelsey [name] today has mild cognitive impairment, memory loss, confusion and inability to focus. She denies any headache, chest pain, abdominal pain, Nausea, any new weakness tingling or numbness, Cough or SOB.   Assessment & Plan   1. Acute metabolic encephalopathy Refer to Dr. Manuella Ghazi for evaluation for dementia and follow up after treatment for encephalopathy.  - Ambulatory referral to Neurology  2. Mild cognitive impairment See problem #1 - Ambulatory referral to Neurology  3. Encounter for examination following treatment at hospital Patient continues to have residual symptoms of encephalopathy including lingering confusion, mild  cognitive impairment, memory loss and difficulty with concentration. Hospitalist recommended evaluation for dementia by neurology.     Reason for frequent admissions/ER visits   Dementia Encephalopathy Confusion Problems with memory      Objective:   Vitals:   10/02/21 1009  BP: 117/60  Pulse: 71  Resp: 16  Temp: 98.1 F (36.7 C)  SpO2: 97%  Weight: 204 lb 3.2 oz (92.6 kg)  Height: _0  (1.6 m)    Wt Readings from Last 3 Encounters:  10/02/21 204 lb 3.2 oz (92.6 kg)  09/16/21 210 lb 12.8 oz (95.6 kg)  08/27/21 210 lb 12.8 oz (95.6 kg)    Allergies as of 10/02/2021       Reactions   Oxycodone-acetaminophen Other (See Comments)   Hallucinations Hallucinations HALLUCINATIONS Other reaction(s): Other (See Comments) HALLUCINATIONS Other reaction(s): Other (See Comments) Hallucinations Hallucinations HALLUCINATIONS Hallucinations Other reaction(s): Other (See Comments), Other (See Comments) HALLUCINATIONS Hallucinations Hallucinations HALLUCINATIONS Other reaction(s): Other (See Comments) HALLUCINATIONS Other reaction(s): Other (See Comments) Hallucinations Hallucinations HALLUCINATIONS Hallucinations Other reaction(s): Other (See Comments) HALLUCINATIONS Other reaction(s): Other (See Comments) Hallucinations Hallucinations HALLUCINATIONS Hallucinations   Indomethacin Other (See Comments)   BURN HOLE IN STOMACH BURN HOLE IN STOMACH Other reaction(s): Other (See Comments), Other (See Comments) BURN HOLE IN STOMACH BURN HOLE IN STOMACH BURN HOLE IN STOMACH BURN HOLE IN STOMACH BURN HOLE IN STOMACH   Penicillins Rash        Medication List        Accurate as of October 02, 2021  1:32 PM. If you have any questions, ask your nurse or doctor.          Accu-Chek Aviva Plus w/Device Kit Use as directed.   albuterol 108 (90 Base) MCG/ACT inhaler Commonly known as: VENTOLIN HFA Inhale 2 puffs into the lungs every 6 (six) hours as  needed for wheezing or shortness of breath.   allopurinol 300 MG tablet Commonly known as: ZYLOPRIM TAKE 1 TABLET BY MOUTH DAILY   amLODipine 10 MG tablet Commonly known as: NORVASC Take 1 tablet (10 mg total) by mouth daily.   aspirin EC 81 MG tablet Take 1 tablet (81 mg total) by mouth daily.   atorvastatin 10 MG tablet Commonly known as: LIPITOR TAKE 1 TABLET BY MOUTH DAILY FOR CHOLESTEROL   cilostazol 50 MG tablet Commonly known as: PLETAL TAKE 1 TABLET BY MOUTH TWICE A DAY   ezetimibe 10 MG  tablet Commonly known as: ZETIA Take 1 tablet (10 mg total) by mouth daily.   Farxiga 10 MG Tabs tablet Generic drug: dapagliflozin propanediol TAKE 1 TABLET BY MOUTH DAILY   Fluticasone-Salmeterol 250-50 MCG/DOSE Aepb Commonly known as: ADVAIR Inhale 1 puff into the lungs 2 (two) times daily.   gabapentin 300 MG capsule Commonly known as: NEURONTIN TAKE 1 CAPSULE BY MOUTH 3 TIMES A DAY   glucose blood test strip Test sugar 4 x aday for uncontrolled dm on insulin E11.22   hydroxychloroquine 200 MG tablet Commonly known as: PLAQUENIL Take 1 tablet (200 mg total) by mouth 2 (two) times daily.   insulin isophane & regular human KwikPen (70-30) 100 UNIT/ML KwikPen Commonly known as: HUMULIN 70/30 MIX Inject 25 Units into the skin 2 (two) times daily.   Lancets Ultra Thin Misc Apply 1 each topically daily.   levothyroxine 50 MCG tablet Commonly known as: SYNTHROID TAKE 1 TABLET BY MOUTH IN THE MORNING ONAN EMPTY STOMACH FOR UNDERACTIVE THYROID   linaclotide 145 MCG Caps capsule Commonly known as: Linzess Take 1 capsule (145 mcg total) by mouth daily.   losartan 50 MG tablet Commonly known as: COZAAR TAKE 1 TABLET BY MOUTH DAILY FOR HYPENTENSION   omeprazole 20 MG capsule Commonly known as: PRILOSEC TAKE ONE CAPSULE BY MOUTH TWICE A DAY FOR GERD   OXYGEN Inhale 3 L into the lungs. Nighttime use   traZODone 50 MG tablet Commonly known as: DESYREL Take 0.5  tablets (25 mg total) by mouth at bedtime as needed for sleep.   UltiCare Short Pen Needles 31G X 8 MM Misc Generic drug: Insulin Pen Needle USE 3 TIMES A DAY         Physical Exam: Constitutional: Patient appears well-developed and well-nourished. Not in obvious distress. HENT: Normocephalic, atraumatic, External right and left ear normal. Oropharynx is clear and moist.  Eyes: Conjunctivae and EOM are normal. PERRLA, no scleral icterus. Neck: Normal ROM. Neck supple. No JVD. No tracheal deviation. No thyromegaly. CVS: RRR, S1/S2 +, no murmurs, no gallops, no carotid bruit.  Pulmonary: Effort and breath sounds normal, no stridor, rhonchi, wheezes, rales.  Abdominal: Soft. BS +, no distension, tenderness, rebound or guarding.  Musculoskeletal: Normal range of motion. No edema and no tenderness.  Lymphadenopathy: No lymphadenopathy noted, cervical, inguinal or axillary Neuro: Alert. Normal reflexes, muscle tone coordination. No cranial nerve deficit. Skin: Skin is warm and dry. No rash noted. Not diaphoretic. No erythema. No pallor. Psychiatric: Normal mood and affect. Behavior, judgment, thought content normal.   Data Review   Micro Results No results found for this or any previous visit (from the past 240 hour(s)).   CBC No results for input(s): WBC, HGB, HCT, PLT, MCV, MCH, MCHC, RDW, LYMPHSABS, MONOABS, EOSABS, BASOSABS, BANDABS in the last 168 hours.  Invalid input(s): NEUTRABS, BANDSABD  Chemistries  No results for input(s): NA, K, CL, CO2, GLUCOSE, BUN, CREATININE, CALCIUM, MG, AST, ALT, ALKPHOS, BILITOT in the last 168 hours.  Invalid input(s): GFRCGP ------------------------------------------------------------------------------------------------------------------ estimated creatinine clearance is 53.1 mL/min (A) (by C-G formula based on SCr of 1.05 mg/dL  (H)). ------------------------------------------------------------------------------------------------------------------ No results for input(s): HGBA1C in the last 72 hours. ------------------------------------------------------------------------------------------------------------------ No results for input(s): CHOL, HDL, LDLCALC, TRIG, CHOLHDL, LDLDIRECT in the last 72 hours. ------------------------------------------------------------------------------------------------------------------ No results for input(s): TSH, T4TOTAL, T3FREE, THYROIDAB in the last 72 hours.  Invalid input(s): FREET3 ------------------------------------------------------------------------------------------------------------------ No results for input(s): VITAMINB12, FOLATE, FERRITIN, TIBC, IRON, RETICCTPCT in the last 72 hours.  Coagulation profile No results  for input(s): INR, PROTIME in the last 168 hours.  No results for input(s): DDIMER in the last 72 hours.  Cardiac Enzymes No results for input(s): CKMB, TROPONINI, MYOGLOBIN in the last 168 hours.  Invalid input(s): CK ------------------------------------------------------------------------------------------------------------------ Invalid input(s): POCBNP  Return in about 3 months (around 01/02/2022) for F/U, med refill, Raynold Blankenbaker PCP.  Time Spent in minutes  45 Time spent with patient included reviewing progress notes, labs, imaging studies, and discussing plan for follow up.   [place] Controlled Substance Database was reviewed by me for overdose risk score (ORS)  This patient was seen by [name], FNP-C in collaboration with Dr. Clayborn Bigness as a part of collaborative care agreement.   [name] MSN, FNP-C on 10/02/2021 at 1:32 PM Internal Medicine  **Disclaimer: This note may have been dictated with voice recognition software. Similar sounding words can inadvertently be transcribed and this note may contain transcription errors which may  not have been corrected upon publication of note.**

## 2021-10-02 NOTE — Progress Notes (Signed)
  Chronic Care Management   Outreach Note  10/02/2021 Name: Chelsey [name] MRN: [id number] DOB: 07-25-1951  Referred by: [name], NP Reason for referral : No chief complaint on file.   An unsuccessful telephone outreach was attempted today. The patient was referred to the pharmacist for assistance with care management and care coordination.   Follow Up Plan:   Tatjana Dellinger Upstream Scheduler

## 2021-10-06 ENCOUNTER — Other Ambulatory Visit: Payer: Self-pay | Admitting: Nurse Practitioner

## 2021-10-06 DIAGNOSIS — E1100 Type 2 diabetes mellitus with hyperosmolarity without nonketotic hyperglycemic-hyperosmolar coma (NKHHC): Secondary | ICD-10-CM | POA: Diagnosis not present

## 2021-10-06 DIAGNOSIS — G9341 Metabolic encephalopathy: Secondary | ICD-10-CM | POA: Diagnosis not present

## 2021-10-06 DIAGNOSIS — N1832 Chronic kidney disease, stage 3b: Secondary | ICD-10-CM | POA: Diagnosis not present

## 2021-10-06 DIAGNOSIS — D860 Sarcoidosis of lung: Secondary | ICD-10-CM | POA: Diagnosis not present

## 2021-10-06 NOTE — Telephone Encounter (Signed)
Referral sent via [facility] to [facility] neurology-Toni

## 2021-10-07 ENCOUNTER — Telehealth: Payer: Self-pay

## 2021-10-07 ENCOUNTER — Telehealth: Payer: Self-pay | Admitting: Nurse Practitioner

## 2021-10-07 NOTE — Progress Notes (Signed)
  Chronic Care Management   Outreach Note  10/07/2021 Name: ANDORA KRULL MRN: [id number] DOB: Jan 31, 1951  Referred by: [name], NP Reason for referral : No chief complaint on file.   A second unsuccessful telephone outreach was attempted today. The patient was referred to pharmacist for assistance with care management and care coordination.  Follow Up Plan:   Tatjana Dellinger Upstream Scheduler

## 2021-10-07 NOTE — Telephone Encounter (Signed)
Spoke with pt she said she already had home health aide she don't need another

## 2021-10-13 ENCOUNTER — Telehealth: Payer: Self-pay | Admitting: Nurse Practitioner

## 2021-10-13 DIAGNOSIS — N1832 Chronic kidney disease, stage 3b: Secondary | ICD-10-CM | POA: Diagnosis not present

## 2021-10-13 DIAGNOSIS — G9341 Metabolic encephalopathy: Secondary | ICD-10-CM | POA: Diagnosis not present

## 2021-10-13 DIAGNOSIS — E1100 Type 2 diabetes mellitus with hyperosmolarity without nonketotic hyperglycemic-hyperosmolar coma (NKHHC): Secondary | ICD-10-CM | POA: Diagnosis not present

## 2021-10-13 DIAGNOSIS — D860 Sarcoidosis of lung: Secondary | ICD-10-CM | POA: Diagnosis not present

## 2021-10-13 NOTE — Progress Notes (Signed)
  Chronic Care Management   Outreach Note  10/13/2021 Name: Chelsey [name] MRN: [id number] DOB: 03-17-1951  Referred by: [name], NP Reason for referral : No chief complaint on file.   Third unsuccessful telephone outreach was attempted today. The patient was referred to the pharmacist for assistance with care management and care coordination.   Follow Up Plan:   Tatjana Dellinger Upstream Scheduler

## 2021-10-14 NOTE — Telephone Encounter (Signed)
[facility] rejected referral due to patient being dismissed 07/06/2018. Manually faxed neurology referral to [facility]

## 2021-10-15 NOTE — Telephone Encounter (Signed)
Received call from [facility]. Will not take referral with Dr. Trena Platt name on it. Refaxed without his name on referral-Toni

## 2021-10-20 DIAGNOSIS — E118 Type 2 diabetes mellitus with unspecified complications: Secondary | ICD-10-CM | POA: Diagnosis not present

## 2021-10-20 DIAGNOSIS — Z794 Long term (current) use of insulin: Secondary | ICD-10-CM | POA: Diagnosis not present

## 2021-10-20 LAB — BLOOD GAS, VENOUS
Acid-base deficit: 2.5 mmol/L — ABNORMAL HIGH (ref 0.0–2.0)
Bicarbonate: 23.2 mmol/L (ref 20.0–28.0)
O2 Saturation: 52.6 %
Patient temperature: 37
pCO2, Ven: 43 mmHg — ABNORMAL LOW (ref 44.0–60.0)
pH, Ven: 7.34 (ref 7.250–7.430)

## 2021-10-21 ENCOUNTER — Other Ambulatory Visit: Payer: Self-pay

## 2021-10-21 DIAGNOSIS — K219 Gastro-esophageal reflux disease without esophagitis: Secondary | ICD-10-CM

## 2021-10-21 MED ORDER — OMEPRAZOLE 20 MG PO CPDR: DELAYED_RELEASE_CAPSULE | ORAL | 2 refills | Status: DC

## 2021-10-22 ENCOUNTER — Telehealth: Payer: Self-pay

## 2021-10-22 NOTE — Telephone Encounter (Signed)
Chelsey [name] from [facility] called requesting Skilled nursing for 1 x week for 9 weeks. I gave verbal ok to do skilled nursing

## 2021-10-23 ENCOUNTER — Ambulatory Visit: Payer: Medicare Other | Admitting: Podiatry

## 2021-10-24 ENCOUNTER — Telehealth: Payer: Self-pay

## 2021-10-24 NOTE — Telephone Encounter (Signed)
Tiffany with [facility]  [phone]

## 2021-10-29 DIAGNOSIS — M1030 Gout due to renal impairment, unspecified site: Secondary | ICD-10-CM | POA: Diagnosis not present

## 2021-10-29 DIAGNOSIS — Z7982 Long term (current) use of aspirin: Secondary | ICD-10-CM | POA: Diagnosis not present

## 2021-10-29 DIAGNOSIS — G4733 Obstructive sleep apnea (adult) (pediatric): Secondary | ICD-10-CM | POA: Diagnosis not present

## 2021-10-29 DIAGNOSIS — E1122 Type 2 diabetes mellitus with diabetic chronic kidney disease: Secondary | ICD-10-CM | POA: Diagnosis not present

## 2021-10-29 DIAGNOSIS — I2510 Atherosclerotic heart disease of native coronary artery without angina pectoris: Secondary | ICD-10-CM | POA: Diagnosis not present

## 2021-10-29 DIAGNOSIS — E039 Hypothyroidism, unspecified: Secondary | ICD-10-CM | POA: Diagnosis not present

## 2021-10-29 DIAGNOSIS — Z9181 History of falling: Secondary | ICD-10-CM | POA: Diagnosis not present

## 2021-10-29 DIAGNOSIS — J449 Chronic obstructive pulmonary disease, unspecified: Secondary | ICD-10-CM | POA: Diagnosis not present

## 2021-10-29 DIAGNOSIS — Z9981 Dependence on supplemental oxygen: Secondary | ICD-10-CM | POA: Diagnosis not present

## 2021-10-29 DIAGNOSIS — R410 Disorientation, unspecified: Secondary | ICD-10-CM | POA: Diagnosis not present

## 2021-10-29 DIAGNOSIS — K219 Gastro-esophageal reflux disease without esophagitis: Secondary | ICD-10-CM | POA: Diagnosis not present

## 2021-10-29 DIAGNOSIS — E1142 Type 2 diabetes mellitus with diabetic polyneuropathy: Secondary | ICD-10-CM | POA: Diagnosis not present

## 2021-10-29 DIAGNOSIS — Z7951 Long term (current) use of inhaled steroids: Secondary | ICD-10-CM | POA: Diagnosis not present

## 2021-10-29 DIAGNOSIS — I129 Hypertensive chronic kidney disease with stage 1 through stage 4 chronic kidney disease, or unspecified chronic kidney disease: Secondary | ICD-10-CM | POA: Diagnosis not present

## 2021-10-29 DIAGNOSIS — N1832 Chronic kidney disease, stage 3b: Secondary | ICD-10-CM | POA: Diagnosis not present

## 2021-10-29 DIAGNOSIS — M359 Systemic involvement of connective tissue, unspecified: Secondary | ICD-10-CM | POA: Diagnosis not present

## 2021-10-29 DIAGNOSIS — Z7984 Long term (current) use of oral hypoglycemic drugs: Secondary | ICD-10-CM | POA: Diagnosis not present

## 2021-10-29 DIAGNOSIS — D631 Anemia in chronic kidney disease: Secondary | ICD-10-CM | POA: Diagnosis not present

## 2021-10-29 DIAGNOSIS — E1151 Type 2 diabetes mellitus with diabetic peripheral angiopathy without gangrene: Secondary | ICD-10-CM | POA: Diagnosis not present

## 2021-10-29 DIAGNOSIS — Z794 Long term (current) use of insulin: Secondary | ICD-10-CM | POA: Diagnosis not present

## 2021-10-29 DIAGNOSIS — E1100 Type 2 diabetes mellitus with hyperosmolarity without nonketotic hyperglycemic-hyperosmolar coma (NKHHC): Secondary | ICD-10-CM | POA: Diagnosis not present

## 2021-10-29 DIAGNOSIS — M069 Rheumatoid arthritis, unspecified: Secondary | ICD-10-CM | POA: Diagnosis not present

## 2021-10-29 DIAGNOSIS — D860 Sarcoidosis of lung: Secondary | ICD-10-CM | POA: Diagnosis not present

## 2021-10-29 DIAGNOSIS — E118 Type 2 diabetes mellitus with unspecified complications: Secondary | ICD-10-CM | POA: Diagnosis not present

## 2021-10-29 NOTE — Telephone Encounter (Signed)
Neurology appointment scheduled for 04/06/22 @ 10:00-Toni

## 2021-10-30 ENCOUNTER — Telehealth: Payer: Self-pay

## 2021-10-30 ENCOUNTER — Telehealth: Payer: Self-pay | Admitting: Nurse Practitioner

## 2021-10-30 NOTE — Chronic Care Management (AMB) (Signed)
°  Chronic Care Management   Note  10/30/2021 Name: [name] MRN: [id number] DOB: May 03, 1951  Chelsey [name] is a 70 y.o. year old female who is a primary care patient of [name], NP. I reached out to Chelsey [name] by phone today in response to a referral sent by Ms. Trinidad Curet Ting's PCP, [name], NP.   [name] was given information about Chronic Care Management services today including:  CCM service includes personalized support from designated clinical staff supervised by her physician, including individualized plan of care and coordination with other care providers 24/7 contact phone numbers for assistance for urgent and routine care needs. Service will only be billed when office clinical staff spend 20 minutes or more in a month to coordinate care. Only one practitioner may furnish and bill the service in a calendar month. The patient may stop CCM services at any time (effective at the end of the month) by phone call to the office staff.   [name] /SISTER verbally agreed to assistance and services provided by embedded care coordination/care management team today.  Follow up plan:   [name]

## 2021-10-30 NOTE — Telephone Encounter (Signed)
Pt called saying that she is out of medication  on a few of her medications and the pharmacy  said she was out of refills.  I called pharmacy and spoke to [facility] and she advised that pt recently had refills and that she can't get anymore til close to end of December.  I called and spoke to pt's sister and explained the information from the pharmacy and also that we can try to get pt set up with CCM to help manage her medications since pt was having issues with [facility] not coming to do pt's medications.  I spoke to [place] with CCM and pt has appt on 10/31/21 at 10 am

## 2021-10-31 ENCOUNTER — Other Ambulatory Visit: Payer: Self-pay

## 2021-10-31 ENCOUNTER — Ambulatory Visit: Payer: Medicare Other

## 2021-10-31 ENCOUNTER — Ambulatory Visit: Payer: Medicare Other | Admitting: Student-PharmD

## 2021-10-31 DIAGNOSIS — D869 Sarcoidosis, unspecified: Secondary | ICD-10-CM

## 2021-10-31 DIAGNOSIS — J449 Chronic obstructive pulmonary disease, unspecified: Secondary | ICD-10-CM | POA: Diagnosis not present

## 2021-10-31 DIAGNOSIS — E1165 Type 2 diabetes mellitus with hyperglycemia: Secondary | ICD-10-CM

## 2021-10-31 DIAGNOSIS — M1A00X0 Idiopathic chronic gout, unspecified site, without tophus (tophi): Secondary | ICD-10-CM

## 2021-10-31 DIAGNOSIS — N1831 Chronic kidney disease, stage 3a: Secondary | ICD-10-CM

## 2021-10-31 DIAGNOSIS — I10 Essential (primary) hypertension: Secondary | ICD-10-CM

## 2021-10-31 DIAGNOSIS — E782 Mixed hyperlipidemia: Secondary | ICD-10-CM

## 2021-10-31 NOTE — Progress Notes (Signed)
Initial Pharmacist CCM Visit  Chelsey [name], Chelsey [name] [id number] 74 years, Female  DOB: May 11, 1951  M: [phone]  Summary: Patient is out of several medications and has agreed to enroll in [facility] adherence system. Patient does get confused about which medications she is out of.  Recommendations/Changes made from today's visit: Patient enroll in [facility] for med sync and delivery. Patient will check blood pressure once weekly and keep a log.  Plan: F/U 03/11/22   Visit Details Patient scheduled for CCM visit with the clinical pharmacist.  Patient is referred for CCM by their PCP and CPP is under general PCP supervision.: At least 2 of these conditions are expected to last 12 months or longer and patient is at significant risk for acute exacerbations and/or functional decline.  Patient has consented to participation in [facility] program. Visit Type: Clinic visit Date of Upcoming Visit: 10/31/2021  Patient Chart Prep  Completed by Charlann Lange on 10/30/2021  Chronic Conditions Patient's Chronic Conditions: Hypertension (HTN), Diabetes (DM), Hyperlipidemia/Dyslipidemia (HLD), Hypothyroidism, Cardiovascular Disease (CVD), Chronic Pain, Chronic Kidney Disease (CKD), Gout, Sarcoidosis, Hyperparathyroidism  Doctor and Hospital Visits Were there PCP Visits in last 6 months?: Yes Visit #1: 05/28/21 [name], NP. For follow-up/labs. No medication changes.  Visit #2: 08/27/21 [name], NP. For Medicare wellness. CHANGED Hydroxychloroquine Sulfate to 200 mg 2 times daily, Omeprazole to 20 mg 1 capsule by mouth twice daily. STOPPED Simvastatin.  Visit #3: 10/02/21  [name], NP. For Acute metabolic encephalopathy. No medication changes.  Were there Specialist Visits in last 6 months?: Yes Visit #1: 05/26/21 Rheumatology Don Broach, MD./Ishizawar, Lisette Abu, MD. For Sarcoidosis/Gout.  Visit #2: 07/17/2021 Podiatry Gardiner Barefoot, DPM.  For nail  problem. No medication changes.  Visit #3: 10/20/2021 Endo Dostou, Joelyn Oms, MD. For diabetes. PER NOTE: Resume Trulicity 1.0CH once a week, we can increase to 69m next month, Continue 70/30 increase dose to 35 units before meals, Resume Libre, this sent to EDenzil HughesWas there a Hospital Visit in last 30 days?: No Were there other Hospital Visits in last 6 months?: Yes Visit #1: 09/16/21 [facility](10 days) ANolberto Hanlon MD.  For Acute metabolic encephalopathy. STOPPED Tresiba FlexTouch, torsemide, potassium chloride SA, insulin lispro, hydrALAZINE, colchicine.  Medication Information Are there any Medication discrepancies?: No Are there any Medication adherence gaps (beyond 5 days past due)?: Yes Details:  Atorvastatin 10 mg 30 DS 07/08/21, 06/09/21 Farxiga 10 mg 30 DS 06/24/21, 05/09/21 Losartan 50 mg 90 DS 06/16/21, 03/12/21   Medication adherence rates for the STAR rating drugs: Atorvastatin 10 mg 30 DS 07/08/21, 06/09/21 Farxiga 10 mg 30 DS 06/24/21, 05/09/21 Losartan 50 mg 90 DS 06/16/21, 03/12/21   List Patient's current Care Gaps: No current Care Gaps identified  Disease Assessments  Subjective Information Current BP: 136/74 Current HR: 73 taken on: 10/20/2021 Previous BP: 117/60 Previous HR: 71 taken on: 10/02/2021 Weight: 199 BMI: 33.12 Last GFR: 47 taken on: 09/15/2021 Why did the patient present?: CCM Initial Visit Marital status?: Single Details: lives in retirement community Retired? Previous work?: Retired and lives in retirement community What does the patient do during the day?: Relaxes at home, goes to games and activities with friends at retirement community Alcohol, tobacco, and illicit drug usage?: None What is the patient's sleep pattern?: No sleep issues How many hours per night does patient typically sleep?: 8 Patient pleased with health care they are receiving?: Yes Factors that may affect medication adherence?: Pharmacy issues Name and location  of Current pharmacy: [facility] Current Rx insurance plan: [employer] Are meds synced by current pharmacy?: No Are meds delivered by current pharmacy?: Yes - by local pharmacy Would patient benefit from direct intervention of clinical lead in dispensing process to optimize clinical outcomes?: Yes Are [facility] services available where patient lives?: Yes Is patient disadvantaged to use [facility]?: No [facility] services reviewed with patient and patient wishes to change pharmacy?: Yes - patient provided verbal consent to transfer prescriptions to [facility].  Patient made aware that CCM team is committed to working with any pharmacy and patient is free to select the pharmacy of their choice that best meets their needs.  Patient also made aware they may discontinue use of [facility] at any time if they feel the service is not meeting their personal needs. Reasons patient wishes to change to [facility]: Synchronization, Delivery of medication Dispensing Preference: Vials 30 DS Any additional demeanor/mood notes?: Patient is very update with [facility] because she is out of medications and insists she has not received them. Spoke with [facility] and they confirmed fill dates and that the delivery driver personally handed the medication to patient. Will work with [facility] to try and provide medications as soon as possible.  Hypertension (HTN) Assess this condition today?: Yes Is patient able to obtain BP reading today?: No Goal: <130/80 mmHG Hypertension Stage: Stage 1 (SBP: 130-139 or DBP: 80-89) Is Patient checking BP at home?: No How often does patient miss taking their blood pressure medications?: Patient states no missed doses Has patient experienced hypotension, dizziness, falls or bradycardia?: No Check present secondary causes (below) for HTN: CKD, Obesity BP RPM device: Does patient qualify?: No We discussed:  DASH diet:  following a diet emphasizing fruits and vegetables and low-fat dairy products along with whole grains, fish, poultry, and nuts. Reducing red meats and sugars., Targeting 150 minutes of aerobic activity per week, Reducing the amount of salt intake to <1525m/per day. Assessment:: Controlled Drug: Amlodipine 177m1 tablet daily Assessment: Appropriate, Effective, Safe, Accessible Drug: Losartan 5046m tablet daily Assessment: Appropriate, Effective, Safe, Accessible Plan to: Continue current medication; Begin checking blood pressure at home once weekly and keeping a log HC Follow up: 1 month Pharmacist Follow up: 03/11/22  Hyperlipidemia/Dyslipidemia  Last Lipid panel on: 11/21/2020 TC (Goal<200): 121 LDL: 70 HDL (Goal>40): 28 TG (Goal<150): 117 ASCVD 10-year risk?is:: N/A due to lab values outside the range Assess this condition today?: Yes LDL Goal: <70 Has patient tried and failed any HLD Medications?: No Check present secondary causes (below) that can lead to increased cholesterol levels (multi-choice optional): Diabetes, CKD We discussed: How to reduce cholesterol through diet/weight management and physical activity. Assessment:: Controlled Drug: Atorvastatin 68m78mtablet daily Assessment: Appropriate, Effective, Safe, Accessible Drug: Ezetimibe 68mg71mablet daily Assessment: Appropriate, Effective, Safe, Accessible Additional Info: Patient has not been taking ezetimibe, [facility] Plan to: Continue current medication HC Follow up: 1 month Pharmacist Follow up: 4/26  Diabetes (DM) Current A1C: 12.8 taken on: 09/15/2021 Previous A1C: 9.1 taken on: 02/26/2021 Type: 2 Last DM Foot Exam on: 12/30/2020 Last DM Eye Exam on: 02/13/2021 Assess this condition today?: Yes Goal A1C: < 7.0 % Is Patient taking statin medication?: Yes Is patient taking ACEi / ARB?: Yes DM RPM device: Does patient qualify?: No Patient checking Blood  Sugar at home?: Yes Does patient use a Continuous Glucose Monitoring (CGM) device?: Yes CGM Type:  FreeStyle 2 Elenor Legato Other Information: Patient just began using on 10/30/21. One fasting sugar to report was 185 Has patient experienced any hypoglycemic episodes?: No Diet recall discussed: Yes Breakfast: eggs, bologna, or leftovers from dinner, 2 cups of coffee Lunch: sandwich (loves bologna) Supper: Pork chop or cube steak w/ veggies Beverages: Lots of water We discussed: How to recognize and treat signs of hypoglycemia., Modifying lifestyle, including to participate in moderate physical activity (e.g., walking) at least 150 minutes per week., Low carbohydrate eating plan with an emphasis on whole grains, legumes, nuts, fruits, and vegetables and minimal refined and processed foods. Assessment:: Uncontrolled Drug: Farxiga 94m 1 tablet daily Assessment: Appropriate, Effective, Safe, Accessible Drug: Humulin 70/30 25 units into the skin twice daily Assessment: Appropriate, Effective, Safe, Accessible Drug: Trulicity 14.0JWinto the skin once weekly Assessment: Appropriate, Effective, Safe, Accessible Additional Info: Patient was just started back on Trulicity in last week Plan to (other): Continue medication HC Follow up: 1 month Pharmacist Follow up: 4/26  Gout Most recent uric acid: 4.1 taken on: 08/18/2020 Assess this condition today?: Yes When was your last Gout attack?: Cannot recall What diet changes have you made to reduce the frequency of your gout attacks?: Increase water intake, Limit seafood, Limit red meat / organ meats, Limit sodas / fruit juices, Eating more fruits / vegetables Have these changes been successful in reducing the frequency of the patient's gout attacks?: Yes We discussed: Limiting consumption of red meat and organ meat (liver, heart, kidneys), Limiting consumption of shellfish, anchovies, sardines and trout, Limiting / avoiding drinks high in sugar like fruit  juices and sodas, Maintaining a healthy weight and managing other comorbidities commonly associated with gout Assessment:: Controlled Drug: Allopurinol 307m1 tablet daily Assessment: Appropriate, Effective, Safe, Accessible Plan to (other): Continue current medication. HC Follow up: 1 month Pharmacist Follow up: 03/11/22  Chronic kidney disease (CKD) Previous GFR: 57 taken on: 09/19/21 The current microalbumin ratio is: <0.3 tested on: 11/21/2020 Assess this condition today?: Yes CKD Stage: Stage 3a (GFR 45-60 mL/min) Albuminuria Stage: A1 (<30) Contributing factors for developing CKD: Diabetes, HTN, Autoimmune disorder Is Patient taking statin medication: Yes Is patient taking ACEi / ARB?: Yes Renal dose adjustments recommended?: No We discussed: Maintaining blood pressure control, Maintaining blood glucose control, Limiting dietary sodium intake to less than 2000 mg / day Assessment:: Controlled Drug: Atorvastatin 1036m tablet daily Assessment: Appropriate, Effective, Safe, Accessible Drug: Losartan 29m66mtablet daily Assessment: Appropriate, Effective, Safe, Accessible Plan to: Continue current medications HC Follow up: 1 month Pharmacist Follow up: 03/11/22  Cardiovascular Disease (CVD) Assess this condition today?: Yes Is Patient taking statin medication?: Yes Is patient currently Smoking or Vaping?: No Did patient smoke/vape before?: Yes Details on prior smoking/vaping history: 24 year smoking history Is patient currently on antiplatelet therapy?: Yes Is patient experiencing any abnormal bruising or bleeding?: No Patient has the following: Peripheral Vascular Disease (PVD) Has Patient had ABIs?: Yes Date of ABI and results: 05/06/20 - mildly decreased blood flow Patient is experiencing the following symptoms: Rest pain We discussed: Importance of controlling other comorbid conditions Assessment:: Controlled Drug: Cilostazol 29mg62mab twice daily Assessment:  Appropriate, Effective, Safe, Accessible Drug: Aspirin 81mg 71mb daily Assessment: Appropriate, Effective, Safe, Accessible Plan to: Continue current medication and monitor symptoms HC Follow up: 1 month Pharmacist Follow up: 03/11/22  Hypothyroidism Current TSH: 3.480 taken on: 09/15/2021 Previous TSH: 3.630  taken on: 11/20/2019 Current T4: 0.94 taken on: 11/20/2019 Assess this condition today?:  Yes Patient has experienced the following symptoms: Hyperlipidemia We discussed: Proper administration of levothyroxine (empty stomach, 30 min before breakfast and with no other meds / vitamins), Monitoring for signs / symptoms of hypothyroidism (weight gain, fatigue, hair loss, cold intolerance), Monitoring signs / symptoms of hyperthyroidism (weight loss, tachycardia, anxiety, increased sweating) Assessment:: Controlled Drug: Levothyroxine 12mg 1 tab daily  Assessment: Appropriate, Effective, Safe, Accessible Plan to: Continue current medication HC Follow up: 1 month Pharmacist Follow up: 03/11/22 Additional exercise counseling points. We discussed: decreasing sedentary behavior, targeting at least 150 minutes per week of moderate-intensity aerobic exercise. Additional diet counseling points. We discussed: key components of the DASH diet, key components of a low-carb eating plan Discussed Non-Drug Care Coordination Needs: Yes Does Patient have Medication financial barriers?: No  [facility] Health-Related Social Needs Screening Tool -  SDOH  What is your living situation today? (ref #1): I have a steady place to live Think about the place you live. Do you have problems with any of the following? (ref #2): None of the above Within the past 12 months, you worried that your food would run out before you got money to buy more (ref #3): Never true Within the past 12 months, the food you bought just didn't last and you didn't have money to get more (ref #4): Never true In the  past 12 months, has lack of reliable transportation kept you from medical appointments, meetings, work or from getting things needed for daily living? (ref #5): Yes In the past 12 months, has the electric, gas, oil, or water company threatened to shut off services in your home? (ref #6): No How often does anyone, including family and friends, physically hurt you? (ref #7): Never (1) How often does anyone, including family and friends, insult or talk down to you? (ref #8): Never (1) How often does anyone, including friends and family, threaten you with harm? (ref #9): Never (1) How often does anyone, including family and friends, scream or curse at you? (ref #10): Never (1)  COMPREHENSIVE CARE PLAN AND GOALS:     HYPERTENSION   OFFICE VISIT BLOOD PRESSURE:   Unable to assess due to telephone visit..Marland Kitchen  HOME BLOOD PRESSURE MONITORING: Not taking, patient says she does not know the process  MY GOAL BLOOD PRESSURE: <130/80 mmHg  CURRENT MEDICATION AND DOSING:  Amlodipine 134m- take 1 tablet daily  Losartan 5035m take 1 tablet daily  BARRIERS TO ACHIEVING GOALS: Medication adherence   PLAN:  Continue current medications as prescribed. Provide handout on taking blood pressure at home     CHOLESTEROL   MOST RECENT LABS: 11/21/20  TOTAL CHOLESTEROL:  121  TRIGLYCERIDES: 117  HDL: 28  LDL:  70  CURRENT MEDICATION AND DOSING:  Atorvastatin 29m64mtake 1 tablet daily  Ezetimibe 29mg79make 1 tablet daily  THE GOALS WE HAVE CHOSEN ARE:  Total Cholesterol goal under 200, Triglycerides goal under 150, HDL above 50 (women), LDL goal under 100    BARRIERS TO ACHIEVING GOALS: Medication adherence  PLAN TO WORK ON THESE GOALS: Continue current medications as prescribed.       DIABETES  MOST RECENT A1C:  12.8 on 09/16/21  MY GOAL A1C: less than 7%  LAST FOOT EXAM: 12/30/20  LAST EYE EXAM: 02/14/21  CURRENT MEDICATION AND DOSING:  Farxiga 29mg 96mke 1 tab daily  Humulin 70/30 - Inject 25 units into  the skin twice daily Trulicity 1.5mg 4.5OPskin once weekly  THE GOALS WE HAVE CHOSEN ARE:   Maintain A1c < 7%  BARRIERS TO ACHIEVING GOALS: Medication adherence   PLAN TO WORK ON THESE GOALS: Patient just restarted on Trulicity   Peripheral Vascular Disease  CURRENT ASPIRIN REGIMEN AND DOSING:  Aspirin 81 mg - take one tablet once daily  Cilostazol 44m - take 1 tablet twice daily  THE GOALS WE HAVE CHOSEN ARE:  Monitor for symptoms of bleeding.     BARRIERS TO ACHIEVING GOALS: None identified   PLAN TO WORK ON THESE GOALS:  Continue therapy; Avoid over the counter medications that would increase risk of bleeding, such as NSAIDS (ibuprofen, naproxen, Goody's powder, others).      GOUT  CURRENT MEDICATION AND DOSING: Allopurinol 3060mtake 1 tablet by mouth daily  THE GOALS WE HAVE CHOSEN ARE:  Prevent gout flares and pain.  BARRIERS TO ACHIEVING GOALS: None identified  PLAN TO WORK ON THESE GOALS:  Continue current medications as prescribed.    HYPOTHYROIDISM  RECENT TSH/DATE:    09/16/2021 3.480 uIU/ml  CURRENT MEDICATION AND DOSING: Levothyroxine 5045m1 tablet daily  THE GOALS WE HAVE CHOSEN ARE:  Maintain TSH between 0.45 to 4.5uIU/ml  BARRIERS TO ACHIEVING GOALS: None identified   PLAN TO WORK ON THESE GOALS:  Continue current medications as prescribed.     CHRONIC KIDNEY DISEASE   Most recent GFR: 13m23mn on 09/19/21                   Previous GFR: 43 mL/min on 11/20/19  CURRENT MEDICATION AND DOSING:  Atorvastatin 10mg85mablet daily Losartan 50mg 38mblet daily  BARRIERS TO ACHIEVING GOALS: None identified   PLAN TO WORK ON GOALS:  Continue to monitor labs and adjust medications as needed.    HEALTHY HABITS (Diet and exercise)  CURRENT DIET/EXERCISE: drinks plenty of water, no exercise, tries to stay busy throughout the day  THE GOWoodstonay active, engage in at least 150 minutes per week of moderate-intensity exercise such as brisk walking (15- to  20-minute mile) or something similar. Increase flexibility exercises, break up prolonged periods of sitting Increase seafood, lean meats, whole grains, legumes, nuts, fruits (for dessert), and vegetables. Limit salt intake, sugars, carbs, and red meat, refined and processed foods.  BARRIERS TO ACHIEVING GOALS:  leg pains  PLAN TO WORK ON THESE GOALS:  Chair exercises    ACTIVE MEDICATION LIST  MEDICATION DOSE DIRECTIONS CONDITION NOTES  Albuterol HFA  108mcg 19mffs every 6 hours as needed breathing   Allopurinol 300mg 1 82met daily Gout   Amlodipine 10mg 1 t52mt daily Blood pressure   Aspirin EC 81mg  1 t59mt daily PVD   Atorvastatin  10mg 1 tab23mdaily Cholesterol   Cilostazol 50mg 1 tabl12mwice daily PVD   Ezetimibe 10mg 1 table47mily Cholesterol   Farxiga 10mg 1 tablet40mly Diabetes   Advair 250-50 1 puff twice daily Breathing   Gabapentin 300mg 1 cap thr52mimes daily Pain   Hydroxychloroquine 200mg  1 tablet 49me daily Sarcoidosis   Humulin 70/30  Inject 25 units into skin twice daily Diabetes   Levothyroxine 50mcg 1 tablet d22m Thyroid   Linzess 145mcg 1 tablet da24mStomach   Losartan 50mg 1 tablet dail55mood pressure   Omeprazole 20mg 1 twice daily 60m   Oxygen 3L Into lungs at nighttime breathing    MEDICATION REVIEW  MEDICATION REVIEW CONDUCTED:   Yes DATE:  10/31/2021 BEST  POSSIBLE MEDICATION HISTORY SOURCE:   Medical Records  PHARMACY [facility] VIALS OR PACKS: Vials  [facility] Agreed to Enroll   ALLERGIES/INTOLERANCES   NAME OF MEDICATION  REACTION    Percocet Hallucinations  PCN  Rash  Indomethacin Stomach burn      [place]   PROVIDER/TEAM MEMBER  ROLE  PHONE NUMBER  COMMENTS    [facility] Bigness Primary Care Provider [phone]    [place], [place] Pharmacist [phone]     How to Take Your Blood Pressure Blood pressure measures how strongly your blood is pressing against the walls of your arteries.  Arteries are blood vessels that carry blood from your heart throughout your body. You can take your blood pressure at home with a machine. You may need to check your blood pressure at home: To check if you have high blood pressure (hypertension). To check your blood pressure over time. To make sure your blood pressure medicine is working. Supplies needed: Blood pressure machine, or monitor. Dining room chair to sit in. Table or desk. Small notebook. Pencil or pen. How to prepare Avoid these things for 30 minutes before checking your blood pressure: Having drinks with caffeine in them, such as coffee or tea. Drinking alcohol. Eating. Smoking. Exercising. Do these things five minutes before checking your blood pressure: Go to the bathroom and pee (urinate). Sit in a dining chair. Do not sit on a soft couch or an armchair. Be quiet. Do not talk. How to take your blood pressure Follow the instructions that came with your machine. If you have a digital blood pressure monitor, these may be the instructions: Sit up straight. Place your feet on the floor. Do not cross your ankles or legs. Rest your left arm at the level of your heart. You may rest it on a table, desk, or chair. Pull up your shirt sleeve. Wrap the blood pressure cuff around the upper part of your left arm. The cuff should be 1 inch (2.5 cm) above your elbow. It is best to wrap the cuff around bare skin. Fit the cuff snugly around your arm. You should be able to place only one finger between the cuff and your arm. Place the cord so that it rests in the bend of your elbow. Press the power button. Sit quietly while the cuff fills with air and loses air. Write down the numbers on the screen. Wait 2-3 minutes and then repeat steps 1-10. What do the numbers mean? Two numbers make up your blood pressure. The first number is called systolic pressure. The second is called diastolic pressure. An example of a blood pressure reading  is "120 over 80" (or 120/80). If you are an adult and do not have a medical condition, use this guide to find out if your blood pressure is normal: Normal First number: below 120. Second number: below 80. Elevated First number: 120-129. Second number: below 80. Hypertension stage 1 First number: 130-139. Second number: 80-89. Hypertension stage 2 First number: 140 or above. Second number: 77 or above. Your blood pressure is above normal even if only the first or only the second number is above normal. Follow these instructions at home: Medicines Take over-the-counter and prescription medicines only as told by your doctor. Tell your doctor if your medicine is causing side effects. General instructions Check your blood pressure as often as your doctor tells you to. Check your blood pressure at the same time every day. Take your monitor to your next doctor's  appointment. Your doctor will: Make sure you are using it correctly. Make sure it is working right. Understand what your blood pressure numbers should be. Keep all follow-up visits as told by your doctor. This is important. General tips You will need a blood pressure machine, or monitor. Your doctor can suggest a monitor. You can buy one at a drugstore or online. When choosing one: Choose one with an arm cuff. Choose one that wraps around your upper arm. Only one finger should fit between your arm and the cuff. Do not choose one that measures your blood pressure from your wrist or finger. Where to find more information American Heart Association: www.heart.org Contact a doctor if: Your blood pressure keeps being high. Your blood pressure is suddenly low. Get help right away if: Your first blood pressure number is higher than 180. Your second blood pressure number is higher than 120. Summary Check your blood pressure at the same time every day. Avoid caffeine, alcohol, smoking, and exercise for 30 minutes before checking  your blood pressure. Make sure you understand what your blood pressure numbers should be. This information is not intended to replace advice given to you by your health care provider. Make sure you discuss any questions you have with your health care provider.  Exercises to do While Sitting Silhouette of a female drinking a glass of water with ice.  Exercises that you do while sitting (chair exercises) can give you many of the same benefits as full exercise. Benefits include strengthening your heart, burning calories, and keeping muscles and joints healthy. Exercise can also improve your mood and help with depression and anxiety. You may benefit from chair exercises if you are unable to do standing exercises due to: Diabetic foot pain. Obesity. Illness. Arthritis. Recovery from surgery or injury. Breathing problems. Balance problems. Another type of disability. Before starting chair exercises, check with your health care provider or a physical therapist to find out how much exercise you can tolerate and which exercises are safe for you. If your health care provider approves: Start out slowly and build up over time. Aim to work up to about 10-20 minutes for each exercise session. Make exercise part of your daily routine. Drink water when you exercise. Do not wait until you are thirsty. Drink every 10-15 minutes. Stop exercising right away if you have pain, nausea, shortness of breath, or dizziness. If you are exercising in a wheelchair, make sure to lock the wheels. Ask your health care provider whether you can do tai chi or yoga. Many positions in these mind-body exercises can be modified to do while seated. Warm-up Before starting other exercises: Sit up as straight as you can. Have your knees bent at 90 degrees, which is the shape of the capital letter "L." Keep your feet flat on the floor. Sit at the front edge of your chair, if you can. Pull in (tighten) the muscles in your abdomen  and stretch your spine and neck as straight as you can. Hold this position for a few minutes. Breathe in and out evenly. Try to concentrate on your breathing, and relax your mind. Stretching Exercise A: Arm stretch Hold your arms out straight in front of your body. Bend your hands at the wrist with your fingers pointing up, as if signaling someone to stop. Notice the slight tension in your forearms as you hold the position. Keeping your arms out and your hands bent, rotate your hands outward as far as you can and hold this  stretch. Aim to have your thumbs pointing up and your pinkie fingers pointing down. Slowly repeat arm stretches for one minute as tolerated. Exercise B: Leg stretch If you can move your legs, try to "draw" letters on the floor with the toes of your foot. Write your name with one foot. Write your name with the toes of your other foot. Slowly repeat the movements for one minute as tolerated. Exercise C: Reach for the sky Reach your hands as far over your head as you can to stretch your spine. Move your hands and arms as if you are climbing a rope. Slowly repeat the movements for one minute as tolerated. Range of motion exercises Exercise A: Shoulder roll Let your arms hang loosely at your sides. Lift just your shoulders up toward your ears, then let them relax back down. When your shoulders feel loose, rotate your shoulders in backward and forward circles. Do shoulder rolls slowly for one minute as tolerated. Exercise B: March in place As if you are marching, pump your arms and lift your legs up and down. Lift your knees as high as you can. If you are unable to lift your knees, just pump your arms and move your ankles and feet up and down. March in place for one minute as tolerated. Exercise C: Seated jumping jacks Let your arms hang down straight. Keeping your arms straight, lift them up over your head. Aim to point your fingers to the ceiling. While you lift your arms,  straighten your legs and slide your heels along the floor to your sides, as wide as you can. As you bring your arms back down to your sides, slide your legs back together. If you are unable to use your legs, just move your arms. Slowly repeat seated jumping jacks for one minute as tolerated. Strengthening exercises Exercise A: Shoulder squeeze Hold your arms straight out from your body to your sides, with your elbows bent and your fists pointed at the ceiling. Keeping your arms in the bent position, move them forward so your elbows and forearms meet in front of your face. Open your arms back out as wide as you can with your elbows still bent, until you feel your shoulder blades squeezing together. Hold for 5 seconds. Slowly repeat the movements forward and backward for one minute as tolerated. Contact a health care provider if: You have to stop exercising due to any of the following: Pain. Nausea. Shortness of breath. Dizziness. Fatigue. You have significant pain or soreness after exercising. Get help right away if: You have chest pain. You have difficulty breathing. These symptoms may represent a serious problem that is an emergency. Do not wait to see if the symptoms will go away. Get medical help right away. Call your local emergency services (911 in the [place] Do not drive yourself to the hospital. Summary Exercises that you do while sitting (chair exercises) can strengthen your heart, burn calories, and keep muscles and joints healthy. You may benefit from chair exercises if you are unable to do standing exercises due to diabetic foot pain, obesity, recovery from surgery or injury, or other conditions. Before starting chair exercises, check with your health care provider or a physical therapist to find out how much exercise you can tolerate and which exercises are safe for you. This information is not intended to replace advice given to you by your health care provider. Make sure you  discuss any questions you have with your health care provider.

## 2021-11-01 ENCOUNTER — Other Ambulatory Visit: Payer: Self-pay

## 2021-11-01 DIAGNOSIS — Z0001 Encounter for general adult medical examination with abnormal findings: Secondary | ICD-10-CM

## 2021-11-01 DIAGNOSIS — N186 End stage renal disease: Secondary | ICD-10-CM

## 2021-11-01 DIAGNOSIS — D860 Sarcoidosis of lung: Secondary | ICD-10-CM

## 2021-11-01 DIAGNOSIS — I10 Essential (primary) hypertension: Secondary | ICD-10-CM

## 2021-11-01 DIAGNOSIS — K219 Gastro-esophageal reflux disease without esophagitis: Secondary | ICD-10-CM

## 2021-11-01 DIAGNOSIS — K5900 Constipation, unspecified: Secondary | ICD-10-CM

## 2021-11-01 DIAGNOSIS — Z23 Encounter for immunization: Secondary | ICD-10-CM

## 2021-11-01 DIAGNOSIS — I739 Peripheral vascular disease, unspecified: Secondary | ICD-10-CM

## 2021-11-01 DIAGNOSIS — E782 Mixed hyperlipidemia: Secondary | ICD-10-CM

## 2021-11-01 DIAGNOSIS — R300 Dysuria: Secondary | ICD-10-CM

## 2021-11-01 DIAGNOSIS — E1122 Type 2 diabetes mellitus with diabetic chronic kidney disease: Secondary | ICD-10-CM

## 2021-11-01 DIAGNOSIS — E1165 Type 2 diabetes mellitus with hyperglycemia: Secondary | ICD-10-CM

## 2021-11-01 DIAGNOSIS — E559 Vitamin D deficiency, unspecified: Secondary | ICD-10-CM

## 2021-11-01 MED ORDER — ALLOPURINOL 300 MG PO TABS: 300.0000 mg | ORAL_TABLET | Freq: Every day | ORAL | 1 refills | Status: DC

## 2021-11-01 MED ORDER — ALBUTEROL SULFATE HFA 108 (90 BASE) MCG/ACT IN AERS: 2.0000 | INHALATION_SPRAY | Freq: Four times a day (QID) | RESPIRATORY_TRACT | 3 refills | Status: DC | PRN

## 2021-11-01 MED ORDER — DAPAGLIFLOZIN PROPANEDIOL 10 MG PO TABS: 10.0000 mg | ORAL_TABLET | Freq: Every day | ORAL | 1 refills | Status: DC

## 2021-11-01 MED ORDER — ATORVASTATIN CALCIUM 10 MG PO TABS: ORAL_TABLET | ORAL | 1 refills | Status: DC

## 2021-11-01 MED ORDER — EZETIMIBE 10 MG PO TABS: 10.0000 mg | ORAL_TABLET | Freq: Every day | ORAL | 1 refills | Status: DC

## 2021-11-01 MED ORDER — GABAPENTIN 300 MG PO CAPS: 300.0000 mg | ORAL_CAPSULE | Freq: Three times a day (TID) | ORAL | 1 refills | Status: DC

## 2021-11-01 MED ORDER — AMLODIPINE BESYLATE 10 MG PO TABS: 10.0000 mg | ORAL_TABLET | Freq: Every day | ORAL | 1 refills | Status: DC

## 2021-11-01 MED ORDER — ULTICARE SHORT PEN NEEDLES 31G X 8 MM MISC: 1 refills | Status: DC

## 2021-11-01 MED ORDER — LOSARTAN POTASSIUM 50 MG PO TABS: ORAL_TABLET | ORAL | 3 refills | Status: DC

## 2021-11-01 MED ORDER — CILOSTAZOL 50 MG PO TABS: 50.0000 mg | ORAL_TABLET | Freq: Two times a day (BID) | ORAL | 1 refills | Status: DC

## 2021-11-01 MED ORDER — LINACLOTIDE 145 MCG PO CAPS: 145.0000 ug | ORAL_CAPSULE | Freq: Every day | ORAL | 0 refills | Status: DC

## 2021-11-01 MED ORDER — HYDROXYCHLOROQUINE SULFATE 200 MG PO TABS: 200.0000 mg | ORAL_TABLET | Freq: Two times a day (BID) | ORAL | 1 refills | Status: DC

## 2021-11-01 MED ORDER — LEVOTHYROXINE SODIUM 50 MCG PO TABS: ORAL_TABLET | ORAL | 3 refills | Status: DC

## 2021-11-01 MED ORDER — ASPIRIN EC 81 MG PO TBEC: 81.0000 mg | DELAYED_RELEASE_TABLET | Freq: Every day | ORAL | 1 refills | Status: AC

## 2021-11-01 MED ORDER — INSULIN ISOPHANE & REGULAR (HUMAN 70-30)100 UNIT/ML KWIKPEN: 25.0000 [IU] | PEN_INJECTOR | Freq: Two times a day (BID) | SUBCUTANEOUS | 1 refills | Status: DC

## 2021-11-01 MED ORDER — FLUTICASONE-SALMETEROL 250-50 MCG/ACT IN AEPB: 1.0000 | INHALATION_SPRAY | Freq: Two times a day (BID) | RESPIRATORY_TRACT | 1 refills | Status: DC

## 2021-11-01 MED ORDER — OMEPRAZOLE 20 MG PO CPDR: DELAYED_RELEASE_CAPSULE | ORAL | 2 refills | Status: DC

## 2021-11-13 ENCOUNTER — Telehealth: Payer: Self-pay | Admitting: Student-PharmD

## 2021-11-13 NOTE — Progress Notes (Signed)
Chronic Care Management Pharmacy Assistant   Name: Chelsey [name]  MRN: [id number] DOB: 27-Nov-1950  Reason for Encounter: CCM Care Plan   Medications: Outpatient Encounter Medications as of 11/13/2021  Medication Sig   albuterol (VENTOLIN HFA) 108 (90 Base) MCG/ACT inhaler Inhale 2 puffs into the lungs every 6 (six) hours as needed for wheezing or shortness of breath.   allopurinol (ZYLOPRIM) 300 MG tablet Take 1 tablet (300 mg total) by mouth daily.   amLODipine (NORVASC) 10 MG tablet Take 1 tablet (10 mg total) by mouth daily.   aspirin EC 81 MG tablet Take 1 tablet (81 mg total) by mouth daily.   atorvastatin (LIPITOR) 10 MG tablet TAKE 1 TABLET BY MOUTH DAILY FOR CHOLESTEROL   Blood Glucose Monitoring Suppl (ACCU-CHEK AVIVA PLUS) W/DEVICE KIT Use as directed.   cilostazol (PLETAL) 50 MG tablet Take 1 tablet (50 mg total) by mouth 2 (two) times daily.   dapagliflozin propanediol (FARXIGA) 10 MG TABS tablet Take 1 tablet (10 mg total) by mouth daily.   ezetimibe (ZETIA) 10 MG tablet Take 1 tablet (10 mg total) by mouth daily.   fluticasone-salmeterol (ADVAIR) 250-50 MCG/ACT AEPB Inhale 1 puff into the lungs in the morning and at bedtime.   gabapentin (NEURONTIN) 300 MG capsule Take 1 capsule (300 mg total) by mouth 3 (three) times daily.   glucose blood test strip Test sugar 4 x aday for uncontrolled dm on insulin E11.22   hydroxychloroquine (PLAQUENIL) 200 MG tablet Take 1 tablet (200 mg total) by mouth 2 (two) times daily.   insulin isophane & regular human KwikPen (HUMULIN 70/30 MIX) (70-30) 100 UNIT/ML KwikPen Inject 25 Units into the skin 2 (two) times daily.   Insulin Pen Needle (ULTICARE SHORT PEN NEEDLES) 31G X 8 MM MISC USE 3 TIMES A DAY   LANCETS ULTRA THIN MISC Apply 1 each topically daily.    levothyroxine (SYNTHROID) 50 MCG tablet Take 1 tablet in the morning on a empty stomach   linaclotide (LINZESS) 145 MCG CAPS capsule Take 1 capsule (145 mcg total) by mouth daily.    losartan (COZAAR) 50 MG tablet TAKE 1 TABLET BY MOUTH DAILY FOR HYPENTENSION   omeprazole (PRILOSEC) 20 MG capsule TAKE ONE CAPSULE BY MOUTH TWICE A DAY FOR GERD   OXYGEN Inhale 3 L into the lungs. Nighttime use   traZODone (DESYREL) 50 MG tablet TAKE ONE-HALF TABLET BY MOUTH AT BEDTIME AS NEEDED FOR SLEEP   No facility-administered encounter medications on file as of 11/13/2021.    Reviewed the patients initial visit reinsured it was completed per the pharmacist Alena Bills request. Printed the CCM care plan. Mailed the patient CCM care plan to their most recent address on file.  Follow-Up:Pharmacist Review  Charlann Lange, [place] Pharmacist Assistant [phone]

## 2021-11-14 DIAGNOSIS — E1165 Type 2 diabetes mellitus with hyperglycemia: Secondary | ICD-10-CM

## 2021-11-14 DIAGNOSIS — N1831 Chronic kidney disease, stage 3a: Secondary | ICD-10-CM

## 2021-11-14 DIAGNOSIS — I10 Essential (primary) hypertension: Secondary | ICD-10-CM

## 2021-11-19 DIAGNOSIS — E119 Type 2 diabetes mellitus without complications: Secondary | ICD-10-CM | POA: Diagnosis not present

## 2021-11-20 ENCOUNTER — Ambulatory Visit: Payer: Medicare Other | Admitting: Podiatry

## 2021-11-25 ENCOUNTER — Other Ambulatory Visit: Payer: Self-pay

## 2021-11-25 ENCOUNTER — Ambulatory Visit (INDEPENDENT_AMBULATORY_CARE_PROVIDER_SITE_OTHER): Payer: Medicare Other | Admitting: Nurse Practitioner

## 2021-11-25 ENCOUNTER — Encounter: Payer: Self-pay | Admitting: Nurse Practitioner

## 2021-11-25 VITALS — BP 120/68 | HR 64 | Temp 98.1°F | Resp 16 | Ht 63.0 in | Wt 203.8 lb

## 2021-11-25 DIAGNOSIS — I10 Essential (primary) hypertension: Secondary | ICD-10-CM

## 2021-11-25 DIAGNOSIS — E1165 Type 2 diabetes mellitus with hyperglycemia: Secondary | ICD-10-CM

## 2021-11-25 DIAGNOSIS — N1831 Chronic kidney disease, stage 3a: Secondary | ICD-10-CM

## 2021-11-25 DIAGNOSIS — Z794 Long term (current) use of insulin: Secondary | ICD-10-CM | POA: Diagnosis not present

## 2021-11-25 DIAGNOSIS — M1A00X0 Idiopathic chronic gout, unspecified site, without tophus (tophi): Secondary | ICD-10-CM

## 2021-11-25 MED ORDER — FEBUXOSTAT 40 MG PO TABS: 40.0000 mg | ORAL_TABLET | Freq: Every day | ORAL | 2 refills | Status: DC

## 2021-11-25 NOTE — Progress Notes (Signed)
[facility] [place], [place] [address]  Internal MEDICINE  Office Visit Note  Patient Name: Chelsey [name]  [id number]  [id number]  Date of Service: 11/25/2021  Chief Complaint  Patient presents with   Hospitalization Follow-up   Diabetes    HPI Shayonna presents for a follow up visit for diabetes. She also has gout and has decreased kidney function and needs an alternative for prevention of gout attacks. She is currently on allopurinol and colchicine.     Current Medication: Outpatient Encounter Medications as of 11/25/2021  Medication Sig   albuterol (VENTOLIN HFA) 108 (90 Base) MCG/ACT inhaler Inhale 2 puffs into the lungs every 6 (six) hours as needed for wheezing or shortness of breath.   amLODipine (NORVASC) 10 MG tablet Take 1 tablet (10 mg total) by mouth daily.   aspirin EC 81 MG tablet Take 1 tablet (81 mg total) by mouth daily.   atorvastatin (LIPITOR) 10 MG tablet TAKE 1 TABLET BY MOUTH DAILY FOR CHOLESTEROL   Blood Glucose Monitoring Suppl (ACCU-CHEK AVIVA PLUS) W/DEVICE KIT Use as directed.   cilostazol (PLETAL) 50 MG tablet Take 1 tablet (50 mg total) by mouth 2 (two) times daily.   dapagliflozin propanediol (FARXIGA) 10 MG TABS tablet Take 1 tablet (10 mg total) by mouth daily.   ezetimibe (ZETIA) 10 MG tablet Take 1 tablet (10 mg total) by mouth daily.   febuxostat (ULORIC) 40 MG tablet Take 1 tablet (40 mg total) by mouth daily.   fluticasone-salmeterol (ADVAIR) 250-50 MCG/ACT AEPB Inhale 1 puff into the lungs in the morning and at bedtime.   gabapentin (NEURONTIN) 300 MG capsule Take 1 capsule (300 mg total) by mouth 3 (three) times daily.   glucose blood test strip Test sugar 4 x aday for uncontrolled dm on insulin E11.22   hydroxychloroquine (PLAQUENIL) 200 MG tablet Take 1 tablet (200 mg total) by mouth 2 (two) times daily.   insulin isophane & regular human KwikPen (HUMULIN 70/30 MIX) (70-30) 100 UNIT/ML KwikPen Inject 25 Units into the  skin 2 (two) times daily.   Insulin Pen Needle (ULTICARE SHORT PEN NEEDLES) 31G X 8 MM MISC USE 3 TIMES A DAY   LANCETS ULTRA THIN MISC Apply 1 each topically daily.    levothyroxine (SYNTHROID) 50 MCG tablet Take 1 tablet in the morning on a empty stomach   linaclotide (LINZESS) 145 MCG CAPS capsule Take 1 capsule (145 mcg total) by mouth daily.   losartan (COZAAR) 50 MG tablet TAKE 1 TABLET BY MOUTH DAILY FOR HYPENTENSION   omeprazole (PRILOSEC) 20 MG capsule TAKE ONE CAPSULE BY MOUTH TWICE A DAY FOR GERD   OXYGEN Inhale 3 L into the lungs. Nighttime use   traZODone (DESYREL) 50 MG tablet TAKE ONE-HALF TABLET BY MOUTH AT BEDTIME AS NEEDED FOR SLEEP   [DISCONTINUED] allopurinol (ZYLOPRIM) 300 MG tablet Take 1 tablet (300 mg total) by mouth daily.   No facility-administered encounter medications on file as of 11/25/2021.    Surgical History: Past Surgical History:  Procedure Laterality Date   BRAIN SURGERY  1990's   CATARACT EXTRACTION W/PHACO Left 06/11/2016   Procedure: CATARACT EXTRACTION PHACO AND INTRAOCULAR LENS PLACEMENT (IOC);  Surgeon: Birder Robson, MD;  Location: [facility] ORS;  Service: Ophthalmology;  Laterality: Left;  [place] 1.01 AP% 21.6 CDE 13.23 Fluid pack lot # [id number]   CEREBRAL ANEURYSM REPAIR  1990's   COILS   CORONARY ARTERY BYPASS GRAFT     EYE SURGERY  2000's  bilateral cataract   FRACTURE SURGERY     HERNIA REPAIR  2000   ventral   STENT PLACEMENT VASCULAR ([place] HX)     VEIN BYPASS SURGERY     VENTRAL HERNIA REPAIR N/A 04/29/2016   Procedure: Laparoscopic HERNIA REPAIR VENTRAL ADULT;  Surgeon: Jules Husbands, MD;  Location: [facility] ORS;  Service: General;  Laterality: N/A;    Medical History: Past Medical History:  Diagnosis Date   Anemia    Aneurysm ([place])    Arthritis    RHEUMATOID ARTHRITIS   Asthma    Collagen vascular disease ([place])    COPD (chronic obstructive pulmonary disease) ([place])    Coronary artery disease    Dementia ([place])    Diabetes mellitus  without complication (HCC)    Edema    FEET/LEGS   GERD (gastroesophageal reflux disease)    Gout    H/O wheezing    History of hiatal hernia    Hypertension    Hypothyroidism    Neuropathy    Oxygen deficiency    HS   Peripheral vascular disease (HCC)    Sarcoidosis of lung (HCC)    Seizures (HCC)    WITH BRAIN 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    Sleep apnea    OXYGEN AT NIGHT 3 L [place]    Family History: Family History  Problem Relation Age of Onset   Heart disease Mother    Hypertension Mother    Diabetes Mother    Diabetes Father    Heart disease Father    Hypertension Father    Breast cancer Maternal Aunt     Social History   Socioeconomic History   Marital status: Single    Spouse name: Not on file   Number of children: Not on file   Years of education: Not on file   Highest education level: Not on file  Occupational History   Not on file  Tobacco Use   Smoking status: Former    Packs/day: 1.00    Years: 30.00    Pack years: 30.00    Types: Cigarettes    Quit date: 01/27/2006    Years since quitting: 15.8   Smokeless tobacco: Never   Tobacco comments:    quit   Substance and Sexual Activity   Alcohol use: No    Alcohol/week: 0.0 standard drinks   Drug use: No   Sexual activity: Not on file  Other Topics Concern   Not on file  Social History Narrative   Not on file   Social Determinants of Health   Financial Resource Strain: Not on file  Food Insecurity: Not on file  Transportation Needs: Not on file  Physical Activity: Not on file  Stress: Not on file  Social Connections: Not on file  Intimate Partner Violence: Not on file      Review of Systems  Constitutional:  Negative for chills, fatigue and unexpected weight change.  HENT:  Negative for congestion, rhinorrhea, sneezing and sore throat.   Eyes:  Negative for redness.  Respiratory:  Negative for cough, chest tightness and shortness of breath.   Cardiovascular:  Negative for chest pain  and palpitations.  Gastrointestinal:  Negative for abdominal pain, constipation, diarrhea, nausea and vomiting.  Genitourinary:  Negative for dysuria and frequency.  Musculoskeletal:  Positive for arthralgias and joint swelling. Negative for back pain and neck pain.  Skin:  Negative for rash.  Neurological: Negative.  Negative for tremors and numbness.  Hematological:  Negative for adenopathy. Does not  bruise/bleed easily.  Psychiatric/Behavioral:  Negative for behavioral problems (Depression), sleep disturbance and suicidal ideas. The patient is not nervous/anxious.    Vital Signs: BP 120/68    Pulse 64    Temp 98.1 F (36.7 C)    Resp 16    Ht _0  (1.6 m)    Wt 203 lb 12.8 oz (92.4 kg)    SpO2 97%    BMI 36.10 kg/m    Physical Exam Vitals reviewed.  Constitutional:      General: She is not in acute distress.    Appearance: Normal appearance. She is obese. She is not ill-appearing.  HENT:     Head: Normocephalic and atraumatic.  Eyes:     Pupils: Pupils are equal, round, and reactive to light.  Cardiovascular:     Rate and Rhythm: Normal rate and regular rhythm.  Pulmonary:     Effort: No respiratory distress.  Neurological:     Mental Status: She is alert and oriented to person, place, and time.     Cranial Nerves: No cranial nerve deficit.     Coordination: Coordination normal.     Gait: Gait normal.  Psychiatric:        Mood and Affect: Mood normal.        Behavior: Behavior normal.       Assessment/Plan: 1. Chronic gouty arthropathy without tophi Samples of uloric provided, prescription sent. May continue colchicine for acute flares. Discontinue allopurinol. Uric acid level lab ordered.  - febuxostat (ULORIC) 40 MG tablet; Take 1 tablet (40 mg total) by mouth daily.  Dispense: 30 tablet; Refill: 2 - Uric acid  2. Type 2 diabetes mellitus with hyperglycemia, with long-term current use of insulin (HCC) Last A1C is 12.8 in November. She is seeing endocrinology.    3. Essential hypertension Stable with current medications.   4. Chronic kidney disease, stage 3a (HCC) Decreased kidney function, allopurinol discontinued.    General Counseling: Soniyah verbalizes understanding of the findings of todays visit and agrees with plan of treatment. I have discussed any further diagnostic evaluation that may be needed or ordered today. We also reviewed her medications today. she has been encouraged to call the office with any questions or concerns that should arise related to todays visit.    Orders Placed This Encounter  Procedures   Uric acid    Meds ordered this encounter  Medications   febuxostat (ULORIC) 40 MG tablet    Sig: Take 1 tablet (40 mg total) by mouth daily.    Dispense:  30 tablet    Refill:  2    Discontinue allopurinol, start febuxostat, patient given 2 weeks of samples.    Return in about 3 months (around 02/23/2022) for F/U, Yasha Tibbett PCP.   Total time spent:30 Minutes Time spent includes review of chart, medications, test results, and follow up plan with the patient.   [place] Controlled Substance Database was reviewed by me.  This patient was seen by [name], FNP-C in collaboration with Dr. Clayborn Bigness as a part of collaborative care agreement.   Josede Cicero R. Valetta Fuller, MSN, FNP-C Internal medicine

## 2021-11-27 ENCOUNTER — Telehealth: Payer: Self-pay

## 2021-11-27 NOTE — Telephone Encounter (Signed)
Completed medical records for [facility] Faxed payment request of $49.50 and records to [phone] (P) [phone]

## 2021-11-29 DIAGNOSIS — Z794 Long term (current) use of insulin: Secondary | ICD-10-CM | POA: Diagnosis not present

## 2021-11-29 DIAGNOSIS — E118 Type 2 diabetes mellitus with unspecified complications: Secondary | ICD-10-CM | POA: Diagnosis not present

## 2021-12-01 ENCOUNTER — Other Ambulatory Visit: Payer: Self-pay

## 2021-12-01 ENCOUNTER — Telehealth: Payer: Self-pay | Admitting: Student-PharmD

## 2021-12-01 ENCOUNTER — Ambulatory Visit (INDEPENDENT_AMBULATORY_CARE_PROVIDER_SITE_OTHER): Payer: Medicare Other | Admitting: Podiatry

## 2021-12-01 ENCOUNTER — Encounter: Payer: Self-pay | Admitting: Podiatry

## 2021-12-01 DIAGNOSIS — E1159 Type 2 diabetes mellitus with other circulatory complications: Secondary | ICD-10-CM

## 2021-12-01 DIAGNOSIS — B351 Tinea unguium: Secondary | ICD-10-CM | POA: Diagnosis not present

## 2021-12-01 DIAGNOSIS — M79676 Pain in unspecified toe(s): Secondary | ICD-10-CM

## 2021-12-01 DIAGNOSIS — I739 Peripheral vascular disease, unspecified: Secondary | ICD-10-CM

## 2021-12-01 NOTE — Progress Notes (Signed)
This patient returns to my office for at risk foot care.  This patient requires this care by a professional since this patient will be at risk due to having  Type 2 diabetes and peripheral vascular disease.  .  This patient is unable to cut nails herself since the patient cannot reach her nails.These nails are painful walking and wearing shoes.  This patient presents for at risk foot care today.  General Appearance  Alert, conversant and in no acute stress.  Vascular  Dorsalis pedis and posterior tibial  pulses are barely  palpable  bilaterally.  Capillary return is within normal limits  bilaterally. Temperature is within normal limits  bilaterally.  Neurologic  Senn-Weinstein monofilament wire test diminished  bilaterally. Muscle power within normal limits bilaterally.  Nails Thick disfigured discolored nails with subungual debris  from hallux to fifth toes bilaterally. No evidence of bacterial infection or drainage bilaterally.  Orthopedic  No limitations of motion  feet .  No crepitus or effusions noted.  No bony pathology or digital deformities noted.  Pes planus and HAV  B/L.  DJD STJ left foot.  Skin  normotropic skin with no porokeratosis noted bilaterally.  No signs of infections or ulcers noted.   Pinch callus  left foot  Thin shiny skin  Onychomycosis  Pain in right toes  Pain in left toes    Consent was obtained for treatment procedures.   Mechanical debridement of nails 1-5  bilaterally performed with a nail nipper.  Filed with dremel without incident.     Return office visit    3 months                  Told patient to return for periodic foot care and evaluation due to potential at risk complications.   Gardiner Barefoot DPM

## 2021-12-01 NOTE — Progress Notes (Addendum)
General Review [facility]) Completed by Charlann Lange on 12/01/2021  Chart Review What recent interventions/DTPs have been made by any provider to improve the patient's conditions in the last 3 months?:  Office Visits: 11/25/21 [name], NP. For hospital follow-up. STOPPED Allopurinol STARTED Febuxostat 40 mg daily.  Consults: 12/01/21 Podiatry Gardiner Barefoot, DPM. For nail problem. No medication changes.  Any recent hospitalizations or ED visits since last visit with CPP?: No  Adherence Review Adherence rates for STAR metric medications:  Trulicity 1.5 ZC/5.8 ML SOPN 11/14/21 28 DS, 10/23/21 28 DS. Atorvastatin 19 mg 11/27/21 14 DS , 10/31/21 30 DS. Farxiga 10 mg 10/28/21 30 DS Losartan 50 mg 11/12/20 21 DS , 06/16/21 90 DS  Adherence rates for medications indicated for disease state being reviewed:  Trulicity 1.5 IF/0.2 ML SOPN 11/14/21 28 DS, 10/23/21 28 DS. Atorvastatin 19 mg 11/27/21 14 DS , 10/31/21 30 DS. Farxiga 10 mg 10/28/21 30 DS Losartan 50 mg 11/12/21 21 DS , 06/16/21 90 DS  Does the patient have >5 day gap between last estimated fill dates for any of the above medications?: No  Disease State Questions Able to connect with the Patient?: Yes  Did patient have any problems with their health recently?: No  Did patient have any problems with their pharmacy?: No  Does patient have any issues or side effects with their medications?: No  Charlann Lange, RMA Clinical Pharmacist Assistant [phone]  Pharmacist Review Adherence gaps identified?: No Drug Therapy Problems identified?: No Assessment: Controlled  5 minutes spent in review, coordination, and documentation.  Reviewed by: Alena Bills, PharmD Clinical Pharmacist [phone]

## 2021-12-01 NOTE — Progress Notes (Signed)
Chronic Care Management Pharmacy Assistant   Name: Chelsey [name]  MRN: [id number] DOB: [birth year]  Reason for Encounter: Medication Review/Medication Coordination Call  Medications: Outpatient Encounter Medications as of 12/01/2021  Medication Sig   albuterol (VENTOLIN HFA) 108 (90 Base) MCG/ACT inhaler Inhale 2 puffs into the lungs every 6 (six) hours as needed for wheezing or shortness of breath.   amLODipine (NORVASC) 10 MG tablet Take 1 tablet (10 mg total) by mouth daily.   aspirin EC 81 MG tablet Take 1 tablet (81 mg total) by mouth daily.   atorvastatin (LIPITOR) 10 MG tablet TAKE 1 TABLET BY MOUTH DAILY FOR CHOLESTEROL   Blood Glucose Monitoring Suppl (ACCU-CHEK AVIVA PLUS) W/DEVICE KIT Use as directed.   cilostazol (PLETAL) 50 MG tablet Take 1 tablet (50 mg total) by mouth 2 (two) times daily.   dapagliflozin propanediol (FARXIGA) 10 MG TABS tablet Take 1 tablet (10 mg total) by mouth daily.   Dulaglutide (TRULICITY) 1.5 TK/3.5WS SOPN Inject 1.108m subcutaneous once a week for one month. After 1 month increase to 334m   ezetimibe (ZETIA) 10 MG tablet Take 1 tablet (10 mg total) by mouth daily.   febuxostat (ULORIC) 40 MG tablet Take 1 tablet (40 mg total) by mouth daily.   fluticasone-salmeterol (ADVAIR) 250-50 MCG/ACT AEPB Inhale 1 puff into the lungs in the morning and at bedtime.   gabapentin (NEURONTIN) 300 MG capsule Take 1 capsule (300 mg total) by mouth 3 (three) times daily.   glucose blood test strip Test sugar 4 x aday for uncontrolled dm on insulin E11.22   hydroxychloroquine (PLAQUENIL) 200 MG tablet Take 1 tablet (200 mg total) by mouth 2 (two) times daily.   insulin isophane & regular human KwikPen (HUMULIN 70/30 MIX) (70-30) 100 UNIT/ML KwikPen Inject 25 Units into the skin 2 (two) times daily.   Insulin Pen Needle (ULTICARE SHORT PEN NEEDLES) 31G X 8 MM MISC USE 3 TIMES A DAY   LANCETS ULTRA THIN MISC Apply 1 each topically daily.    levothyroxine (SYNTHROID) 50 MCG  tablet Take 1 tablet in the morning on a empty stomach   linaclotide (LINZESS) 145 MCG CAPS capsule Take 1 capsule (145 mcg total) by mouth daily.   losartan (COZAAR) 50 MG tablet TAKE 1 TABLET BY MOUTH DAILY FOR HYPENTENSION   omeprazole (PRILOSEC) 20 MG capsule TAKE ONE CAPSULE BY MOUTH TWICE A DAY FOR GERD   OXYGEN Inhale 3 L into the lungs. Nighttime use   traZODone (DESYREL) 50 MG tablet TAKE ONE-HALF TABLET BY MOUTH AT BEDTIME AS NEEDED FOR SLEEP   No facility-administered encounter medications on file as of 12/01/2021.   Reviewed chart for medication changes ahead of medication coordination call.  Office Visits: 11/25/21 Chelsey OsgoodNP. For hospital follow-up. STOPPED Alopurinol STARTED Febuxostat 40 mg daily.    Consults: 12/01/21 Podiatry MaGardiner BarefootDPM. For nail problem. No medication changes.   BP Readings from Last 3 Encounters:  11/25/21 120/68  10/02/21 117/60  09/26/21 (!) 151/86    Lab Results  Component Value Date   HGBA1C 12.8 (H) 09/16/2021     Patient obtains medications through Vials  30 Days   Last adherence delivery included: N/A.  Patient declined meds last month: N/A.  Patient is due for next adherence delivery on: 12/11/21.  Called patient and reviewed medications and coordinated delivery.  This delivery to include: Amlodipine 10 mg 1 tablet once daily Atorvastatin 10 mg 1 tablet once daily Cilostazol 50 mg 1 tablet twice daily  Ezetimibe 10 mg 1 tablet once daily Farxiga 10 mg 1 tablet once daily Advair 250-50 inhale 1 puff in the morning and at bedtime Gabapentin 300 mg 1 capsule three times daily Hydroxychloroquine 200 mg 1 tablet twice daily Levothyroxine 50 MCG 1 tablet every morning on an empty stomach Losartan 50 mg 1 tablet once daily  Omeprazole 20 mg 1 capsule twice daily Febuxostat 40 mg 1 tablet once daily   Patient declined the following medications: None.  Patient does not need refills at this time.  Confirmed  delivery date of 12/11/21, advised patient that pharmacy will contact them the morning of delivery.   Charlann Lange, [place] Clinical Pharmacist Assistant [phone]

## 2021-12-03 DIAGNOSIS — M1A00X0 Idiopathic chronic gout, unspecified site, without tophus (tophi): Secondary | ICD-10-CM | POA: Diagnosis not present

## 2021-12-04 LAB — URIC ACID: Uric Acid: 2.7 mg/dL — ABNORMAL LOW (ref 3.1–7.9)

## 2021-12-14 DIAGNOSIS — I10 Essential (primary) hypertension: Secondary | ICD-10-CM | POA: Diagnosis not present

## 2021-12-14 DIAGNOSIS — E1165 Type 2 diabetes mellitus with hyperglycemia: Secondary | ICD-10-CM | POA: Diagnosis not present

## 2021-12-14 DIAGNOSIS — N1831 Chronic kidney disease, stage 3a: Secondary | ICD-10-CM | POA: Diagnosis not present

## 2021-12-19 IMAGING — MR MR HEAD WO/W CM
13 series · 48 of 48 positions shown · IV contrast (gadavist)
Comparison: 09/16/2021 CT head, no prior MRI head

CLINICAL DATA: Mental status change, unknown cause

EXAM:
MRI HEAD WITHOUT AND WITH CONTRAST
TECHNIQUE: Multiplanar, multiecho pulse sequences of the brain and surrounding
structures were obtained without and with intravenous contrast.
CONTRAST:  9mL GADAVIST GADOBUTROL 1 MMOL/ML IV SOLN

[Series 5: ax dwi_tracew · axial · 3.0mm · 0.65mm/px · z∈[-119,+36]mm · 3 of 48 slices shown]
[im 1/48]
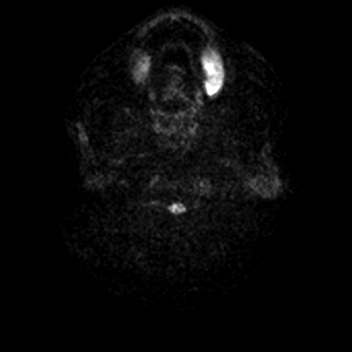
[im 24/48]
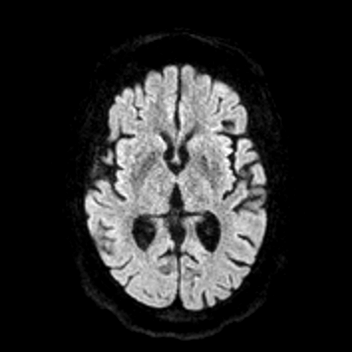
[im 48/48]
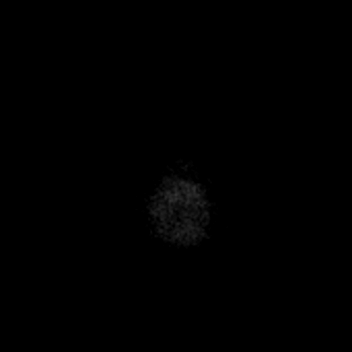

[Series 6: ax dwi_adc · axial · 3.0mm · 0.65mm/px · z∈[-119,+30]mm · 3 of 46 slices shown]
[im 1/46]
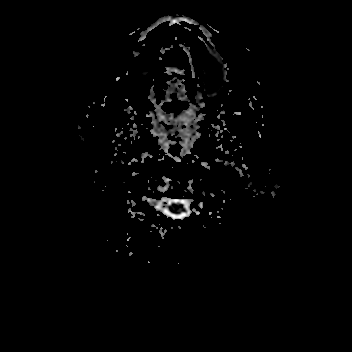
[im 23/46]
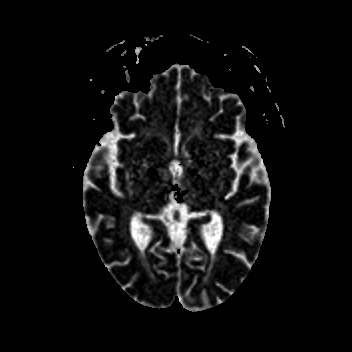
[im 46/46]
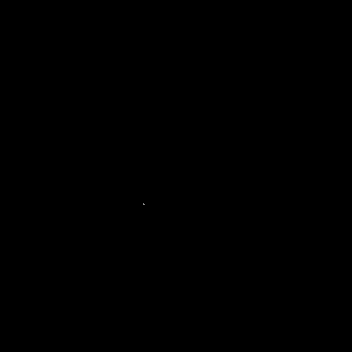

[Series 7: cor dwi_tracew · coronal · 5.0mm · 0.65mm/px · 2 of 40 slices shown]
[im 1/40]
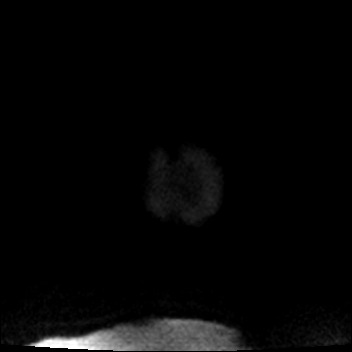
[im 40/40]
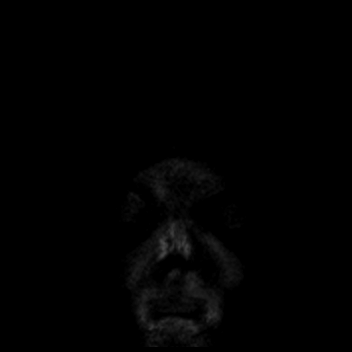

[Series 8: cor dwi_adc · coronal · 5.0mm · 0.65mm/px · 2 of 37 slices shown]
[im 1/37]
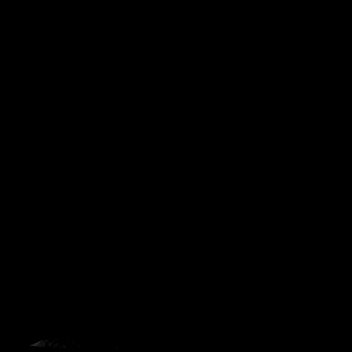
[im 37/37]
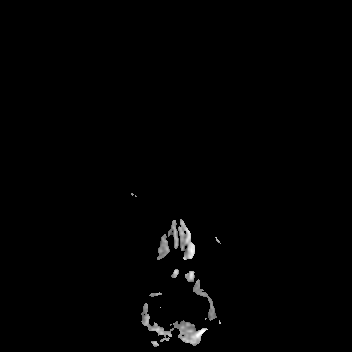

[Series 9: T1 · sagittal · 5.0mm · 0.62mm/px · 1 of 21 slices shown (1 of 2)]
[im 1/21]
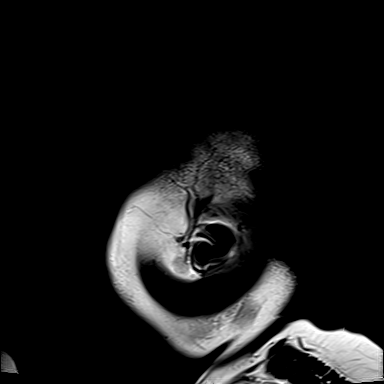

[Series 10: T2 · axial · 5.0mm · 0.53mm/px · z∈[-110,+34]mm · 2 of 25 slices shown]
[im 1/25]
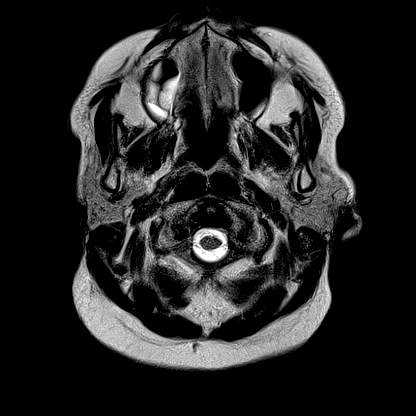
[im 25/25]
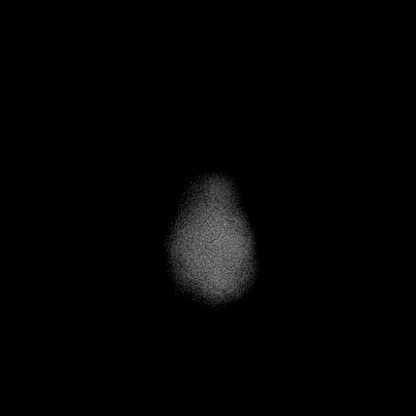

[Series 12: pha_images · axial · 3.0mm · 0.90mm/px · z∈[-114,+39]mm · 3 of 52 slices shown]
[im 1/52]
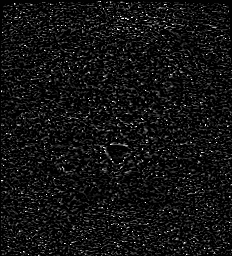
[im 26/52]
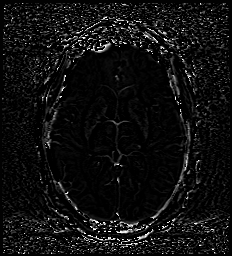
[im 52/52]
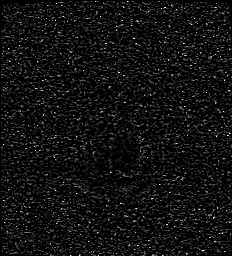

[Series 13: swi_images · axial · 3.0mm · 0.90mm/px · z∈[-114,+39]mm · 3 of 52 slices shown]
[im 1/52]
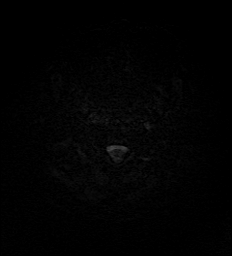
[im 26/52]
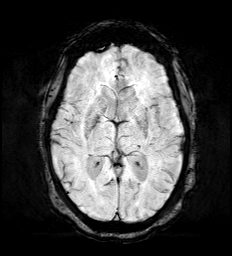
[im 52/52]
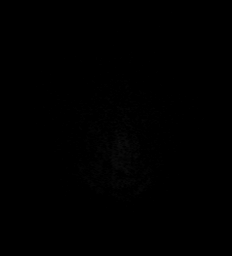

[Series 15: FLAIR · axial · 3.0mm · 0.53mm/px · z∈[-119,+43]mm · 3 of 55 slices shown]
[im 1/55]
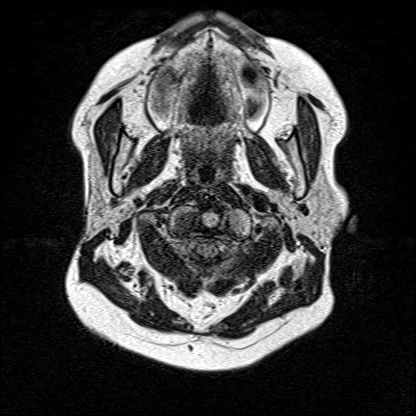
[im 28/55]
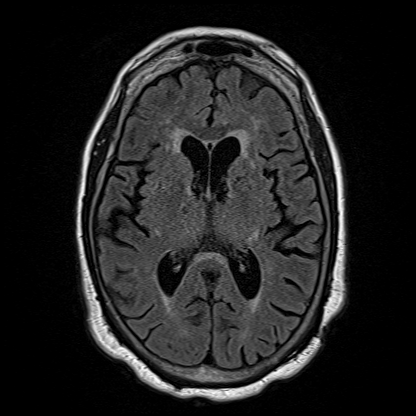
[im 55/55]
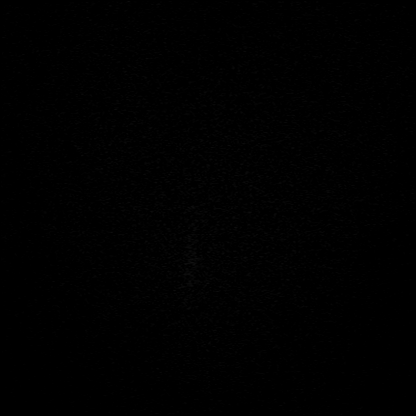

[Series 16: T1 · axial · 1.0mm · 0.98mm/px · z∈[-121,+54]mm · 11 of 175 slices shown (2 of 2)]
[im 1/175]
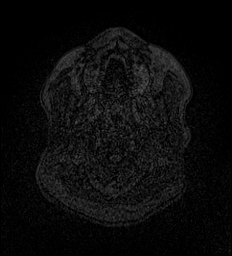
[im 18/175]
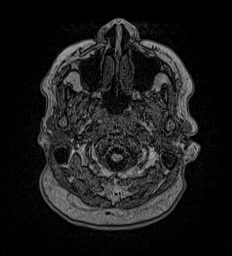
[im 35/175]
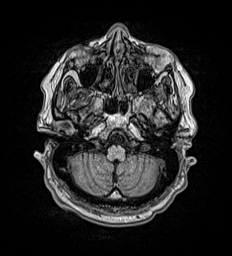
[im 53/175]
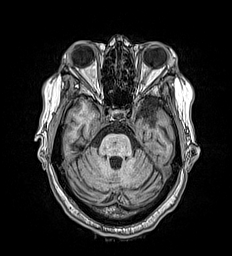
[im 70/175]
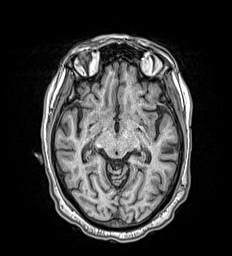
[im 88/175]
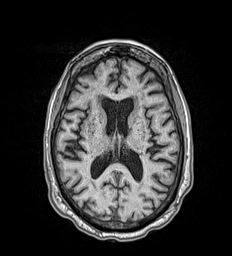
[im 105/175]
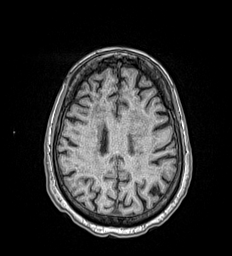
[im 122/175]
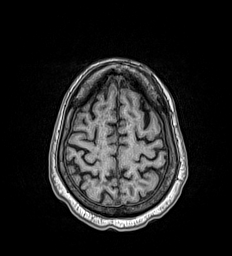
[im 140/175]
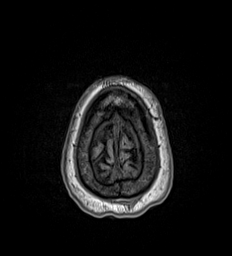
[im 157/175]
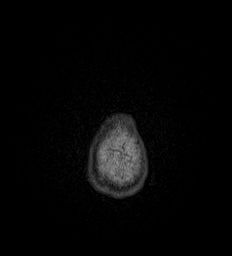
[im 175/175]
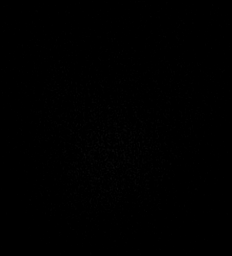

[Series 18: T2 post-contrast · coronal · 5.0mm · 0.57mm/px · 2 of 29 slices shown]
[im 1/29]
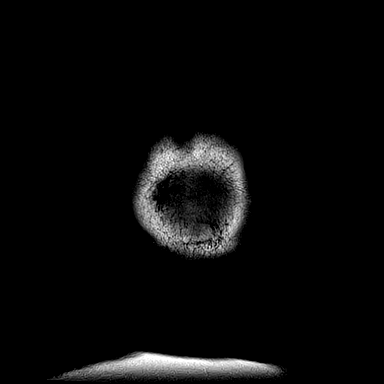
[im 29/29]
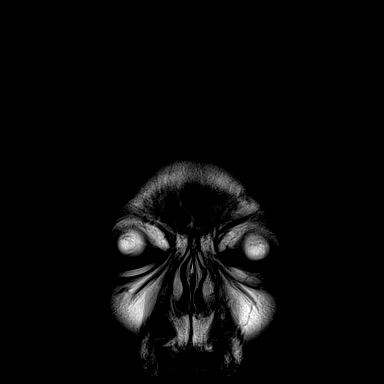

[Series 19: T1 post-contrast · axial · 1.0mm · 0.98mm/px · z∈[-121,+54]mm · 11 of 175 slices shown (1 of 2)]
[im 1/175]
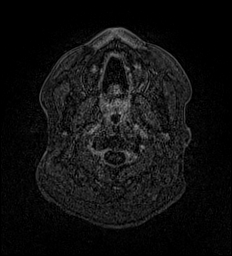
[im 18/175]
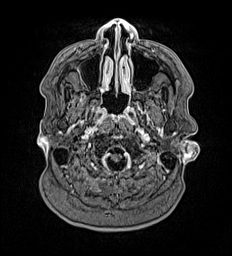
[im 35/175]
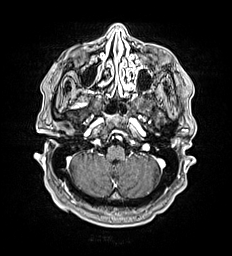
[im 53/175]
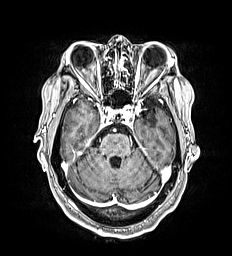
[im 70/175]
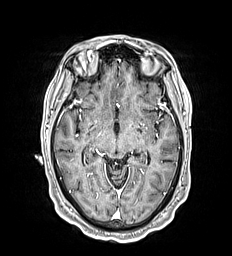
[im 88/175]
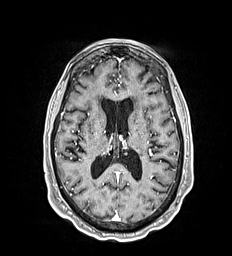
[im 105/175]
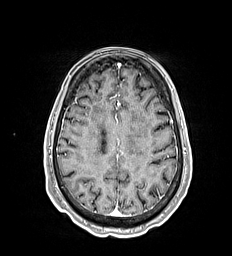
[im 122/175]
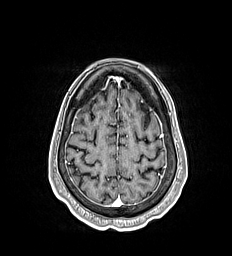
[im 140/175]
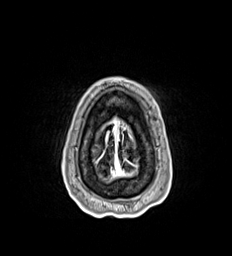
[im 157/175]
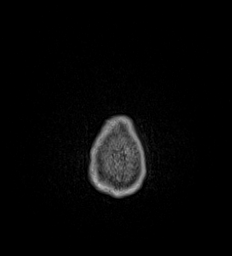
[im 175/175]
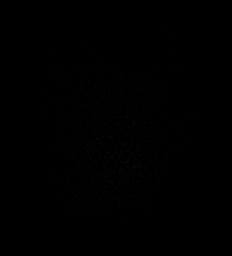

[Series 20: T1 post-contrast · coronal · 5.0mm · 0.57mm/px · 2 of 29 slices shown (2 of 2)]
[im 1/29]
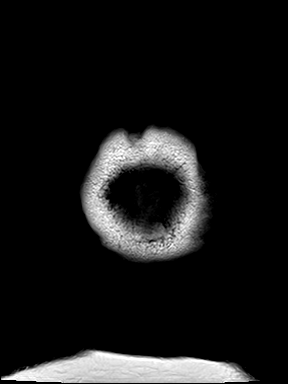
[im 29/29]
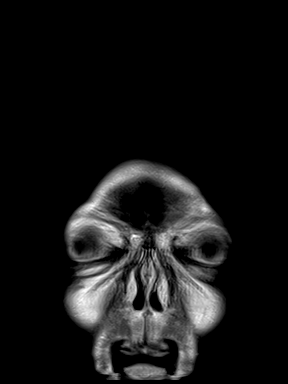

[48 of 48 positions shown; findings below may reference images not displayed]

FINDINGS: Brain: No restricted diffusion to suggest acute infarct. No abnormal
enhancement. No acute hemorrhage, mass, mass effect, or midline
shift. Confluent T2 hyperintense signal in the periventricular white
matter, likely the sequela of severe chronic small vessel ischemic
disease. Lacunar infarcts in the bilateral basal ganglia and right
thalamus, with dilated perivascular spaces, right greater than left.
Remote high right parietal and left temporal infarcts. Punctate foci
of hemosiderin deposition in the right cerebellum, and bilateral
thalami, likely sequela of prior hypertensive microhemorrhages.

Vascular: Normal flow voids.

Skull and upper cervical spine: Normal marrow signal.

Sinuses/Orbits: Mucosal thickening in the right greater than left
maxillary sinus. Status post bilateral lens replacements.

Other: Trace fluid in left mastoid tip.
IMPRESSION: No acute intracranial process.

## 2021-12-25 ENCOUNTER — Ambulatory Visit (INDEPENDENT_AMBULATORY_CARE_PROVIDER_SITE_OTHER): Payer: Medicare Other | Admitting: Nurse Practitioner

## 2021-12-25 ENCOUNTER — Other Ambulatory Visit: Payer: Self-pay

## 2021-12-25 ENCOUNTER — Encounter: Payer: Self-pay | Admitting: Nurse Practitioner

## 2021-12-25 VITALS — BP 130/74 | HR 72 | Temp 98.1°F | Resp 16 | Ht 63.0 in | Wt 206.2 lb

## 2021-12-25 DIAGNOSIS — R5383 Other fatigue: Secondary | ICD-10-CM | POA: Diagnosis not present

## 2021-12-25 DIAGNOSIS — E1165 Type 2 diabetes mellitus with hyperglycemia: Secondary | ICD-10-CM | POA: Diagnosis not present

## 2021-12-25 DIAGNOSIS — E034 Atrophy of thyroid (acquired): Secondary | ICD-10-CM | POA: Diagnosis not present

## 2021-12-25 DIAGNOSIS — Z794 Long term (current) use of insulin: Secondary | ICD-10-CM

## 2021-12-25 DIAGNOSIS — K5900 Constipation, unspecified: Secondary | ICD-10-CM | POA: Diagnosis not present

## 2021-12-25 DIAGNOSIS — M1A00X0 Idiopathic chronic gout, unspecified site, without tophus (tophi): Secondary | ICD-10-CM | POA: Diagnosis not present

## 2021-12-25 LAB — POCT GLYCOSYLATED HEMOGLOBIN (HGB A1C): Hemoglobin A1C: 10.8 % — AB (ref 4.0–5.6)

## 2021-12-25 MED ORDER — LINACLOTIDE 145 MCG PO CAPS: 145.0000 ug | ORAL_CAPSULE | Freq: Every day | ORAL | 0 refills | Status: DC

## 2021-12-25 MED ORDER — DULAGLUTIDE 3 MG/0.5ML ~~LOC~~ SOAJ: 3.0000 mg | SUBCUTANEOUS | 3 refills | Status: DC

## 2021-12-25 NOTE — Progress Notes (Signed)
[facility] [place], [place] [address]  Internal MEDICINE  Office Visit Note  Patient Name: Chelsey [name]  [id number]  [id number]  Date of Service: 12/25/2021  Chief Complaint  Patient presents with   Follow-up   Diabetes   Hypertension   Gastroesophageal Reflux   Sleeping Problem    Pt feels that she sleeps so hard she can not hear or be aware of her surroundings    HPI Chelsey [name] presents for a follow up visit for diabetes, hypertension and sleeping too much. She reports that she sleeps really hard every since she was hospitalized in the fall 2022. She does not have problems with waking up in the middle of the night but she sleeps very deeply and states that nothing seems to wake her up. She will see her endocrinologist next month. Her glucose level is elevated above 200 every morning. Her A1C is improving and was 10.8 today.   Current Medication: Outpatient Encounter Medications as of 12/25/2021  Medication Sig   albuterol (VENTOLIN HFA) 108 (90 Base) MCG/ACT inhaler Inhale 2 puffs into the lungs every 6 (six) hours as needed for wheezing or shortness of breath.   amLODipine (NORVASC) 10 MG tablet Take 1 tablet (10 mg total) by mouth daily.   aspirin EC 81 MG tablet Take 1 tablet (81 mg total) by mouth daily.   atorvastatin (LIPITOR) 10 MG tablet TAKE 1 TABLET BY MOUTH DAILY FOR CHOLESTEROL   Blood Glucose Monitoring Suppl (ACCU-CHEK AVIVA PLUS) W/DEVICE KIT Use as directed.   cilostazol (PLETAL) 50 MG tablet Take 1 tablet (50 mg total) by mouth 2 (two) times daily.   dapagliflozin propanediol (FARXIGA) 10 MG TABS tablet Take 1 tablet (10 mg total) by mouth daily.   ezetimibe (ZETIA) 10 MG tablet Take 1 tablet (10 mg total) by mouth daily.   febuxostat (ULORIC) 40 MG tablet Take 1 tablet (40 mg total) by mouth daily.   gabapentin (NEURONTIN) 300 MG capsule Take 1 capsule (300 mg total) by mouth 3 (three) times daily.   glucose blood test strip Test sugar 4 x  aday for uncontrolled dm on insulin E11.22   hydroxychloroquine (PLAQUENIL) 200 MG tablet Take 1 tablet (200 mg total) by mouth 2 (two) times daily.   Insulin Pen Needle (ULTICARE SHORT PEN NEEDLES) 31G X 8 MM MISC USE 3 TIMES A DAY   LANCETS ULTRA THIN MISC Apply 1 each topically daily.    levothyroxine (SYNTHROID) 50 MCG tablet Take 1 tablet in the morning on a empty stomach   losartan (COZAAR) 50 MG tablet TAKE 1 TABLET BY MOUTH DAILY FOR HYPENTENSION   omeprazole (PRILOSEC) 20 MG capsule TAKE ONE CAPSULE BY MOUTH TWICE A DAY FOR GERD   OXYGEN Inhale 3 L into the lungs. Nighttime use   [DISCONTINUED] Dulaglutide (TRULICITY) 1.5 TJ/0.3ES SOPN Inject 1.33m subcutaneous once a week for one month. After 1 month increase to 359m   [DISCONTINUED] Dulaglutide 3 MG/0.5ML SOPN Inject 3 mg into the skin once a week.   [DISCONTINUED] fluticasone-salmeterol (ADVAIR) 250-50 MCG/ACT AEPB Inhale 1 puff into the lungs in the morning and at bedtime.   [DISCONTINUED] insulin isophane & regular human KwikPen (HUMULIN 70/30 MIX) (70-30) 100 UNIT/ML KwikPen Inject 25 Units into the skin 2 (two) times daily.   [DISCONTINUED] linaclotide (LINZESS) 145 MCG CAPS capsule Take 1 capsule (145 mcg total) by mouth daily.   [DISCONTINUED] traZODone (DESYREL) 50 MG tablet TAKE ONE-HALF TABLET BY MOUTH AT BEDTIME AS NEEDED  FOR SLEEP   linaclotide (LINZESS) 145 MCG CAPS capsule Take 1 capsule (145 mcg total) by mouth daily.   No facility-administered encounter medications on file as of 12/25/2021.    Surgical History: Past Surgical History:  Procedure Laterality Date   BRAIN SURGERY  1990's   CATARACT EXTRACTION W/PHACO Left 06/11/2016   Procedure: CATARACT EXTRACTION PHACO AND INTRAOCULAR LENS PLACEMENT (IOC);  Surgeon: Birder Robson, MD;  Location: [facility] ORS;  Service: Ophthalmology;  Laterality: Left;  [place] 1.01 AP% 21.6 CDE 13.23 Fluid pack lot # [id number]   CEREBRAL ANEURYSM REPAIR  1990's   COILS   CORONARY ARTERY  BYPASS GRAFT     EYE SURGERY  2000's   bilateral cataract   FRACTURE SURGERY     HERNIA REPAIR  2000   ventral   STENT PLACEMENT VASCULAR ([place] [facility])     VEIN BYPASS SURGERY     VENTRAL HERNIA REPAIR N/A 04/29/2016   Procedure: Laparoscopic HERNIA REPAIR VENTRAL ADULT;  Surgeon: Jules Husbands, MD;  Location: [facility] ORS;  Service: General;  Laterality: N/A;    Medical History: Past Medical History:  Diagnosis Date   Anemia    Aneurysm ([place])    Arthritis    RHEUMATOID ARTHRITIS   Asthma    Collagen vascular disease ([place])    COPD (chronic obstructive pulmonary disease) ([place])    Coronary artery disease    Dementia (HCC)    Diabetes mellitus without complication (HCC)    Edema    FEET/LEGS   GERD (gastroesophageal reflux disease)    Gout    H/O wheezing    History of hiatal hernia    Hypertension    Hypothyroidism    Neuropathy    Oxygen deficiency    HS   Peripheral vascular disease (HCC)    Sarcoidosis of lung (HCC)    Seizures (HCC)    WITH BRAIN 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    Sleep apnea    OXYGEN AT NIGHT 3 L [place]    Family History: Family History  Problem Relation Age of Onset   Heart disease Mother    Hypertension Mother    Diabetes Mother    Diabetes Father    Heart disease Father    Hypertension Father    Breast cancer Maternal Aunt     Social History   Socioeconomic History   Marital status: Single    Spouse name: Not on file   Number of children: Not on file   Years of education: Not on file   Highest education level: Not on file  Occupational History   Not on file  Tobacco Use   Smoking status: Former    Packs/day: 1.00    Years: 30.00    Pack years: 30.00    Types: Cigarettes    Quit date: 01/27/2006    Years since quitting: 15.9   Smokeless tobacco: Never   Tobacco comments:    quit   Substance and Sexual Activity   Alcohol use: No    Alcohol/week: 0.0 standard drinks   Drug use: No   Sexual activity: Not on file  Other Topics Concern    Not on file  Social History Narrative   Not on file   Social Determinants of Health   Financial Resource Strain: Not on file  Food Insecurity: Not on file  Transportation Needs: Not on file  Physical Activity: Not on file  Stress: Not on file  Social Connections: Not on file  Intimate Partner Violence: Not on  file      Review of Systems  Constitutional:  Positive for fatigue. Negative for chills and unexpected weight change.  HENT:  Negative for congestion, rhinorrhea, sneezing and sore throat.   Eyes:  Negative for redness.  Respiratory:  Negative for cough, chest tightness, shortness of breath and wheezing.   Cardiovascular: Negative.  Negative for chest pain and palpitations.  Gastrointestinal:  Negative for abdominal pain, constipation, diarrhea, nausea and vomiting.  Genitourinary:  Negative for dysuria and frequency.  Musculoskeletal:  Negative for arthralgias, back pain, joint swelling and neck pain.  Skin:  Negative for rash.  Neurological: Negative.  Negative for tremors and numbness.  Hematological:  Negative for adenopathy. Does not bruise/bleed easily.  Psychiatric/Behavioral:  Negative for behavioral problems (Depression), sleep disturbance and suicidal ideas. The patient is not nervous/anxious.    Vital Signs: BP 130/74    Pulse 72    Temp 98.1 F (36.7 C)    Resp 16    Ht 5' 3"  (1.6 m)    Wt 206 lb 3.2 oz (93.5 kg)    SpO2 97%    BMI 36.53 kg/m    Physical Exam Vitals reviewed.  Constitutional:      General: She is not in acute distress.    Appearance: Normal appearance. She is obese. She is not ill-appearing.  HENT:     Head: Normocephalic and atraumatic.  Eyes:     Pupils: Pupils are equal, round, and reactive to light.  Cardiovascular:     Rate and Rhythm: Normal rate and regular rhythm.  Pulmonary:     Effort: Pulmonary effort is normal. No respiratory distress.  Neurological:     Mental Status: She is alert and oriented to person, place, and  time.  Psychiatric:        Mood and Affect: Mood normal.        Behavior: Behavior normal.       Assessment/Plan: 1. Type 2 diabetes mellitus with hyperglycemia, with long-term current use of insulin (HCC) A1C is 10.8. her endocrinologist wanted her trulicity dose to increase to 3 mg after 1 month of 1.5 dose. Labs ordered.  - POCT glycosylated hemoglobin (Hb A1C) - CMP14+EGFR  2. Other fatigue Fatigue and low energy since she was hospitalized in fall 2022. Feels like she sleeps too hard.   3. Hypothyroidism due to acquired atrophy of thyroid Repeat thyroid labs.  - TSH + free T4  4. Constipation, unspecified constipation type Linzess ordered.  - linaclotide (LINZESS) 145 MCG CAPS capsule; Take 1 capsule (145 mcg total) by mouth daily.  Dispense: 30 capsule; Refill: 0  5. Chronic gouty arthropathy without tophi Repeat uric acid level, patient is taking febuxostat.  - Uric acid   General Counseling: Sirena verbalizes understanding of the findings of todays visit and agrees with plan of treatment. I have discussed any further diagnostic evaluation that may be needed or ordered today. We also reviewed her medications today. she has been encouraged to call the office with any questions or concerns that should arise related to todays visit.    Orders Placed This Encounter  Procedures   TSH + free T4   Uric acid   CMP14+EGFR   POCT glycosylated hemoglobin (Hb A1C)    Meds ordered this encounter  Medications   DISCONTD: Dulaglutide 3 MG/0.5ML SOPN    Sig: Inject 3 mg into the skin once a week.    Dispense:  2 mL    Refill:  3    Patient  sees endocrinology, was started on 1.5 mg dose, endo wants her increased after 1 month, please discontinue 1.5 dose, and start this dose with next fill   linaclotide (LINZESS) 145 MCG CAPS capsule    Sig: Take 1 capsule (145 mcg total) by mouth daily.    Dispense:  30 capsule    Refill:  0    Return in about 2 weeks (around 01/08/2022)  for F/U, Tanaisha Pittman PCP.   Total time spent:30 Minutes Time spent includes review of chart, medications, test results, and follow up plan with the patient.   [place] Controlled Substance Database was reviewed by me.  This patient was seen by [name], FNP-C in collaboration with Dr. Clayborn Bigness as a part of collaborative care agreement.   Sanders Manninen R. Valetta Fuller, MSN, FNP-C Internal medicine

## 2021-12-28 ENCOUNTER — Encounter: Payer: Self-pay | Admitting: Nurse Practitioner

## 2021-12-29 ENCOUNTER — Other Ambulatory Visit: Payer: Self-pay

## 2021-12-29 MED ORDER — FLUTICASONE-SALMETEROL 250-50 MCG/ACT IN AEPB: 1.0000 | INHALATION_SPRAY | Freq: Two times a day (BID) | RESPIRATORY_TRACT | 1 refills | Status: DC

## 2021-12-30 ENCOUNTER — Telehealth: Payer: Self-pay | Admitting: Student-PharmD

## 2021-12-30 ENCOUNTER — Other Ambulatory Visit: Payer: Self-pay

## 2021-12-30 DIAGNOSIS — E118 Type 2 diabetes mellitus with unspecified complications: Secondary | ICD-10-CM | POA: Diagnosis not present

## 2021-12-30 DIAGNOSIS — Z794 Long term (current) use of insulin: Secondary | ICD-10-CM | POA: Diagnosis not present

## 2021-12-30 MED ORDER — DULAGLUTIDE 3 MG/0.5ML ~~LOC~~ SOAJ: 3.0000 mg | SUBCUTANEOUS | 3 refills | Status: DC

## 2021-12-30 NOTE — Progress Notes (Addendum)
Chronic Care Management Pharmacy Assistant   Name: Chelsey [name]  MRN: [id number] DOB: [birth year]  Reason for Encounter: Medication Review/Medication Coordination Call   Medications: Outpatient Encounter Medications as of 12/30/2021  Medication Sig   albuterol (VENTOLIN HFA) 108 (90 Base) MCG/ACT inhaler Inhale 2 puffs into the lungs every 6 (six) hours as needed for wheezing or shortness of breath.   amLODipine (NORVASC) 10 MG tablet Take 1 tablet (10 mg total) by mouth daily.   aspirin EC 81 MG tablet Take 1 tablet (81 mg total) by mouth daily.   atorvastatin (LIPITOR) 10 MG tablet TAKE 1 TABLET BY MOUTH DAILY FOR CHOLESTEROL   Blood Glucose Monitoring Suppl (ACCU-CHEK AVIVA PLUS) W/DEVICE KIT Use as directed.   cilostazol (PLETAL) 50 MG tablet Take 1 tablet (50 mg total) by mouth 2 (two) times daily.   dapagliflozin propanediol (FARXIGA) 10 MG TABS tablet Take 1 tablet (10 mg total) by mouth daily.   Dulaglutide 3 MG/0.5ML SOPN Inject 3 mg into the skin once a week.   ezetimibe (ZETIA) 10 MG tablet Take 1 tablet (10 mg total) by mouth daily.   febuxostat (ULORIC) 40 MG tablet Take 1 tablet (40 mg total) by mouth daily.   fluticasone-salmeterol (ADVAIR) 250-50 MCG/ACT AEPB Inhale 1 puff into the lungs in the morning and at bedtime.   gabapentin (NEURONTIN) 300 MG capsule Take 1 capsule (300 mg total) by mouth 3 (three) times daily.   glucose blood test strip Test sugar 4 x aday for uncontrolled dm on insulin E11.22   hydroxychloroquine (PLAQUENIL) 200 MG tablet Take 1 tablet (200 mg total) by mouth 2 (two) times daily.   insulin isophane & regular human KwikPen (HUMULIN 70/30 MIX) (70-30) 100 UNIT/ML KwikPen Inject 25 Units into the skin 2 (two) times daily.   Insulin Pen Needle (ULTICARE SHORT PEN NEEDLES) 31G X 8 MM MISC USE 3 TIMES A DAY   LANCETS ULTRA THIN MISC Apply 1 each topically daily.    levothyroxine (SYNTHROID) 50 MCG tablet Take 1 tablet in the morning on a empty stomach    linaclotide (LINZESS) 145 MCG CAPS capsule Take 1 capsule (145 mcg total) by mouth daily.   losartan (COZAAR) 50 MG tablet TAKE 1 TABLET BY MOUTH DAILY FOR HYPENTENSION   omeprazole (PRILOSEC) 20 MG capsule TAKE ONE CAPSULE BY MOUTH TWICE A DAY FOR GERD   OXYGEN Inhale 3 L into the lungs. Nighttime use   No facility-administered encounter medications on file as of 12/30/2021.   Reviewed chart for medication changes ahead of medication coordination call.  No Consults, or hospital visits since last care coordination call.  Office Visit: 12/25/21 Chelsey Osgood, NP. For type 2 diabetes mellitus. STOPPED Trazodone and Dulaglutide 1.5 mg STARTED Dulaglutide 3 mg subcutaneous weekly.    BP Readings from Last 3 Encounters:  12/25/21 130/74  11/25/21 120/68  10/02/21 117/60    Lab Results  Component Value Date   HGBA1C 10.8 (A) 12/25/2021     Patient obtains medications through Vials  30 Days   Last adherence delivery included:  Amlodipine 10 mg 1 tablet once daily Atorvastatin 10 mg 1 tablet once daily Cilostazol 50 mg 1 tablet twice daily Ezetimibe 10 mg 1 tablet once daily Farxiga 10 mg 1 tablet once daily Advair 250-50 inhale 1 puff in the morning and at bedtime Gabapentin 300 mg 1 capsule three times daily Hydroxychloroquine 200 mg 1 tablet twice daily Levothyroxine 50 MCG 1 tablet every morning on an empty stomach  Losartan 50 mg 1 tablet once daily  Omeprazole 20 mg 1 capsule twice daily Febuxostat 40 mg 1 tablet once daily    Patient declined meds last month: None.  Patient is due for next adherence delivery on: 01/09/22.  Called patient and reviewed medications and coordinated delivery.  This delivery to include: Amlodipine 10 mg 1 tablet once daily Atorvastatin 10 mg 1 tablet once daily Cilostazol 50 mg 1 tablet twice daily Ezetimibe 10 mg 1 tablet once daily Farxiga 10 mg 1 tablet once daily Advair 250-50 inhale 1 puff in the morning and at bedtime Gabapentin  300 mg 1 capsule three times daily Hydroxychloroquine 200 mg 1 tablet twice daily Levothyroxine 50 MCG 1 tablet every morning on an empty stomach Losartan 50 mg 1 tablet once daily  Omeprazole 20 mg 1 capsule twice daily Febuxostat 40 mg 1 tablet once daily  Dulaglutide 3 mg subcutaneous weekly(Due March 6th 2023)-Requested to be sent to [facility].   Patient declined the following medications: None.  Patient does not need refills at this time.   Confirmed delivery date of 12/09/21, advised patient that pharmacy will contact them the morning of delivery.  Charlann Lange, [place]  [phone]   Reviewed by: Alena Bills, PharmD Clinical Pharmacist

## 2022-01-01 DIAGNOSIS — E1165 Type 2 diabetes mellitus with hyperglycemia: Secondary | ICD-10-CM | POA: Diagnosis not present

## 2022-01-01 DIAGNOSIS — E034 Atrophy of thyroid (acquired): Secondary | ICD-10-CM | POA: Diagnosis not present

## 2022-01-01 DIAGNOSIS — Z794 Long term (current) use of insulin: Secondary | ICD-10-CM | POA: Diagnosis not present

## 2022-01-01 DIAGNOSIS — M1A00X0 Idiopathic chronic gout, unspecified site, without tophus (tophi): Secondary | ICD-10-CM | POA: Diagnosis not present

## 2022-01-02 LAB — CMP14+EGFR
ALT: 15 IU/L (ref 0–32)
AST: 24 IU/L (ref 0–40)
Albumin/Globulin Ratio: 1.6 (ref 1.2–2.2)
Albumin: 4.4 g/dL (ref 3.7–4.7)
Alkaline Phosphatase: 157 IU/L — ABNORMAL HIGH (ref 44–121)
BUN/Creatinine Ratio: 13 (ref 12–28)
BUN: 17 mg/dL (ref 8–27)
Bilirubin Total: 0.5 mg/dL (ref 0.0–1.2)
CO2: 20 mmol/L (ref 20–29)
Calcium: 9.2 mg/dL (ref 8.7–10.3)
Chloride: 108 mmol/L — ABNORMAL HIGH (ref 96–106)
Creatinine, Ser: 1.27 mg/dL — ABNORMAL HIGH (ref 0.57–1.00)
Globulin, Total: 2.8 g/dL (ref 1.5–4.5)
Glucose: 192 mg/dL — ABNORMAL HIGH (ref 70–99)
Potassium: 4.2 mmol/L (ref 3.5–5.2)
Sodium: 142 mmol/L (ref 134–144)
Total Protein: 7.2 g/dL (ref 6.0–8.5)
eGFR: 45 mL/min/{1.73_m2} — ABNORMAL LOW (ref >59)

## 2022-01-02 LAB — TSH+FREE T4
Free T4: 1.03 ng/dL (ref 0.82–1.77)
TSH: 5.36 u[IU]/mL — ABNORMAL HIGH (ref 0.450–4.500)

## 2022-01-02 LAB — URIC ACID: Uric Acid: 2.9 mg/dL — ABNORMAL LOW (ref 3.1–7.9)

## 2022-01-04 NOTE — Progress Notes (Signed)
I have reviewed the lab results. There are no critically abnormal values requiring immediate intervention but there are some abnormals that will be discussed at the next office visit.  

## 2022-01-09 ENCOUNTER — Encounter: Payer: Self-pay | Admitting: Nurse Practitioner

## 2022-01-09 ENCOUNTER — Other Ambulatory Visit: Payer: Self-pay

## 2022-01-09 ENCOUNTER — Ambulatory Visit (INDEPENDENT_AMBULATORY_CARE_PROVIDER_SITE_OTHER): Payer: Medicare Other | Admitting: Nurse Practitioner

## 2022-01-09 VITALS — BP 140/70 | HR 70 | Temp 98.1°F | Resp 16 | Ht 65.0 in | Wt 208.4 lb

## 2022-01-09 DIAGNOSIS — M1A00X0 Idiopathic chronic gout, unspecified site, without tophus (tophi): Secondary | ICD-10-CM | POA: Diagnosis not present

## 2022-01-09 DIAGNOSIS — N1831 Chronic kidney disease, stage 3a: Secondary | ICD-10-CM | POA: Diagnosis not present

## 2022-01-09 DIAGNOSIS — E1165 Type 2 diabetes mellitus with hyperglycemia: Secondary | ICD-10-CM | POA: Diagnosis not present

## 2022-01-09 DIAGNOSIS — I10 Essential (primary) hypertension: Secondary | ICD-10-CM | POA: Diagnosis not present

## 2022-01-09 DIAGNOSIS — Z794 Long term (current) use of insulin: Secondary | ICD-10-CM | POA: Diagnosis not present

## 2022-01-09 MED ORDER — CELECOXIB 100 MG PO CAPS: 100.0000 mg | ORAL_CAPSULE | Freq: Every day | ORAL | 1 refills | Status: DC

## 2022-01-09 NOTE — Progress Notes (Signed)
[facility] [place], [place] [address]  Internal MEDICINE  Office Visit Note  Patient Name: Chelsey [name]  [id number]  [id number]  Date of Service: 01/09/2022  Chief Complaint  Patient presents with   Follow-up    Arthritis    Diabetes   Gastroesophageal Reflux   Hypertension   Anemia   COPD   Asthma    HPI Chelsey [name] presents for a follow-up visit for diabetes, hypertension, arthritis and COPD.  At her previous office visit her Trulicity dose was increased to 3 mg weekly.  Her A1c was 10.8 at the previous visit.  She has not yet received the increased dose of Trulicity.  She is [facility] to [facility]  Her blood pressure is stable with current medications.  Her arthritis has been bothering her in her knees especially and has been making it hard to walk.  She does have decreased kidney function but can take up to 100 mg of Celebrex daily and has not been taking it lately.    Current Medication: Outpatient Encounter Medications as of 01/09/2022  Medication Sig   albuterol (VENTOLIN HFA) 108 (90 Base) MCG/ACT inhaler Inhale 2 puffs into the lungs every 6 (six) hours as needed for wheezing or shortness of breath.   amLODipine (NORVASC) 10 MG tablet Take 1 tablet (10 mg total) by mouth daily.   aspirin EC 81 MG tablet Take 1 tablet (81 mg total) by mouth daily.   atorvastatin (LIPITOR) 10 MG tablet TAKE 1 TABLET BY MOUTH DAILY FOR CHOLESTEROL   Blood Glucose Monitoring Suppl (ACCU-CHEK AVIVA PLUS) W/DEVICE KIT Use as directed.   celecoxib (CELEBREX) 100 MG capsule Take 1 capsule (100 mg total) by mouth daily.   cilostazol (PLETAL) 50 MG tablet Take 1 tablet (50 mg total) by mouth 2 (two) times daily.   dapagliflozin propanediol (FARXIGA) 10 MG TABS tablet Take 1 tablet (10 mg total) by mouth daily.   Dulaglutide 3 MG/0.5ML SOPN Inject 3 mg into the skin once a week.   ezetimibe (ZETIA) 10 MG tablet Take  1 tablet (10 mg total) by mouth daily.   febuxostat (ULORIC) 40 MG tablet Take 1 tablet (40 mg total) by mouth daily.   fluticasone-salmeterol (ADVAIR) 250-50 MCG/ACT AEPB Inhale 1 puff into the lungs in the morning and at bedtime.   gabapentin (NEURONTIN) 300 MG capsule Take 1 capsule (300 mg total) by mouth 3 (three) times daily.   glucose blood test strip Test sugar 4 x aday for uncontrolled dm on insulin E11.22   hydroxychloroquine (PLAQUENIL) 200 MG tablet Take 1 tablet (200 mg total) by mouth 2 (two) times daily.   insulin isophane & regular human KwikPen (HUMULIN 70/30 MIX) (70-30) 100 UNIT/ML KwikPen Inject 25 Units into the skin 2 (two) times daily.   Insulin Pen Needle (ULTICARE SHORT PEN NEEDLES) 31G X 8 MM MISC USE 3 TIMES A DAY   LANCETS ULTRA THIN MISC Apply 1 each topically daily.    levothyroxine (SYNTHROID) 50 MCG tablet Take 1 tablet in the morning on a empty stomach   linaclotide (LINZESS) 145 MCG CAPS capsule Take 1 capsule (145 mcg total) by mouth daily.   losartan (COZAAR) 50 MG tablet TAKE 1 TABLET BY MOUTH DAILY FOR HYPENTENSION   omeprazole (PRILOSEC) 20 MG capsule TAKE ONE CAPSULE BY MOUTH TWICE A DAY FOR GERD   OXYGEN Inhale 3 L into the lungs. Nighttime use   Tdap ([place]) 5-2.5-18.5  LF-MCG/0.5 injection Inject 0.5 mLs into the muscle once.   Zoster Vaccine Adjuvanted [facility]) injection Inject 0.5 mLs into the muscle once.   No facility-administered encounter medications on file as of 01/09/2022.    Surgical History: Past Surgical History:  Procedure Laterality Date   BRAIN SURGERY  1990's   CATARACT EXTRACTION W/PHACO Left 06/11/2016   Procedure: CATARACT EXTRACTION PHACO AND INTRAOCULAR LENS PLACEMENT (IOC);  Surgeon: Birder Robson, MD;  Location: [facility] ORS;  Service: Ophthalmology;  Laterality: Left;  [place] 1.01 AP% 21.6 CDE 13.23 Fluid pack lot # [id number]   CEREBRAL ANEURYSM REPAIR  1990's   COILS   CORONARY ARTERY BYPASS GRAFT     EYE SURGERY  2000's    bilateral cataract   FRACTURE SURGERY     HERNIA REPAIR  2000   ventral   STENT PLACEMENT VASCULAR ([place] [facility])     VEIN BYPASS SURGERY     VENTRAL HERNIA REPAIR N/A 04/29/2016   Procedure: Laparoscopic HERNIA REPAIR VENTRAL ADULT;  Surgeon: Jules Husbands, MD;  Location: [facility] ORS;  Service: General;  Laterality: N/A;    Medical History: Past Medical History:  Diagnosis Date   Anemia    Aneurysm ([place])    Arthritis    RHEUMATOID ARTHRITIS   Asthma    Collagen vascular disease ([place])    COPD (chronic obstructive pulmonary disease) ([place])    Coronary artery disease    Dementia (HCC)    Diabetes mellitus without complication (HCC)    Edema    FEET/LEGS   GERD (gastroesophageal reflux disease)    Gout    H/O wheezing    History of hiatal hernia    Hypertension    Hypothyroidism    Neuropathy    Oxygen deficiency    HS   Peripheral vascular disease (HCC)    Sarcoidosis of lung (HCC)    Seizures (HCC)    WITH BRAIN 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    Sleep apnea    OXYGEN AT NIGHT 3 L [place]    Family History: Family History  Problem Relation Age of Onset   Heart disease Mother    Hypertension Mother    Diabetes Mother    Diabetes Father    Heart disease Father    Hypertension Father    Breast cancer Maternal Aunt     Social History   Socioeconomic History   Marital status: Single    Spouse name: Not on file   Number of children: Not on file   Years of education: Not on file   Highest education level: Not on file  Occupational History   Not on file  Tobacco Use   Smoking status: Former    Packs/day: 1.00    Years: 30.00    Pack years: 30.00    Types: Cigarettes    Quit date: 01/27/2006    Years since quitting: 15.9   Smokeless tobacco: Never   Tobacco comments:    quit   Substance and Sexual Activity   Alcohol use: No    Alcohol/week: 0.0 standard drinks   Drug use: No   Sexual activity: Not on file  Other Topics Concern   Not on file  Social History  Narrative   Not on file   Social Determinants of Health   Financial Resource Strain: Not on file  Food Insecurity: Not on file  Transportation Needs: Not on file  Physical Activity: Not on file  Stress: Not on file  Social Connections: Not on file  Intimate  Partner Violence: Not on file      Review of Systems  Constitutional:  Negative for chills, fatigue and unexpected weight change.  HENT:  Negative for congestion, rhinorrhea, sneezing and sore throat.   Eyes:  Negative for redness.  Respiratory:  Negative for cough, chest tightness and shortness of breath.   Cardiovascular:  Negative for chest pain and palpitations.  Gastrointestinal:  Negative for abdominal pain, constipation, diarrhea, nausea and vomiting.  Genitourinary:  Negative for dysuria and frequency.  Musculoskeletal:  Positive for arthralgias. Negative for back pain, joint swelling and neck pain.  Skin:  Negative for rash.  Neurological: Negative.  Negative for tremors and numbness.  Hematological:  Negative for adenopathy. Does not bruise/bleed easily.  Psychiatric/Behavioral:  Negative for behavioral problems (Depression), sleep disturbance and suicidal ideas. The patient is not nervous/anxious.    Vital Signs: BP 140/70    Pulse 70    Temp 98.1 F (36.7 C)    Resp 16    Ht _0  (1.651 m)    Wt 208 lb 6.4 oz (94.5 kg)    SpO2 98%    BMI 34.68 kg/m    Physical Exam Vitals reviewed.  Constitutional:      General: She is not in acute distress.    Appearance: Normal appearance. She is obese. She is not ill-appearing.  HENT:     Head: Normocephalic and atraumatic.  Eyes:     Pupils: Pupils are equal, round, and reactive to light.  Cardiovascular:     Rate and Rhythm: Normal rate and regular rhythm.  Pulmonary:     Effort: Pulmonary effort is normal. No respiratory distress.  Neurological:     Mental Status: She is alert and oriented to person, place, and time.     Cranial Nerves: No cranial nerve  deficit.     Coordination: Coordination normal.     Gait: Gait normal.  Psychiatric:        Mood and Affect: Mood normal.        Behavior: Behavior normal.       Assessment/Plan: 1. Type 2 diabetes mellitus with hyperglycemia, with long-term current use of insulin [facility]) Patient will see if she receives the Trulicity 3 mg dose [facility].  Patient acknowledges that she will call the pharmacy if she does not receive the increased dose of Trulicity.  We will follow-up in 3 months to repeat A1c.   2. Chronic gouty arthropathy without tophi Patient instructed to take 100 mg capsule of Celebrex once daily for arthritic pain.  Patient is also on a preventative for gout, febuxostat. - celecoxib (CELEBREX) 100 MG capsule; Take 1 capsule (100 mg total) by mouth daily.  Dispense: 90 capsule; Refill: 1  3. Essential hypertension Blood pressure is stable on current medications, no intervention is necessary.  4. Chronic kidney disease, stage 3a ([place]) With her decreased kidney function, it is important to review her medications to ensure that she is not on any medications that would further impair her kidneys and to ensure that renal dosing is correct.  Patient was restarted on Celebrex but can take no more than 100 mg/day.  For her gout preventative patient was taken off of allopurinol on a previous office visit and started on febuxostat.     General Counseling: Courtnee verbalizes understanding of the findings of todays visit and agrees with plan of treatment. I have discussed any further diagnostic evaluation that may be needed or ordered today. We also  reviewed her medications today. she has been encouraged to call the office with any questions or concerns that should arise related to todays visit.    No orders of the defined types were placed in this encounter.   Meds ordered this encounter  Medications   celecoxib (CELEBREX) 100 MG capsule    Sig:  Take 1 capsule (100 mg total) by mouth daily.    Dispense:  90 capsule    Refill:  1    Return in about 3 months (around 04/08/2022) for F/U, Recheck A1C, Jesua Tamblyn PCP.   Total time spent:30 Minutes Time spent includes review of chart, medications, test results, and follow up plan with the patient.   [place] Controlled Substance Database was reviewed by me.  This patient was seen by [name], FNP-C in collaboration with Dr. Clayborn Bigness as a part of collaborative care agreement.   Tamilyn Lupien R. Valetta Fuller, MSN, FNP-C Internal medicine

## 2022-01-12 ENCOUNTER — Telehealth: Payer: Self-pay

## 2022-01-13 DIAGNOSIS — I10 Essential (primary) hypertension: Secondary | ICD-10-CM | POA: Diagnosis not present

## 2022-01-13 DIAGNOSIS — E1165 Type 2 diabetes mellitus with hyperglycemia: Secondary | ICD-10-CM | POA: Diagnosis not present

## 2022-01-13 DIAGNOSIS — N1831 Chronic kidney disease, stage 3a: Secondary | ICD-10-CM | POA: Diagnosis not present

## 2022-01-16 ENCOUNTER — Other Ambulatory Visit: Payer: Self-pay

## 2022-01-16 MED ORDER — INSULIN ISOPHANE & REGULAR (HUMAN 70-30)100 UNIT/ML KWIKPEN: 25.0000 [IU] | PEN_INJECTOR | Freq: Two times a day (BID) | SUBCUTANEOUS | 1 refills | Status: DC

## 2022-01-16 NOTE — Telephone Encounter (Signed)
Spoke to pt and advised we have her letter ready and it is up front to pick up ?

## 2022-01-18 ENCOUNTER — Encounter: Payer: Self-pay | Admitting: Nurse Practitioner

## 2022-01-19 ENCOUNTER — Other Ambulatory Visit: Payer: Self-pay

## 2022-01-19 DIAGNOSIS — N186 End stage renal disease: Secondary | ICD-10-CM

## 2022-01-19 DIAGNOSIS — E1122 Type 2 diabetes mellitus with diabetic chronic kidney disease: Secondary | ICD-10-CM

## 2022-01-19 MED ORDER — GLUCOSE BLOOD VI STRP: ORAL_STRIP | 12 refills | Status: AC

## 2022-01-22 DIAGNOSIS — E118 Type 2 diabetes mellitus with unspecified complications: Secondary | ICD-10-CM | POA: Diagnosis not present

## 2022-01-22 DIAGNOSIS — Z794 Long term (current) use of insulin: Secondary | ICD-10-CM | POA: Diagnosis not present

## 2022-01-26 ENCOUNTER — Ambulatory Visit: Payer: Medicare Other | Admitting: Podiatry

## 2022-01-28 ENCOUNTER — Telehealth: Payer: Self-pay | Admitting: Student-PharmD

## 2022-01-28 NOTE — Progress Notes (Addendum)
?Chronic Care Management ?Pharmacy Assistant  ? ?Name: Chelsey [name]  MRN: [id number] DOB: 1951/05/20 ? ? ?Reason for Encounter: Medication Review/Medication Coordination Call ?  ?Medications: ?Outpatient Encounter Medications as of 01/28/2022  ?Medication Sig  ? albuterol (VENTOLIN HFA) 108 (90 Base) MCG/ACT inhaler Inhale 2 puffs into the lungs every 6 (six) hours as needed for wheezing or shortness of breath.  ? amLODipine (NORVASC) 10 MG tablet Take 1 tablet (10 mg total) by mouth daily.  ? aspirin EC 81 MG tablet Take 1 tablet (81 mg total) by mouth daily.  ? atorvastatin (LIPITOR) 10 MG tablet TAKE 1 TABLET BY MOUTH DAILY FOR CHOLESTEROL  ? celecoxib (CELEBREX) 100 MG capsule Take 1 capsule (100 mg total) by mouth daily.  ? cilostazol (PLETAL) 50 MG tablet Take 1 tablet (50 mg total) by mouth 2 (two) times daily.  ? dapagliflozin propanediol (FARXIGA) 10 MG TABS tablet Take 1 tablet (10 mg total) by mouth daily.  ? Dulaglutide 3 MG/0.5ML SOPN Inject 3 mg into the skin once a week.  ? ezetimibe (ZETIA) 10 MG tablet Take 1 tablet (10 mg total) by mouth daily.  ? febuxostat (ULORIC) 40 MG tablet Take 1 tablet (40 mg total) by mouth daily.  ? fluticasone-salmeterol (ADVAIR) 250-50 MCG/ACT AEPB Inhale 1 puff into the lungs in the morning and at bedtime.  ? gabapentin (NEURONTIN) 300 MG capsule Take 1 capsule (300 mg total) by mouth 3 (three) times daily.  ? glucose blood test strip Test sugar 4 x aday for uncontrolled dm on insulin E11.22  ? hydroxychloroquine (PLAQUENIL) 200 MG tablet Take 1 tablet (200 mg total) by mouth 2 (two) times daily.  ? insulin isophane & regular human KwikPen (HUMULIN 70/30 MIX) (70-30) 100 UNIT/ML KwikPen Inject 25 Units into the skin 2 (two) times daily.  ? Insulin Pen Needle (ULTICARE SHORT PEN NEEDLES) 31G X 8 MM MISC USE 3 TIMES A DAY  ? LANCETS ULTRA THIN MISC Apply 1 each topically daily.   ? levothyroxine (SYNTHROID) 50 MCG tablet Take 1 tablet in the morning on a empty stomach  ?  linaclotide (LINZESS) 145 MCG CAPS capsule Take 1 capsule (145 mcg total) by mouth daily.  ? losartan (COZAAR) 50 MG tablet TAKE 1 TABLET BY MOUTH DAILY FOR HYPENTENSION  ? omeprazole (PRILOSEC) 20 MG capsule TAKE ONE CAPSULE BY MOUTH TWICE A DAY FOR GERD  ? OXYGEN Inhale 3 L into the lungs. Nighttime use  ? Tdap (BOOSTRIX) 5-2.5-18.5 LF-MCG/0.5 injection Inject 0.5 mLs into the muscle once.  ? Zoster Vaccine Adjuvanted [facility]) injection Inject 0.5 mLs into the muscle once.  ? ?No facility-administered encounter medications on file as of 01/28/2022.  ? ?Reviewed chart for medication changes ahead of medication coordination call. ? ?No Consults, or hospital visits since last care coordination call. ? ?Office Visit: ?01/09/22 [name], NP. For Type 2 diabetes mellitus with hyperglycemia, with long-term current use of insulin. STARTED Celecoxib 100 mg daily.  ? ? ?BP Readings from Last 3 Encounters:  ?01/09/22 140/70  ?12/25/21 130/74  ?11/25/21 120/68  ?  ?Lab Results  ?Component Value Date  ? HGBA1C 10.8 (A) 12/25/2021  ?  ? ?Patient obtains medications through Vials  30 Days  ? ?Last adherence delivery included:  ?Amlodipine 10 mg 1 tablet once daily ?Atorvastatin 10 mg 1 tablet once daily ?Cilostazol 50 mg 1 tablet twice daily ?Ezetimibe 10 mg 1 tablet once daily ?Farxiga 10 mg 1 tablet once daily ?Advair 250-50 inhale 1 puff in  the morning and at bedtime ?Gabapentin 300 mg 1 capsule three times daily ?Hydroxychloroquine 200 mg 1 tablet twice daily ?Levothyroxine 50 MCG 1 tablet every morning on an empty stomach ?Losartan 50 mg 1 tablet once daily  ?Omeprazole 20 mg 1 capsule twice daily ?Febuxostat 40 mg 1 tablet once daily  ?Dulaglutide 3 mg subcutaneous weekly(Due March 6th 2023)-[facility]ed [facility].  ? ?Patient declined meds last month: ?None. ? ?Patient is due for next adherence delivery on: 02/10/2022. ? ?Called patient and reviewed medications and coordinated delivery. ? ?This  delivery to include: ?Amlodipine 10 mg 1 tablet once daily ?Atorvastatin 10 mg 1 tablet once daily ?Cilostazol 50 mg 1 tablet twice daily ?Ezetimibe 10 mg 1 tablet once daily ?Farxiga 10 mg 1 tablet once daily ?Advair 250-50 inhale 1 puff in the morning and at bedtime ?Gabapentin 300 mg 1 capsule three times daily ?Hydroxychloroquine 200 mg 1 tablet twice daily ?Levothyroxine 50 MCG 1 tablet every morning on an empty stomach ?Losartan 50 mg 1 tablet once daily  ?Omeprazole 20 mg 1 capsule twice daily ?Febuxostat 40 mg 1 tablet once daily  ?Iron 325 mg 1 tablet once daily ?Pen Needles 31x5/16 use three times daily ?Humulin Inj 70/30 inject 25 units into the skin twice daily ?Onetouch Ultra test strips use to check blood sugar four times daily ?Trulicity 3 mg inject 3 mg into the skin once a week ?Dulaglutide 3 mg subcutaneous weekly ? ?Patient declined the following medications: ?Celecoxib 100 mg daily - Picked up at different pharmacy   ? ?Patient does not need refills at this time.  ? ?Confirmed delivery date of 02/10/22, advised patient that pharmacy will contact them the morning of delivery. ? ?Charlann Lange, RMA ?Healthcare Concierge  ?[phone] ? ?

## 2022-02-05 DIAGNOSIS — Z794 Long term (current) use of insulin: Secondary | ICD-10-CM | POA: Diagnosis not present

## 2022-02-05 DIAGNOSIS — E118 Type 2 diabetes mellitus with unspecified complications: Secondary | ICD-10-CM | POA: Diagnosis not present

## 2022-02-18 ENCOUNTER — Other Ambulatory Visit: Payer: Self-pay | Admitting: Nurse Practitioner

## 2022-02-18 DIAGNOSIS — K5900 Constipation, unspecified: Secondary | ICD-10-CM

## 2022-02-18 DIAGNOSIS — E119 Type 2 diabetes mellitus without complications: Secondary | ICD-10-CM | POA: Diagnosis not present

## 2022-02-21 DIAGNOSIS — Z794 Long term (current) use of insulin: Secondary | ICD-10-CM | POA: Diagnosis not present

## 2022-02-21 DIAGNOSIS — E118 Type 2 diabetes mellitus with unspecified complications: Secondary | ICD-10-CM | POA: Diagnosis not present

## 2022-02-23 DIAGNOSIS — E119 Type 2 diabetes mellitus without complications: Secondary | ICD-10-CM | POA: Diagnosis not present

## 2022-02-23 DIAGNOSIS — H35033 Hypertensive retinopathy, bilateral: Secondary | ICD-10-CM | POA: Diagnosis not present

## 2022-02-24 ENCOUNTER — Ambulatory Visit: Payer: Medicare Other | Admitting: Nurse Practitioner

## 2022-02-27 ENCOUNTER — Encounter: Payer: Self-pay | Admitting: Internal Medicine

## 2022-03-04 ENCOUNTER — Other Ambulatory Visit: Payer: Self-pay | Admitting: Nurse Practitioner

## 2022-03-04 DIAGNOSIS — M1A00X0 Idiopathic chronic gout, unspecified site, without tophus (tophi): Secondary | ICD-10-CM

## 2022-03-11 ENCOUNTER — Telehealth: Payer: Medicare Other

## 2022-03-12 ENCOUNTER — Ambulatory Visit (INDEPENDENT_AMBULATORY_CARE_PROVIDER_SITE_OTHER): Payer: Medicare Other | Admitting: Podiatry

## 2022-03-12 ENCOUNTER — Encounter: Payer: Self-pay | Admitting: Podiatry

## 2022-03-12 DIAGNOSIS — I739 Peripheral vascular disease, unspecified: Secondary | ICD-10-CM

## 2022-03-12 DIAGNOSIS — B351 Tinea unguium: Secondary | ICD-10-CM | POA: Diagnosis not present

## 2022-03-12 DIAGNOSIS — M79676 Pain in unspecified toe(s): Secondary | ICD-10-CM

## 2022-03-12 DIAGNOSIS — E1159 Type 2 diabetes mellitus with other circulatory complications: Secondary | ICD-10-CM

## 2022-03-12 NOTE — Progress Notes (Signed)
This patient returns to my office for at risk foot care.  This patient requires this care by a professional since this patient will be at risk due to having  Type 2 diabetes and peripheral vascular disease.  .  This patient is unable to cut nails herself since the patient cannot reach her nails.These nails are painful walking and wearing shoes.  This patient presents for at risk foot care today. ? ?General Appearance  Alert, conversant and in no acute stress. ? ?Vascular  Dorsalis pedis and posterior tibial  pulses are barely  palpable  bilaterally.  Capillary return is within normal limits  bilaterally. Temperature is within normal limits  bilaterally. ? ?Neurologic  Senn-Weinstein monofilament wire test diminished  bilaterally. Muscle power within normal limits bilaterally. ? ?Nails Thick disfigured discolored nails with subungual debris  from hallux to fifth toes bilaterally. No evidence of bacterial infection or drainage bilaterally. ? ?Orthopedic  No limitations of motion  feet .  No crepitus or effusions noted.  No bony pathology or digital deformities noted.  Pes planus and HAV  B/L.  DJD STJ left foot. ? ?Skin  normotropic skin with no porokeratosis noted bilaterally.  No signs of infections or ulcers noted.   Pinch callus  left foot  Thin shiny skin ? ?Onychomycosis  Pain in right toes  Pain in left toes   ? ?Consent was obtained for treatment procedures.   Mechanical debridement of nails 1-5  bilaterally performed with a nail nipper.  Filed with dremel without incident.   ? ? ?Return office visit    3 months                  Told patient to return for periodic foot care and evaluation due to potential at risk complications. ? ? ?Gardiner Barefoot DPM  ?

## 2022-03-23 DIAGNOSIS — E118 Type 2 diabetes mellitus with unspecified complications: Secondary | ICD-10-CM | POA: Diagnosis not present

## 2022-03-23 DIAGNOSIS — Z794 Long term (current) use of insulin: Secondary | ICD-10-CM | POA: Diagnosis not present

## 2022-03-24 ENCOUNTER — Other Ambulatory Visit: Payer: Self-pay | Admitting: Nurse Practitioner

## 2022-03-24 DIAGNOSIS — D860 Sarcoidosis of lung: Secondary | ICD-10-CM

## 2022-03-24 DIAGNOSIS — E782 Mixed hyperlipidemia: Secondary | ICD-10-CM

## 2022-03-24 DIAGNOSIS — E1122 Type 2 diabetes mellitus with diabetic chronic kidney disease: Secondary | ICD-10-CM

## 2022-03-24 DIAGNOSIS — I739 Peripheral vascular disease, unspecified: Secondary | ICD-10-CM

## 2022-03-24 DIAGNOSIS — E559 Vitamin D deficiency, unspecified: Secondary | ICD-10-CM

## 2022-03-24 DIAGNOSIS — I10 Essential (primary) hypertension: Secondary | ICD-10-CM

## 2022-03-24 DIAGNOSIS — Z0001 Encounter for general adult medical examination with abnormal findings: Secondary | ICD-10-CM

## 2022-03-24 DIAGNOSIS — E118 Type 2 diabetes mellitus with unspecified complications: Secondary | ICD-10-CM | POA: Diagnosis not present

## 2022-03-24 DIAGNOSIS — R300 Dysuria: Secondary | ICD-10-CM

## 2022-03-24 DIAGNOSIS — Z23 Encounter for immunization: Secondary | ICD-10-CM

## 2022-03-30 DIAGNOSIS — D869 Sarcoidosis, unspecified: Secondary | ICD-10-CM | POA: Diagnosis not present

## 2022-03-30 DIAGNOSIS — M8580 Other specified disorders of bone density and structure, unspecified site: Secondary | ICD-10-CM | POA: Diagnosis not present

## 2022-03-30 DIAGNOSIS — M1A09X0 Idiopathic chronic gout, multiple sites, without tophus (tophi): Secondary | ICD-10-CM | POA: Diagnosis not present

## 2022-03-30 DIAGNOSIS — M159 Polyosteoarthritis, unspecified: Secondary | ICD-10-CM | POA: Diagnosis not present

## 2022-04-01 DIAGNOSIS — Z794 Long term (current) use of insulin: Secondary | ICD-10-CM | POA: Diagnosis not present

## 2022-04-01 DIAGNOSIS — E118 Type 2 diabetes mellitus with unspecified complications: Secondary | ICD-10-CM | POA: Diagnosis not present

## 2022-04-06 DIAGNOSIS — G3184 Mild cognitive impairment, so stated: Secondary | ICD-10-CM | POA: Diagnosis not present

## 2022-04-10 ENCOUNTER — Ambulatory Visit: Payer: Medicare Other | Admitting: Nurse Practitioner

## 2022-04-10 DIAGNOSIS — E118 Type 2 diabetes mellitus with unspecified complications: Secondary | ICD-10-CM | POA: Diagnosis not present

## 2022-04-10 DIAGNOSIS — Z79899 Other long term (current) drug therapy: Secondary | ICD-10-CM | POA: Diagnosis not present

## 2022-04-10 DIAGNOSIS — Z794 Long term (current) use of insulin: Secondary | ICD-10-CM | POA: Diagnosis not present

## 2022-04-14 ENCOUNTER — Ambulatory Visit (INDEPENDENT_AMBULATORY_CARE_PROVIDER_SITE_OTHER): Payer: Medicare Other | Admitting: Nurse Practitioner

## 2022-04-14 ENCOUNTER — Encounter: Payer: Self-pay | Admitting: Nurse Practitioner

## 2022-04-14 VITALS — BP 134/70 | HR 67 | Temp 98.5°F | Resp 16 | Ht 63.5 in | Wt 208.0 lb

## 2022-04-14 DIAGNOSIS — K760 Fatty (change of) liver, not elsewhere classified: Secondary | ICD-10-CM | POA: Diagnosis not present

## 2022-04-14 DIAGNOSIS — Z794 Long term (current) use of insulin: Secondary | ICD-10-CM

## 2022-04-14 DIAGNOSIS — N1831 Chronic kidney disease, stage 3a: Secondary | ICD-10-CM | POA: Diagnosis not present

## 2022-04-14 DIAGNOSIS — E782 Mixed hyperlipidemia: Secondary | ICD-10-CM

## 2022-04-14 DIAGNOSIS — E1165 Type 2 diabetes mellitus with hyperglycemia: Secondary | ICD-10-CM | POA: Diagnosis not present

## 2022-04-14 DIAGNOSIS — M1A00X0 Idiopathic chronic gout, unspecified site, without tophus (tophi): Secondary | ICD-10-CM | POA: Diagnosis not present

## 2022-04-14 DIAGNOSIS — D860 Sarcoidosis of lung: Secondary | ICD-10-CM

## 2022-04-14 MED ORDER — EZETIMIBE 10 MG PO TABS: 10.0000 mg | ORAL_TABLET | Freq: Every day | ORAL | 1 refills | Status: DC

## 2022-04-14 MED ORDER — DAPAGLIFLOZIN PROPANEDIOL 10 MG PO TABS: 10.0000 mg | ORAL_TABLET | Freq: Every day | ORAL | 1 refills | Status: DC

## 2022-04-14 MED ORDER — ATORVASTATIN CALCIUM 10 MG PO TABS: ORAL_TABLET | ORAL | 1 refills | Status: DC

## 2022-04-14 MED ORDER — FEBUXOSTAT 40 MG PO TABS: 40.0000 mg | ORAL_TABLET | Freq: Every day | ORAL | 3 refills | Status: DC

## 2022-04-14 MED ORDER — GABAPENTIN 300 MG PO CAPS: 300.0000 mg | ORAL_CAPSULE | Freq: Three times a day (TID) | ORAL | 1 refills | Status: DC

## 2022-04-14 MED ORDER — FLUTICASONE-SALMETEROL 250-50 MCG/ACT IN AEPB: 1.0000 | INHALATION_SPRAY | Freq: Two times a day (BID) | RESPIRATORY_TRACT | 2 refills | Status: DC

## 2022-04-14 NOTE — Progress Notes (Signed)
[facility] [place], [place] [address]  Internal MEDICINE  Office Visit Note  Patient Name: Chelsey [name]  [id number]  [id number]  Date of Service: 04/14/2022  Chief Complaint  Patient presents with   Diabetes    Pt sees Endo   Hypertension   Follow-up   Gastroesophageal Reflux   Anemia   Asthma   COPD    HPI Montez presents for follow-up visit for hypertension, COPD and gastroesophageal reflux.  She still sees endocrinology for diabetes and her last A1c was 9.7 and she reports that they increased the dose of her medication.  In February patient was changed from allopurinol to febuxostat for gout prevention.  Febuxostat is safer for the kidneys and her kidney function is decreased with a current GFR 45 and creatinine is 1.27.  She is already on [place].  At her previous office visit Celebrex was started at renal dosing which is 100 mg daily max and she is tolerating this current dose and reports that it is helping her joint pains. Her blood pressure and other vital signs are stable and within normal limits.  And she reports that her breathing is doing well, current respiratory status is stable.  Any medications that are in need of refills will be sent today.   Current Medication: Outpatient Encounter Medications as of 04/14/2022  Medication Sig   albuterol (VENTOLIN HFA) 108 (90 Base) MCG/ACT inhaler Inhale 2 puffs into the lungs every 6 (six) hours as needed for wheezing or shortness of breath.   amLODipine (NORVASC) 10 MG tablet TAKE ONE TABLET BY MOUTH ONCE DAILY   aspirin EC 81 MG tablet Take 1 tablet (81 mg total) by mouth daily.   celecoxib (CELEBREX) 100 MG capsule Take 1 capsule (100 mg total) by mouth daily.   cilostazol (PLETAL) 50 MG tablet TAKE ONE TABLET BY MOUTH TWICE DAILY   Dulaglutide 3 MG/0.5ML SOPN Inject 3 mg into the skin once a week.   glucose blood test strip Test sugar 4 x aday for uncontrolled dm on insulin E11.22   HUMULIN 70/30  KWIKPEN (70-30) 100 UNIT/ML KwikPen INJECT 25 UNITS into THE SKIN TWICE DAILY   hydroxychloroquine (PLAQUENIL) 200 MG tablet TAKE ONE TABLET BY MOUTH TWICE DAILY   Insulin Pen Needle (ULTICARE SHORT PEN NEEDLES) 31G X 8 MM MISC USE 3 TIMES A DAY   LANCETS ULTRA THIN MISC Apply 1 each topically daily.    levothyroxine (SYNTHROID) 50 MCG tablet Take 1 tablet in the morning on a empty stomach   LINZESS 145 MCG CAPS capsule TAKE 1 CAPSULE(145 MCG) BY MOUTH DAILY   losartan (COZAAR) 50 MG tablet TAKE 1 TABLET BY MOUTH DAILY FOR HYPENTENSION   omeprazole (PRILOSEC) 20 MG capsule TAKE ONE CAPSULE BY MOUTH TWICE A DAY FOR GERD   OXYGEN Inhale 3 L into the lungs. Nighttime use   Tdap (BOOSTRIX) 5-2.5-18.5 LF-MCG/0.5 injection Inject 0.5 mLs into the muscle once.   Zoster Vaccine Adjuvanted [facility]) injection Inject 0.5 mLs into the muscle once.   [DISCONTINUED] ADVAIR DISKUS 250-50 MCG/ACT AEPB INHALE 1 PUFF BY MOUTH INTO LUNGS IN THE MORNING and AT BEDTIME   [DISCONTINUED] atorvastatin (LIPITOR) 10 MG tablet TAKE 1 TABLET BY MOUTH DAILY FOR CHOLESTEROL   [DISCONTINUED] dapagliflozin propanediol (FARXIGA) 10 MG TABS tablet Take 1 tablet (10 mg total) by mouth daily.   [DISCONTINUED] ezetimibe (ZETIA) 10 MG tablet Take 1 tablet (10 mg total) by mouth daily.   [DISCONTINUED] febuxostat (ULORIC) 40 MG  tablet TAKE ONE TABLET BY MOUTH ONCE DAILY   [DISCONTINUED] gabapentin (NEURONTIN) 300 MG capsule Take 1 capsule (300 mg total) by mouth 3 (three) times daily.   atorvastatin (LIPITOR) 10 MG tablet TAKE 1 TABLET BY MOUTH DAILY FOR CHOLESTEROL   dapagliflozin propanediol (FARXIGA) 10 MG TABS tablet Take 1 tablet (10 mg total) by mouth daily.   ezetimibe (ZETIA) 10 MG tablet Take 1 tablet (10 mg total) by mouth daily.   febuxostat (ULORIC) 40 MG tablet Take 1 tablet (40 mg total) by mouth daily.   fluticasone-salmeterol (ADVAIR DISKUS) 250-50 MCG/ACT AEPB Inhale 1 puff into the lungs in the morning and at  bedtime.   gabapentin (NEURONTIN) 300 MG capsule Take 1 capsule (300 mg total) by mouth 3 (three) times daily.   No facility-administered encounter medications on file as of 04/14/2022.    Surgical History: Past Surgical History:  Procedure Laterality Date   BRAIN SURGERY  1990's   CATARACT EXTRACTION W/PHACO Left 06/11/2016   Procedure: CATARACT EXTRACTION PHACO AND INTRAOCULAR LENS PLACEMENT (IOC);  Surgeon: Birder Robson, MD;  Location: [facility] ORS;  Service: Ophthalmology;  Laterality: Left;  [place] 1.01 AP% 21.6 CDE 13.23 Fluid pack lot # [id number]   CEREBRAL ANEURYSM REPAIR  1990's   COILS   CORONARY ARTERY BYPASS GRAFT     EYE SURGERY  2000's   bilateral cataract   FRACTURE SURGERY     HERNIA REPAIR  2000   ventral   STENT PLACEMENT VASCULAR ([place] HX)     VEIN BYPASS SURGERY     VENTRAL HERNIA REPAIR N/A 04/29/2016   Procedure: Laparoscopic HERNIA REPAIR VENTRAL ADULT;  Surgeon: Jules Husbands, MD;  Location: [facility] ORS;  Service: General;  Laterality: N/A;    Medical History: Past Medical History:  Diagnosis Date   Anemia    Aneurysm ([place])    Arthritis    RHEUMATOID ARTHRITIS   Asthma    Collagen vascular disease ([place])    COPD (chronic obstructive pulmonary disease) ([place])    Coronary artery disease    Dementia (HCC)    Diabetes mellitus without complication (HCC)    Edema    FEET/LEGS   GERD (gastroesophageal reflux disease)    Gout    H/O wheezing    History of hiatal hernia    Hypertension    Hypothyroidism    Neuropathy    Oxygen deficiency    HS   Peripheral vascular disease (HCC)    Sarcoidosis of lung (HCC)    Seizures (HCC)    WITH BRAIN 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    Sleep apnea    OXYGEN AT NIGHT 3 L     Family History: Family History  Problem Relation Age of Onset   Heart disease Mother    Hypertension Mother    Diabetes Mother    Diabetes Father    Heart disease Father    Hypertension Father    Breast cancer Maternal Aunt     Social  History   Socioeconomic History   Marital status: Single    Spouse name: Not on file   Number of children: Not on file   Years of education: Not on file   Highest education level: Not on file  Occupational History   Not on file  Tobacco Use   Smoking status: Former    Packs/day: 1.00    Years: 30.00    Pack years: 30.00    Types: Cigarettes    Quit date: 01/27/2006    Years  since quitting: 16.2   Smokeless tobacco: Never   Tobacco comments:    quit   Substance and Sexual Activity   Alcohol use: No    Alcohol/week: 0.0 standard drinks   Drug use: No   Sexual activity: Not on file  Other Topics Concern   Not on file  Social History Narrative   Not on file   Social Determinants of Health   Financial Resource Strain: Not on file  Food Insecurity: Not on file  Transportation Needs: Not on file  Physical Activity: Not on file  Stress: Not on file  Social Connections: Not on file  Intimate Partner Violence: Not on file      Review of Systems  Constitutional:  Negative for chills, fatigue and unexpected weight change.  HENT:  Negative for congestion, rhinorrhea, sneezing and sore throat.   Eyes:  Negative for redness.  Respiratory:  Negative for cough, chest tightness and shortness of breath.   Cardiovascular:  Negative for chest pain and palpitations.  Gastrointestinal:  Negative for abdominal pain, constipation, diarrhea, nausea and vomiting.  Genitourinary:  Negative for dysuria and frequency.  Musculoskeletal:  Negative for arthralgias, back pain, joint swelling and neck pain.  Skin:  Negative for rash.  Neurological: Negative.  Negative for tremors and numbness.  Hematological:  Negative for adenopathy. Does not bruise/bleed easily.  Psychiatric/Behavioral:  Negative for behavioral problems (Depression), sleep disturbance and suicidal ideas. The patient is not nervous/anxious.    Vital Signs: BP 134/70   Pulse 67   Temp 98.5 F (36.9 C)   Resp 16   Ht 5'  3.5" (1.613 m)   Wt 208 lb (94.3 kg)   SpO2 97%   BMI 36.27 kg/m    Physical Exam Vitals reviewed.  Constitutional:      General: She is not in acute distress.    Appearance: Normal appearance. She is obese. She is not ill-appearing.  HENT:     Head: Normocephalic and atraumatic.  Eyes:     Pupils: Pupils are equal, round, and reactive to light.  Cardiovascular:     Rate and Rhythm: Normal rate and regular rhythm.  Pulmonary:     Effort: Pulmonary effort is normal. No respiratory distress.  Neurological:     Mental Status: She is alert and oriented to person, place, and time.  Psychiatric:        Mood and Affect: Mood normal.        Behavior: Behavior normal.       Assessment/Plan: 1. Chronic kidney disease, stage 3a ([place]) Medications reviewed, renal dosing reviewed.  Lab results from February shows kidney function stable compared to previous results.  Patient is currently on Farxiga  2. NAFLD (nonalcoholic fatty liver disease) Patient is currently on Trulicity and [place].  It is important to have optimal management of kidney disease and diabetes and hypertension to deter further changes in liver function  3. Chronic gouty arthropathy without tophi Most recent uric acid level was less than 3.  Patient is tolerating febuxostat and it is better for her kidney function than the allopurinol. - febuxostat (ULORIC) 40 MG tablet; Take 1 tablet (40 mg total) by mouth daily.  Dispense: 90 tablet; Refill: 3  4. 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  Dispense: 270 capsule; Refill: 1 - dapagliflozin propanediol (FARXIGA) 10 MG TABS tablet; Take 1  tablet (10 mg total) by mouth daily.  Dispense: 90 tablet; Refill: 1  5. Sarcoidosis of lung (HCC) Stable, Advair refills ordered. - fluticasone-salmeterol (ADVAIR DISKUS) 250-50 MCG/ACT AEPB; Inhale 1 puff into the lungs in the morning and at bedtime.  Dispense: 60 each; Refill: 2  6. Mixed hyperlipidemia Patient is currently taking atorvastatin and ezetimibe for elevated cholesterol levels, refills ordered. - ezetimibe (ZETIA) 10 MG tablet; Take 1 tablet (10 mg total) by mouth daily.  Dispense: 90 tablet; Refill: 1 - atorvastatin (LIPITOR) 10 MG tablet; TAKE 1 TABLET BY MOUTH DAILY FOR CHOLESTEROL  Dispense: 90 tablet; Refill: 1   General Counseling: Kehaulani verbalizes understanding of the findings of todays visit and agrees with plan of treatment. I have discussed any further diagnostic evaluation that may be needed or ordered today. We also reviewed her medications today. she has been encouraged to call the office with any questions or concerns that should arise related to todays visit.    No orders of the defined types were placed in this encounter.   Meds ordered this encounter  Medications   gabapentin (NEURONTIN) 300 MG capsule    Sig: Take 1 capsule (300 mg total) by mouth 3 (three) times daily.    Dispense:  270 capsule    Refill:  1   ezetimibe (ZETIA) 10 MG tablet    Sig: Take 1 tablet (10 mg total) by mouth daily.    Dispense:  90 tablet    Refill:  1   dapagliflozin propanediol (FARXIGA) 10 MG TABS tablet    Sig: Take 1 tablet (10 mg total) by mouth daily.    Dispense:  90 tablet    Refill:  1   febuxostat (ULORIC) 40 MG tablet    Sig: Take 1 tablet (40 mg total) by mouth daily.    Dispense:  90 tablet    Refill:  3   atorvastatin (LIPITOR) 10 MG tablet    Sig: TAKE 1 TABLET BY MOUTH DAILY FOR CHOLESTEROL    Dispense:  90 tablet    Refill:  1   fluticasone-salmeterol (ADVAIR DISKUS) 250-50 MCG/ACT AEPB    Sig: Inhale 1 puff into the lungs in the morning and at  bedtime.    Dispense:  60 each    Refill:  2    For future refills    Return in about 3 months (around 07/15/2022) for F/U, Mccartney Chuba PCP.   Total time spent: 30 minutes Time spent includes review of chart, medications, test results, and follow up plan with the patient.   [place] Controlled Substance Database was reviewed by me.  This patient was seen by [name], FNP-C in collaboration with Dr. Clayborn Bigness as a part of collaborative care agreement.   Adin Laker R. Valetta Fuller, MSN, FNP-C Internal medicine

## 2022-04-26 ENCOUNTER — Other Ambulatory Visit: Payer: Self-pay | Admitting: Nurse Practitioner

## 2022-04-26 DIAGNOSIS — M1A00X0 Idiopathic chronic gout, unspecified site, without tophus (tophi): Secondary | ICD-10-CM

## 2022-04-26 DIAGNOSIS — E782 Mixed hyperlipidemia: Secondary | ICD-10-CM

## 2022-04-26 DIAGNOSIS — E1165 Type 2 diabetes mellitus with hyperglycemia: Secondary | ICD-10-CM

## 2022-04-29 ENCOUNTER — Other Ambulatory Visit: Payer: Self-pay | Admitting: Nurse Practitioner

## 2022-04-30 DIAGNOSIS — Z794 Long term (current) use of insulin: Secondary | ICD-10-CM | POA: Diagnosis not present

## 2022-04-30 DIAGNOSIS — E118 Type 2 diabetes mellitus with unspecified complications: Secondary | ICD-10-CM | POA: Diagnosis not present

## 2022-05-06 ENCOUNTER — Other Ambulatory Visit: Payer: Self-pay | Admitting: Nurse Practitioner

## 2022-05-06 DIAGNOSIS — E782 Mixed hyperlipidemia: Secondary | ICD-10-CM

## 2022-05-06 DIAGNOSIS — E1165 Type 2 diabetes mellitus with hyperglycemia: Secondary | ICD-10-CM

## 2022-05-06 DIAGNOSIS — M1A00X0 Idiopathic chronic gout, unspecified site, without tophus (tophi): Secondary | ICD-10-CM

## 2022-05-08 DIAGNOSIS — E118 Type 2 diabetes mellitus with unspecified complications: Secondary | ICD-10-CM | POA: Diagnosis not present

## 2022-05-21 DIAGNOSIS — E119 Type 2 diabetes mellitus without complications: Secondary | ICD-10-CM | POA: Diagnosis not present

## 2022-05-26 ENCOUNTER — Other Ambulatory Visit: Payer: Self-pay | Admitting: Nurse Practitioner

## 2022-05-26 DIAGNOSIS — D860 Sarcoidosis of lung: Secondary | ICD-10-CM

## 2022-05-26 DIAGNOSIS — E1165 Type 2 diabetes mellitus with hyperglycemia: Secondary | ICD-10-CM

## 2022-05-30 DIAGNOSIS — E118 Type 2 diabetes mellitus with unspecified complications: Secondary | ICD-10-CM | POA: Diagnosis not present

## 2022-05-30 DIAGNOSIS — Z794 Long term (current) use of insulin: Secondary | ICD-10-CM | POA: Diagnosis not present

## 2022-06-02 DIAGNOSIS — E118 Type 2 diabetes mellitus with unspecified complications: Secondary | ICD-10-CM | POA: Diagnosis not present

## 2022-06-02 DIAGNOSIS — Z794 Long term (current) use of insulin: Secondary | ICD-10-CM | POA: Diagnosis not present

## 2022-06-15 ENCOUNTER — Other Ambulatory Visit: Payer: Self-pay | Admitting: *Deleted

## 2022-06-15 ENCOUNTER — Encounter: Payer: Self-pay | Admitting: Podiatry

## 2022-06-15 ENCOUNTER — Ambulatory Visit (INDEPENDENT_AMBULATORY_CARE_PROVIDER_SITE_OTHER): Payer: Medicare Other | Admitting: Podiatry

## 2022-06-15 DIAGNOSIS — B351 Tinea unguium: Secondary | ICD-10-CM | POA: Diagnosis not present

## 2022-06-15 DIAGNOSIS — I739 Peripheral vascular disease, unspecified: Secondary | ICD-10-CM

## 2022-06-15 DIAGNOSIS — E1159 Type 2 diabetes mellitus with other circulatory complications: Secondary | ICD-10-CM

## 2022-06-15 DIAGNOSIS — M79676 Pain in unspecified toe(s): Secondary | ICD-10-CM

## 2022-06-15 NOTE — Patient Outreach (Signed)
  Care Coordination   Initial Visit Note   06/15/2022 Name: [name] MRN: [id number] DOB: [birth year]  Chelsey [name] is a 71 y.o. year old female who sees [name], NP for primary care. Ispoke with  Chelsey [name] by phone today and discussed [facility] program. Patient declined services.   Follow up plan: No further intervention required.  Encounter Outcome:  Pt. Visit Completed  Emelia Loron RN, BSN [place] [phone] [email or web address]

## 2022-06-15 NOTE — Progress Notes (Signed)
This patient returns to my office for at risk foot care.  This patient requires this care by a professional since this patient will be at risk due to having  Type 2 diabetes and peripheral vascular disease.  .  This patient is unable to cut nails herself since the patient cannot reach her nails.These nails are painful walking and wearing shoes.  This patient presents for at risk foot care today.  General Appearance  Alert, conversant and in no acute stress.  Vascular  Dorsalis pedis and posterior tibial  pulses are barely  palpable  bilaterally.  Capillary return is within normal limits  bilaterally. Temperature is within normal limits  bilaterally.  Neurologic  Senn-Weinstein monofilament wire test diminished  bilaterally. Muscle power within normal limits bilaterally.  Nails Thick disfigured discolored nails with subungual debris  from hallux to fifth toes bilaterally. No evidence of bacterial infection or drainage bilaterally.  Orthopedic  No limitations of motion  feet .  No crepitus or effusions noted.  No bony pathology or digital deformities noted.  Pes planus and HAV  B/L.  DJD STJ left foot.  Skin  normotropic skin with no porokeratosis noted bilaterally.  No signs of infections or ulcers noted.   Pinch callus  left foot  Thin shiny skin  Onychomycosis  Pain in right toes  Pain in left toes    Consent was obtained for treatment procedures.   Mechanical debridement of nails 1-5  bilaterally performed with a nail nipper.  Filed with dremel without incident.     Return office visit    3 months                  Told patient to return for periodic foot care and evaluation due to potential at risk complications.   Gardiner Barefoot DPM

## 2022-07-01 ENCOUNTER — Other Ambulatory Visit: Payer: Self-pay | Admitting: Nurse Practitioner

## 2022-07-01 DIAGNOSIS — M1A00X0 Idiopathic chronic gout, unspecified site, without tophus (tophi): Secondary | ICD-10-CM

## 2022-07-15 ENCOUNTER — Ambulatory Visit: Payer: Medicare Other | Admitting: Nurse Practitioner

## 2022-07-26 ENCOUNTER — Other Ambulatory Visit: Payer: Self-pay | Admitting: Nurse Practitioner

## 2022-07-26 DIAGNOSIS — K219 Gastro-esophageal reflux disease without esophagitis: Secondary | ICD-10-CM

## 2022-08-19 ENCOUNTER — Other Ambulatory Visit: Payer: Self-pay | Admitting: Internal Medicine

## 2022-08-19 DIAGNOSIS — Z1231 Encounter for screening mammogram for malignant neoplasm of breast: Secondary | ICD-10-CM

## 2022-08-20 ENCOUNTER — Ambulatory Visit: Payer: Medicare Other | Admitting: Podiatry

## 2022-08-20 ENCOUNTER — Ambulatory Visit (INDEPENDENT_AMBULATORY_CARE_PROVIDER_SITE_OTHER): Payer: Medicare Other | Admitting: Podiatry

## 2022-08-20 DIAGNOSIS — E1159 Type 2 diabetes mellitus with other circulatory complications: Secondary | ICD-10-CM

## 2022-08-20 DIAGNOSIS — M79676 Pain in unspecified toe(s): Secondary | ICD-10-CM

## 2022-08-20 DIAGNOSIS — B351 Tinea unguium: Secondary | ICD-10-CM | POA: Diagnosis not present

## 2022-08-20 NOTE — Progress Notes (Signed)
This patient returns to my office for at risk foot care.  This patient requires this care by a professional since this patient will be at risk due to having  Type 2 diabetes and peripheral vascular disease.  .  This patient is unable to cut nails herself since the patient cannot reach her nails.These nails are painful walking and wearing shoes.  This patient presents for at risk foot care today.  General Appearance  Alert, conversant and in no acute stress.  Vascular  Dorsalis pedis and posterior tibial  pulses are barely  palpable  bilaterally.  Capillary return is within normal limits  bilaterally. Temperature is within normal limits  bilaterally.  Neurologic  Senn-Weinstein monofilament wire test diminished  bilaterally. Muscle power within normal limits bilaterally.  Nails Thick disfigured discolored nails with subungual debris  from hallux to fifth toes bilaterally. No evidence of bacterial infection or drainage bilaterally.  Orthopedic  No limitations of motion  feet .  No crepitus or effusions noted.  No bony pathology or digital deformities noted.  Pes planus and HAV  B/L.  DJD STJ left foot.  Skin  normotropic skin with no porokeratosis noted bilaterally.  No signs of infections or ulcers noted.   Pinch callus  left foot  Thin shiny skin  Onychomycosis  Pain in right toes  Pain in left toes    Consent was obtained for treatment procedures.   Mechanical debridement of nails 1-5  bilaterally performed with a nail nipper.  Filed with dremel without incident.     Return office visit    3 months                  Told patient to return for periodic foot care and evaluation due to potential at risk complications. Boneta Lucks D.P.M.

## 2022-08-28 ENCOUNTER — Ambulatory Visit
Admission: RE | Admit: 2022-08-28 | Discharge: 2022-08-28 | Disposition: A | Discharge status: Home / Self Care | Payer: Medicare Other | Source: Ambulatory Visit | Attending: Internal Medicine | Admitting: Internal Medicine

## 2022-08-28 DIAGNOSIS — Z1231 Encounter for screening mammogram for malignant neoplasm of breast: Secondary | ICD-10-CM

## 2022-08-31 ENCOUNTER — Telehealth: Payer: Self-pay | Admitting: Podiatry

## 2022-08-31 ENCOUNTER — Telehealth: Payer: Self-pay | Admitting: *Deleted

## 2022-08-31 NOTE — Telephone Encounter (Signed)
A neuropathy specialist for a diagnosis

## 2022-08-31 NOTE — Telephone Encounter (Signed)
Pt called stating she needed to talk to someone about her feet. The doctor diagnosed her with neuropathy. I asked if she had something going on with her feet and she said she is having pain in both feet and wanted to talk to someone.  I transferred to the nurse triage line.

## 2022-08-31 NOTE — Telephone Encounter (Signed)
Spoke with patient explained that nothing was mentioned in office notes about a neuropathy diagnosis, can she be referred to possible neuropathy , since still having foot pain

## 2022-08-31 NOTE — Telephone Encounter (Signed)
Feet are swollen and painful, and that neuropathy was mentioned during last visit(not in epic notes) is wanting to know what to do for this pain and swelling in feet.  Please advise.

## 2022-09-02 ENCOUNTER — Ambulatory Visit: Payer: Medicare Other | Admitting: Nurse Practitioner

## 2022-09-15 ENCOUNTER — Other Ambulatory Visit: Payer: Self-pay | Admitting: *Deleted

## 2022-09-16 ENCOUNTER — Telehealth: Payer: Self-pay | Admitting: *Deleted

## 2022-09-16 DIAGNOSIS — G8929 Other chronic pain: Secondary | ICD-10-CM

## 2022-09-16 NOTE — Telephone Encounter (Signed)
Faxed referral to [facility] per patient's request, has been seen there before, confirmation received 09/16/22.

## 2022-09-17 ENCOUNTER — Ambulatory Visit (LOCAL_COMMUNITY_HEALTH_CENTER): Payer: Medicare Other

## 2022-09-17 DIAGNOSIS — Z23 Encounter for immunization: Secondary | ICD-10-CM | POA: Diagnosis not present

## 2022-09-17 DIAGNOSIS — Z7185 Encounter for immunization safety counseling: Secondary | ICD-10-CM

## 2022-09-17 NOTE — Progress Notes (Signed)
  Are you feeling sick today? No   Have you ever received a dose of COVID-19 Vaccine? [employer], [place], [place], [place], Other) Yes  If yes, which vaccine and how many doses?    Moderna 4 doses  Did you bring the vaccination record card or other documentation?  No   Do you have a health condition or are undergoing treatment that makes you moderately or severely immunocompromised? This would include, but not be limited to: cancer, HIV, organ transplant, immunosuppressive therapy/high-dose corticosteroids, or moderate/severe primary immunodeficiency.  No  Have you received COVID-19 vaccine before or during hematopoietic cell transplant (HCT) or CAR-T-cell therapies? No  Have you ever had an allergic reaction to: (This would include a severe allergic reaction or a reaction that caused hives, swelling, or respiratory distress, including wheezing.) A component of a COVID-19 vaccine or a previous dose of COVID-19 vaccine? No   Have you ever had an allergic reaction to another vaccine (other thanCOVID-19 vaccine) or an injectable medication? (This would include a severe allergic reaction or a reaction that caused hives, swelling, or respiratory distress, including wheezing.)   No    Do you have a history of any of the following:  Myocarditis or Pericarditis No  Dermal fillers:  No  Multisystem Inflammatory Syndrome (MIS-C or MIS-A)? No  COVID-19 disease within the past 3 months? No  Vaccinated with monkeypox vaccine in the last 4 weeks? No   In to nurse clinic for [place] vaccine. Tolerated well R. Delt. Updated [facility] and copy given to patient.

## 2022-09-30 ENCOUNTER — Other Ambulatory Visit: Payer: Self-pay | Admitting: Nurse Practitioner

## 2022-09-30 DIAGNOSIS — E782 Mixed hyperlipidemia: Secondary | ICD-10-CM

## 2022-09-30 DIAGNOSIS — Z794 Long term (current) use of insulin: Secondary | ICD-10-CM

## 2022-09-30 DIAGNOSIS — Z23 Encounter for immunization: Secondary | ICD-10-CM

## 2022-09-30 DIAGNOSIS — E559 Vitamin D deficiency, unspecified: Secondary | ICD-10-CM

## 2022-09-30 DIAGNOSIS — Z0001 Encounter for general adult medical examination with abnormal findings: Secondary | ICD-10-CM

## 2022-09-30 DIAGNOSIS — I10 Essential (primary) hypertension: Secondary | ICD-10-CM

## 2022-09-30 DIAGNOSIS — D860 Sarcoidosis of lung: Secondary | ICD-10-CM

## 2022-09-30 DIAGNOSIS — I739 Peripheral vascular disease, unspecified: Secondary | ICD-10-CM

## 2022-09-30 DIAGNOSIS — E1122 Type 2 diabetes mellitus with diabetic chronic kidney disease: Secondary | ICD-10-CM

## 2022-09-30 DIAGNOSIS — R300 Dysuria: Secondary | ICD-10-CM

## 2022-09-30 NOTE — Telephone Encounter (Signed)
Please review its ok to send  

## 2022-10-05 ENCOUNTER — Encounter: Payer: Self-pay | Admitting: Nurse Practitioner

## 2022-10-05 ENCOUNTER — Ambulatory Visit (INDEPENDENT_AMBULATORY_CARE_PROVIDER_SITE_OTHER): Payer: Medicare Other | Admitting: Nurse Practitioner

## 2022-10-05 VITALS — BP 140/74 | HR 66 | Temp 96.0°F | Resp 16 | Ht 63.5 in | Wt 219.2 lb

## 2022-10-05 DIAGNOSIS — I10 Essential (primary) hypertension: Secondary | ICD-10-CM | POA: Diagnosis not present

## 2022-10-05 DIAGNOSIS — N1831 Chronic kidney disease, stage 3a: Secondary | ICD-10-CM

## 2022-10-05 DIAGNOSIS — R600 Localized edema: Secondary | ICD-10-CM

## 2022-10-05 MED ORDER — OLMESARTAN-AMLODIPINE-HCTZ 40-10-12.5 MG PO TABS: 1.0000 | ORAL_TABLET | Freq: Every day | ORAL | 2 refills | Status: DC

## 2022-10-05 NOTE — Progress Notes (Signed)
[facility] [place], [place] [address]  Internal MEDICINE  Office Visit Note  Patient Name: Chelsey [name]  [id number]  [id number]  Date of Service: 10/05/2022  Chief Complaint  Patient presents with   Acute Visit    Bilateral feet swelling     HPI Najee presents for an acute sick visit for bilateral lower extremity edema --onset: 2 months ago --location: bilateral legs and feet --duration: constant --Associated sx: painful related to swelling,  no SOB or pain with walking --Severity: moderate.     Current Medication:  Outpatient Encounter Medications as of 10/05/2022  Medication Sig   albuterol (VENTOLIN HFA) 108 (90 Base) MCG/ACT inhaler Inhale 2 puffs into the lungs every 6 (six) hours as needed for wheezing or shortness of breath.   aspirin EC 81 MG tablet Take 1 tablet (81 mg total) by mouth daily.   atorvastatin (LIPITOR) 10 MG tablet TAKE 1 TABLET BY MOUTH DAILY FOR CHOLESTEROL   celecoxib (CELEBREX) 100 MG capsule TAKE 1 CAPSULE(100 MG) BY MOUTH DAILY   cilostazol (PLETAL) 50 MG tablet TAKE ONE TABLET BY MOUTH TWICE DAILY   COMFORT EZ PEN NEEDLES 31G X 8 MM MISC USE THREE TIMES DAILY   Dulaglutide 3 MG/0.5ML SOPN Inject 3 mg into the skin once a week.   ezetimibe (ZETIA) 10 MG tablet Take 1 tablet (10 mg total) by mouth daily.   FARXIGA 10 MG TABS tablet TAKE ONE TABLET BY MOUTH ONCE DAILY   febuxostat (ULORIC) 40 MG tablet Take 1 tablet (40 mg total) by mouth daily.   fluticasone-salmeterol (ADVAIR) 250-50 MCG/ACT AEPB INHALE 1 PUFF BY MOUTH INTO LUNGS IN THE MORNING and AT BEDTIME   gabapentin (NEURONTIN) 300 MG capsule TAKE ONE CAPSULE BY MOUTH THREE TIMES DAILY   glucose blood test strip Test sugar 4 x aday for uncontrolled dm on insulin E11.22   HUMULIN 70/30 KWIKPEN (70-30) 100 UNIT/ML KwikPen INJECT 25 UNITS into THE SKIN TWICE DAILY   hydroxychloroquine (PLAQUENIL) 200 MG tablet TAKE ONE TABLET BY MOUTH TWICE DAILY    LANCETS ULTRA THIN MISC Apply 1 each topically daily.    levothyroxine (SYNTHROID) 50 MCG tablet Take 1 tablet in the morning on a empty stomach   LINZESS 145 MCG CAPS capsule TAKE 1 CAPSULE(145 MCG) BY MOUTH DAILY   omeprazole (PRILOSEC) 20 MG capsule TAKE ONE CAPSULE BY MOUTH TWICE DAILY FOR gerd   OXYGEN Inhale 3 L into the lungs. Nighttime use   Tdap (BOOSTRIX) 5-2.5-18.5 LF-MCG/0.5 injection Inject 0.5 mLs into the muscle once.   Zoster Vaccine Adjuvanted [facility]) injection Inject 0.5 mLs into the muscle once.   [DISCONTINUED] amLODipine (NORVASC) 10 MG tablet TAKE ONE TABLET BY MOUTH ONCE DAILY   [DISCONTINUED] losartan (COZAAR) 50 MG tablet TAKE 1 TABLET BY MOUTH DAILY FOR HYPENTENSION   [DISCONTINUED] Olmesartan-amLODIPine-HCTZ 40-10-12.5 MG TABS Take 1 tablet by mouth daily with breakfast.   Olmesartan-amLODIPine-HCTZ 40-10-12.5 MG TABS Take 1 tablet by mouth daily with breakfast.   No facility-administered encounter medications on file as of 10/05/2022.      Medical History: Past Medical History:  Diagnosis Date   Anemia    Aneurysm (HCC)    Arthritis    RHEUMATOID ARTHRITIS   Asthma    Collagen vascular disease (HCC)    COPD (chronic obstructive pulmonary disease) ([place])    Coronary artery disease    Dementia (HCC)    Diabetes mellitus without complication (HCC)    Edema    FEET/LEGS  GERD (gastroesophageal reflux disease)    Gout    H/O wheezing    History of hiatal hernia    Hypertension    Hypothyroidism    Neuropathy    Oxygen deficiency    HS   Peripheral vascular disease (HCC)    Sarcoidosis of lung (HCC)    Seizures (HCC)    WITH BRAIN 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    Sleep apnea    OXYGEN AT NIGHT 3 L [place]     Vital Signs: BP (!) 144/74   Pulse 66   Temp (!) 96 F (35.6 C)   Resp 16   Ht 5' 3.5" (1.613 m)   Wt 219 lb 3.2 oz (99.4 kg)   SpO2 95%   BMI 38.22 kg/m    Review of Systems  Constitutional:  Positive for fatigue. Negative for  appetite change, chills and unexpected weight change.  Respiratory:  Negative for cough, chest tightness, shortness of breath and wheezing.   Cardiovascular:  Positive for leg swelling. Negative for chest pain and palpitations.  Gastrointestinal: Negative.   Musculoskeletal:  Positive for arthralgias.  Skin: Negative.  Negative for color change.  Neurological: Negative.     Physical Exam Vitals reviewed.  Constitutional:      General: She is not in acute distress.    Appearance: Normal appearance. She is obese. She is not ill-appearing.  HENT:     Head: Normocephalic and atraumatic.  Eyes:     Pupils: Pupils are equal, round, and reactive to light.  Cardiovascular:     Rate and Rhythm: Normal rate and regular rhythm.     Heart sounds: Normal heart sounds. No murmur heard. Pulmonary:     Effort: Pulmonary effort is normal. No respiratory distress.     Breath sounds: Normal breath sounds. No wheezing.  Musculoskeletal:     Right lower leg: 1+ Edema (nonpitting) present.     Left lower leg: 1+ Edema (nonpitting) present.  Neurological:     Mental Status: She is alert and oriented to person, place, and time.  Psychiatric:        Mood and Affect: Mood normal.        Behavior: Behavior normal.       Assessment/Plan: 1. Bilateral lower extremity edema Combined BP medications to make it easier for her to take them. Added HCTZ for diuretic to help decreased lower extremity edema. Start new medication tomorrow. Losartan and amlodipine were discontinued. Follow up in 4 weeks to reassess swelling and blood pressure - Olmesartan-amLODIPine-HCTZ 40-10-12.5 MG TABS; Take 1 tablet by mouth daily with breakfast.  Dispense: 30 tablet; Refill: 2  2. Essential hypertension Losartan and amlodipine discontinued. Triple therapy tablet prescribed. Switched to olmesartan and added HCTZ, follow up in 4 weeks.  - Olmesartan-amLODIPine-HCTZ 40-10-12.5 MG TABS; Take 1 tablet by mouth daily with  breakfast.  Dispense: 30 tablet; Refill: 2   General Counseling: Jaelyne verbalizes understanding of the findings of todays visit and agrees with plan of treatment. I have discussed any further diagnostic evaluation that may be needed or ordered today. We also reviewed her medications today. she has been encouraged to call the office with any questions or concerns that should arise related to todays visit.    Counseling:    No orders of the defined types were placed in this encounter.   Meds ordered this encounter  Medications   DISCONTD: Olmesartan-amLODIPine-HCTZ 40-10-12.5 MG TABS    Sig: Take 1 tablet by mouth daily with breakfast.  Dispense:  30 tablet    Refill:  2    Please discontinue losartan and amlodipine, and start this script with her med delivery scheduled for tomorrow 10/06/22. Thank you   Olmesartan-amLODIPine-HCTZ 40-10-12.5 MG TABS    Sig: Take 1 tablet by mouth daily with breakfast.    Dispense:  30 tablet    Refill:  2    Please discontinue losartan and amlodipine, and start this script with her med delivery scheduled for tomorrow 10/06/22. Thank you    Return in about 4 weeks (around 11/02/2022) for F/U, BP check, Donivin Wirt PCP.  [place] Controlled Substance Database was reviewed by me for overdose risk score (ORS)  Time spent:20 Minutes Time spent with patient included reviewing progress notes, labs, imaging studies, and discussing plan for follow up.   This patient was seen by [name], FNP-C in collaboration with Dr. Clayborn Bigness as a part of collaborative care agreement.  Colbey Wirtanen R. Valetta Fuller, MSN, FNP-C Internal Medicine

## 2022-10-06 ENCOUNTER — Encounter: Payer: Self-pay | Admitting: Nurse Practitioner

## 2022-10-06 DIAGNOSIS — R600 Localized edema: Secondary | ICD-10-CM | POA: Insufficient documentation

## 2022-10-30 ENCOUNTER — Encounter: Payer: Self-pay | Admitting: Nurse Practitioner

## 2022-10-30 ENCOUNTER — Ambulatory Visit (INDEPENDENT_AMBULATORY_CARE_PROVIDER_SITE_OTHER): Payer: Medicare Other | Admitting: Nurse Practitioner

## 2022-10-30 VITALS — BP 119/72 | HR 70 | Temp 98.1°F | Resp 16 | Ht 63.5 in | Wt 219.8 lb

## 2022-10-30 DIAGNOSIS — I10 Essential (primary) hypertension: Secondary | ICD-10-CM | POA: Diagnosis not present

## 2022-10-30 DIAGNOSIS — R600 Localized edema: Secondary | ICD-10-CM | POA: Diagnosis not present

## 2022-10-30 DIAGNOSIS — N1831 Chronic kidney disease, stage 3a: Secondary | ICD-10-CM | POA: Diagnosis not present

## 2022-10-30 MED ORDER — HYDROCHLOROTHIAZIDE 12.5 MG PO TABS: 12.5000 mg | ORAL_TABLET | Freq: Every day | ORAL | 0 refills | Status: DC

## 2022-10-30 MED ORDER — OLMESARTAN-AMLODIPINE-HCTZ 40-10-25 MG PO TABS: 1.0000 | ORAL_TABLET | Freq: Every day | ORAL | 0 refills | Status: DC

## 2022-10-30 NOTE — Progress Notes (Signed)
[facility] [place], [place] [address]  Internal MEDICINE  Office Visit Note  Patient Name: Chelsey [name]  [id number]  [id number]  Date of Service: 10/30/2022  Chief Complaint  Patient presents with   Follow-up   Diabetes   Gastroesophageal Reflux   Hypertension    HPI Lovetta presents for a follow up visit for hypertension and lower extremity edema Bilateral lower extremity edema --still present not improved much Hypertension -- significantly improved BP Diabetes -- sees endocrinology at [facility] CKD stage 3a -- stable.     Current Medication: Outpatient Encounter Medications as of 10/30/2022  Medication Sig   albuterol (VENTOLIN HFA) 108 (90 Base) MCG/ACT inhaler Inhale 2 puffs into the lungs every 6 (six) hours as needed for wheezing or shortness of breath.   aspirin EC 81 MG tablet Take 1 tablet (81 mg total) by mouth daily.   atorvastatin (LIPITOR) 10 MG tablet TAKE 1 TABLET BY MOUTH DAILY FOR CHOLESTEROL   celecoxib (CELEBREX) 100 MG capsule TAKE 1 CAPSULE(100 MG) BY MOUTH DAILY   cilostazol (PLETAL) 50 MG tablet TAKE ONE TABLET BY MOUTH TWICE DAILY   COMFORT EZ PEN NEEDLES 31G X 8 MM MISC USE THREE TIMES DAILY   Dulaglutide 3 MG/0.5ML SOPN Inject 3 mg into the skin once a week.   ezetimibe (ZETIA) 10 MG tablet Take 1 tablet (10 mg total) by mouth daily.   FARXIGA 10 MG TABS tablet TAKE ONE TABLET BY MOUTH ONCE DAILY   febuxostat (ULORIC) 40 MG tablet Take 1 tablet (40 mg total) by mouth daily.   fluticasone-salmeterol (ADVAIR) 250-50 MCG/ACT AEPB INHALE 1 PUFF BY MOUTH INTO LUNGS IN THE MORNING and AT BEDTIME   gabapentin (NEURONTIN) 300 MG capsule TAKE ONE CAPSULE BY MOUTH THREE TIMES DAILY   glucose blood test strip Test sugar 4 x aday for uncontrolled dm on insulin E11.22   HUMULIN 70/30 KWIKPEN (70-30) 100 UNIT/ML KwikPen INJECT 25 UNITS into THE SKIN TWICE DAILY   hydrochlorothiazide (HYDRODIURIL) 12.5 MG tablet Take 1 tablet (12.5 mg  total) by mouth daily.   hydroxychloroquine (PLAQUENIL) 200 MG tablet TAKE ONE TABLET BY MOUTH TWICE DAILY   LANCETS ULTRA THIN MISC Apply 1 each topically daily.    levothyroxine (SYNTHROID) 50 MCG tablet Take 1 tablet in the morning on a empty stomach   LINZESS 145 MCG CAPS capsule TAKE 1 CAPSULE(145 MCG) BY MOUTH DAILY   Olmesartan-amLODIPine-HCTZ 40-10-25 MG TABS Take 1 tablet by mouth daily.   omeprazole (PRILOSEC) 20 MG capsule TAKE ONE CAPSULE BY MOUTH TWICE DAILY FOR gerd   OXYGEN Inhale 3 L into the lungs. Nighttime use   Tdap (BOOSTRIX) 5-2.5-18.5 LF-MCG/0.5 injection Inject 0.5 mLs into the muscle once.   Zoster Vaccine Adjuvanted [facility]) injection Inject 0.5 mLs into the muscle once.   [DISCONTINUED] Olmesartan-amLODIPine-HCTZ 40-10-12.5 MG TABS Take 1 tablet by mouth daily with breakfast.   No facility-administered encounter medications on file as of 10/30/2022.    Surgical History: Past Surgical History:  Procedure Laterality Date   BRAIN SURGERY  1990's   CATARACT EXTRACTION W/PHACO Left 06/11/2016   Procedure: CATARACT EXTRACTION PHACO AND INTRAOCULAR LENS PLACEMENT (IOC);  Surgeon: Birder Robson, MD;  Location: [facility] ORS;  Service: Ophthalmology;  Laterality: Left;  [place] 1.01 AP% 21.6 CDE 13.23 Fluid pack lot # [id number]   CEREBRAL ANEURYSM REPAIR  1990's   COILS   CORONARY ARTERY BYPASS GRAFT     EYE SURGERY  2000's   bilateral cataract  FRACTURE SURGERY     HERNIA REPAIR  2000   ventral   STENT PLACEMENT VASCULAR ([place] HX)     VEIN BYPASS SURGERY     VENTRAL HERNIA REPAIR N/A 04/29/2016   Procedure: Laparoscopic HERNIA REPAIR VENTRAL ADULT;  Surgeon: Jules Husbands, MD;  Location: [facility] ORS;  Service: General;  Laterality: N/A;    Medical History: Past Medical History:  Diagnosis Date   Anemia    Aneurysm ([place])    Arthritis    RHEUMATOID ARTHRITIS   Asthma    Collagen vascular disease ([place])    COPD (chronic obstructive pulmonary disease) ([place])     Coronary artery disease    Dementia (HCC)    Diabetes mellitus without complication (HCC)    Edema    FEET/LEGS   GERD (gastroesophageal reflux disease)    Gout    H/O wheezing    History of hiatal hernia    Hypertension    Hypothyroidism    Neuropathy    Oxygen deficiency    HS   Peripheral vascular disease (HCC)    Sarcoidosis of lung (HCC)    Seizures (HCC)    WITH BRAIN 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    Sleep apnea    OXYGEN AT NIGHT 3 L [place]    Family History: Family History  Problem Relation Age of Onset   Heart disease Mother    Hypertension Mother    Diabetes Mother    Diabetes Father    Heart disease Father    Hypertension Father    Breast cancer Maternal Aunt     Social History   Socioeconomic History   Marital status: Single    Spouse name: Not on file   Number of children: Not on file   Years of education: Not on file   Highest education level: Not on file  Occupational History   Not on file  Tobacco Use   Smoking status: Former    Packs/day: 1.00    Years: 30.00    Total pack years: 30.00    Types: Cigarettes    Quit date: 01/27/2006    Years since quitting: 16.7   Smokeless tobacco: Never   Tobacco comments:    quit   Substance and Sexual Activity   Alcohol use: No    Alcohol/week: 0.0 standard drinks of alcohol   Drug use: No   Sexual activity: Not on file  Other Topics Concern   Not on file  Social History Narrative   Not on file   Social Determinants of Health   Financial Resource Strain: Not on file  Food Insecurity: Not on file  Transportation Needs: Not on file  Physical Activity: Not on file  Stress: Not on file  Social Connections: Not on file  Intimate Partner Violence: Not on file      Review of Systems  Constitutional:  Positive for fatigue. Negative for appetite change, chills and unexpected weight change.  Respiratory:  Negative for cough, chest tightness, shortness of breath and wheezing.   Cardiovascular:  Positive  for leg swelling. Negative for chest pain and palpitations.  Gastrointestinal: Negative.   Musculoskeletal:  Positive for arthralgias.  Skin: Negative.  Negative for color change.  Neurological: Negative.     Vital Signs: BP 119/72   Pulse 70   Temp 98.1 F (36.7 C)   Resp 16   Ht 5' 3.5" (1.613 m)   Wt 219 lb 12.8 oz (99.7 kg)   SpO2 95%   BMI 38.33 kg/m  Physical Exam Vitals reviewed.  Constitutional:      General: She is not in acute distress.    Appearance: Normal appearance. She is obese. She is not ill-appearing.  HENT:     Head: Normocephalic and atraumatic.  Eyes:     Pupils: Pupils are equal, round, and reactive to light.  Cardiovascular:     Rate and Rhythm: Normal rate and regular rhythm.     Heart sounds: Normal heart sounds. No murmur heard. Pulmonary:     Effort: Pulmonary effort is normal. No respiratory distress.     Breath sounds: Normal breath sounds. No wheezing.  Musculoskeletal:     Right lower leg: 1+ Edema (nonpitting) present.     Left lower leg: 1+ Edema (nonpitting) present.  Neurological:     Mental Status: She is alert and oriented to person, place, and time.  Psychiatric:        Mood and Affect: Mood normal.        Behavior: Behavior normal.        Assessment/Plan: 1. Bilateral lower extremity edema Increase HCTZ dose to 25 mg. Added a 12.5 mg tab for the next 6-7 days then she will start the new triple therapy tablet with 25 mg HCTZ in it. Follow up in 3 weeks for improvement.  - hydrochlorothiazide (HYDRODIURIL) 12.5 MG tablet; Take 1 tablet (12.5 mg total) by mouth daily.  Dispense: 7 tablet; Refill: 0 - Olmesartan-amLODIPine-HCTZ 40-10-25 MG TABS; Take 1 tablet by mouth daily.  Dispense: 30 tablet; Refill: 0  2. Essential hypertension Added HCTZ 12.5 mg daily, x6 days then will start new triple therapy with the added HCTZ dose of 25 mg when [facility] delivers her medications on 12/21.  - hydrochlorothiazide (HYDRODIURIL) 12.5  MG tablet; Take 1 tablet (12.5 mg total) by mouth daily.  Dispense: 7 tablet; Refill: 0 - Olmesartan-amLODIPine-HCTZ 40-10-25 MG TABS; Take 1 tablet by mouth daily.  Dispense: 30 tablet; Refill: 0  3. Chronic kidney disease, stage 3a (HCC) Stable, no significant changes   General Counseling: Gracilyn verbalizes understanding of the findings of todays visit and agrees with plan of treatment. I have discussed any further diagnostic evaluation that may be needed or ordered today. We also reviewed her medications today. she has been encouraged to call the office with any questions or concerns that should arise related to todays visit.    No orders of the defined types were placed in this encounter.   Meds ordered this encounter  Medications   hydrochlorothiazide (HYDRODIURIL) 12.5 MG tablet    Sig: Take 1 tablet (12.5 mg total) by mouth daily.    Dispense:  7 tablet    Refill:  0   Olmesartan-amLODIPine-HCTZ 40-10-25 MG TABS    Sig: Take 1 tablet by mouth daily.    Dispense:  30 tablet    Refill:  0    Please discontinue previous order and note increased dose, please send with 12/21 delivery to patient    Return in about 4 weeks (around 11/27/2022) for F/U, BP check, eval new med, [place] PCP.   Total time spent:30 Minutes Time spent includes review of chart, medications, test results, and follow up plan with the patient.   [place] Controlled Substance Database was reviewed by me.  This patient was seen by [name], FNP-C in collaboration with Dr. Clayborn Bigness as a part of collaborative care agreement.   Emoni Yang R. Valetta Fuller, MSN, FNP-C Internal medicine

## 2022-11-04 ENCOUNTER — Other Ambulatory Visit: Payer: Self-pay | Admitting: Nurse Practitioner

## 2022-11-04 DIAGNOSIS — E782 Mixed hyperlipidemia: Secondary | ICD-10-CM

## 2022-11-17 ENCOUNTER — Telehealth: Payer: Self-pay

## 2022-11-17 NOTE — Telephone Encounter (Signed)
Faxed note to [facility] [phone] for diabetic supplies

## 2022-11-19 ENCOUNTER — Ambulatory Visit: Payer: Medicare Other | Admitting: Podiatry

## 2022-11-24 ENCOUNTER — Encounter: Payer: Self-pay | Admitting: Nurse Practitioner

## 2022-11-24 ENCOUNTER — Telehealth: Payer: Self-pay

## 2022-11-24 ENCOUNTER — Ambulatory Visit (INDEPENDENT_AMBULATORY_CARE_PROVIDER_SITE_OTHER): Payer: Medicare Other | Admitting: Nurse Practitioner

## 2022-11-24 VITALS — BP 142/63 | HR 64 | Temp 97.3°F | Resp 16 | Ht 63.5 in | Wt 222.6 lb

## 2022-11-24 DIAGNOSIS — K1379 Other lesions of oral mucosa: Secondary | ICD-10-CM

## 2022-11-24 DIAGNOSIS — H1033 Unspecified acute conjunctivitis, bilateral: Secondary | ICD-10-CM

## 2022-11-24 DIAGNOSIS — J029 Acute pharyngitis, unspecified: Secondary | ICD-10-CM | POA: Diagnosis not present

## 2022-11-24 DIAGNOSIS — J0130 Acute sphenoidal sinusitis, unspecified: Secondary | ICD-10-CM | POA: Diagnosis not present

## 2022-11-24 LAB — POCT RAPID STREP A (OFFICE): Rapid Strep A Screen: NEGATIVE

## 2022-11-24 MED ORDER — OFLOXACIN 0.3 % OP SOLN: 1.0000 [drp] | Freq: Four times a day (QID) | OPHTHALMIC | 0 refills | Status: AC

## 2022-11-24 MED ORDER — DOXYCYCLINE HYCLATE 100 MG PO TABS: 100.0000 mg | ORAL_TABLET | Freq: Two times a day (BID) | ORAL | 0 refills | Status: AC

## 2022-11-24 MED ORDER — NYSTATIN 100000 UNIT/ML MT SUSP: 5.0000 mL | Freq: Three times a day (TID) | OROMUCOSAL | 0 refills | Status: DC | PRN

## 2022-11-24 MED ORDER — NYSTATIN 100000 UNIT/ML MT SUSP: 5.0000 mL | Freq: Four times a day (QID) | OROMUCOSAL | 0 refills | Status: AC

## 2022-11-24 NOTE — Telephone Encounter (Signed)
Pt advised we send nystatin

## 2022-11-24 NOTE — Progress Notes (Signed)
[facility] [place], [place] [address]  Internal MEDICINE  Office Visit Note  Patient Name: Chelsey [name]  [id number]  [id number]  Date of Service: 11/24/2022  Chief Complaint  Patient presents with   Acute Visit    Tongue soreness     HPI Saniyah presents for an acute sick visit for sore throat Symptoms started 1-2 weeks ago Sore throat, difficulty swallowing, pain on left side of throat, watery itchy eyes, sinus pressure, sore tongue, nasal congestion, runny nose, ear pressure.  Has not tried any OTC medications.       Current Medication:  Outpatient Encounter Medications as of 11/24/2022  Medication Sig   albuterol (VENTOLIN HFA) 108 (90 Base) MCG/ACT inhaler Inhale 2 puffs into the lungs every 6 (six) hours as needed for wheezing or shortness of breath.   aspirin EC 81 MG tablet Take 1 tablet (81 mg total) by mouth daily.   atorvastatin (LIPITOR) 10 MG tablet TAKE ONE TABLET BY MOUTH ONCE DAILY FOR cholesterol   celecoxib (CELEBREX) 100 MG capsule TAKE 1 CAPSULE(100 MG) BY MOUTH DAILY   cilostazol (PLETAL) 50 MG tablet TAKE ONE TABLET BY MOUTH TWICE DAILY   COMFORT EZ PEN NEEDLES 31G X 8 MM MISC USE THREE TIMES DAILY   doxycycline (VIBRA-TABS) 100 MG tablet Take 1 tablet (100 mg total) by mouth 2 (two) times daily for 10 days. Take with food   Dulaglutide 3 MG/0.5ML SOPN Inject 3 mg into the skin once a week.   ezetimibe (ZETIA) 10 MG tablet TAKE ONE TABLET BY MOUTH ONCE DAILY   FARXIGA 10 MG TABS tablet TAKE ONE TABLET BY MOUTH ONCE DAILY   febuxostat (ULORIC) 40 MG tablet Take 1 tablet (40 mg total) by mouth daily.   fluticasone-salmeterol (ADVAIR) 250-50 MCG/ACT AEPB INHALE 1 PUFF BY MOUTH INTO LUNGS IN THE MORNING and AT BEDTIME   gabapentin (NEURONTIN) 300 MG capsule TAKE ONE CAPSULE BY MOUTH THREE TIMES DAILY   glucose blood test strip Test sugar 4 x aday for uncontrolled dm on insulin E11.22   HUMULIN 70/30 KWIKPEN (70-30) 100 UNIT/ML  KwikPen INJECT 25 UNITS into THE SKIN TWICE DAILY   hydrochlorothiazide (HYDRODIURIL) 12.5 MG tablet Take 1 tablet (12.5 mg total) by mouth daily.   hydroxychloroquine (PLAQUENIL) 200 MG tablet TAKE ONE TABLET BY MOUTH TWICE DAILY   LANCETS ULTRA THIN MISC Apply 1 each topically daily.    levothyroxine (SYNTHROID) 50 MCG tablet TAKE ONE TABLET BY MOUTH EVERY MORNING ON AN EMPTY STOMACH   LINZESS 145 MCG CAPS capsule TAKE 1 CAPSULE(145 MCG) BY MOUTH DAILY   magic mouthwash (nystatin, lidocaine, diphenhydrAMINE) suspension Take 5 mLs by mouth 3 (three) times daily as needed for mouth pain.   ofloxacin (OCUFLOX) 0.3 % ophthalmic solution Place 1 drop into both eyes 4 (four) times daily for 5 days.   Olmesartan-amLODIPine-HCTZ 40-10-25 MG TABS Take 1 tablet by mouth daily.   omeprazole (PRILOSEC) 20 MG capsule TAKE ONE CAPSULE BY MOUTH TWICE DAILY FOR gerd   OXYGEN Inhale 3 L into the lungs. Nighttime use   Tdap (BOOSTRIX) 5-2.5-18.5 LF-MCG/0.5 injection Inject 0.5 mLs into the muscle once.   Zoster Vaccine Adjuvanted [facility]) injection Inject 0.5 mLs into the muscle once.   No facility-administered encounter medications on file as of 11/24/2022.      Medical History: Past Medical History:  Diagnosis Date   Anemia    Aneurysm (HCC)    Arthritis    RHEUMATOID ARTHRITIS  Asthma    Collagen vascular disease (HCC)    COPD (chronic obstructive pulmonary disease) (HCC)    Coronary artery disease    Dementia (HCC)    Diabetes mellitus without complication (HCC)    Edema    FEET/LEGS   GERD (gastroesophageal reflux disease)    Gout    H/O wheezing    History of hiatal hernia    Hypertension    Hypothyroidism    Neuropathy    Oxygen deficiency    HS   Peripheral vascular disease (HCC)    Sarcoidosis of lung (HCC)    Seizures (HCC)    WITH BRAIN 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    Sleep apnea    OXYGEN AT NIGHT 3 L [place]     Vital Signs: BP (!) 142/63   Pulse 64   Temp (!) 97.3 F  (36.3 C)   Resp 16   Ht 5' 3.5" (1.613 m)   Wt 222 lb 9.6 oz (101 kg)   SpO2 93%   BMI 38.81 kg/m    Review of Systems  Constitutional:  Positive for appetite change and fatigue. Negative for chills and fever.  HENT:  Positive for congestion, ear pain, mouth sores, postnasal drip, rhinorrhea, sinus pressure, sinus pain, sore throat and trouble swallowing. Negative for tinnitus.   Eyes:  Positive for discharge, redness and itching.  Respiratory:  Positive for cough. Negative for chest tightness, shortness of breath and wheezing.   Cardiovascular:  Negative for chest pain and palpitations.  Gastrointestinal:  Negative for constipation, diarrhea, nausea and vomiting.  Musculoskeletal:  Positive for neck pain (cervical lymph nodes, soreness).  Neurological:  Negative for headaches.    Physical Exam Vitals reviewed.  Constitutional:      General: She is not in acute distress.    Appearance: Normal appearance. She is ill-appearing.  HENT:     Head: Normocephalic and atraumatic.     Right Ear: A middle ear effusion is present.     Left Ear: Tympanic membrane, ear canal and external ear normal.     Nose: Mucosal edema, congestion and rhinorrhea present.     Right Turbinates: Swollen and pale.     Left Turbinates: Swollen and pale.     Mouth/Throat:     Lips: Pink.     Tongue: Lesions (sores on tongue) present.     Pharynx: Oropharyngeal exudate and posterior oropharyngeal erythema present.     Tonsils: Tonsillar exudate present. 2+ on the right. 2+ on the left.  Eyes:     General: Vision grossly intact. Gaze aligned appropriately.        Right eye: Discharge present.        Left eye: Discharge present.    Conjunctiva/sclera:     Right eye: Right conjunctiva is injected.     Left eye: Left conjunctiva is injected.  Neck:     Trachea: Trachea and phonation normal.  Pulmonary:     Effort: Pulmonary effort is normal. No accessory muscle usage or respiratory distress.     Breath  sounds: Normal breath sounds and air entry. No decreased air movement.  Lymphadenopathy:     Cervical: Cervical adenopathy present.     Right cervical: No superficial cervical adenopathy.    Left cervical: Superficial cervical adenopathy present.  Neurological:     Mental Status: She is alert.       Assessment/Plan: 1. Acute non-recurrent sphenoidal sinusitis Empiric antibiotic tx prescribed.  - doxycycline (VIBRA-TABS) 100 MG tablet; Take 1 tablet (  100 mg total) by mouth 2 (two) times daily for 10 days. Take with food  Dispense: 20 tablet; Refill: 0  2. Acute bacterial conjunctivitis of both eyes Antibiotic eye drops prescribed.  - ofloxacin (OCUFLOX) 0.3 % ophthalmic solution; Place 1 drop into both eyes 4 (four) times daily for 5 days.  Dispense: 5 mL; Refill: 0  3. Sore throat Negative for strep - POCT rapid strep A  4. Mouth sores Magic mouth wash prescribed for mouth pain and to treat sores on tongue - magic mouthwash (nystatin, lidocaine, diphenhydrAMINE) suspension; Take 5 mLs by mouth 3 (three) times daily as needed for mouth pain.  Dispense: 180 mL; Refill: 0   General Counseling: Lular verbalizes understanding of the findings of todays visit and agrees with plan of treatment. I have discussed any further diagnostic evaluation that may be needed or ordered today. We also reviewed her medications today. she has been encouraged to call the office with any questions or concerns that should arise related to todays visit.    Counseling:    Orders Placed This Encounter  Procedures   POCT rapid strep A    Meds ordered this encounter  Medications   magic mouthwash (nystatin, lidocaine, diphenhydrAMINE) suspension    Sig: Take 5 mLs by mouth 3 (three) times daily as needed for mouth pain.    Dispense:  180 mL    Refill:  0    Please prepare mouthwash using 60 ml nystatin, 60 ml lidocaine, and 60 ml of generic benadryl   ofloxacin (OCUFLOX) 0.3 % ophthalmic solution     Sig: Place 1 drop into both eyes 4 (four) times daily for 5 days.    Dispense:  5 mL    Refill:  0   doxycycline (VIBRA-TABS) 100 MG tablet    Sig: Take 1 tablet (100 mg total) by mouth 2 (two) times daily for 10 days. Take with food    Dispense:  20 tablet    Refill:  0    Return if symptoms worsen or fail to improve.  [place] Controlled Substance Database was reviewed by me for overdose risk score (ORS)  Time spent:30 Minutes Time spent with patient included reviewing progress notes, labs, imaging studies, and discussing plan for follow up.   This patient was seen by [name], FNP-C in collaboration with Dr. Clayborn Bigness as a part of collaborative care agreement.  Keron Koffman R. Valetta Fuller, MSN, FNP-C Internal Medicine

## 2022-11-24 NOTE — Telephone Encounter (Signed)
Sent nystatin instead

## 2022-11-26 ENCOUNTER — Ambulatory Visit (INDEPENDENT_AMBULATORY_CARE_PROVIDER_SITE_OTHER): Payer: Medicare Other | Admitting: Podiatry

## 2022-11-26 ENCOUNTER — Encounter: Payer: Self-pay | Admitting: Podiatry

## 2022-11-26 VITALS — BP 128/72 | HR 66

## 2022-11-26 DIAGNOSIS — I739 Peripheral vascular disease, unspecified: Secondary | ICD-10-CM

## 2022-11-26 DIAGNOSIS — M79676 Pain in unspecified toe(s): Secondary | ICD-10-CM

## 2022-11-26 DIAGNOSIS — G6289 Other specified polyneuropathies: Secondary | ICD-10-CM | POA: Diagnosis not present

## 2022-11-26 DIAGNOSIS — E1159 Type 2 diabetes mellitus with other circulatory complications: Secondary | ICD-10-CM

## 2022-11-26 DIAGNOSIS — B351 Tinea unguium: Secondary | ICD-10-CM | POA: Diagnosis not present

## 2022-11-26 DIAGNOSIS — G629 Polyneuropathy, unspecified: Secondary | ICD-10-CM | POA: Insufficient documentation

## 2022-11-26 NOTE — Progress Notes (Signed)
This patient returns to my office for at risk foot care.  This patient requires this care by a professional since this patient will be at risk due to having  Type 2 diabetes and peripheral vascular disease.  .  This patient is unable to cut nails herself since the patient cannot reach her nails.These nails are painful walking and wearing shoes.  This patient presents for at risk foot care today.  General Appearance  Alert, conversant and in no acute stress.  Vascular  Dorsalis pedis and posterior tibial  pulses are barely  palpable  bilaterally.  Capillary return is within normal limits  bilaterally. Temperature is within normal limits  bilaterally.  Neurologic  Senn-Weinstein monofilament wire test diminished  bilaterally. Muscle power within normal limits bilaterally.  Nails Thick disfigured discolored nails with subungual debris  from hallux to fifth toes bilaterally. No evidence of bacterial infection or drainage bilaterally.  Orthopedic  No limitations of motion  feet .  No crepitus or effusions noted.  No bony pathology or digital deformities noted.  Pes planus and HAV  B/L.  DJD STJ left foot.  Skin  normotropic skin with no porokeratosis noted bilaterally.  No signs of infections or ulcers noted.   Pinch callus  left foot  Thin shiny skin  Onychomycosis  Pain in right toes  Pain in left toes    Consent was obtained for treatment procedures.   Mechanical debridement of nails 1-5  bilaterally performed with a nail nipper.  Filed with dremel without incident.     Return office visit    3 months                  Told patient to return for periodic foot care and evaluation due to potential at risk complications.   Gardiner Barefoot DPM

## 2022-11-30 ENCOUNTER — Other Ambulatory Visit: Payer: Self-pay | Admitting: Nurse Practitioner

## 2022-11-30 DIAGNOSIS — E1165 Type 2 diabetes mellitus with hyperglycemia: Secondary | ICD-10-CM

## 2022-11-30 DIAGNOSIS — R600 Localized edema: Secondary | ICD-10-CM

## 2022-11-30 DIAGNOSIS — I10 Essential (primary) hypertension: Secondary | ICD-10-CM

## 2022-12-02 ENCOUNTER — Encounter: Payer: Self-pay | Admitting: Nurse Practitioner

## 2022-12-02 ENCOUNTER — Ambulatory Visit (INDEPENDENT_AMBULATORY_CARE_PROVIDER_SITE_OTHER): Payer: 59 | Admitting: Nurse Practitioner

## 2022-12-02 VITALS — BP 105/64 | HR 70 | Temp 97.5°F | Resp 16 | Ht 63.5 in | Wt 224.0 lb

## 2022-12-02 DIAGNOSIS — Z79899 Other long term (current) drug therapy: Secondary | ICD-10-CM

## 2022-12-02 DIAGNOSIS — K140 Glossitis: Secondary | ICD-10-CM | POA: Diagnosis not present

## 2022-12-02 DIAGNOSIS — I10 Essential (primary) hypertension: Secondary | ICD-10-CM

## 2022-12-02 DIAGNOSIS — Z23 Encounter for immunization: Secondary | ICD-10-CM | POA: Diagnosis not present

## 2022-12-02 MED ORDER — ZOSTER VAC RECOMB ADJUVANTED 50 MCG/0.5ML IM SUSR: 0.5000 mL | Freq: Once | INTRAMUSCULAR | 0 refills | Status: AC

## 2022-12-02 MED ORDER — DULAGLUTIDE 3 MG/0.5ML ~~LOC~~ SOAJ: 3.0000 mg | SUBCUTANEOUS | 3 refills | Status: DC

## 2022-12-02 MED ORDER — ATORVASTATIN CALCIUM 10 MG PO TABS: ORAL_TABLET | ORAL | 3 refills | Status: DC

## 2022-12-02 MED ORDER — NYSTATIN 100000 UNIT/ML MT SUSP: 5.0000 mL | Freq: Three times a day (TID) | OROMUCOSAL | 0 refills | Status: DC | PRN

## 2022-12-02 MED ORDER — TETANUS-DIPHTH-ACELL PERTUSSIS 5-2.5-18.5 LF-MCG/0.5 IM SUSP: 0.5000 mL | Freq: Once | INTRAMUSCULAR | 0 refills | Status: AC

## 2022-12-02 MED ORDER — FLUTICASONE-SALMETEROL 250-50 MCG/ACT IN AEPB: INHALATION_SPRAY | RESPIRATORY_TRACT | 2 refills | Status: DC

## 2022-12-02 MED ORDER — OLMESARTAN-AMLODIPINE-HCTZ 40-10-25 MG PO TABS: 1.0000 | ORAL_TABLET | Freq: Every day | ORAL | 1 refills | Status: DC

## 2022-12-02 MED ORDER — EZETIMIBE 10 MG PO TABS: 10.0000 mg | ORAL_TABLET | Freq: Every day | ORAL | 1 refills | Status: DC

## 2022-12-02 MED ORDER — LEVOTHYROXINE SODIUM 50 MCG PO TABS: ORAL_TABLET | ORAL | 3 refills | Status: DC

## 2022-12-02 MED ORDER — DAPAGLIFLOZIN PROPANEDIOL 10 MG PO TABS: 10.0000 mg | ORAL_TABLET | Freq: Every day | ORAL | 1 refills | Status: DC

## 2022-12-02 MED ORDER — CILOSTAZOL 50 MG PO TABS: 50.0000 mg | ORAL_TABLET | Freq: Two times a day (BID) | ORAL | 1 refills | Status: DC

## 2022-12-02 MED ORDER — OMEPRAZOLE 20 MG PO CPDR: DELAYED_RELEASE_CAPSULE | ORAL | 2 refills | Status: DC

## 2022-12-02 MED ORDER — FEBUXOSTAT 40 MG PO TABS: 40.0000 mg | ORAL_TABLET | Freq: Every day | ORAL | 3 refills | Status: DC

## 2022-12-02 MED ORDER — FLUCONAZOLE 150 MG PO TABS: 150.0000 mg | ORAL_TABLET | Freq: Every day | ORAL | 0 refills | Status: AC

## 2022-12-02 MED ORDER — GABAPENTIN 300 MG PO CAPS: 300.0000 mg | ORAL_CAPSULE | Freq: Three times a day (TID) | ORAL | 0 refills | Status: DC

## 2022-12-02 MED ORDER — HYDROXYCHLOROQUINE SULFATE 200 MG PO TABS: 200.0000 mg | ORAL_TABLET | Freq: Two times a day (BID) | ORAL | 1 refills | Status: DC

## 2022-12-02 MED ORDER — CELECOXIB 100 MG PO CAPS: ORAL_CAPSULE | ORAL | 1 refills | Status: DC

## 2022-12-02 NOTE — Progress Notes (Signed)
[facility] [place], [place] [address]  Internal MEDICINE  Office Visit Note  Patient Name: Chelsey [name]  [id number]  [id number]  Date of Service: 12/02/2022  Chief Complaint  Patient presents with   Follow-up   Diabetes   Gastroesophageal Reflux   Hypertension    HPI Chelsey [name] presents for a follow-up visit for hypertension, sores on mouth and switching pharmacies.  Was not receving all [facility] Has been running out of diabetic medications and BP medications. [facility]ants to switch to [facility] BP is improved and swelling has improved some, comes and goes.  Never received the magic [facility]wash from [facility], [facility]. Still has multiple sores on her tongue and under it -- painful, antibiotic did not help.     Current Medication: Outpatient Encounter Medications as of 12/02/2022  Medication Sig   albuterol (VENTOLIN HFA) 108 (90 Base) MCG/ACT inhaler Inhale 2 puffs into the lungs every 6 (six) hours as needed for wheezing or shortness of breath.   aspirin EC 81 MG tablet Take 1 tablet (81 mg total) by mouth daily.   COMFORT EZ PEN NEEDLES 31G X 8 MM MISC USE THREE TIMES DAILY   doxycycline (VIBRA-TABS) 100 MG tablet Take 1 tablet (100 mg total) by mouth 2 (two) times daily for 10 days. Take with food   glucose blood test strip Test sugar 4 x aday for uncontrolled dm on insulin E11.22   HUMULIN 70/30 KWIKPEN (70-30) 100 UNIT/ML KwikPen INJECT 25 UNITS into THE SKIN TWICE DAILY   LANCETS ULTRA THIN MISC Apply 1 each topically daily.    OXYGEN Inhale 3 L into the lungs. Nighttime use   [DISCONTINUED] atorvastatin (LIPITOR) 10 MG tablet TAKE ONE TABLET BY MOUTH ONCE DAILY FOR cholesterol   [DISCONTINUED] celecoxib (CELEBREX) 100 MG capsule TAKE 1 CAPSULE(100 MG) BY MOUTH DAILY   [DISCONTINUED] cilostazol (PLETAL) 50 MG tablet TAKE ONE TABLET BY MOUTH TWICE DAILY   [DISCONTINUED]  Dulaglutide 3 MG/0.5ML SOPN Inject 3 mg into the skin once a week.   [DISCONTINUED] ezetimibe (ZETIA) 10 MG tablet TAKE ONE TABLET BY MOUTH ONCE DAILY   [DISCONTINUED] FARXIGA 10 MG TABS tablet TAKE ONE TABLET BY MOUTH ONCE DAILY   [DISCONTINUED] febuxostat (ULORIC) 40 MG tablet Take 1 tablet (40 mg total) by mouth daily.   [DISCONTINUED] fluticasone-salmeterol (ADVAIR) 250-50 MCG/ACT AEPB INHALE 1 PUFF BY MOUTH INTO LUNGS IN THE MORNING and AT BEDTIME   [DISCONTINUED] gabapentin (NEURONTIN) 300 MG capsule TAKE ONE CAPSULE BY MOUTH THREE TIMES DAILY   [DISCONTINUED] hydrochlorothiazide (HYDRODIURIL) 12.5 MG tablet Take 1 tablet (12.5 mg total) by mouth daily.   [DISCONTINUED] hydroxychloroquine (PLAQUENIL) 200 MG tablet TAKE ONE TABLET BY MOUTH TWICE DAILY   [DISCONTINUED] levothyroxine (SYNTHROID) 50 MCG tablet TAKE ONE TABLET BY MOUTH EVERY MORNING ON AN EMPTY STOMACH   [DISCONTINUED] LINZESS 145 MCG CAPS capsule TAKE 1 CAPSULE(145 MCG) BY MOUTH DAILY   [DISCONTINUED] magic mouthwash (nystatin, lidocaine, diphenhydrAMINE) suspension Take 5 mLs by mouth 3 (three) times daily as needed for mouth pain.   [DISCONTINUED] Olmesartan-amLODIPine-HCTZ 40-10-25 MG TABS TAKE ONE TABLET BY MOUTH ONCE DAILY   [DISCONTINUED] omeprazole (PRILOSEC) 20 MG capsule TAKE ONE CAPSULE BY MOUTH TWICE DAILY FOR gerd   [DISCONTINUED] Tdap (BOOSTRIX) 5-2.5-18.5 LF-MCG/0.5 injection Inject 0.5 mLs into the muscle once.   [DISCONTINUED] Zoster Vaccine Adjuvanted [facility]) injection Inject 0.5 mLs into the muscle once.   atorvastatin (LIPITOR) 10 MG tablet TAKE  ONE TABLET BY MOUTH ONCE DAILY FOR cholesterol   celecoxib (CELEBREX) 100 MG capsule TAKE 1 CAPSULE(100 MG) BY MOUTH DAILY   cilostazol (PLETAL) 50 MG tablet Take 1 tablet (50 mg total) by mouth 2 (two) times daily.   dapagliflozin propanediol (FARXIGA) 10 MG TABS tablet Take 1 tablet (10 mg total) by mouth daily.   Dulaglutide 3 MG/0.5ML SOPN Inject 3 mg into the  skin once a week.   ezetimibe (ZETIA) 10 MG tablet Take 1 tablet (10 mg total) by mouth daily.   febuxostat (ULORIC) 40 MG tablet Take 1 tablet (40 mg total) by mouth daily.   fluticasone-salmeterol (ADVAIR) 250-50 MCG/ACT AEPB INHALE 1 PUFF BY MOUTH INTO LUNGS IN THE MORNING and AT BEDTIME   gabapentin (NEURONTIN) 300 MG capsule Take 1 capsule (300 mg total) by mouth 3 (three) times daily.   hydroxychloroquine (PLAQUENIL) 200 MG tablet Take 1 tablet (200 mg total) by mouth 2 (two) times daily.   levothyroxine (SYNTHROID) 50 MCG tablet TAKE ONE TABLET BY MOUTH EVERY MORNING ON AN EMPTY STOMACH   magic mouthwash (nystatin, lidocaine, diphenhydrAMINE) suspension Take 5 mLs by mouth 3 (three) times daily as needed for mouth pain.   Olmesartan-amLODIPine-HCTZ 40-10-25 MG TABS Take 1 tablet by mouth daily.   omeprazole (PRILOSEC) 20 MG capsule TAKE ONE CAPSULE BY MOUTH TWICE DAILY FOR gerd   Tdap (BOOSTRIX) 5-2.5-18.5 LF-MCG/0.5 injection Inject 0.5 mLs into the muscle once for 1 dose.   Zoster Vaccine Adjuvanted [facility]) injection Inject 0.5 mLs into the muscle once for 1 dose.   [DISCONTINUED] atorvastatin (LIPITOR) 10 MG tablet TAKE 1 TABLET BY MOUTH DAILY FOR CHOLESTEROL   [DISCONTINUED] COMFORT EZ PEN NEEDLES 31G X 8 MM MISC USE THREE TIMES DAILY   [DISCONTINUED] ezetimibe (ZETIA) 10 MG tablet Take 1 tablet (10 mg total) by mouth daily.   [DISCONTINUED] gabapentin (NEURONTIN) 300 MG capsule TAKE ONE CAPSULE BY MOUTH THREE TIMES DAILY   [DISCONTINUED] levothyroxine (SYNTHROID) 50 MCG tablet Take 1 tablet in the morning on a empty stomach   [DISCONTINUED] Olmesartan-amLODIPine-HCTZ 40-10-25 MG TABS Take 1 tablet by mouth daily.   No facility-administered encounter medications on file as of 12/02/2022.    Surgical History: Past Surgical History:  Procedure Laterality Date   BRAIN SURGERY  1990's   CATARACT EXTRACTION W/PHACO Left 06/11/2016   Procedure: CATARACT EXTRACTION PHACO AND  INTRAOCULAR LENS PLACEMENT (IOC);  Surgeon: Birder Robson, MD;  Location: [facility] ORS;  Service: Ophthalmology;  Laterality: Left;  [place] 1.01 AP% 21.6 CDE 13.23 Fluid pack lot # [id number]   CEREBRAL ANEURYSM REPAIR  1990's   COILS   CORONARY ARTERY BYPASS GRAFT     EYE SURGERY  2000's   bilateral cataract   FRACTURE SURGERY     HERNIA REPAIR  2000   ventral   STENT PLACEMENT VASCULAR ([place] HX)     VEIN BYPASS SURGERY     VENTRAL HERNIA REPAIR N/A 04/29/2016   Procedure: Laparoscopic HERNIA REPAIR VENTRAL ADULT;  Surgeon: Jules Husbands, MD;  Location: [facility] ORS;  Service: General;  Laterality: N/A;    Medical History: Past Medical History:  Diagnosis Date   Anemia    Aneurysm ([place])    Arthritis    RHEUMATOID ARTHRITIS   Asthma    Collagen vascular disease ([place])    COPD (chronic obstructive pulmonary disease) ([place])    Coronary artery disease    Dementia ([place])    Diabetes mellitus without complication ([place])    Edema  FEET/LEGS   GERD (gastroesophageal reflux disease)    Gout    H/O wheezing    History of hiatal hernia    Hypertension    Hypothyroidism    Neuropathy    Oxygen deficiency    HS   Peripheral vascular disease (HCC)    Sarcoidosis of lung (HCC)    Seizures (HCC)    WITH BRAIN 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    Sleep apnea    OXYGEN AT NIGHT 3 L [place]    Family History: Family History  Problem Relation Age of Onset   Heart disease Mother    Hypertension Mother    Diabetes Mother    Diabetes Father    Heart disease Father    Hypertension Father    Breast cancer Maternal Aunt     Social History   Socioeconomic History   Marital status: Single    Spouse name: Not on file   Number of children: Not on file   Years of education: Not on file   Highest education level: Not on file  Occupational History   Not on file  Tobacco Use   Smoking status: Former    Packs/day: 1.00    Years: 30.00    Total pack years: 30.00    Types: Cigarettes    Quit date:  01/27/2006    Years since quitting: 16.8   Smokeless tobacco: Never   Tobacco comments:    quit   Substance and Sexual Activity   Alcohol use: No    Alcohol/week: 0.0 standard drinks of alcohol   Drug use: No   Sexual activity: Not on file  Other Topics Concern   Not on file  Social History Narrative   Not on file   Social Determinants of Health   Financial Resource Strain: Not on file  Food Insecurity: Not on file  Transportation Needs: Not on file  Physical Activity: Not on file  Stress: Not on file  Social Connections: Not on file  Intimate Partner Violence: Not on file      Review of Systems  Constitutional:  Positive for fatigue.  HENT:  Positive for congestion and mouth sores.   Respiratory: Negative.  Negative for cough, chest tightness, shortness of breath and wheezing.   Cardiovascular:  Negative for chest pain and palpitations.  Gastrointestinal: Negative.   Musculoskeletal: Negative.   Neurological: Negative.     Vital Signs: BP 105/64   Pulse 70   Temp (!) 97.5 F (36.4 C)   Resp 16   Ht 5' 3.5" (1.613 m)   Wt 224 lb (101.6 kg)   SpO2 94%   BMI 39.06 kg/m    Physical Exam Vitals reviewed.  Constitutional:      General: She is not in acute distress.    Appearance: Normal appearance. She is obese. She is not ill-appearing.  HENT:     Head: Normocephalic and atraumatic.     Mouth/Throat:     Lips: Pink.     Mouth: Mucous membranes are dry. Oral lesions present.     Tongue: Lesions present.  Eyes:     Pupils: Pupils are equal, round, and reactive to light.  Cardiovascular:     Rate and Rhythm: Normal rate and regular rhythm.  Pulmonary:     Effort: Pulmonary effort is normal. No respiratory distress.  Neurological:     Mental Status: She is alert and oriented to person, place, and time.  Psychiatric:        Mood and Affect:  Mood normal.        Behavior: Behavior normal.        Assessment/Plan: 1. Essential hypertension Stable and  well controlled, continue medication as prescribed.  - Olmesartan-amLODIPine-HCTZ 40-10-25 MG TABS; Take 1 tablet by mouth daily.  Dispense: 90 tablet; Refill: 1  2. Glossitis 5 days of fluconazole prescribed as well as [facility] it.  - magic mouthwash (nystatin, lidocaine, diphenhydrAMINE) suspension; Take 5 mLs by mouth 3 (three) times daily as needed for mouth pain.  Dispense: 180 mL; Refill: 0 - fluconazole (DIFLUCAN) 150 MG tablet; Take 1 tablet (150 mg total) by mouth daily for 5 doses.  Dispense: 5 tablet; Refill: 0  3. Encounter for medication review Review medications and resent prescriptions to [facility]. Instructed [facility] to let them know that she has switched pharmacies and to cancel her next delivery. Also instructed the patient to call [facility] to touch base with them and update her information.  - atorvastatin (LIPITOR) 10 MG tablet; TAKE ONE TABLET BY MOUTH ONCE DAILY FOR cholesterol  Dispense: 90 tablet; Refill: 3 - celecoxib (CELEBREX) 100 MG capsule; TAKE 1 CAPSULE(100 MG) BY MOUTH DAILY  Dispense: 90 capsule; Refill: 1 - cilostazol (PLETAL) 50 MG tablet; Take 1 tablet (50 mg total) by mouth 2 (two) times daily.  Dispense: 180 tablet; Refill: 1 - Dulaglutide 3 MG/0.5ML SOPN; Inject 3 mg into the skin once a week.  Dispense: 2 mL; Refill: 3 - ezetimibe (ZETIA) 10 MG tablet; Take 1 tablet (10 mg total) by mouth daily.  Dispense: 90 tablet; Refill: 1 - dapagliflozin propanediol (FARXIGA) 10 MG TABS tablet; Take 1 tablet (10 mg total) by mouth daily.  Dispense: 90 tablet; Refill: 1 - febuxostat (ULORIC) 40 MG tablet; Take 1 tablet (40 mg total) by mouth daily.  Dispense: 90 tablet; Refill: 3 - fluticasone-salmeterol (ADVAIR) 250-50 MCG/ACT AEPB; INHALE 1 PUFF BY MOUTH INTO LUNGS IN THE MORNING and AT BEDTIME  Dispense: 60 each; Refill: 2 - gabapentin (NEURONTIN) 300 MG  capsule; Take 1 capsule (300 mg total) by mouth 3 (three) times daily.  Dispense: 270 capsule; Refill: 0 - levothyroxine (SYNTHROID) 50 MCG tablet; TAKE ONE TABLET BY MOUTH EVERY MORNING ON AN EMPTY STOMACH  Dispense: 90 tablet; Refill: 3 - hydroxychloroquine (PLAQUENIL) 200 MG tablet; Take 1 tablet (200 mg total) by mouth 2 (two) times daily.  Dispense: 180 tablet; Refill: 1 - omeprazole (PRILOSEC) 20 MG capsule; TAKE ONE CAPSULE BY MOUTH TWICE DAILY FOR gerd  Dispense: 180 capsule; Refill: 2  4. Need for vaccination [facility] - Zoster Vaccine Adjuvanted [facility]) injection; Inject 0.5 mLs into the muscle once for 1 dose.  Dispense: 0.5 mL; Refill: 0 - Tdap (BOOSTRIX) 5-2.5-18.5 LF-MCG/0.5 injection; Inject 0.5 mLs into the muscle once for 1 dose.  Dispense: 0.5 mL; Refill: 0   General Counseling: Adelaida verbalizes understanding of the findings of todays visit and agrees with plan of treatment. I have discussed any further diagnostic evaluation that may be needed or ordered today. We also reviewed her medications today. she has been encouraged to call the office with any questions or concerns that should arise related to todays visit.    No orders of the defined types were placed in this encounter.   Meds ordered this encounter  Medications   magic mouthwash (nystatin, lidocaine, diphenhydrAMINE) suspension    Sig: Take 5 mLs by mouth 3 (  three) times daily as needed for mouth pain.    Dispense:  180 mL    Refill:  0    Please prepare mouthwash using 60 ml nystatin, 60 ml lidocaine, and 60 ml of generic benadryl   atorvastatin (LIPITOR) 10 MG tablet    Sig: TAKE ONE TABLET BY MOUTH ONCE DAILY FOR cholesterol    Dispense:  90 tablet    Refill:  3   celecoxib (CELEBREX) 100 MG capsule    Sig: TAKE 1 CAPSULE(100 MG) BY MOUTH DAILY    Dispense:  90 capsule    Refill:  1   cilostazol (PLETAL) 50 MG tablet    Sig: Take 1 tablet (50 mg total) by mouth 2 (two) times daily.     Dispense:  180 tablet    Refill:  1   Dulaglutide 3 MG/0.5ML SOPN    Sig: Inject 3 mg into the skin once a week.    Dispense:  2 mL    Refill:  3    Patient sees endocrinology, was started on 1.5 mg dose, endo wants her increased after 1 month, please discontinue 1.5 dose, and start this dose with next fill   ezetimibe (ZETIA) 10 MG tablet    Sig: Take 1 tablet (10 mg total) by mouth daily.    Dispense:  90 tablet    Refill:  1   dapagliflozin propanediol (FARXIGA) 10 MG TABS tablet    Sig: Take 1 tablet (10 mg total) by mouth daily.    Dispense:  90 tablet    Refill:  1   febuxostat (ULORIC) 40 MG tablet    Sig: Take 1 tablet (40 mg total) by mouth daily.    Dispense:  90 tablet    Refill:  3   fluticasone-salmeterol (ADVAIR) 250-50 MCG/ACT AEPB    Sig: INHALE 1 PUFF BY MOUTH INTO LUNGS IN THE MORNING and AT BEDTIME    Dispense:  60 each    Refill:  2    This prescription was filled on 05/06/2022. Any refills authorized will be placed on file.   gabapentin (NEURONTIN) 300 MG capsule    Sig: Take 1 capsule (300 mg total) by mouth 3 (three) times daily.    Dispense:  270 capsule    Refill:  0   levothyroxine (SYNTHROID) 50 MCG tablet    Sig: TAKE ONE TABLET BY MOUTH EVERY MORNING ON AN EMPTY STOMACH    Dispense:  90 tablet    Refill:  3   hydroxychloroquine (PLAQUENIL) 200 MG tablet    Sig: Take 1 tablet (200 mg total) by mouth 2 (two) times daily.    Dispense:  180 tablet    Refill:  1   Olmesartan-amLODIPine-HCTZ 40-10-25 MG TABS    Sig: Take 1 tablet by mouth daily.    Dispense:  90 tablet    Refill:  1   omeprazole (PRILOSEC) 20 MG capsule    Sig: TAKE ONE CAPSULE BY MOUTH TWICE DAILY FOR gerd    Dispense:  180 capsule    Refill:  2    This prescription was filled on 07/06/2022. Any refills authorized will be placed on file.   Zoster Vaccine Adjuvanted [facility]) injection    Sig: Inject 0.5 mLs into the muscle once for 1 dose.    Dispense:  0.5 mL    Refill:  0    Tdap (BOOSTRIX) 5-2.5-18.5 LF-MCG/0.5 injection    Sig: Inject 0.5 mLs into the muscle once for 1 dose.  Dispense:  0.5 mL    Refill:  0    Return in about 3 months (around 03/03/2023) for F/U, Tasmin Exantus PCP, BP check.   Total time spent:30 Minutes Time spent includes review of chart, medications, test results, and follow up plan with the patient.   [place] Controlled Substance Database was reviewed by me.  This patient was seen by [name], FNP-C in collaboration with Dr. Clayborn Bigness as a part of collaborative care agreement.   Natesha Hassey R. Valetta Fuller, MSN, FNP-C Internal medicine

## 2022-12-03 ENCOUNTER — Telehealth: Payer: Self-pay | Admitting: Nurse Practitioner

## 2022-12-03 MED ORDER — NYSTATIN 100000 UNIT/ML MT SUSP: 5.0000 mL | Freq: Four times a day (QID) | OROMUCOSAL | 0 refills | Status: AC

## 2022-12-03 NOTE — Telephone Encounter (Signed)
Error

## 2022-12-04 ENCOUNTER — Telehealth: Payer: Self-pay

## 2022-12-09 ENCOUNTER — Telehealth: Payer: Self-pay | Admitting: Nurse Practitioner

## 2022-12-09 MED ORDER — DULAGLUTIDE 4.5 MG/0.5ML ~~LOC~~ SOAJ: 4.5000 mg | SUBCUTANEOUS | 1 refills | Status: DC

## 2022-12-09 NOTE — Telephone Encounter (Signed)
Discussed diabetes with patient. Has not seen endocrinologist since June last year and does not plan to see them anymore. She wants PCP to take over all her diabetic medications and her last trulicity dose prescribed by endo was 4.5 mg weekly.  Dose changed to 4.5 mg weekly.

## 2022-12-10 ENCOUNTER — Telehealth: Payer: Self-pay | Admitting: Nurse Practitioner

## 2022-12-10 NOTE — Telephone Encounter (Signed)
[name] called patient.

## 2022-12-10 NOTE — Telephone Encounter (Signed)
Office notes from 10/05/22-12/02/22 faxed to [facility]; [phone]

## 2022-12-15 ENCOUNTER — Telehealth: Payer: Self-pay | Admitting: Nurse Practitioner

## 2022-12-15 ENCOUNTER — Telehealth: Payer: Self-pay

## 2022-12-15 NOTE — Telephone Encounter (Signed)
Faxed requested MR from 06/11/22-12/12/22 to [facility]; [phone]-Toni

## 2022-12-15 NOTE — Telephone Encounter (Signed)
Received call from [facility] stating the office notes they received does not mention her diabetes. I called back to [place], notified Glennon Mac, that patient has endocrinologist that monitors her diabetes-Toni

## 2022-12-17 ENCOUNTER — Telehealth: Payer: Self-pay | Admitting: Nurse Practitioner

## 2022-12-17 ENCOUNTER — Other Ambulatory Visit: Payer: Self-pay | Admitting: Nurse Practitioner

## 2022-12-17 ENCOUNTER — Other Ambulatory Visit: Payer: Self-pay

## 2022-12-17 DIAGNOSIS — K140 Glossitis: Secondary | ICD-10-CM

## 2022-12-17 DIAGNOSIS — K1379 Other lesions of oral mucosa: Secondary | ICD-10-CM

## 2022-12-17 MED ORDER — FERROUS SULFATE 325 (65 FE) MG PO TABS: 325.0000 mg | ORAL_TABLET | Freq: Every day | ORAL | 1 refills | Status: DC

## 2022-12-17 NOTE — Telephone Encounter (Signed)
Urgent Otolaryngology referral sent via [facility] to [facility]-Toni 

## 2022-12-17 NOTE — Telephone Encounter (Signed)
[facility] called need refills for ferrous sulphate 325 as per alyssa gave verbal order for 90 with 1 refills

## 2022-12-17 NOTE — Telephone Encounter (Signed)
[name] message that alyssa order  ENT referral

## 2022-12-18 ENCOUNTER — Telehealth: Payer: Self-pay | Admitting: Nurse Practitioner

## 2022-12-18 NOTE — Telephone Encounter (Signed)
Otolaryngology appointment>> 01/07/23 with  [facility]-Toni

## 2023-01-27 ENCOUNTER — Other Ambulatory Visit: Payer: Self-pay

## 2023-01-27 DIAGNOSIS — Z79899 Other long term (current) drug therapy: Secondary | ICD-10-CM

## 2023-01-27 MED ORDER — FERROUS SULFATE 325 (65 FE) MG PO TABS: 325.0000 mg | ORAL_TABLET | Freq: Every day | ORAL | 1 refills | Status: DC

## 2023-01-28 ENCOUNTER — Other Ambulatory Visit: Payer: Self-pay

## 2023-01-28 MED ORDER — FERROUS SULFATE 325 (65 FE) MG PO TABS: 325.0000 mg | ORAL_TABLET | Freq: Every day | ORAL | 1 refills | Status: DC

## 2023-02-17 ENCOUNTER — Telehealth: Payer: Self-pay | Admitting: Nurse Practitioner

## 2023-02-17 NOTE — Telephone Encounter (Signed)
Patient called stated when she went to see Dr. Richardson Landry with [facility], they referred her to [facility], but she has not heard from them to schedule appointment. I told her she will need to call their office since they referred her-Toni

## 2023-02-20 ENCOUNTER — Emergency Department
Admission: EM | Admit: 2023-02-20 | Discharge: 2023-02-20 | Disposition: A | Discharge status: Home / Self Care | Payer: 59 | Attending: Emergency Medicine | Admitting: Emergency Medicine

## 2023-02-20 ENCOUNTER — Other Ambulatory Visit: Payer: Self-pay

## 2023-02-20 DIAGNOSIS — E119 Type 2 diabetes mellitus without complications: Secondary | ICD-10-CM | POA: Insufficient documentation

## 2023-02-20 DIAGNOSIS — M25562 Pain in left knee: Secondary | ICD-10-CM | POA: Diagnosis present

## 2023-02-20 LAB — COMPREHENSIVE METABOLIC PANEL
ALT: 21 U/L (ref 0–44)
AST: 25 U/L (ref 15–41)
Albumin: 3.9 g/dL (ref 3.5–5.0)
Alkaline Phosphatase: 138 U/L — ABNORMAL HIGH (ref 38–126)
Anion gap: 9 (ref 5–15)
BUN: 40 mg/dL — ABNORMAL HIGH (ref 8–23)
CO2: 21 mmol/L — ABNORMAL LOW (ref 22–32)
Calcium: 8.7 mg/dL — ABNORMAL LOW (ref 8.9–10.3)
Chloride: 110 mmol/L (ref 98–111)
Creatinine, Ser: 1.62 mg/dL — ABNORMAL HIGH (ref 0.44–1.00)
GFR, Estimated: 34 mL/min — ABNORMAL LOW (ref >60)
Glucose, Bld: 270 mg/dL — ABNORMAL HIGH (ref 70–99)
Potassium: 4.8 mmol/L (ref 3.5–5.1)
Sodium: 140 mmol/L (ref 135–145)
Total Bilirubin: 0.6 mg/dL (ref 0.3–1.2)
Total Protein: 7.2 g/dL (ref 6.5–8.1)

## 2023-02-20 LAB — CBC WITH DIFFERENTIAL/PLATELET
Abs Immature Granulocytes: 0.02 10*3/uL (ref 0.00–0.07)
Basophils Absolute: 0 10*3/uL (ref 0.0–0.1)
Basophils Relative: 1 %
Eosinophils Absolute: 0.3 10*3/uL (ref 0.0–0.5)
Eosinophils Relative: 6 %
HCT: 42.2 % (ref 36.0–46.0)
Hemoglobin: 13.4 g/dL (ref 12.0–15.0)
Immature Granulocytes: 1 %
Lymphocytes Relative: 29 %
Lymphs Abs: 1.2 10*3/uL (ref 0.7–4.0)
MCH: 28.8 pg (ref 26.0–34.0)
MCHC: 31.8 g/dL (ref 30.0–36.0)
MCV: 90.8 fL (ref 80.0–100.0)
Monocytes Absolute: 0.4 10*3/uL (ref 0.1–1.0)
Monocytes Relative: 10 %
Neutro Abs: 2.3 10*3/uL (ref 1.7–7.7)
Neutrophils Relative %: 53 %
Platelets: 195 10*3/uL (ref 150–400)
RBC: 4.65 MIL/uL (ref 3.87–5.11)
RDW: 13.1 % (ref 11.5–15.5)
WBC: 4.3 10*3/uL (ref 4.0–10.5)
nRBC: 0 % (ref 0.0–0.2)

## 2023-02-20 LAB — CBG MONITORING, ED: Glucose-Capillary: 253 mg/dL — ABNORMAL HIGH (ref 70–99)

## 2023-02-20 MED ORDER — MELOXICAM 15 MG PO TABS: 15.0000 mg | ORAL_TABLET | Freq: Every day | ORAL | 0 refills | Status: AC

## 2023-02-20 MED ORDER — MELOXICAM 7.5 MG PO TABS
15.0000 mg | ORAL_TABLET | Freq: Once | ORAL | Status: AC
  Administered 2023-02-20: 15 mg via ORAL
  Filled 2023-02-20: qty 2

## 2023-02-20 NOTE — ED Provider Notes (Signed)
[facility] Provider Note   Event Date/Time   First MD Initiated Contact with Patient 02/20/23 1705     (approximate) History  Leg Pain  HPI Chelsey [name] is a 72 y.o. female with a stated past medical history of type 2 diabetes as well as status post grafting to the left lower extremity secondary to peripheral vascular disease who presents complaining of pain to the medial aspect of the left knee at the end of her graft at surgical site.  Patient states that this is worse when bending the knee or walking on it.  Patient also states that she is getting significant edema to bilateral lower extremities when she is standing upright or walking. ROS: Patient currently denies any vision changes, tinnitus, difficulty speaking, facial droop, sore throat, chest pain, shortness of breath, abdominal pain, nausea/vomiting/diarrhea, dysuria, or weakness/numbness/paresthesias in any extremity   Physical Exam  Triage Vital Signs: ED Triage Vitals  Enc Vitals Group     BP 02/20/23 1636 (!) 132/92     Pulse Rate 02/20/23 1636 74     Resp 02/20/23 1636 16     Temp 02/20/23 1636 98.2 F (36.8 C)     Temp Source 02/20/23 1636 Oral     SpO2 02/20/23 1636 93 %     Weight 02/20/23 1633 225 lb (102.1 kg)     Height 02/20/23 1633 5\' 4"  (1.626 m)     Head Circumference --      Peak Flow --      Pain Score 02/20/23 1636 8     Pain Loc --      Pain Edu? --      Excl. in GC? --    Most recent vital signs: Vitals:   02/20/23 1636  BP: (!) 132/92  Pulse: 74  Resp: 16  Temp: 98.2 F (36.8 C)  SpO2: 93%   General: Awake, oriented x4. CV:  Good peripheral perfusion.  Resp:  Normal effort.  Abd:  No distention.  Other:  Elderly obese African-American female laying in bed in no acute distress.  Doppler ultrasound shows positive color flow in dorsalis pedis and posterior tibial arteries.  There is mild tenderness to palpation to the medial aspect of the left knee joint ED  Results / Procedures / Treatments  Labs (all labs ordered are listed, but only abnormal results are displayed) Labs Reviewed  COMPREHENSIVE METABOLIC PANEL - Abnormal; Notable for the following components:      Result Value   CO2 21 (*)    Glucose, Bld 270 (*)    BUN 40 (*)    Creatinine, Ser 1.62 (*)    Calcium 8.7 (*)    Alkaline Phosphatase 138 (*)    GFR, Estimated 34 (*)    All other components within normal limits  CBG MONITORING, ED - Abnormal; Notable for the following components:   Glucose-Capillary 253 (*)    All other components within normal limits  CBC WITH DIFFERENTIAL/PLATELET  PROCEDURES: Critical Care performed: No Procedures MEDICATIONS ORDERED IN ED: Medications  meloxicam (MOBIC) tablet 15 mg (has no administration in time range)   IMPRESSION / MDM / ASSESSMENT AND PLAN / ED COURSE  I reviewed the triage vital signs and the nursing notes.                             Patient's presentation is most consistent with acute presentation with potential threat to life  or bodily function. 72 year old female with a stated past medical history as listed above who presents for left medial knee pain with no history of trauma Given history, exam and workup I have low suspicion for fracture, dislocation, significant ligamentous injury, septic arthritis, gout flare, new autoimmune arthropathy, or gonococcal arthropathy.  Interventions: Bedside ultrasound shows positive color flow in the dorsalis pedis and posterior tibial arteries of the left foot.  Patient is tender to palpation and the medial aspect of the left knee joint.  I have greater concern that patient may be having worsening arthritis of the left knee which she states she does have a history of. Disposition: Discharge home with strict return precautions and instructions for prompt primary care follow up in the next week.   FINAL CLINICAL IMPRESSION(S) / ED DIAGNOSES   Final diagnoses:  Medial knee pain, left    Rx / DC Orders   ED Discharge Orders          Ordered    meloxicam (MOBIC) 15 MG tablet  Daily        02/20/23 1813           Note:  This document was prepared using Dragon voice recognition software and may include unintentional dictation errors.   Merwyn Katos, MD 02/20/23 [phone]

## 2023-02-20 NOTE — ED Triage Notes (Addendum)
Pt states she had a bypass in the left leg years ago and pt c/o pain in the left leg since last night. Pt reports swelling in the calf. No heat, redness, or tenderness noted. Pt is AOX4. Pt also c/o irregular blood sugar and right pain from sinus infection.   BGL 253

## 2023-02-20 NOTE — ED Notes (Signed)
Patient declined discharge vital signs. 

## 2023-02-23 ENCOUNTER — Other Ambulatory Visit: Payer: Self-pay

## 2023-02-23 ENCOUNTER — Telehealth: Payer: Self-pay

## 2023-02-23 MED ORDER — HUMULIN 70/30 KWIKPEN (70-30) 100 UNIT/ML ~~LOC~~ SUPN: PEN_INJECTOR | SUBCUTANEOUS | 1 refills | Status: DC

## 2023-02-23 MED ORDER — COMFORT EZ PEN NEEDLES 31G X 8 MM MISC: 3 refills | Status: AC

## 2023-02-23 NOTE — Telephone Encounter (Signed)
Verbal order to [facility].

## 2023-02-24 DIAGNOSIS — M1712 Unilateral primary osteoarthritis, left knee: Secondary | ICD-10-CM | POA: Insufficient documentation

## 2023-02-25 ENCOUNTER — Ambulatory Visit (INDEPENDENT_AMBULATORY_CARE_PROVIDER_SITE_OTHER): Payer: 59 | Admitting: Podiatry

## 2023-02-25 ENCOUNTER — Encounter: Payer: Self-pay | Admitting: Podiatry

## 2023-02-25 DIAGNOSIS — M79676 Pain in unspecified toe(s): Secondary | ICD-10-CM | POA: Diagnosis not present

## 2023-02-25 DIAGNOSIS — E1159 Type 2 diabetes mellitus with other circulatory complications: Secondary | ICD-10-CM

## 2023-02-25 DIAGNOSIS — I739 Peripheral vascular disease, unspecified: Secondary | ICD-10-CM

## 2023-02-25 DIAGNOSIS — B351 Tinea unguium: Secondary | ICD-10-CM | POA: Diagnosis not present

## 2023-02-25 NOTE — Progress Notes (Signed)
This patient returns to my office for at risk foot care.  This patient requires this care by a professional since this patient will be at risk due to having  Type 2 diabetes and peripheral vascular disease.  .  This patient is unable to cut nails herself since the patient cannot reach her nails.These nails are painful walking and wearing shoes.  This patient presents for at risk foot care today. ° °General Appearance  Alert, conversant and in no acute stress. ° °Vascular  Dorsalis pedis and posterior tibial  pulses are barely  palpable  bilaterally.  Capillary return is within normal limits  bilaterally. Temperature is within normal limits  bilaterally. ° °Neurologic  Senn-Weinstein monofilament wire test diminished  bilaterally. Muscle power within normal limits bilaterally. ° °Nails Thick disfigured discolored nails with subungual debris  from hallux to fifth toes bilaterally. No evidence of bacterial infection or drainage bilaterally. ° °Orthopedic  No limitations of motion  feet .  No crepitus or effusions noted.  No bony pathology or digital deformities noted.  Pes planus and HAV  B/L.  DJD STJ left foot. ° °Skin  normotropic skin with no porokeratosis noted bilaterally.  No signs of infections or ulcers noted.   Pinch callus  left foot  Thin shiny skin ° °Onychomycosis  Pain in right toes  Pain in left toes   ° °Consent was obtained for treatment procedures.   Mechanical debridement of nails 1-5  bilaterally performed with a nail nipper.  Filed with dremel without incident.   ° ° °Return office visit    3 months                  Told patient to return for periodic foot care and evaluation due to potential at risk complications. ° ° °Rona Tomson DPM  °

## 2023-03-03 ENCOUNTER — Encounter: Payer: Self-pay | Admitting: Nurse Practitioner

## 2023-03-03 ENCOUNTER — Ambulatory Visit (INDEPENDENT_AMBULATORY_CARE_PROVIDER_SITE_OTHER): Payer: 59 | Admitting: Nurse Practitioner

## 2023-03-03 VITALS — BP 112/56 | HR 78 | Temp 97.4°F | Resp 16 | Ht 63.5 in | Wt 223.0 lb

## 2023-03-03 DIAGNOSIS — K140 Glossitis: Secondary | ICD-10-CM

## 2023-03-03 DIAGNOSIS — R39198 Other difficulties with micturition: Secondary | ICD-10-CM

## 2023-03-03 DIAGNOSIS — I10 Essential (primary) hypertension: Secondary | ICD-10-CM

## 2023-03-03 DIAGNOSIS — K1379 Other lesions of oral mucosa: Secondary | ICD-10-CM

## 2023-03-03 DIAGNOSIS — Z23 Encounter for immunization: Secondary | ICD-10-CM

## 2023-03-03 DIAGNOSIS — K219 Gastro-esophageal reflux disease without esophagitis: Secondary | ICD-10-CM

## 2023-03-03 MED ORDER — ZOSTER VAC RECOMB ADJUVANTED 50 MCG/0.5ML IM SUSR
0.5000 mL | Freq: Once | INTRAMUSCULAR | 0 refills | Status: AC
Start: 2023-03-03 — End: 2023-03-03

## 2023-03-03 MED ORDER — OMEPRAZOLE 40 MG PO CPDR
40.0000 mg | DELAYED_RELEASE_CAPSULE | Freq: Every day | ORAL | 5 refills | Status: DC
Start: 2023-03-03 — End: 2023-12-16

## 2023-03-03 MED ORDER — OLMESARTAN-AMLODIPINE-HCTZ 40-10-25 MG PO TABS
1.0000 | ORAL_TABLET | Freq: Every day | ORAL | 1 refills | Status: DC
Start: 2023-03-03 — End: 2023-05-17

## 2023-03-03 NOTE — Progress Notes (Signed)
[facility] [address] [place], [place] [address]  Internal MEDICINE  Office Visit Note  Patient Name: Chelsey [name]  [id number]  [id number]  Date of Service: 03/03/2023  Chief Complaint  Patient presents with   Diabetes   Gastroesophageal Reflux   Hypertension   Follow-up    HPI [name] presents for a follow-up visit for mouth sores, heart burn, hypertension and slow urine stream Oral lesions -- ENT did not find a solution Blood pressure improved.  Heart burn teakes omeprazole 20 mg twice daily  Slow urine stream -- does not improve with trying to force urine out, drinking plenty of water.     Current Medication: Outpatient Encounter Medications as of 03/03/2023  Medication Sig   albuterol (VENTOLIN HFA) 108 (90 Base) MCG/ACT inhaler Inhale 2 puffs into the lungs every 6 (six) hours as needed for wheezing or shortness of breath.   aspirin EC 81 MG tablet Take 1 tablet (81 mg total) by mouth daily.   atorvastatin (LIPITOR) 10 MG tablet TAKE ONE TABLET BY MOUTH ONCE DAILY FOR cholesterol   celecoxib (CELEBREX) 100 MG capsule TAKE 1 CAPSULE(100 MG) BY MOUTH DAILY   cilostazol (PLETAL) 50 MG tablet Take 1 tablet (50 mg total) by mouth 2 (two) times daily.   dapagliflozin propanediol (FARXIGA) 10 MG TABS tablet Take 1 tablet (10 mg total) by mouth daily.   Dulaglutide 4.5 MG/0.5ML SOPN Inject 4.5 mg into the skin once a week.   ezetimibe (ZETIA) 10 MG tablet Take 1 tablet (10 mg total) by mouth daily.   febuxostat (ULORIC) 40 MG tablet Take 1 tablet (40 mg total) by mouth daily.   ferrous sulfate 325 (65 FE) MG tablet Take 1 tablet (325 mg total) by mouth daily with breakfast.   fluticasone-salmeterol (ADVAIR) 250-50 MCG/ACT AEPB INHALE 1 PUFF BY MOUTH INTO LUNGS IN THE MORNING and AT BEDTIME   gabapentin (NEURONTIN) 300 MG capsule Take 1 capsule (300 mg total) by mouth 3 (three) times daily.   glucose blood test strip Test sugar 4 x aday for uncontrolled dm on  insulin E11.22   hydroxychloroquine (PLAQUENIL) 200 MG tablet Take 1 tablet (200 mg total) by mouth 2 (two) times daily.   insulin isophane & regular human KwikPen (HUMULIN 70/30 KWIKPEN) (70-30) 100 UNIT/ML KwikPen INJECT 25 UNITS into THE SKIN TWICE DAILY   Insulin Pen Needle (COMFORT EZ PEN NEEDLES) 31G X 8 MM MISC Twice a day.   LANCETS ULTRA THIN MISC Apply 1 each topically daily.    levothyroxine (SYNTHROID) 50 MCG tablet TAKE ONE TABLET BY MOUTH EVERY MORNING ON AN EMPTY STOMACH   magic mouthwash (nystatin, lidocaine, diphenhydrAMINE) suspension Take 5 mLs by mouth 3 (three) times daily as needed for mouth pain.   meloxicam (MOBIC) 15 MG tablet Take 1 tablet (15 mg total) by mouth daily for 15 days.   omeprazole (PRILOSEC) 40 MG capsule Take 1 capsule (40 mg total) by mouth daily.   OXYGEN Inhale 3 L into the lungs. Nighttime use   [DISCONTINUED] Olmesartan-amLODIPine-HCTZ 40-10-25 MG TABS Take 1 tablet by mouth daily.   [DISCONTINUED] omeprazole (PRILOSEC) 20 MG capsule TAKE ONE CAPSULE BY MOUTH TWICE DAILY FOR gerd   [DISCONTINUED] Zoster Vaccine Adjuvanted [facility]) injection Inject 0.5 mLs into the muscle once.   Olmesartan-amLODIPine-HCTZ 40-10-25 MG TABS Take 1 tablet by mouth daily.   Zoster Vaccine Adjuvanted [facility]) injection Inject 0.5 mLs into the muscle once for 1 dose.   No facility-administered encounter medications on file  as of 03/03/2023.    Surgical History: Past Surgical History:  Procedure Laterality Date   BRAIN SURGERY  1990's   CATARACT EXTRACTION W/PHACO Left 06/11/2016   Procedure: CATARACT EXTRACTION PHACO AND INTRAOCULAR LENS PLACEMENT (IOC);  Surgeon: Galen Manila, MD;  Location: [facility] ORS;  Service: Ophthalmology;  Laterality: Left;  [place] 1.01 AP% 21.6 CDE 13.23 Fluid pack lot # [id number]   CEREBRAL ANEURYSM REPAIR  1990's   COILS   CORONARY ARTERY BYPASS GRAFT     EYE SURGERY  2000's   bilateral cataract   FRACTURE SURGERY     HERNIA REPAIR   2000   ventral   STENT PLACEMENT VASCULAR ([facility] HX)     VEIN BYPASS SURGERY     VENTRAL HERNIA REPAIR N/A 04/29/2016   Procedure: Laparoscopic HERNIA REPAIR VENTRAL ADULT;  Surgeon: Leafy Ro, MD;  Location: [facility] ORS;  Service: General;  Laterality: N/A;    Medical History: Past Medical History:  Diagnosis Date   Anemia    Aneurysm    Arthritis    RHEUMATOID ARTHRITIS   Asthma    Collagen vascular disease    COPD (chronic obstructive pulmonary disease)    Coronary artery disease    Dementia    Diabetes mellitus without complication    Edema    FEET/LEGS   GERD (gastroesophageal reflux disease)    Gout    H/O wheezing    History of hiatal hernia    Hypertension    Hypothyroidism    Neuropathy    Oxygen deficiency    HS   Peripheral vascular disease    Sarcoidosis of lung    Seizures    WITH BRAIN 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    Sleep apnea    OXYGEN AT NIGHT 3 L     Family History: Family History  Problem Relation Age of Onset   Heart disease Mother    Hypertension Mother    Diabetes Mother    Diabetes Father    Heart disease Father    Hypertension Father    Breast cancer Maternal Aunt     Social History   Socioeconomic History   Marital status: Single    Spouse name: Not on file   Number of children: Not on file   Years of education: Not on file   Highest education level: Not on file  Occupational History   Not on file  Tobacco Use   Smoking status: Former    Packs/day: 1.00    Years: 30.00    Additional pack years: 0.00    Total pack years: 30.00    Types: Cigarettes    Quit date: 01/27/2006    Years since quitting: 17.1   Smokeless tobacco: Never   Tobacco comments:    quit   Substance and Sexual Activity   Alcohol use: No    Alcohol/week: 0.0 standard drinks of alcohol   Drug use: No   Sexual activity: Not on file  Other Topics Concern   Not on file  Social History Narrative   Not on file   Social Determinants of Health    Financial Resource Strain: Not on file  Food Insecurity: Not on file  Transportation Needs: Not on file  Physical Activity: Not on file  Stress: Not on file  Social Connections: Not on file  Intimate Partner Violence: Not on file      Review of Systems  Constitutional:  Positive for fatigue.  HENT:  Positive for congestion and mouth sores.  Respiratory: Negative.  Negative for cough, chest tightness, shortness of breath and wheezing.   Cardiovascular:  Negative for chest pain and palpitations.  Gastrointestinal: Negative.   Musculoskeletal: Negative.   Neurological: Negative.     Vital Signs: BP (!) 112/56   Pulse 78   Temp (!) 97.4 F (36.3 C)   Resp 16   Ht 5' 3.5" (1.613 m)   Wt 223 lb (101.2 kg)   SpO2 94%   BMI 38.88 kg/m    Physical Exam Vitals reviewed.  Constitutional:      General: She is not in acute distress.    Appearance: Normal appearance. She is obese. She is not ill-appearing.  HENT:     Head: Normocephalic and atraumatic.     Mouth/Throat:     Lips: Pink.     Mouth: Mucous membranes are dry. Oral lesions present.     Tongue: Lesions present.  Eyes:     Pupils: Pupils are equal, round, and reactive to light.  Cardiovascular:     Rate and Rhythm: Normal rate and regular rhythm.  Pulmonary:     Effort: Pulmonary effort is normal. No respiratory distress.  Neurological:     Mental Status: She is alert and oriented to person, place, and time.  Psychiatric:        Mood and Affect: Mood normal.        Behavior: Behavior normal.        Assessment/Plan: 1. Essential hypertension Continue olmesartan-amlodipine-HCTZ as prescribed.  - Olmesartan-amLODIPine-HCTZ 40-10-25 MG TABS; Take 1 tablet by mouth daily.  Dispense: 90 tablet; Refill: 1  2. Decreased urine stream  Lab ordered to check kidney function Referred to urology - CMP14+EGFR - Ambulatory referral to Urology  3. Mouth sores Referred to infectious disease - Ambulatory  referral to Infectious Disease  4. Glossitis Referred to infectious disease - Ambulatory referral to Infectious Disease  5. Gastroesophageal reflux disease without esophagitis Stable, continue omeprazole as prescribed.  - omeprazole (PRILOSEC) 40 MG capsule; Take 1 capsule (40 mg total) by mouth daily.  Dispense: 60 capsule; Refill: 5  6. Need for vaccination - Zoster Vaccine Adjuvanted [facility]) injection; Inject 0.5 mLs into the muscle once for 1 dose.  Dispense: 0.5 mL; Refill: 0   General Counseling: Akirra verbalizes understanding of the findings of todays visit and agrees with plan of treatment. I have discussed any further diagnostic evaluation that may be needed or ordered today. We also reviewed her medications today. she has been encouraged to call the office with any questions or concerns that should arise related to todays visit.    Orders Placed This Encounter  Procedures   CMP14+EGFR   Ambulatory referral to Urology   Ambulatory referral to Infectious Disease    Meds ordered this encounter  Medications   Zoster Vaccine Adjuvanted [facility]) injection    Sig: Inject 0.5 mLs into the muscle once for 1 dose.    Dispense:  0.5 mL    Refill:  0   omeprazole (PRILOSEC) 40 MG capsule    Sig: Take 1 capsule (40 mg total) by mouth daily.    Dispense:  60 capsule    Refill:  5    Note increased dose, fill new script asap   Olmesartan-amLODIPine-HCTZ 40-10-25 MG TABS    Sig: Take 1 tablet by mouth daily.    Dispense:  90 tablet    Refill:  1    Return in about 3 months (around 06/02/2023) for F/U, Emylee Decelle PCP.  Total time spent:30 Minutes Time spent includes review of chart, medications, test results, and follow up plan with the patient.   [place] Controlled Substance Database was reviewed by me.  This patient was seen by Sallyanne Kuster, FNP-C in collaboration with Dr. Beverely Risen as a part of collaborative care agreement.   Akaylah Lalley R. Tedd Sias, MSN, FNP-C Internal  medicine

## 2023-03-05 LAB — CMP14+EGFR
ALT: 16 IU/L (ref 0–32)
AST: 20 IU/L (ref 0–40)
Albumin/Globulin Ratio: 1.5 (ref 1.2–2.2)
Albumin: 4.3 g/dL (ref 3.8–4.8)
Alkaline Phosphatase: 147 IU/L — ABNORMAL HIGH (ref 44–121)
BUN/Creatinine Ratio: 22 (ref 12–28)
BUN: 42 mg/dL — ABNORMAL HIGH (ref 8–27)
Bilirubin Total: 0.6 mg/dL (ref 0.0–1.2)
CO2: 15 mmol/L — ABNORMAL LOW (ref 20–29)
Calcium: 9.3 mg/dL (ref 8.7–10.3)
Chloride: 107 mmol/L — ABNORMAL HIGH (ref 96–106)
Creatinine, Ser: 1.94 mg/dL — ABNORMAL HIGH (ref 0.57–1.00)
Globulin, Total: 2.9 g/dL (ref 1.5–4.5)
Glucose: 242 mg/dL — ABNORMAL HIGH (ref 70–99)
Potassium: 5.1 mmol/L (ref 3.5–5.2)
Sodium: 139 mmol/L (ref 134–144)
Total Protein: 7.2 g/dL (ref 6.0–8.5)
eGFR: 27 mL/min/{1.73_m2} — ABNORMAL LOW (ref >59)

## 2023-03-09 ENCOUNTER — Telehealth: Payer: Self-pay | Admitting: Nurse Practitioner

## 2023-03-09 ENCOUNTER — Other Ambulatory Visit: Payer: Self-pay

## 2023-03-09 NOTE — Telephone Encounter (Addendum)
Patient Chelsey [name] for return call. No answer when I called her back. Chelsey [name]-Chelsey [name] Patient returned call regarding ENT referral. I explained to her when I s/w on 02/17/23, that I had given her the telephone # for [facility] since [facility] has forwarded the referral to them. Gave her telephone # [phone] to schedule her appointment-Chelsey [name]

## 2023-03-09 NOTE — Telephone Encounter (Signed)
[facility] told patient they did not receive referral from [facility]. I faxed to them; [phone]

## 2023-03-13 ENCOUNTER — Encounter: Payer: Self-pay | Admitting: Nurse Practitioner

## 2023-03-15 ENCOUNTER — Telehealth: Payer: Self-pay | Admitting: Nurse Practitioner

## 2023-03-15 NOTE — Telephone Encounter (Signed)
49 pages of MR faxed to [facility] for diabetic information; [phone]

## 2023-03-17 DIAGNOSIS — E1122 Type 2 diabetes mellitus with diabetic chronic kidney disease: Secondary | ICD-10-CM | POA: Diagnosis not present

## 2023-03-22 ENCOUNTER — Telehealth: Payer: Self-pay | Admitting: Nurse Practitioner

## 2023-03-22 ENCOUNTER — Telehealth: Payer: Self-pay

## 2023-03-22 MED ORDER — GLIPIZIDE ER 2.5 MG PO TB24: 2.5000 mg | ORAL_TABLET | Freq: Every day | ORAL | 5 refills | Status: DC

## 2023-03-22 NOTE — Telephone Encounter (Signed)
Pt advised that we add glipizide  and see how is glucose reading keep track for few days and call us back with reading and also advised her to watch diet and exercise

## 2023-03-22 NOTE — Telephone Encounter (Signed)
Received fax back from [facility] stating they cannot accept 03/03/23 note since co-signature needed by dkf. Sent her message to sign. Also received fax from them requesting CGM for [facility] 10/05/22-11/24/22. Is sent fax back stating all records we have for those dates of service including CGM was faxed to them 03/15/23-Toni

## 2023-03-23 ENCOUNTER — Ambulatory Visit: Payer: Medicare Other | Admitting: Dermatology

## 2023-03-23 DIAGNOSIS — M1712 Unilateral primary osteoarthritis, left knee: Secondary | ICD-10-CM | POA: Diagnosis not present

## 2023-03-25 ENCOUNTER — Ambulatory Visit: Payer: 59 | Admitting: Infectious Diseases

## 2023-03-25 NOTE — Progress Notes (Deleted)
NAME: Chelsey [name]  DOB: [birth year]  MRN: [id number]  Date/Time: 03/25/2023 9:29 AM  REQUESTING PROVIDER Subjective:  REASON FOR CONSULT:  ? Chelsey [name] is a 73 y.o. 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   Steroid/immune suppressants/splenectomy/Hardware Recent Procedure Surgery Injections Trauma Sick contacts Travel Antibiotic use Food- raw/exotic Animal bites Tick exposure Water sports Fishing/hunting/animal bird exposure Past Medical History:  Diagnosis Date   Anemia    Aneurysm (HCC)    Arthritis    RHEUMATOID ARTHRITIS   Asthma    Collagen vascular disease (HCC)    COPD (chronic obstructive pulmonary disease) (HCC)    Coronary artery disease    Dementia (HCC)    Diabetes mellitus without complication (HCC)    Edema    FEET/LEGS   GERD (gastroesophageal reflux disease)    Gout    H/O wheezing    History of hiatal hernia    Hypertension    Hypothyroidism    Neuropathy    Oxygen deficiency    HS   Peripheral vascular disease (HCC)    Sarcoidosis of lung (HCC)    Seizures (HCC)    WITH BRAIN 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    Sleep apnea    OXYGEN AT NIGHT 3 L [place]    Past Surgical History:  Procedure Laterality Date   BRAIN SURGERY  1990's   CATARACT EXTRACTION W/PHACO Left 06/11/2016   Procedure: CATARACT EXTRACTION PHACO AND INTRAOCULAR LENS PLACEMENT (IOC);  Surgeon: Galen Manila, MD;  Location: [facility] ORS;  Service: Ophthalmology;  Laterality: Left;  [place] 1.01 AP% 21.6 CDE 13.23 Fluid pack lot # [id number]   CEREBRAL ANEURYSM REPAIR  1990's   COILS   CORONARY ARTERY BYPASS GRAFT     EYE SURGERY  2000's   bilateral cataract   FRACTURE SURGERY     HERNIA REPAIR  2000   ventral   STENT PLACEMENT VASCULAR ([facility] HX)     VEIN BYPASS SURGERY     VENTRAL HERNIA REPAIR N/A 04/29/2016   Procedure: Laparoscopic HERNIA REPAIR VENTRAL ADULT;  Surgeon: Leafy Ro, MD;  Location: [facility] ORS;  Service: General;   Laterality: N/A;    Social History   Socioeconomic History   Marital status: Single    Spouse name: Not on file   Number of children: Not on file   Years of education: Not on file   Highest education level: Not on file  Occupational History   Not on file  Tobacco Use   Smoking status: Former    Packs/day: 1.00    Years: 30.00    Additional pack years: 0.00    Total pack years: 30.00    Types: Cigarettes    Quit date: 01/27/2006    Years since quitting: 17.1   Smokeless tobacco: Never   Tobacco comments:    quit   Substance and Sexual Activity   Alcohol use: No    Alcohol/week: 0.0 standard drinks of alcohol   Drug use: No   Sexual activity: Not on file  Other Topics Concern   Not on file  Social History Narrative   Not on file   Social Determinants of Health   Financial Resource Strain: Not on file  Food Insecurity: Not on file  Transportation Needs: Not on file  Physical Activity: Not on file  Stress: Not on file  Social Connections: Not on file  Intimate Partner Violence: Not on file    Family History  Problem  Relation Age of Onset   Heart disease Mother    Hypertension Mother    Diabetes Mother    Diabetes Father    Heart disease Father    Hypertension Father    Breast cancer Maternal Aunt    Allergies  Allergen Reactions   Oxycodone-Acetaminophen Other (See Comments)    Hallucinations     Indomethacin Other (See Comments)    ulcer    Penicillins Rash   I? Current Outpatient Medications  Medication Sig Dispense Refill   albuterol (VENTOLIN HFA) 108 (90 Base) MCG/ACT inhaler Inhale 2 puffs into the lungs every 6 (six) hours as needed for wheezing or shortness of breath. 18 g 3   aspirin EC 81 MG tablet Take 1 tablet (81 mg total) by mouth daily. 90 tablet 1   atorvastatin (LIPITOR) 10 MG tablet TAKE ONE TABLET BY MOUTH ONCE DAILY FOR cholesterol 90 tablet 3   celecoxib (CELEBREX) 100 MG capsule TAKE 1 CAPSULE(100 MG) BY MOUTH DAILY 90 capsule 1    cilostazol (PLETAL) 50 MG tablet Take 1 tablet (50 mg total) by mouth 2 (two) times daily. 180 tablet 1   dapagliflozin propanediol (FARXIGA) 10 MG TABS tablet Take 1 tablet (10 mg total) by mouth daily. 90 tablet 1   Dulaglutide 4.5 MG/0.5ML SOPN Inject 4.5 mg into the skin once a week. 6 mL 1   ezetimibe (ZETIA) 10 MG tablet Take 1 tablet (10 mg total) by mouth daily. 90 tablet 1   febuxostat (ULORIC) 40 MG tablet Take 1 tablet (40 mg total) by mouth daily. 90 tablet 3   ferrous sulfate 325 (65 FE) MG tablet Take 1 tablet (325 mg total) by mouth daily with breakfast. 90 tablet 1   fluticasone-salmeterol (ADVAIR) 250-50 MCG/ACT AEPB INHALE 1 PUFF BY MOUTH INTO LUNGS IN THE MORNING and AT BEDTIME 60 each 2   gabapentin (NEURONTIN) 300 MG capsule Take 1 capsule (300 mg total) by mouth 3 (three) times daily. 270 capsule 0   glipiZIDE (GLUCOTROL XL) 2.5 MG 24 hr tablet Take 1 tablet (2.5 mg total) by mouth daily with breakfast. 30 tablet 5   glucose blood test strip Test sugar 4 x aday for uncontrolled dm on insulin E11.22 120 each 12   hydroxychloroquine (PLAQUENIL) 200 MG tablet Take 1 tablet (200 mg total) by mouth 2 (two) times daily. 180 tablet 1   insulin isophane & regular human KwikPen (HUMULIN 70/30 KWIKPEN) (70-30) 100 UNIT/ML KwikPen INJECT 25 UNITS into THE SKIN TWICE DAILY 15 mL 1   Insulin Pen Needle (COMFORT EZ PEN NEEDLES) 31G X 8 MM MISC Twice a day. 300 each 3   LANCETS ULTRA THIN MISC Apply 1 each topically daily.      levothyroxine (SYNTHROID) 50 MCG tablet TAKE ONE TABLET BY MOUTH EVERY MORNING ON AN EMPTY STOMACH 90 tablet 3   magic mouthwash (nystatin, lidocaine, diphenhydrAMINE) suspension Take 5 mLs by mouth 3 (three) times daily as needed for mouth pain. 180 mL 0   Olmesartan-amLODIPine-HCTZ 40-10-25 MG TABS Take 1 tablet by mouth daily. 90 tablet 1   omeprazole (PRILOSEC) 40 MG capsule Take 1 capsule (40 mg total) by mouth daily. 60 capsule 5   OXYGEN Inhale 3 L into the  lungs. Nighttime use     No current facility-administered medications for this visit.     Abtx:  Anti-infectives (From admission, onward)    None       REVIEW OF SYSTEMS:  Const: negative fever, negative chills, negative  weight loss Eyes: negative diplopia or visual changes, negative eye pain ENT: negative coryza, negative sore throat Resp: negative cough, hemoptysis, dyspnea Cards: negative for chest pain, palpitations, lower extremity edema GU: negative for frequency, dysuria and hematuria GI: Negative for abdominal pain, diarrhea, bleeding, constipation Skin: negative for rash and pruritus Heme: negative for easy bruising and gum/nose bleeding MS: negative for myalgias, arthralgias, back pain and muscle weakness Neurolo:negative for headaches, dizziness, vertigo, memory problems  Psych: negative for feelings of anxiety, depression  Endocrine: negative for thyroid, diabetes Allergy/Immunology- negative for any medication or food allergies ? Pertinent Positives include : Objective:  VITALS:  There were no vitals taken for this visit. LDA Foley Central line Other drainage tubes PHYSICAL EXAM:  General: Alert, cooperative, no distress, appears stated age.  Head: Normocephalic, without obvious abnormality, atraumatic. Eyes: Conjunctivae clear, anicteric sclerae. Pupils are equal ENT Nares normal. No drainage or sinus tenderness. Lips, mucosa, and tongue normal. No Thrush Neck: Supple, symmetrical, no adenopathy, thyroid: non tender no carotid bruit and no JVD. Back: No CVA tenderness. Lungs: Clear to auscultation bilaterally. No Wheezing or Rhonchi. No rales. Heart: Regular rate and rhythm, no murmur, rub or gallop. Abdomen: Soft, non-tender,not distended. Bowel sounds normal. No masses Extremities: atraumatic, no cyanosis. No edema. No clubbing Skin: No rashes or lesions. Or bruising Lymph: Cervical, supraclavicular normal. Neurologic: Grossly non-focal Pertinent  Labs Lab Results CBC    Component Value Date/Time   WBC 4.3 02/20/2023 1641   RBC 4.65 02/20/2023 1641   HGB 13.4 02/20/2023 1641   HGB 14.1 11/20/2019 0838   HCT 42.2 02/20/2023 1641   HCT 42.1 11/20/2019 0838   PLT 195 02/20/2023 1641   PLT 167 11/20/2019 0838   MCV 90.8 02/20/2023 1641   MCV 89 11/20/2019 0838   MCH 28.8 02/20/2023 1641   MCHC 31.8 02/20/2023 1641   RDW 13.1 02/20/2023 1641   RDW 12.6 11/20/2019 0838   LYMPHSABS 1.2 02/20/2023 1641   LYMPHSABS 1.0 11/20/2019 0838   MONOABS 0.4 02/20/2023 1641   EOSABS 0.3 02/20/2023 1641   EOSABS 0.2 11/20/2019 0838   BASOSABS 0.0 02/20/2023 1641   BASOSABS 0.0 11/20/2019 0838       Latest Ref Rng & Units 03/04/2023    8:56 AM 02/20/2023    4:41 PM 01/01/2022    8:07 AM  CMP  Glucose 70 - 99 mg/dL 161  096  045   BUN 8 - 27 mg/dL 42  40  17   Creatinine 0.57 - 1.00 mg/dL 4.09  8.11  9.14   Sodium 134 - 144 mmol/L 139  140  142   Potassium 3.5 - 5.2 mmol/L 5.1  4.8  4.2   Chloride 96 - 106 mmol/L 107  110  108   CO2 20 - 29 mmol/L 15  21  20    Calcium 8.7 - 10.3 mg/dL 9.3  8.7  9.2   Total Protein 6.0 - 8.5 g/dL 7.2  7.2  7.2   Total Bilirubin 0.0 - 1.2 mg/dL 0.6  0.6  0.5   Alkaline Phos 44 - 121 IU/L 147  138  157   AST 0 - 40 IU/L 20  25  24    ALT 0 - 32 IU/L 16  21  15        Microbiology: No results found for this or any previous visit (from the past 240 hour(s)).  IMAGING RESULTS: I have personally reviewed the films ? Impression/Recommendation ? ? ? ___________________________________________________ Discussed with patient,  requesting provider Note:  This document was prepared using Dragon voice recognition software and may include unintentional dictation errors.

## 2023-03-26 ENCOUNTER — Encounter: Payer: Self-pay | Admitting: Urology

## 2023-03-26 ENCOUNTER — Ambulatory Visit (INDEPENDENT_AMBULATORY_CARE_PROVIDER_SITE_OTHER): Payer: 59 | Admitting: Urology

## 2023-03-26 VITALS — BP 151/79 | HR 71 | Ht 63.0 in | Wt 210.0 lb

## 2023-03-26 DIAGNOSIS — R300 Dysuria: Secondary | ICD-10-CM | POA: Diagnosis not present

## 2023-03-26 DIAGNOSIS — R3911 Hesitancy of micturition: Secondary | ICD-10-CM

## 2023-03-26 DIAGNOSIS — R3129 Other microscopic hematuria: Secondary | ICD-10-CM | POA: Diagnosis not present

## 2023-03-26 LAB — BLADDER SCAN AMB NON-IMAGING

## 2023-03-26 LAB — URINALYSIS, COMPLETE
Bilirubin, UA: NEGATIVE
Ketones, UA: NEGATIVE
Leukocytes,UA: NEGATIVE
Nitrite, UA: NEGATIVE
Protein,UA: NEGATIVE
Specific Gravity, UA: 1.015 (ref 1.005–1.030)
Urobilinogen, Ur: 0.2 mg/dL (ref 0.2–1.0)
pH, UA: 5.5 (ref 5.0–7.5)

## 2023-03-26 LAB — MICROSCOPIC EXAMINATION

## 2023-03-26 NOTE — Patient Instructions (Signed)

## 2023-03-26 NOTE — Progress Notes (Signed)
I, Maysun L Gibbs,acting as a scribe for Riki Altes, MD.,have documented all relevant documentation on the behalf of Riki Altes, MD,as directed by  Riki Altes, MD while in the presence of Riki Altes, MD.   03/26/2023 11:29 AM   Chelsey [name] 05-Dec-1950 [id number]  Referring provider: Sallyanne Kuster, NP [address] [place],  [place] [address]  Chief Complaint  Patient presents with   New Patient (Initial Visit)   Dysuria    HPI: Chelsey [name] is a 72 y.o. female referred for decreased urinary stream.  1 month history of urinary hesitancy.  Her stream is good, no bothersome frequency or urgency.  No dysuria or flank/abdominal/pelvic pain.  Denies gross hematuria or recurrent UTI.  Denies prior urologic problems or urologic evaluation.   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    Sleep apnea    OXYGEN AT NIGHT 3 L [place]    Surgical History: Past Surgical History:  Procedure Laterality Date   BRAIN SURGERY  1990's   CATARACT EXTRACTION W/PHACO Left 06/11/2016   Procedure: CATARACT EXTRACTION PHACO AND INTRAOCULAR LENS PLACEMENT (IOC);  Surgeon: Galen Manila, MD;  Location: [facility] ORS;  Service: Ophthalmology;  Laterality: Left;  [place] 1.01 AP% 21.6 CDE 13.23 Fluid pack lot # [id number]   CEREBRAL ANEURYSM REPAIR  1990's   COILS   CORONARY ARTERY BYPASS  GRAFT     EYE SURGERY  2000's   bilateral cataract   FRACTURE SURGERY     HERNIA REPAIR  2000   ventral   STENT PLACEMENT VASCULAR ([facility] HX)     VEIN BYPASS SURGERY     VENTRAL HERNIA REPAIR N/A 04/29/2016   Procedure: Laparoscopic HERNIA REPAIR VENTRAL ADULT;  Surgeon: Leafy Ro, MD;  Location: [facility] ORS;  Service: General;  Laterality: N/A;    Home Medications:  Allergies as of 03/26/2023       Reactions   Oxycodone-acetaminophen Other (See Comments)   Hallucinations Hallucinations HALLUCINATIONS Other reaction(s): Other (See Comments) HALLUCINATIONS Other reaction(s): Other (See Comments) Hallucinations Hallucinations HALLUCINATIONS Hallucinations Other reaction(s): Other (See Comments), Other (See Comments) HALLUCINATIONS Hallucinations Hallucinations HALLUCINATIONS Other reaction(s): Other (See Comments) HALLUCINATIONS Other reaction(s): Other (See Comments) Hallucinations Hallucinations HALLUCINATIONS Hallucinations Other reaction(s): Other (See Comments) HALLUCINATIONS Other reaction(s): Other (See Comments) Hallucinations Hallucinations HALLUCINATIONS Hallucinations   Indomethacin Other (See Comments)   BURN HOLE IN STOMACH BURN HOLE IN STOMACH Other reaction(s): Other (See Comments), Other (See Comments) BURN HOLE IN STOMACH BURN HOLE IN STOMACH BURN HOLE IN STOMACH BURN HOLE IN STOMACH BURN HOLE IN STOMACH   Penicillins Rash        Medication List        Accurate as of Mar 26, 2023 11:29 AM. If you have any questions, ask your nurse or doctor.          albuterol 108 (90 Base) MCG/ACT inhaler Commonly known as: VENTOLIN HFA Inhale 2 puffs into the lungs every 6 (six) hours as needed for wheezing or shortness of breath.   aspirin EC 81 MG tablet Take 1 tablet (81 mg total) by mouth daily.   atorvastatin 10 MG tablet Commonly known as: LIPITOR TAKE ONE TABLET BY MOUTH ONCE DAILY FOR cholesterol   celecoxib 100 MG  capsule Commonly known as: CELEBREX TAKE 1 CAPSULE(100 MG) BY MOUTH DAILY   cilostazol 50 MG tablet Commonly known as: PLETAL Take 1 tablet (50 mg total) by mouth 2 (two) times daily.   Comfort EZ Pen Needles 31G X 8 MM Misc Generic drug: Insulin Pen Needle Twice a day.   dapagliflozin propanediol 10 MG Tabs tablet Commonly known as: Farxiga Take 1 tablet (10 mg total) by mouth daily.   Dulaglutide 4.5 MG/0.5ML Sopn Inject 4.5 mg into the skin once a week.   ezetimibe 10 MG tablet Commonly known as: ZETIA Take 1 tablet (10 mg total) by mouth daily.   febuxostat 40 MG tablet Commonly known as: ULORIC Take 1 tablet (40 mg total) by mouth daily.   ferrous sulfate 325 (65 FE) MG tablet Take 1 tablet (325 mg total) by mouth daily with breakfast.   fluticasone-salmeterol 250-50 MCG/ACT Aepb Commonly known as: ADVAIR INHALE 1 PUFF BY MOUTH INTO LUNGS IN THE MORNING and AT BEDTIME   gabapentin 300 MG capsule Commonly known as: NEURONTIN Take 1 capsule (300 mg total) by mouth 3 (three) times daily.   glipiZIDE 2.5 MG 24 hr tablet Commonly known as: GLUCOTROL XL Take 1 tablet (2.5 mg total) by mouth daily with breakfast.   glucose blood test strip Test sugar 4 x aday for uncontrolled dm on insulin E11.22   HumuLIN 70/30 KwikPen (70-30) 100 UNIT/ML KwikPen Generic drug: insulin isophane & regular human KwikPen INJECT 25 UNITS into THE SKIN TWICE DAILY   hydroxychloroquine 200 MG tablet Commonly known as: PLAQUENIL Take 1 tablet (200 mg total) by mouth 2 (two) times daily.   Lancets Ultra Thin Misc Apply 1 each topically daily.   levothyroxine 50 MCG tablet Commonly known as: SYNTHROID TAKE ONE TABLET BY MOUTH EVERY MORNING ON AN EMPTY STOMACH   magic mouthwash (nystatin, lidocaine, diphenhydrAMINE) suspension Take 5 mLs by mouth 3 (three) times daily as needed for mouth pain.   Olmesartan-amLODIPine-HCTZ 40-10-25 MG Tabs Take 1 tablet by mouth daily.    omeprazole 40 MG capsule Commonly known as: PRILOSEC Take 1 capsule (40 mg total) by mouth daily.   OXYGEN Inhale 3 L into the lungs. Nighttime use        Allergies:  Allergies  Allergen Reactions   Oxycodone-Acetaminophen Other (See Comments)    Hallucinations Hallucinations HALLUCINATIONS Other reaction(s): Other (See Comments) HALLUCINATIONS Other reaction(s): Other (See Comments) Hallucinations Hallucinations HALLUCINATIONS Hallucinations Other reaction(s): Other (See Comments), Other (See Comments) HALLUCINATIONS Hallucinations Hallucinations HALLUCINATIONS Other reaction(s): Other (See Comments) HALLUCINATIONS Other reaction(s): Other (See Comments) Hallucinations Hallucinations HALLUCINATIONS Hallucinations Other reaction(s): Other (See Comments) HALLUCINATIONS Other reaction(s): Other (See Comments) Hallucinations Hallucinations HALLUCINATIONS Hallucinations    Indomethacin Other (See Comments)    BURN HOLE IN STOMACH BURN HOLE IN STOMACH Other reaction(s): Other (See Comments), Other (See Comments) BURN HOLE IN STOMACH BURN HOLE IN STOMACH BURN HOLE IN STOMACH BURN HOLE IN STOMACH BURN HOLE IN STOMACH  Penicillins Rash    Family History: Family History  Problem Relation Age of Onset   Heart disease Mother    Hypertension Mother    Diabetes Mother    Diabetes Father    Heart disease Father    Hypertension Father    Breast cancer Maternal Aunt     Social History:  reports that she quit smoking about 17 years ago. Her smoking use included cigarettes. She has a 30.00 pack-year smoking history. She has been exposed to tobacco smoke. She has never used smokeless tobacco. She reports that she does not drink alcohol and does not use drugs.   Physical Exam: BP (!) 151/79   Pulse 71   Ht 5\' 3"  (1.6 m)   Wt 210 lb (95.3 kg)   BMI 37.20 kg/m   Constitutional:  Alert, No acute distress. HEENT: [place] AT Respiratory: Normal respiratory  effort, no increased work of breathing. Psychiatric: Normal mood and affect.   Urinalysis Dipstick 2+ glucose/trace blood, microscopy 3-10 RBCs.   Assessment & Plan:    1. Urinary hesitancy PVR 0 mL   2. Microhematuria.  AUA risk stratification: High We discussed the recommended evaluation of high risk hematuria which consist of CT urogram and cystoscopy.  The procedures were discussed in detail and he/she has elected to proceed with further evaluation All questions were answered CTU order placed and cystoscopy was scheduled  Pelvic exam at time of cysto.  I have reviewed the above documentation for accuracy and completeness, and I agree with the above.   Riki Altes, MD  [facility] [address], [address] [place], [place] [address] [phone]

## 2023-03-29 ENCOUNTER — Telehealth: Payer: Self-pay | Admitting: Nurse Practitioner

## 2023-03-29 NOTE — Telephone Encounter (Signed)
03/03/23 office note co-signed by dfk. Faxed to [facility]; [phone]- Sheralyn Boatman

## 2023-03-31 DIAGNOSIS — J3089 Other allergic rhinitis: Secondary | ICD-10-CM | POA: Diagnosis not present

## 2023-03-31 DIAGNOSIS — K1379 Other lesions of oral mucosa: Secondary | ICD-10-CM | POA: Diagnosis not present

## 2023-04-01 ENCOUNTER — Other Ambulatory Visit: Payer: Self-pay | Admitting: Nurse Practitioner

## 2023-04-01 DIAGNOSIS — R600 Localized edema: Secondary | ICD-10-CM

## 2023-04-01 DIAGNOSIS — I10 Essential (primary) hypertension: Secondary | ICD-10-CM

## 2023-04-05 DIAGNOSIS — M1712 Unilateral primary osteoarthritis, left knee: Secondary | ICD-10-CM | POA: Diagnosis not present

## 2023-04-07 ENCOUNTER — Other Ambulatory Visit: Payer: Self-pay | Admitting: Urology

## 2023-04-07 ENCOUNTER — Ambulatory Visit
Admission: RE | Admit: 2023-04-07 | Discharge: 2023-04-07 | Disposition: A | Discharge status: Home / Self Care | Payer: 59 | Source: Ambulatory Visit | Attending: Urology | Admitting: Urology

## 2023-04-07 DIAGNOSIS — N289 Disorder of kidney and ureter, unspecified: Secondary | ICD-10-CM | POA: Diagnosis not present

## 2023-04-07 DIAGNOSIS — R3911 Hesitancy of micturition: Secondary | ICD-10-CM | POA: Diagnosis not present

## 2023-04-07 DIAGNOSIS — R3129 Other microscopic hematuria: Secondary | ICD-10-CM

## 2023-04-07 DIAGNOSIS — N3289 Other specified disorders of bladder: Secondary | ICD-10-CM | POA: Diagnosis not present

## 2023-04-07 LAB — POCT I-STAT CREATININE: Creatinine, Ser: 1.9 mg/dL — ABNORMAL HIGH (ref 0.44–1.00)

## 2023-04-07 MED ORDER — IOHEXOL 300 MG/ML  SOLN: 125.0000 mL | Freq: Once | INTRAMUSCULAR | Status: DC | PRN

## 2023-04-09 DIAGNOSIS — E118 Type 2 diabetes mellitus with unspecified complications: Secondary | ICD-10-CM | POA: Diagnosis not present

## 2023-04-09 DIAGNOSIS — E039 Hypothyroidism, unspecified: Secondary | ICD-10-CM | POA: Diagnosis not present

## 2023-04-11 DIAGNOSIS — Z794 Long term (current) use of insulin: Secondary | ICD-10-CM | POA: Diagnosis not present

## 2023-04-11 DIAGNOSIS — E118 Type 2 diabetes mellitus with unspecified complications: Secondary | ICD-10-CM | POA: Diagnosis not present

## 2023-04-16 DIAGNOSIS — H9201 Otalgia, right ear: Secondary | ICD-10-CM | POA: Diagnosis not present

## 2023-04-16 DIAGNOSIS — H9191 Unspecified hearing loss, right ear: Secondary | ICD-10-CM | POA: Diagnosis not present

## 2023-04-16 DIAGNOSIS — H6590 Unspecified nonsuppurative otitis media, unspecified ear: Secondary | ICD-10-CM | POA: Diagnosis not present

## 2023-04-16 DIAGNOSIS — B370 Candidal stomatitis: Secondary | ICD-10-CM | POA: Diagnosis not present

## 2023-04-16 DIAGNOSIS — B3781 Candidal esophagitis: Secondary | ICD-10-CM | POA: Diagnosis not present

## 2023-04-16 DIAGNOSIS — E118 Type 2 diabetes mellitus with unspecified complications: Secondary | ICD-10-CM | POA: Diagnosis not present

## 2023-04-22 ENCOUNTER — Encounter: Payer: Self-pay | Admitting: Urology

## 2023-04-22 ENCOUNTER — Ambulatory Visit (INDEPENDENT_AMBULATORY_CARE_PROVIDER_SITE_OTHER): Payer: 59 | Admitting: Urology

## 2023-04-22 VITALS — BP 144/78 | HR 67 | Ht 63.0 in | Wt 217.0 lb

## 2023-04-22 DIAGNOSIS — R3129 Other microscopic hematuria: Secondary | ICD-10-CM | POA: Diagnosis not present

## 2023-04-22 DIAGNOSIS — N2889 Other specified disorders of kidney and ureter: Secondary | ICD-10-CM

## 2023-04-22 LAB — URINALYSIS, COMPLETE
Bilirubin, UA: NEGATIVE
Ketones, UA: NEGATIVE
Leukocytes,UA: NEGATIVE
Nitrite, UA: NEGATIVE
Protein,UA: NEGATIVE
RBC, UA: NEGATIVE
Specific Gravity, UA: 1.015 (ref 1.005–1.030)
Urobilinogen, Ur: 0.2 mg/dL (ref 0.2–1.0)
pH, UA: 5 (ref 5.0–7.5)

## 2023-04-22 LAB — MICROSCOPIC EXAMINATION: RBC, Urine: NONE SEEN /hpf (ref 0–2)

## 2023-04-22 NOTE — Progress Notes (Signed)
   04/22/23  CC:  Chief Complaint  Patient presents with   Cysto    WUJ:[id number] to my prior office note 03/26/2023.  CT urogram was not performed due to mild creatinine elevation and a noncontrast study was performed.  The CT did show a 1.9 cm right upper pole exophytic lesion with higher attenuation than a simple cyst.  This was stable from a prior study.  She also had a 1.7 cm right lateral lesion that was 7 mm on a prior scan with borderline attenuation for a simple cyst.  Urinary hesitancy has resolved  Blood pressure (!) 144/78, pulse 67, height 5\' 3"  (1.6 m), weight 217 lb (98.4 kg). NED. A&Ox3.   No respiratory distress   Abd soft, NT, ND Normal external genitalia with patent urethral meatus  Cystoscopy Procedure Note  Patient identification was confirmed, informed consent was obtained, and patient was prepped using Betadine solution.  Lidocaine jelly was administered per urethral meatus.    Procedure: - Flexible cystoscope introduced, without any difficulty.   - Thorough search of the bladder revealed:    normal urethral meatus    normal urothelium    no stones    no ulcers     no tumors    no urethral polyps    no trabeculation  - Ureteral orifices were normal in position and appearance.  Post-Procedure: - Patient tolerated the procedure well  Assessment/ Plan: No bladder mucosal lesions Probable [place] 1/2 renal cysts.  Schedule renal mass protocol MRI 6 months    Riki Altes, MD

## 2023-04-23 ENCOUNTER — Encounter: Payer: Self-pay | Admitting: Urology

## 2023-04-28 DIAGNOSIS — J449 Chronic obstructive pulmonary disease, unspecified: Secondary | ICD-10-CM | POA: Diagnosis not present

## 2023-04-28 DIAGNOSIS — E1165 Type 2 diabetes mellitus with hyperglycemia: Secondary | ICD-10-CM | POA: Diagnosis not present

## 2023-04-28 DIAGNOSIS — I10 Essential (primary) hypertension: Secondary | ICD-10-CM | POA: Diagnosis not present

## 2023-04-28 DIAGNOSIS — E079 Disorder of thyroid, unspecified: Secondary | ICD-10-CM | POA: Diagnosis not present

## 2023-04-28 DIAGNOSIS — M109 Gout, unspecified: Secondary | ICD-10-CM | POA: Diagnosis not present

## 2023-05-01 ENCOUNTER — Telehealth: Payer: Self-pay | Admitting: Nurse Practitioner

## 2023-05-03 NOTE — Telephone Encounter (Signed)
Error

## 2023-05-04 ENCOUNTER — Ambulatory Visit: Payer: Medicare Other | Admitting: Nurse Practitioner

## 2023-05-11 DIAGNOSIS — E118 Type 2 diabetes mellitus with unspecified complications: Secondary | ICD-10-CM | POA: Diagnosis not present

## 2023-05-11 DIAGNOSIS — Z794 Long term (current) use of insulin: Secondary | ICD-10-CM | POA: Diagnosis not present

## 2023-05-17 ENCOUNTER — Encounter: Payer: Self-pay | Admitting: Nurse Practitioner

## 2023-05-17 ENCOUNTER — Ambulatory Visit (INDEPENDENT_AMBULATORY_CARE_PROVIDER_SITE_OTHER): Payer: 59 | Admitting: Nurse Practitioner

## 2023-05-17 VITALS — BP 102/62 | HR 72 | Temp 97.8°F | Resp 16 | Ht 63.0 in | Wt 219.0 lb

## 2023-05-17 DIAGNOSIS — M1A09X0 Idiopathic chronic gout, multiple sites, without tophus (tophi): Secondary | ICD-10-CM | POA: Diagnosis not present

## 2023-05-17 DIAGNOSIS — G63 Polyneuropathy in diseases classified elsewhere: Secondary | ICD-10-CM

## 2023-05-17 DIAGNOSIS — R600 Localized edema: Secondary | ICD-10-CM | POA: Diagnosis not present

## 2023-05-17 DIAGNOSIS — Z79899 Other long term (current) drug therapy: Secondary | ICD-10-CM

## 2023-05-17 DIAGNOSIS — I10 Essential (primary) hypertension: Secondary | ICD-10-CM | POA: Diagnosis not present

## 2023-05-17 DIAGNOSIS — R300 Dysuria: Secondary | ICD-10-CM

## 2023-05-17 DIAGNOSIS — Z0001 Encounter for general adult medical examination with abnormal findings: Secondary | ICD-10-CM | POA: Diagnosis not present

## 2023-05-17 MED ORDER — CILOSTAZOL 50 MG PO TABS
50.0000 mg | ORAL_TABLET | Freq: Two times a day (BID) | ORAL | 1 refills | Status: DC
Start: 2023-05-17 — End: 2023-12-16

## 2023-05-17 MED ORDER — DAPAGLIFLOZIN PROPANEDIOL 10 MG PO TABS: 10.0000 mg | ORAL_TABLET | Freq: Every day | ORAL | 1 refills | Status: DC

## 2023-05-17 MED ORDER — OLMESARTAN-AMLODIPINE-HCTZ 40-10-25 MG PO TABS
1.0000 | ORAL_TABLET | Freq: Every day | ORAL | 1 refills | Status: DC
Start: 2023-05-17 — End: 2023-12-16

## 2023-05-17 MED ORDER — FLUTICASONE-SALMETEROL 250-50 MCG/ACT IN AEPB
INHALATION_SPRAY | RESPIRATORY_TRACT | 2 refills | Status: DC
Start: 2023-05-17 — End: 2024-10-03

## 2023-05-17 MED ORDER — HYDROXYCHLOROQUINE SULFATE 200 MG PO TABS: 200.0000 mg | ORAL_TABLET | Freq: Two times a day (BID) | ORAL | 1 refills | Status: DC

## 2023-05-17 MED ORDER — DULAGLUTIDE 4.5 MG/0.5ML ~~LOC~~ SOAJ: 4.5000 mg | SUBCUTANEOUS | 1 refills | Status: DC

## 2023-05-17 MED ORDER — GABAPENTIN 300 MG PO CAPS: 300.0000 mg | ORAL_CAPSULE | Freq: Three times a day (TID) | ORAL | 0 refills | Status: DC

## 2023-05-17 MED ORDER — EZETIMIBE 10 MG PO TABS: 10.0000 mg | ORAL_TABLET | Freq: Every day | ORAL | 1 refills | Status: DC

## 2023-05-17 NOTE — Progress Notes (Signed)
[facility] [address] [place], [place] [address]  Internal MEDICINE  Office Visit Note  Patient Name: Chelsey [name]  [id number]  [id number]  Date of Service: 05/17/2023  Chief Complaint  Patient presents with   Medicare Wellness   Hypertension   Diabetes    HPI Ruah presents for an annual well visit and physical exam.  Well-appearing 72 y.o. female with hypertension, PVD, sarcoidosis of lung, fatty liver, diabetes, hypothyroidism, neuropathy, gout, arthritis, CKD, IDA, high cholesterol, and hyperparathyroidism.  Routine CRC screening: due in 2028 Routine mammogram: due in October this year, clinical breast exam done today.  DEXA scan: done in 2018 Eye exam done in April foot exam: done in January  Labs: up to date.  New or worsening pain: none  Endocrinology manages her diabetes, hypothyroidism and hyperparathyroidism.       05/17/2023    8:56 AM 08/27/2021    8:59 AM 08/20/2020    8:55 AM  MMSE - Mini Mental State Exam  Orientation to time 5 5 5   Orientation to Place 5 5 5   Registration 3 3 3   Attention/ Calculation 5 5 5   Recall 3 3 3   Language- name 2 objects 2 2 2   Language- repeat 1 1 1   Language- follow 3 step command 3 3 3   Language- read & follow direction 1 1 1   Write a sentence 1 0 0  Copy design 1 1 1   Total score 30 29 29     Functional Status Survey: Is the patient deaf or have difficulty hearing?: Yes Does the patient have difficulty seeing, even when wearing glasses/contacts?: No Does the patient have difficulty concentrating, remembering, or making decisions?: No Does the patient have difficulty walking or climbing stairs?: Yes Does the patient have difficulty dressing or bathing?: No Does the patient have difficulty doing errands alone such as visiting a doctor's office or shopping?: No     09/26/2021    7:58 AM 12/25/2021    8:26 AM 04/14/2022    9:05 AM 10/30/2022    8:46 AM 05/17/2023    8:58 AM  Fall Risk  Falls in the  past year?  1 0 0 0  (RETIRED) Patient Fall Risk Level Moderate fall risk  Low fall risk    Patient at Risk for Falls Due to   No Fall Risks  No Fall Risks  Fall risk Follow up   Falls evaluation completed  Falls evaluation completed       05/17/2023    8:58 AM  Depression screen PHQ 2/9  Decreased Interest 0  Down, Depressed, Hopeless 0  PHQ - 2 Score 0      Current Medication: Outpatient Encounter Medications as of 05/17/2023  Medication Sig   albuterol (VENTOLIN HFA) 108 (90 Base) MCG/ACT inhaler Inhale 2 puffs into the lungs every 6 (six) hours as needed for wheezing or shortness of breath.   aspirin EC 81 MG tablet Take 1 tablet (81 mg total) by mouth daily.   atorvastatin (LIPITOR) 10 MG tablet TAKE ONE TABLET BY MOUTH ONCE DAILY FOR cholesterol   celecoxib (CELEBREX) 100 MG capsule TAKE 1 CAPSULE(100 MG) BY MOUTH DAILY   febuxostat (ULORIC) 40 MG tablet Take 1 tablet (40 mg total) by mouth daily.   ferrous sulfate 325 (65 FE) MG tablet Take 1 tablet (325 mg total) by mouth daily with breakfast.   glipiZIDE (GLUCOTROL XL) 2.5 MG 24 hr tablet Take 1 tablet (2.5 mg total) by mouth daily with breakfast.  glucose blood test strip Test sugar 4 x aday for uncontrolled dm on insulin E11.22   insulin isophane & regular human KwikPen (HUMULIN 70/30 KWIKPEN) (70-30) 100 UNIT/ML KwikPen INJECT 25 UNITS into THE SKIN TWICE DAILY   Insulin Pen Needle (COMFORT EZ PEN NEEDLES) 31G X 8 MM MISC Twice a day.   LANCETS ULTRA THIN MISC Apply 1 each topically daily.    levothyroxine (SYNTHROID) 50 MCG tablet TAKE ONE TABLET BY MOUTH EVERY MORNING ON AN EMPTY STOMACH   magic mouthwash (nystatin, lidocaine, diphenhydrAMINE) suspension Take 5 mLs by mouth 3 (three) times daily as needed for mouth pain.   omeprazole (PRILOSEC) 40 MG capsule Take 1 capsule (40 mg total) by mouth daily.   OXYGEN Inhale 3 L into the lungs. Nighttime use   [DISCONTINUED] cilostazol (PLETAL) 50 MG tablet Take 1 tablet (50 mg  total) by mouth 2 (two) times daily.   [DISCONTINUED] dapagliflozin propanediol (FARXIGA) 10 MG TABS tablet Take 1 tablet (10 mg total) by mouth daily.   [DISCONTINUED] Dulaglutide 4.5 MG/0.5ML SOPN Inject 4.5 mg into the skin once a week.   [DISCONTINUED] ezetimibe (ZETIA) 10 MG tablet Take 1 tablet (10 mg total) by mouth daily.   [DISCONTINUED] fluticasone-salmeterol (ADVAIR) 250-50 MCG/ACT AEPB INHALE 1 PUFF BY MOUTH INTO LUNGS IN THE MORNING and AT BEDTIME   [DISCONTINUED] gabapentin (NEURONTIN) 300 MG capsule Take 1 capsule (300 mg total) by mouth 3 (three) times daily.   [DISCONTINUED] hydroxychloroquine (PLAQUENIL) 200 MG tablet Take 1 tablet (200 mg total) by mouth 2 (two) times daily.   [DISCONTINUED] Olmesartan-amLODIPine-HCTZ 40-10-25 MG TABS Take 1 tablet by mouth daily.   cilostazol (PLETAL) 50 MG tablet Take 1 tablet (50 mg total) by mouth 2 (two) times daily.   dapagliflozin propanediol (FARXIGA) 10 MG TABS tablet Take 1 tablet (10 mg total) by mouth daily.   Dulaglutide 4.5 MG/0.5ML SOPN Inject 4.5 mg into the skin once a week.   ezetimibe (ZETIA) 10 MG tablet Take 1 tablet (10 mg total) by mouth daily.   fluticasone-salmeterol (ADVAIR) 250-50 MCG/ACT AEPB INHALE 1 PUFF BY MOUTH INTO LUNGS IN THE MORNING and AT BEDTIME   gabapentin (NEURONTIN) 300 MG capsule Take 1 capsule (300 mg total) by mouth 3 (three) times daily.   hydroxychloroquine (PLAQUENIL) 200 MG tablet Take 1 tablet (200 mg total) by mouth 2 (two) times daily.   Olmesartan-amLODIPine-HCTZ 40-10-25 MG TABS Take 1 tablet by mouth daily.   No facility-administered encounter medications on file as of 05/17/2023.    Surgical History: Past Surgical History:  Procedure Laterality Date   BRAIN SURGERY  1990's   CATARACT EXTRACTION W/PHACO Left 06/11/2016   Procedure: CATARACT EXTRACTION PHACO AND INTRAOCULAR LENS PLACEMENT (IOC);  Surgeon: Galen Manila, MD;  Location: [facility] ORS;  Service: Ophthalmology;  Laterality:  Left;  [place] 1.01 AP% 21.6 CDE 13.23 Fluid pack lot # [id number]   CEREBRAL ANEURYSM REPAIR  1990's   COILS   CORONARY ARTERY BYPASS GRAFT     EYE SURGERY  2000's   bilateral cataract   FRACTURE SURGERY     HERNIA REPAIR  2000   ventral   STENT PLACEMENT VASCULAR ([facility] HX)     VEIN BYPASS SURGERY     VENTRAL HERNIA REPAIR N/A 04/29/2016   Procedure: Laparoscopic HERNIA REPAIR VENTRAL ADULT;  Surgeon: Leafy Ro, MD;  Location: [facility] ORS;  Service: General;  Laterality: N/A;    Medical History: Past Medical History:  Diagnosis Date   Anemia  Aneurysm (HCC)    Arthritis    RHEUMATOID ARTHRITIS   Asthma    Collagen vascular disease (HCC)    COPD (chronic obstructive pulmonary disease) (HCC)    Coronary artery disease    Dementia (HCC)    Diabetes mellitus without complication (HCC)    Edema    FEET/LEGS   GERD (gastroesophageal reflux disease)    Gout    H/O wheezing    History of hiatal hernia    Hypertension    Hypothyroidism    Neuropathy    Oxygen deficiency    HS   Peripheral vascular disease (HCC)    Sarcoidosis of lung (HCC)    Seizures (HCC)    WITH BRAIN 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    Sleep apnea    OXYGEN AT NIGHT 3 L [place]    Family History: Family History  Problem Relation Age of Onset   Heart disease Mother    Hypertension Mother    Diabetes Mother    Diabetes Father    Heart disease Father    Hypertension Father    Breast cancer Maternal Aunt     Social History   Socioeconomic History   Marital status: Single    Spouse name: Not on file   Number of children: Not on file   Years of education: Not on file   Highest education level: Not on file  Occupational History   Not on file  Tobacco Use   Smoking status: Former    Packs/day: 1.00    Years: 30.00    Additional pack years: 0.00    Total pack years: 30.00    Types: Cigarettes    Quit date: 01/27/2006    Years since quitting: 17.3    Passive exposure: Past   Smokeless tobacco: Never    Tobacco comments:    quit   Substance and Sexual Activity   Alcohol use: No    Alcohol/week: 0.0 standard drinks of alcohol   Drug use: No   Sexual activity: Not on file  Other Topics Concern   Not on file  Social History Narrative   Not on file   Social Determinants of Health   Financial Resource Strain: Not on file  Food Insecurity: Not on file  Transportation Needs: Not on file  Physical Activity: Not on file  Stress: Not on file  Social Connections: Not on file  Intimate Partner Violence: Not on file      Review of Systems  Constitutional:  Positive for fatigue. Negative for activity change, appetite change, chills, fever and unexpected weight change.  HENT:  Positive for congestion and mouth sores. Negative for ear pain, rhinorrhea, sore throat and trouble swallowing.        Sore tongue  Eyes: Negative.   Respiratory: Negative.  Negative for cough, chest tightness, shortness of breath and wheezing.   Cardiovascular: Negative.  Negative for chest pain and palpitations.  Gastrointestinal: Negative.  Negative for abdominal pain, blood in stool, constipation, diarrhea, nausea and vomiting.  Endocrine: Negative.   Genitourinary: Negative.  Negative for difficulty urinating, dysuria, frequency, hematuria and urgency.  Musculoskeletal:  Positive for arthralgias, back pain and gait problem. Negative for joint swelling, myalgias and neck pain.  Skin: Negative.  Negative for rash and wound.  Allergic/Immunologic: Negative.  Negative for immunocompromised state.  Neurological:  Positive for weakness (lower extremities). Negative for dizziness, seizures, numbness and headaches.  Hematological: Negative.   Psychiatric/Behavioral: Negative.  Negative for behavioral problems, self-injury, sleep disturbance and suicidal  ideas. The patient is not nervous/anxious.     Vital Signs: BP 102/62   Pulse 72   Temp 97.8 F (36.6 C)   Resp 16   Ht 5\' 3"  (1.6 m)   Wt 219 lb (99.3 kg)    SpO2 94%   BMI 38.79 kg/m    Physical Exam Vitals reviewed.  Constitutional:      General: She is awake. She is not in acute distress.    Appearance: Normal appearance. She is well-developed and well-groomed. She is obese. She is not ill-appearing or diaphoretic.  HENT:     Head: Normocephalic and atraumatic.     Right Ear: Tympanic membrane, ear canal and external ear normal.     Left Ear: Tympanic membrane, ear canal and external ear normal.     Nose: Septal deviation present. No congestion or rhinorrhea.     Right Turbinates: Enlarged.     Left Turbinates: Enlarged.     Mouth/Throat:     Lips: Pink.     Mouth: Mucous membranes are moist. Oral lesions present.     Dentition: Abnormal dentition.     Tongue: Lesions present.     Pharynx: Oropharynx is clear. Uvula midline. No oropharyngeal exudate or posterior oropharyngeal erythema.  Eyes:     General: Lids are normal. Vision grossly intact. Gaze aligned appropriately. No scleral icterus.       Right eye: No discharge.        Left eye: No discharge.     Extraocular Movements: Extraocular movements intact.     Conjunctiva/sclera: Conjunctivae normal.     Pupils: Pupils are equal, round, and reactive to light.     Funduscopic exam:    Right eye: Red reflex present.        Left eye: Red reflex present. Neck:     Thyroid: No thyromegaly.     Vascular: No JVD.     Trachea: Trachea and phonation normal. No tracheal deviation.  Cardiovascular:     Rate and Rhythm: Normal rate and regular rhythm.     Pulses:          Carotid pulses are 3+ on the right side and 3+ on the left side.      Radial pulses are 2+ on the right side and 2+ on the left side.       Dorsalis pedis pulses are 2+ on the right side and 2+ on the left side.       Posterior tibial pulses are 2+ on the right side and 2+ on the left side.     Heart sounds: Normal heart sounds, S1 normal and S2 normal. No murmur heard.    No friction rub. No gallop.  Pulmonary:      Effort: Pulmonary effort is normal. No accessory muscle usage or respiratory distress.     Breath sounds: Normal breath sounds and air entry. No stridor. No wheezing or rales.  Chest:     Chest wall: No tenderness.  Breasts:    Breasts are symmetrical.     Right: Normal. No swelling, bleeding, inverted nipple, mass, nipple discharge, skin change or tenderness.     Left: Normal. No swelling, bleeding, inverted nipple, mass, nipple discharge, skin change or tenderness.     Comments: Declined clinical breast exam and mammogram. Abdominal:     General: Bowel sounds are normal. There is no distension.     Palpations: Abdomen is soft. There is no shifting dullness, fluid wave, mass or pulsatile mass.  Tenderness: There is no abdominal tenderness. There is no guarding or rebound.  Musculoskeletal:        General: No tenderness or deformity.     Cervical back: Normal range of motion and neck supple.     Right lower leg: Edema present.     Left lower leg: Edema present.     Right foot: Decreased range of motion. Bunion present.     Left foot: Decreased range of motion. Bunion present.  Feet:     Right foot:     Protective Sensation: 6 sites tested.  6 sites sensed.     Skin integrity: Callus and dry skin present. No ulcer, blister, skin breakdown, erythema, warmth or fissure.     Toenail Condition: Right toenails are abnormally thick.     Left foot:     Protective Sensation: 6 sites tested.  6 sites sensed.     Skin integrity: Callus and dry skin present. No ulcer, blister, skin breakdown, erythema, warmth or fissure.     Toenail Condition: Left toenails are abnormally thick.  Lymphadenopathy:     Cervical: No cervical adenopathy.     Upper Body:     Right upper body: No supraclavicular, axillary or pectoral adenopathy.     Left upper body: No supraclavicular, axillary or pectoral adenopathy.  Skin:    General: Skin is warm and dry.     Capillary Refill: Capillary refill takes less  than 2 seconds.     Coloration: Skin is not pale.     Findings: No erythema or rash.  Neurological:     Mental Status: She is alert and oriented to person, place, and time.     Cranial Nerves: No cranial nerve deficit.     Motor: No abnormal muscle tone.     Coordination: Coordination normal.     Gait: Gait normal.     Deep Tendon Reflexes: Reflexes are normal and symmetric.  Psychiatric:        Mood and Affect: Mood and affect normal.        Behavior: Behavior normal. Behavior is cooperative.        Thought Content: Thought content normal.        Judgment: Judgment normal.        Assessment/Plan: 1. Encounter for routine adult health examination with abnormal findings Age-appropriate preventive screenings and vaccinations discussed, annual physical exam completed. Routine labs for health maintenance up to date from other specialty provider, no labs due at this time. PHM updated.   2. Essential hypertension Stable, continue current medication as prescribed.  - Olmesartan-amLODIPine-HCTZ 40-10-25 MG TABS; Take 1 tablet by mouth daily.  Dispense: 90 tablet; Refill: 1  3. Bilateral lower extremity edema Rolling walker with seat ordered  - For home use only DME 4 wheeled rolling walker with seat ([id number])  4. Idiopathic chronic gout of multiple sites without tophus Rolling walker with seat ordered - For home use only DME 4 wheeled rolling walker with seat ([id number])  5. Polyneuropathy associated with underlying disease (HCC) Rolling walker with seat ordered  - For home use only DME 4 wheeled rolling walker with seat ([id number])  6. Dysuria Routine urinalysis done  - UA/M w/rflx Culture, Routine  7. Encounter for medication review Medication list reviewed, updated, and refills ordered  - cilostazol (PLETAL) 50 MG tablet; Take 1 tablet (50 mg total) by mouth 2 (two) times daily.  Dispense: 180 tablet; Refill: 1 - dapagliflozin propanediol (FARXIGA) 10 MG TABS tablet; Take 1  tablet (10 mg total) by mouth daily.  Dispense: 90 tablet; Refill: 1 - Dulaglutide 4.5 MG/0.5ML SOPN; Inject 4.5 mg into the skin once a week.  Dispense: 6 mL; Refill: 1 - ezetimibe (ZETIA) 10 MG tablet; Take 1 tablet (10 mg total) by mouth daily.  Dispense: 90 tablet; Refill: 1 - fluticasone-salmeterol (ADVAIR) 250-50 MCG/ACT AEPB; INHALE 1 PUFF BY MOUTH INTO LUNGS IN THE MORNING and AT BEDTIME  Dispense: 60 each; Refill: 2 - gabapentin (NEURONTIN) 300 MG capsule; Take 1 capsule (300 mg total) by mouth 3 (three) times daily.  Dispense: 270 capsule; Refill: 0 - hydroxychloroquine (PLAQUENIL) 200 MG tablet; Take 1 tablet (200 mg total) by mouth 2 (two) times daily.  Dispense: 180 tablet; Refill: 1     General Counseling: Jammi verbalizes understanding of the findings of todays visit and agrees with plan of treatment. I have discussed any further diagnostic evaluation that may be needed or ordered today. We also reviewed her medications today. she has been encouraged to call the office with any questions or concerns that should arise related to todays visit.    Orders Placed This Encounter  Procedures   For home use only DME 4 wheeled rolling walker with seat ([id number])   UA/M w/rflx Culture, Routine    Meds ordered this encounter  Medications   cilostazol (PLETAL) 50 MG tablet    Sig: Take 1 tablet (50 mg total) by mouth 2 (two) times daily.    Dispense:  180 tablet    Refill:  1   dapagliflozin propanediol (FARXIGA) 10 MG TABS tablet    Sig: Take 1 tablet (10 mg total) by mouth daily.    Dispense:  90 tablet    Refill:  1   Dulaglutide 4.5 MG/0.5ML SOPN    Sig: Inject 4.5 mg into the skin once a week.    Dispense:  6 mL    Refill:  1    Dx code E11.65, note increased dose from 3. Discontinue 3 mg dose.   ezetimibe (ZETIA) 10 MG tablet    Sig: Take 1 tablet (10 mg total) by mouth daily.    Dispense:  90 tablet    Refill:  1   fluticasone-salmeterol (ADVAIR) 250-50 MCG/ACT AEPB     Sig: INHALE 1 PUFF BY MOUTH INTO LUNGS IN THE MORNING and AT BEDTIME    Dispense:  60 each    Refill:  2    This prescription was filled on 05/06/2022. Any refills authorized will be placed on file.   gabapentin (NEURONTIN) 300 MG capsule    Sig: Take 1 capsule (300 mg total) by mouth 3 (three) times daily.    Dispense:  270 capsule    Refill:  0   hydroxychloroquine (PLAQUENIL) 200 MG tablet    Sig: Take 1 tablet (200 mg total) by mouth 2 (two) times daily.    Dispense:  180 tablet    Refill:  1   Olmesartan-amLODIPine-HCTZ 40-10-25 MG TABS    Sig: Take 1 tablet by mouth daily.    Dispense:  90 tablet    Refill:  1    Return in about 6 months (around 11/17/2023) for F/U, Anisten Tomassi PCP.   Total time spent:30 Minutes Time spent includes review of chart, medications, test results, and follow up plan with the patient.   [place] Controlled Substance Database was reviewed by me.  This patient was seen by Sallyanne Kuster, FNP-C in collaboration with Dr. Beverely Risen as a part of collaborative care  agreement.  Shila Kruczek R. Tedd Sias, MSN, FNP-C Internal medicine

## 2023-05-18 ENCOUNTER — Telehealth: Payer: Self-pay | Admitting: Nurse Practitioner

## 2023-05-18 ENCOUNTER — Telehealth: Payer: Self-pay

## 2023-05-18 LAB — UA/M W/RFLX CULTURE, ROUTINE
Bilirubin, UA: NEGATIVE
Ketones, UA: NEGATIVE
Leukocytes,UA: NEGATIVE
Nitrite, UA: NEGATIVE
Protein,UA: NEGATIVE
RBC, UA: NEGATIVE
Specific Gravity, UA: 1.024 (ref 1.005–1.030)
Urobilinogen, Ur: 0.2 mg/dL (ref 0.2–1.0)
pH, UA: 5 (ref 5.0–7.5)

## 2023-05-18 LAB — MICROSCOPIC EXAMINATION
Bacteria, UA: NONE SEEN
Casts: NONE SEEN /lpf
RBC, Urine: NONE SEEN /hpf (ref 0–2)

## 2023-05-18 NOTE — Telephone Encounter (Signed)
Faxed [facility] for walker  

## 2023-05-18 NOTE — Telephone Encounter (Signed)
Received [facility] supply order from. Gave to [name] for signature

## 2023-05-19 ENCOUNTER — Telehealth: Payer: Self-pay | Admitting: Nurse Practitioner

## 2023-05-19 NOTE — Telephone Encounter (Signed)
[facility] order signed. Faxed back; [phone]. To be scanned-nm

## 2023-05-21 DIAGNOSIS — E118 Type 2 diabetes mellitus with unspecified complications: Secondary | ICD-10-CM | POA: Diagnosis not present

## 2023-05-27 DIAGNOSIS — R269 Unspecified abnormalities of gait and mobility: Secondary | ICD-10-CM | POA: Diagnosis not present

## 2023-05-28 DIAGNOSIS — Z7951 Long term (current) use of inhaled steroids: Secondary | ICD-10-CM | POA: Diagnosis not present

## 2023-05-28 DIAGNOSIS — J449 Chronic obstructive pulmonary disease, unspecified: Secondary | ICD-10-CM | POA: Diagnosis not present

## 2023-05-28 DIAGNOSIS — Z79899 Other long term (current) drug therapy: Secondary | ICD-10-CM | POA: Diagnosis not present

## 2023-05-28 DIAGNOSIS — Z7982 Long term (current) use of aspirin: Secondary | ICD-10-CM | POA: Diagnosis not present

## 2023-05-28 DIAGNOSIS — Z7985 Long-term (current) use of injectable non-insulin antidiabetic drugs: Secondary | ICD-10-CM | POA: Diagnosis not present

## 2023-05-28 DIAGNOSIS — Z794 Long term (current) use of insulin: Secondary | ICD-10-CM | POA: Diagnosis not present

## 2023-05-28 DIAGNOSIS — I10 Essential (primary) hypertension: Secondary | ICD-10-CM | POA: Diagnosis not present

## 2023-05-28 DIAGNOSIS — Z87891 Personal history of nicotine dependence: Secondary | ICD-10-CM | POA: Diagnosis not present

## 2023-05-28 DIAGNOSIS — M109 Gout, unspecified: Secondary | ICD-10-CM | POA: Diagnosis not present

## 2023-05-28 DIAGNOSIS — E1151 Type 2 diabetes mellitus with diabetic peripheral angiopathy without gangrene: Secondary | ICD-10-CM | POA: Diagnosis not present

## 2023-05-28 DIAGNOSIS — K1379 Other lesions of oral mucosa: Secondary | ICD-10-CM | POA: Diagnosis not present

## 2023-05-28 DIAGNOSIS — Z7984 Long term (current) use of oral hypoglycemic drugs: Secondary | ICD-10-CM | POA: Diagnosis not present

## 2023-05-31 ENCOUNTER — Ambulatory Visit (INDEPENDENT_AMBULATORY_CARE_PROVIDER_SITE_OTHER): Payer: 59 | Admitting: Podiatry

## 2023-05-31 ENCOUNTER — Encounter: Payer: Self-pay | Admitting: Podiatry

## 2023-05-31 VITALS — BP 121/60 | HR 66

## 2023-05-31 DIAGNOSIS — M79676 Pain in unspecified toe(s): Secondary | ICD-10-CM | POA: Diagnosis not present

## 2023-05-31 DIAGNOSIS — E1159 Type 2 diabetes mellitus with other circulatory complications: Secondary | ICD-10-CM

## 2023-05-31 DIAGNOSIS — B351 Tinea unguium: Secondary | ICD-10-CM

## 2023-05-31 NOTE — Progress Notes (Signed)
  Subjective:  Patient ID: Chelsey [name], female    DOB: 1951-01-27,  MRN: [id number]  Chelsey [name] presents to clinic today for at risk foot care. Pt has h/o NIDDM with PAD and painful elongated mycotic toenails 1-5 bilaterally which are tender when wearing enclosed shoe gear. Pain is relieved with periodic professional debridement.  Chief Complaint  Patient presents with   Diabetes    "Do my toenails."  Dr. Welton Flakes @ [facility] - lov last month, A1c - 8, glucose today - 180 mg/dl   New problem(s): None.   PCP is Chelsey Kuster, NP.  Allergies  Allergen Reactions   Oxycodone-Acetaminophen Other (See Comments)    Hallucinations Hallucinations HALLUCINATIONS Other reaction(s): Other (See Comments) HALLUCINATIONS Other reaction(s): Other (See Comments) Hallucinations Hallucinations HALLUCINATIONS Hallucinations Other reaction(s): Other (See Comments), Other (See Comments) HALLUCINATIONS Hallucinations Hallucinations HALLUCINATIONS Other reaction(s): Other (See Comments) HALLUCINATIONS Other reaction(s): Other (See Comments) Hallucinations Hallucinations HALLUCINATIONS Hallucinations Other reaction(s): Other (See Comments) HALLUCINATIONS Other reaction(s): Other (See Comments) Hallucinations Hallucinations HALLUCINATIONS Hallucinations    Indomethacin Other (See Comments)    BURN HOLE IN STOMACH BURN HOLE IN STOMACH Other reaction(s): Other (See Comments), Other (See Comments) BURN HOLE IN STOMACH BURN HOLE IN STOMACH BURN HOLE IN STOMACH BURN HOLE IN STOMACH BURN HOLE IN STOMACH    Penicillins Rash    Review of Systems: Negative except as noted in the HPI.  Objective: No changes noted in today's physical examination. 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 female in NAD. AAO x 3.  Vascular Examination: Capillary refill time immediate b/l. Vascular status intact b/l with faintly  palpable pedal pulses. Pedal hair absent b/l. No pain with calf compression b/l. Skin temperature gradient WNL b/l. No cyanosis or clubbing b/l. No ischemia or gangrene noted b/l. No edema noted b/l LE.  Neurological Examination: Protective sensation diminished with 10g monofilament b/l.  Dermatological Examination: Pedal skin with normal turgor, texture and tone b/l.  No open wounds. No interdigital macerations.   Toenails 1-5 b/l thick, discolored, elongated with subungual debris and pain on dorsal palpation.   No hyperkeratotic nor porokeratotic lesions present on today's visit.  Musculoskeletal Examination: Normal muscle strength 5/5 to all lower extremity muscle groups bilaterally. DJD b/l subtalar joint. HAV with bunion deformity noted b/l LE. Pes planus deformity noted bilateral LE.Marland Kitchen No pain, crepitus or joint limitation noted with ROM b/l LE.  Patient ambulates independently without assistive aids.  Radiographs: None  Assessment/Plan: 1. Pain due to onychomycosis of toenail   2. Type 2 diabetes mellitus with vascular disease (HCC)     -Patient was evaluated and treated. All patient's and/or POA's questions/concerns answered on today's visit. -Continue foot and shoe inspections daily. Monitor blood glucose per PCP/Endocrinologist's recommendations. -Patient to continue soft, supportive shoe gear daily. -Toenails 1-5 bilaterally were debrided in length and girth with sterile nail nippers and dremel. Pinpoint bleeding of L 3rd toe addressed with Lumicain Hemostatic Solution, cleansed with alcohol. triple antibiotic ointment applied. No further treatment required by patient/caregiver. -Patient/POA to call should there be question/concern in the interim.   Return in about 3 months (around 08/31/2023).  Freddie Breech, DPM

## 2023-06-02 ENCOUNTER — Ambulatory Visit: Payer: 59 | Admitting: Nurse Practitioner

## 2023-06-04 DIAGNOSIS — L731 Pseudofolliculitis barbae: Secondary | ICD-10-CM | POA: Diagnosis not present

## 2023-06-07 ENCOUNTER — Other Ambulatory Visit: Payer: Self-pay

## 2023-06-07 ENCOUNTER — Emergency Department
Admission: EM | Admit: 2023-06-07 | Discharge: 2023-06-07 | Disposition: A | Discharge status: Home / Self Care | Payer: 59 | Attending: Emergency Medicine | Admitting: Emergency Medicine

## 2023-06-07 ENCOUNTER — Emergency Department: Payer: 59

## 2023-06-07 DIAGNOSIS — M1712 Unilateral primary osteoarthritis, left knee: Secondary | ICD-10-CM | POA: Diagnosis not present

## 2023-06-07 DIAGNOSIS — M25562 Pain in left knee: Secondary | ICD-10-CM | POA: Insufficient documentation

## 2023-06-07 MED ORDER — PREDNISONE 10 MG PO TABS: 30.0000 mg | ORAL_TABLET | Freq: Every day | ORAL | 0 refills | Status: AC

## 2023-06-07 MED ORDER — PREDNISONE 20 MG PO TABS
30.0000 mg | ORAL_TABLET | Freq: Once | ORAL | Status: AC
  Administered 2023-06-07: 30 mg via ORAL
  Filled 2023-06-07: qty 2

## 2023-06-07 NOTE — ED Provider Notes (Signed)
[facility] Provider Note  Patient Contact: 10:37 PM (approximate)   History   Leg Swelling   HPI  Chelsey [name] is a 72 y.o. female who presents emergency department complaining of left knee pain.  Nontraumatic in nature.  History of arthritis and has received injections into the knee in the past.  She is roughly 2 to 3 months after her last knee injection.  She has had some pain in her knee no erythema, edema, warmth is reported.  No other complaints at this time.     Physical Exam   Triage Vital Signs: ED Triage Vitals  Encounter Vitals Group     BP 06/07/23 2032 134/85     Systolic BP Percentile --      Diastolic BP Percentile --      Pulse Rate 06/07/23 2032 71     Resp 06/07/23 2032 16     Temp 06/07/23 2032 97.9 F (36.6 C)     Temp Source 06/07/23 2032 Oral     SpO2 06/07/23 2032 94 %     Weight --      Height --      Head Circumference --      Peak Flow --      Pain Score 06/07/23 2033 9     Pain Loc --      Pain Education --      Exclude from Growth Chart --     Most recent vital signs: Vitals:   06/07/23 2032  BP: 134/85  Pulse: 71  Resp: 16  Temp: 97.9 F (36.6 C)  SpO2: 94%     General: Alert and in no acute distress.  Cardiovascular:  Good peripheral perfusion Respiratory: Normal respiratory effort without tachypnea or retractions. Lungs CTAB.  Musculoskeletal: Full range of motion to all extremities.  No overlying skin changes to the left knee.  No gross edema, erythema, warmth to palpation.  No evidence of ballottement.  Good range of motion is preserved.  Patient reports tenderness over the patella.  No palpable abnormality.  Dorsalis pedis pulse and sensation intact distally. Neurologic:  No gross focal neurologic deficits are appreciated.  Skin:   No rash noted Other:   ED Results / Procedures / Treatments   Labs (all labs ordered are listed, but only abnormal results are displayed) Labs Reviewed - No  data to display   EKG     RADIOLOGY  I personally viewed, evaluated, and interpreted these images as part of my medical decision making, as well as reviewing the written report by the radiologist.  ED Provider Interpretation: Tricompartmental arthritis without other acute finding.  DG Knee Complete 4 Views Left  Result Date: 06/07/2023 CLINICAL DATA:  Left knee pain EXAM: LEFT KNEE - COMPLETE 4+ VIEW COMPARISON:  None Available. FINDINGS: No evidence of fracture, dislocation, or joint effusion. Tricompartmental knee joint space narrowing with marginal osteophytes. Soft tissues are unremarkable. IMPRESSION: Moderate tricompartmental knee osteoarthritis. Electronically Signed   By: Larose Hires D.O.   On: 06/07/2023 21:16    PROCEDURES:  Critical Care performed: No  Procedures   MEDICATIONS ORDERED IN ED: Medications  predniSONE (DELTASONE) tablet 30 mg (has no administration in time range)     IMPRESSION / MDM / ASSESSMENT AND PLAN / ED COURSE  I reviewed the triage vital signs and the nursing notes.  Differential diagnosis includes, but is not limited to, arthritis, gout, septic arthritis, joint effusion   Patient's presentation is most consistent with acute presentation with potential threat to life or bodily function.   Patient's diagnosis is consistent with knee pain.  Patient presents emergency department with nontraumatic left knee pain.  History of arthritis in same.  She gets injections into the joint for roughly 2 to 3 months after her last injection.  There is no concerning findings on physical exam to be concern for cellulitis, septic arthritis or even gout.  X-ray reveals tricompartmental arthritis.  Suspect that her last injection is wearing off and she is having a return of her knee pain.  She is a diabetic, has a history of chronic kidney disease as well as a history of gastric ulcer.  As such I will provide low-dose steroid but  patient is instructed to closely watch her blood sugar while on the steroid.  Also recommend following up with orthopedics to see if she may have a another injection of steroids into the knee.  Patient is agreeable with this plan.  Follow-up primary care as needed..  Patient is given ED precautions to return to the ED for any worsening or new symptoms.     FINAL CLINICAL IMPRESSION(S) / ED DIAGNOSES   Final diagnoses:  Acute pain of left knee     Rx / DC Orders   ED Discharge Orders          Ordered    predniSONE (DELTASONE) 10 MG tablet  Daily with breakfast        06/07/23 2236             Note:  This document was prepared using Dragon voice recognition software and may include unintentional dictation errors.   Lanette Hampshire 06/07/23 2239    Jene Every, MD 06/10/23 [phone]

## 2023-06-07 NOTE — ED Triage Notes (Addendum)
Pt presents to ER with c/o knee/leg swelling that started yesterday.  Pt states welling is right below her left kneecap.  Denies any recent injuries to left leg, but states she has had issues with left knee in past, and has received injections in left knee for pain.  Pt states when she has been walking it feels like her knee is "trying to jump out of socket."  No open wounds noted, no redness or warmth on palpation.  PMS intact distally.  Pt is otherwise A&O x4 and in NAD.

## 2023-06-09 ENCOUNTER — Telehealth: Payer: Self-pay | Admitting: Nurse Practitioner

## 2023-06-09 NOTE — Telephone Encounter (Signed)
Lcm to schedule ED f/u-Toni

## 2023-06-10 DIAGNOSIS — Z794 Long term (current) use of insulin: Secondary | ICD-10-CM | POA: Diagnosis not present

## 2023-06-10 DIAGNOSIS — E118 Type 2 diabetes mellitus with unspecified complications: Secondary | ICD-10-CM | POA: Diagnosis not present

## 2023-06-11 DIAGNOSIS — M1712 Unilateral primary osteoarthritis, left knee: Secondary | ICD-10-CM | POA: Diagnosis not present

## 2023-06-14 ENCOUNTER — Ambulatory Visit: Payer: 59 | Admitting: Physician Assistant

## 2023-06-23 DIAGNOSIS — E118 Type 2 diabetes mellitus with unspecified complications: Secondary | ICD-10-CM | POA: Diagnosis not present

## 2023-06-23 DIAGNOSIS — I10 Essential (primary) hypertension: Secondary | ICD-10-CM | POA: Diagnosis not present

## 2023-06-24 ENCOUNTER — Other Ambulatory Visit: Payer: Self-pay | Admitting: Nurse Practitioner

## 2023-06-25 DIAGNOSIS — I739 Peripheral vascular disease, unspecified: Secondary | ICD-10-CM | POA: Diagnosis not present

## 2023-06-25 DIAGNOSIS — Z87891 Personal history of nicotine dependence: Secondary | ICD-10-CM | POA: Diagnosis not present

## 2023-06-25 DIAGNOSIS — I10 Essential (primary) hypertension: Secondary | ICD-10-CM | POA: Diagnosis not present

## 2023-06-25 DIAGNOSIS — K148 Other diseases of tongue: Secondary | ICD-10-CM | POA: Diagnosis not present

## 2023-06-25 DIAGNOSIS — Z833 Family history of diabetes mellitus: Secondary | ICD-10-CM | POA: Diagnosis not present

## 2023-06-25 DIAGNOSIS — E079 Disorder of thyroid, unspecified: Secondary | ICD-10-CM | POA: Diagnosis not present

## 2023-06-25 DIAGNOSIS — J449 Chronic obstructive pulmonary disease, unspecified: Secondary | ICD-10-CM | POA: Diagnosis not present

## 2023-06-25 DIAGNOSIS — D860 Sarcoidosis of lung: Secondary | ICD-10-CM | POA: Diagnosis not present

## 2023-06-25 DIAGNOSIS — Z794 Long term (current) use of insulin: Secondary | ICD-10-CM | POA: Diagnosis not present

## 2023-06-25 DIAGNOSIS — Z79899 Other long term (current) drug therapy: Secondary | ICD-10-CM | POA: Diagnosis not present

## 2023-06-25 DIAGNOSIS — Z7982 Long term (current) use of aspirin: Secondary | ICD-10-CM | POA: Diagnosis not present

## 2023-06-25 DIAGNOSIS — E119 Type 2 diabetes mellitus without complications: Secondary | ICD-10-CM | POA: Diagnosis not present

## 2023-06-25 DIAGNOSIS — M109 Gout, unspecified: Secondary | ICD-10-CM | POA: Diagnosis not present

## 2023-06-25 DIAGNOSIS — Z7985 Long-term (current) use of injectable non-insulin antidiabetic drugs: Secondary | ICD-10-CM | POA: Diagnosis not present

## 2023-06-25 DIAGNOSIS — Z885 Allergy status to narcotic agent status: Secondary | ICD-10-CM | POA: Diagnosis not present

## 2023-06-25 DIAGNOSIS — Z7951 Long term (current) use of inhaled steroids: Secondary | ICD-10-CM | POA: Diagnosis not present

## 2023-06-30 DIAGNOSIS — Z79899 Other long term (current) drug therapy: Secondary | ICD-10-CM | POA: Diagnosis not present

## 2023-07-05 DIAGNOSIS — M1712 Unilateral primary osteoarthritis, left knee: Secondary | ICD-10-CM | POA: Diagnosis not present

## 2023-07-08 ENCOUNTER — Ambulatory Visit (INDEPENDENT_AMBULATORY_CARE_PROVIDER_SITE_OTHER): Payer: 59 | Admitting: Physician Assistant

## 2023-07-08 ENCOUNTER — Encounter: Payer: Self-pay | Admitting: Physician Assistant

## 2023-07-08 VITALS — BP 110/65 | HR 70 | Temp 98.0°F | Resp 16 | Ht 63.0 in | Wt 224.4 lb

## 2023-07-08 DIAGNOSIS — Z794 Long term (current) use of insulin: Secondary | ICD-10-CM

## 2023-07-08 DIAGNOSIS — M1712 Unilateral primary osteoarthritis, left knee: Secondary | ICD-10-CM | POA: Diagnosis not present

## 2023-07-08 DIAGNOSIS — E1165 Type 2 diabetes mellitus with hyperglycemia: Secondary | ICD-10-CM

## 2023-07-08 NOTE — Progress Notes (Signed)
[facility] [address] [place], [place] [address]  Internal MEDICINE  Office Visit Note  Patient Name: Chelsey [name]  [id number]  [id number]  Date of Service: 07/08/2023  Chief Complaint  Patient presents with   Hospitalization Follow-up   Knee Pain    Left knee pain, given prednisone in ED but caused sugar levels to spike too much     HPI Pt is here for an ED follow up -Has been having left knee pain and weakness for awhile. Was getting injections in knee -Went to ED a month ago and was given oral prednisone, but it drove sugar up too high and couldn't take them all -Went back to ortho a few days ago and they want to do surgery, but sugar too high. Need A1c <7.5 -States she is seeing endo in [place] now and states they recently started her on mounjaro to help, but could not tolerate this. She has not notified their office of this yet and will do so now to hopefully get sugar better controlled in order to move forward with surgery for knee as this is really limiting her -She does have rollator now but did not bring it to visit now. Discussed utilizing this to increase safety and minimize fall risk   Current Medication:  Outpatient Encounter Medications as of 07/08/2023  Medication Sig   albuterol (VENTOLIN HFA) 108 (90 Base) MCG/ACT inhaler Inhale 2 puffs into the lungs every 6 (six) hours as needed for wheezing or shortness of breath.   aspirin EC 81 MG tablet Take 1 tablet (81 mg total) by mouth daily.   atorvastatin (LIPITOR) 10 MG tablet TAKE ONE TABLET BY MOUTH ONCE DAILY FOR cholesterol   celecoxib (CELEBREX) 100 MG capsule TAKE 1 CAPSULE(100 MG) BY MOUTH DAILY   cilostazol (PLETAL) 50 MG tablet Take 1 tablet (50 mg total) by mouth 2 (two) times daily.   dapagliflozin propanediol (FARXIGA) 10 MG TABS tablet Take 1 tablet (10 mg total) by mouth daily.   Dulaglutide 4.5 MG/0.5ML SOPN Inject 4.5 mg into the skin once a week.   ezetimibe (ZETIA) 10 MG  tablet Take 1 tablet (10 mg total) by mouth daily.   febuxostat (ULORIC) 40 MG tablet Take 1 tablet (40 mg total) by mouth daily.   ferrous sulfate 325 (65 FE) MG tablet Take 1 tablet (325 mg total) by mouth daily with breakfast.   fluticasone-salmeterol (ADVAIR) 250-50 MCG/ACT AEPB INHALE 1 PUFF BY MOUTH INTO LUNGS IN THE MORNING and AT BEDTIME   gabapentin (NEURONTIN) 300 MG capsule Take 1 capsule (300 mg total) by mouth 3 (three) times daily.   glipiZIDE (GLUCOTROL XL) 2.5 MG 24 hr tablet Take 1 tablet (2.5 mg total) by mouth daily with breakfast.   glucose blood test strip Test sugar 4 x aday for uncontrolled dm on insulin E11.22   HUMULIN 70/30 KWIKPEN (70-30) 100 UNIT/ML KwikPen INJECT 25 UNITS INTO THE SKIN TWICE DAILY   hydroxychloroquine (PLAQUENIL) 200 MG tablet Take 1 tablet (200 mg total) by mouth 2 (two) times daily.   Insulin Pen Needle (COMFORT EZ PEN NEEDLES) 31G X 8 MM MISC Twice a day.   LANCETS ULTRA THIN MISC Apply 1 each topically daily.    levothyroxine (SYNTHROID) 50 MCG tablet TAKE ONE TABLET BY MOUTH EVERY MORNING ON AN EMPTY STOMACH   magic mouthwash (nystatin, lidocaine, diphenhydrAMINE) suspension Take 5 mLs by mouth 3 (three) times daily as needed for mouth pain.   Olmesartan-amLODIPine-HCTZ 40-10-25 MG TABS  Take 1 tablet by mouth daily.   omeprazole (PRILOSEC) 40 MG capsule Take 1 capsule (40 mg total) by mouth daily.   OXYGEN Inhale 3 L into the lungs. Nighttime use   No facility-administered encounter medications on file as of 07/08/2023.      Medical History: Past Medical History:  Diagnosis Date   Anemia    Aneurysm (HCC)    Arthritis    RHEUMATOID ARTHRITIS   Asthma    Collagen vascular disease (HCC)    COPD (chronic obstructive pulmonary disease) (HCC)    Coronary artery disease    Dementia (HCC)    Diabetes mellitus without complication (HCC)    Edema    FEET/LEGS   GERD (gastroesophageal reflux disease)    Gout    H/O wheezing    History of  hiatal hernia    Hypertension    Hypothyroidism    Neuropathy    Oxygen deficiency    HS   Peripheral vascular disease (HCC)    Sarcoidosis of lung (HCC)    Seizures (HCC)    WITH BRAIN 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    Sleep apnea    OXYGEN AT NIGHT 3 L [place]     Vital Signs: BP 110/65   Pulse 70   Temp 98 F (36.7 C)   Resp 16   Ht 5\' 3"  (1.6 m)   Wt 224 lb 6.4 oz (101.8 kg)   SpO2 93%   BMI 39.75 kg/m    Review of Systems  Constitutional:  Positive for fatigue. Negative for fever.  HENT:  Negative for congestion, mouth sores and postnasal drip.   Respiratory:  Negative for cough.   Cardiovascular:  Positive for leg swelling. Negative for chest pain.  Genitourinary:  Negative for flank pain.  Musculoskeletal:  Positive for arthralgias and gait problem.  Psychiatric/Behavioral: Negative.      Physical Exam Vitals and nursing note reviewed.  Constitutional:      Appearance: Normal appearance.  HENT:     Head: Normocephalic and atraumatic.  Eyes:     Pupils: Pupils are equal, round, and reactive to light.  Cardiovascular:     Rate and Rhythm: Normal rate and regular rhythm.  Pulmonary:     Effort: Pulmonary effort is normal.     Breath sounds: Normal breath sounds.  Musculoskeletal:     Comments: Chronic left knee pain; min lower extremity swelling  Skin:    General: Skin is warm and dry.  Neurological:     Mental Status: She is alert.  Psychiatric:        Mood and Affect: Mood normal.        Behavior: Behavior normal.       Assessment/Plan: 1. Osteoarthritis of left knee, unspecified osteoarthritis type Will follow up with ortho, plan for surgery once sugar better controlled  2. Type 2 diabetes mellitus with hyperglycemia, with long-term current use of insulin (HCC) Pt will contact endo regarding medication S/E to mounjaro. Will need further control prior to scheduling knee surgery   General Counseling: Chelsey [name] verbalizes understanding of the findings of  todays visit and agrees with plan of treatment. I have discussed any further diagnostic evaluation that may be needed or ordered today. We also reviewed her medications today. she has been encouraged to call the office with any questions or concerns that should arise related to todays visit.    Counseling:    No orders of the defined types were placed in this encounter.   No orders  of the defined types were placed in this encounter.   Time spent:30 Minutes

## 2023-07-09 ENCOUNTER — Other Ambulatory Visit: Payer: Self-pay | Admitting: Physician Assistant

## 2023-07-09 DIAGNOSIS — R600 Localized edema: Secondary | ICD-10-CM

## 2023-07-09 DIAGNOSIS — I10 Essential (primary) hypertension: Secondary | ICD-10-CM

## 2023-07-09 DIAGNOSIS — I739 Peripheral vascular disease, unspecified: Secondary | ICD-10-CM

## 2023-07-10 DIAGNOSIS — E118 Type 2 diabetes mellitus with unspecified complications: Secondary | ICD-10-CM | POA: Diagnosis not present

## 2023-07-10 DIAGNOSIS — Z794 Long term (current) use of insulin: Secondary | ICD-10-CM | POA: Diagnosis not present

## 2023-07-23 DIAGNOSIS — L731 Pseudofolliculitis barbae: Secondary | ICD-10-CM | POA: Diagnosis not present

## 2023-07-26 DIAGNOSIS — Z79899 Other long term (current) drug therapy: Secondary | ICD-10-CM | POA: Diagnosis not present

## 2023-07-26 DIAGNOSIS — E119 Type 2 diabetes mellitus without complications: Secondary | ICD-10-CM | POA: Diagnosis not present

## 2023-07-26 DIAGNOSIS — M069 Rheumatoid arthritis, unspecified: Secondary | ICD-10-CM | POA: Diagnosis not present

## 2023-07-26 DIAGNOSIS — Z961 Presence of intraocular lens: Secondary | ICD-10-CM | POA: Diagnosis not present

## 2023-07-27 DIAGNOSIS — E118 Type 2 diabetes mellitus with unspecified complications: Secondary | ICD-10-CM | POA: Diagnosis not present

## 2023-08-10 ENCOUNTER — Ambulatory Visit: Payer: 59 | Admitting: Nurse Practitioner

## 2023-08-11 ENCOUNTER — Ambulatory Visit: Payer: 59 | Admitting: Nurse Practitioner

## 2023-08-11 ENCOUNTER — Encounter: Payer: Self-pay | Admitting: Nurse Practitioner

## 2023-08-11 VITALS — BP 111/64 | HR 78 | Temp 98.2°F | Resp 16 | Ht 63.0 in | Wt 224.0 lb

## 2023-08-11 DIAGNOSIS — Z23 Encounter for immunization: Secondary | ICD-10-CM

## 2023-08-11 DIAGNOSIS — R600 Localized edema: Secondary | ICD-10-CM

## 2023-08-11 DIAGNOSIS — I10 Essential (primary) hypertension: Secondary | ICD-10-CM | POA: Diagnosis not present

## 2023-08-11 MED ORDER — FUROSEMIDE 20 MG PO TABS: 20.0000 mg | ORAL_TABLET | Freq: Every day | ORAL | 3 refills | Status: DC | PRN

## 2023-08-11 NOTE — Progress Notes (Signed)
[facility] [address] [place], [place] [address]  Internal MEDICINE  Office Visit Note  Patient Name: Chelsey [name]  [id number]  [id number]  Date of Service: 08/11/2023  Chief Complaint  Patient presents with   Acute Visit    Bilateral legs swelling      HPI Clancy presents for an acute sick visit for lower extremity edema.  - has been having swelling of legs off and on but has gotten worse over the past month. BP is ok.   Also requesting flu vaccine.      Current Medication:  Outpatient Encounter Medications as of 08/11/2023  Medication Sig   albuterol (VENTOLIN HFA) 108 (90 Base) MCG/ACT inhaler Inhale 2 puffs into the lungs every 6 (six) hours as needed for wheezing or shortness of breath.   aspirin EC 81 MG tablet Take 1 tablet (81 mg total) by mouth daily.   atorvastatin (LIPITOR) 10 MG tablet TAKE ONE TABLET BY MOUTH ONCE DAILY FOR cholesterol   celecoxib (CELEBREX) 100 MG capsule TAKE 1 CAPSULE(100 MG) BY MOUTH DAILY   cilostazol (PLETAL) 50 MG tablet Take 1 tablet (50 mg total) by mouth 2 (two) times daily.   dapagliflozin propanediol (FARXIGA) 10 MG TABS tablet Take 1 tablet (10 mg total) by mouth daily.   Dulaglutide 4.5 MG/0.5ML SOPN Inject 4.5 mg into the skin once a week.   ezetimibe (ZETIA) 10 MG tablet Take 1 tablet (10 mg total) by mouth daily.   febuxostat (ULORIC) 40 MG tablet Take 1 tablet (40 mg total) by mouth daily.   ferrous sulfate 325 (65 FE) MG tablet Take 1 tablet (325 mg total) by mouth daily with breakfast.   fluticasone-salmeterol (ADVAIR) 250-50 MCG/ACT AEPB INHALE 1 PUFF BY MOUTH INTO LUNGS IN THE MORNING and AT BEDTIME   furosemide (LASIX) 20 MG tablet Take 1 tablet (20 mg total) by mouth daily as needed (increased swelling of legs).   gabapentin (NEURONTIN) 300 MG capsule Take 1 capsule (300 mg total) by mouth 3 (three) times daily.   glipiZIDE (GLUCOTROL XL) 2.5 MG 24 hr tablet Take 1 tablet (2.5 mg total) by mouth daily  with breakfast.   glucose blood test strip Test sugar 4 x aday for uncontrolled dm on insulin E11.22   HUMULIN 70/30 KWIKPEN (70-30) 100 UNIT/ML KwikPen INJECT 25 UNITS INTO THE SKIN TWICE DAILY   hydroxychloroquine (PLAQUENIL) 200 MG tablet Take 1 tablet (200 mg total) by mouth 2 (two) times daily.   Insulin Pen Needle (COMFORT EZ PEN NEEDLES) 31G X 8 MM MISC Twice a day.   LANCETS ULTRA THIN MISC Apply 1 each topically daily.    levothyroxine (SYNTHROID) 50 MCG tablet TAKE ONE TABLET BY MOUTH EVERY MORNING ON AN EMPTY STOMACH   magic mouthwash (nystatin, lidocaine, diphenhydrAMINE) suspension Take 5 mLs by mouth 3 (three) times daily as needed for mouth pain.   Olmesartan-amLODIPine-HCTZ 40-10-25 MG TABS Take 1 tablet by mouth daily.   omeprazole (PRILOSEC) 40 MG capsule Take 1 capsule (40 mg total) by mouth daily.   OXYGEN Inhale 3 L into the lungs. Nighttime use   No facility-administered encounter medications on file as of 08/11/2023.      Medical History: Past Medical History:  Diagnosis Date   Anemia    Aneurysm (HCC)    Arthritis    RHEUMATOID ARTHRITIS   Asthma    Collagen vascular disease (HCC)    COPD (chronic obstructive pulmonary disease) (HCC)    Coronary artery disease  Dementia (HCC)    Diabetes mellitus without complication (HCC)    Edema    FEET/LEGS   GERD (gastroesophageal reflux disease)    Gout    H/O wheezing    History of hiatal hernia    Hypertension    Hypothyroidism    Neuropathy    Oxygen deficiency    HS   Peripheral vascular disease (HCC)    Sarcoidosis of lung (HCC)    Seizures (HCC)    WITH BRAIN 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    Sleep apnea    OXYGEN AT NIGHT 3 L [place]     Vital Signs: BP 111/64   Pulse 78   Temp 98.2 F (36.8 C)   Resp 16   Ht 5\' 3"  (1.6 m)   Wt 224 lb (101.6 kg)   SpO2 93%   BMI 39.68 kg/m    Review of Systems  Constitutional:  Positive for fatigue. Negative for fever.  HENT:  Negative for congestion, mouth  sores and postnasal drip.   Respiratory:  Negative for cough.   Cardiovascular:  Positive for leg swelling. Negative for chest pain.  Genitourinary:  Negative for flank pain.  Musculoskeletal:  Positive for arthralgias and gait problem.  Psychiatric/Behavioral: Negative.      Physical Exam Vitals and nursing note reviewed.  Constitutional:      Appearance: Normal appearance.  HENT:     Head: Normocephalic and atraumatic.  Eyes:     Pupils: Pupils are equal, round, and reactive to light.  Cardiovascular:     Rate and Rhythm: Normal rate and regular rhythm.  Pulmonary:     Effort: Pulmonary effort is normal.     Breath sounds: Normal breath sounds.  Musculoskeletal:     Right lower leg: 2+ Pitting Edema present.     Left lower leg: 2+ Pitting Edema present.     Comments: Chronic left knee pain; min lower extremity swelling  Skin:    General: Skin is warm and dry.  Neurological:     Mental Status: She is alert.  Psychiatric:        Mood and Affect: Mood normal.        Behavior: Behavior normal.       Assessment/Plan: 1. Bilateral lower extremity edema Take furosemide once daily for fluid retention and leg swelling - furosemide (LASIX) 20 MG tablet; Take 1 tablet (20 mg total) by mouth daily as needed (increased swelling of legs).  Dispense: 30 tablet; Refill: 3  2. Essential hypertension Stable on current medications, added prn furosemide for leg swelling and fluid retention.  - furosemide (LASIX) 20 MG tablet; Take 1 tablet (20 mg total) by mouth daily as needed (increased swelling of legs).  Dispense: 30 tablet; Refill: 3  3. Need for vaccination Flu vaccine administered in office today  - Influenza, MDCK, trivalent, PF(Flucelvax egg-free)   General Counseling: Aviendha verbalizes understanding of the findings of todays visit and agrees with plan of treatment. I have discussed any further diagnostic evaluation that may be needed or ordered today. We also reviewed her  medications today. she has been encouraged to call the office with any questions or concerns that should arise related to todays visit.    Counseling:    Orders Placed This Encounter  Procedures   Influenza, MDCK, trivalent, PF(Flucelvax egg-free)    Meds ordered this encounter  Medications   furosemide (LASIX) 20 MG tablet    Sig: Take 1 tablet (20 mg total) by mouth daily as needed (increased  swelling of legs).    Dispense:  30 tablet    Refill:  3    Return in about 4 weeks (around 09/08/2023) for F/U, eval new med, Marlina Cataldi PCP.  [place] Controlled Substance Database was reviewed by me for overdose risk score (ORS)  Time spent:30 Minutes Time spent with patient included reviewing progress notes, labs, imaging studies, and discussing plan for follow up.   This patient was seen by Sallyanne Kuster, FNP-C in collaboration with Dr. Beverely Risen as a part of collaborative care agreement.  Jeanclaude Wentworth R. Tedd Sias, MSN, FNP-C Internal Medicine

## 2023-08-16 ENCOUNTER — Telehealth: Payer: Self-pay | Admitting: Nurse Practitioner

## 2023-08-16 NOTE — Telephone Encounter (Signed)
Received Glucose Monitor Order.Gave to [name] to complete-Toni

## 2023-08-23 ENCOUNTER — Telehealth: Payer: Self-pay | Admitting: Nurse Practitioner

## 2023-08-23 DIAGNOSIS — E118 Type 2 diabetes mellitus with unspecified complications: Secondary | ICD-10-CM | POA: Diagnosis not present

## 2023-08-23 NOTE — Telephone Encounter (Signed)
Error

## 2023-08-24 ENCOUNTER — Ambulatory Visit (INDEPENDENT_AMBULATORY_CARE_PROVIDER_SITE_OTHER): Payer: 59 | Admitting: Podiatry

## 2023-08-24 DIAGNOSIS — M2042 Other hammer toe(s) (acquired), left foot: Secondary | ICD-10-CM

## 2023-08-24 DIAGNOSIS — M79676 Pain in unspecified toe(s): Secondary | ICD-10-CM | POA: Diagnosis not present

## 2023-08-24 DIAGNOSIS — M2041 Other hammer toe(s) (acquired), right foot: Secondary | ICD-10-CM

## 2023-08-24 DIAGNOSIS — E1159 Type 2 diabetes mellitus with other circulatory complications: Secondary | ICD-10-CM | POA: Diagnosis not present

## 2023-08-24 DIAGNOSIS — B351 Tinea unguium: Secondary | ICD-10-CM

## 2023-08-24 NOTE — Progress Notes (Signed)
Subjective:  Patient ID: Chelsey [name], female    DOB: [birth year],  MRN: [id number]  Chelsey [name] presents to clinic today for at risk foot care. Pt has h/o NIDDM with PAD and painful elongated mycotic toenails 1-5 bilaterally which are tender when wearing enclosed shoe gear. Pain is relieved with periodic professional debridement.  Patient would also like to get diabetic shoes as the previous pair has completely worn out.  She denies any other acute complaints. No chief complaint on file.  New problem(s): None.   PCP is Sallyanne Kuster, NP.  Allergies  Allergen Reactions   Oxycodone-Acetaminophen Other (See Comments)    Hallucinations Hallucinations HALLUCINATIONS Other reaction(s): Other (See Comments) HALLUCINATIONS Other reaction(s): Other (See Comments) Hallucinations Hallucinations HALLUCINATIONS Hallucinations Other reaction(s): Other (See Comments), Other (See Comments) HALLUCINATIONS Hallucinations Hallucinations HALLUCINATIONS Other reaction(s): Other (See Comments) HALLUCINATIONS Other reaction(s): Other (See Comments) Hallucinations Hallucinations HALLUCINATIONS Hallucinations Other reaction(s): Other (See Comments) HALLUCINATIONS Other reaction(s): Other (See Comments) Hallucinations Hallucinations HALLUCINATIONS Hallucinations    Indomethacin Other (See Comments)    BURN HOLE IN STOMACH BURN HOLE IN STOMACH Other reaction(s): Other (See Comments), Other (See Comments) BURN HOLE IN STOMACH BURN HOLE IN STOMACH BURN HOLE IN STOMACH BURN HOLE IN STOMACH BURN HOLE IN STOMACH    Penicillins Rash    Review of Systems: Negative except as noted in the HPI.  Objective: No changes noted in today's physical examination. There were no vitals filed for this visit.  Chelsey [name] is a pleasant 72 y.o. female in NAD. AAO x 3.  Vascular Examination: Capillary refill time immediate b/l. Vascular status intact b/l with faintly  palpable pedal pulses. Pedal hair absent b/l. No pain with calf compression b/l. Skin temperature gradient WNL b/l. No cyanosis or clubbing b/l. No ischemia or gangrene noted b/l. No edema noted b/l LE.  Neurological Examination: Protective sensation diminished with 10g monofilament b/l.  Dermatological Examination: Pedal skin with normal turgor, texture and tone b/l.  No open wounds. No interdigital macerations.   Toenails 1-5 b/l thick, discolored, elongated with subungual debris and pain on dorsal palpation.   No hyperkeratotic nor porokeratotic lesions present on today's visit.  Musculoskeletal Examination: Normal muscle strength 5/5 to all lower extremity muscle groups bilaterally. DJD b/l subtalar joint. HAV with bunion deformity noted b/l LE. Pes planus deformity noted bilateral LE.Marland Kitchen No pain, crepitus or joint limitation noted with ROM b/l LE.  Patient ambulates independently without assistive aids.  Bilateral bunion deformities noted semiflexible in nature.  Radiographs: None  Assessment/Plan: 1. Hammertoe, bilateral   2. Pain due to onychomycosis of toenail   3. Type 2 diabetes mellitus with vascular disease (HCC)      -Patient was evaluated and treated. All patient's and/or POA's questions/concerns answered on today's visit. -Continue foot and shoe inspections daily. Monitor blood glucose per PCP/Endocrinologist's recommendations. -Patient to continue soft, supportive shoe gear daily. -Toenails 1-5 bilaterally were debrided in length and girth with sterile nail nippers and dremel. Pinpoint bleeding of L 3rd toe addressed with Lumicain Hemostatic Solution, cleansed with alcohol. triple antibiotic ointment applied. No further treatment required by patient/caregiver. -Patient/POA to call should there be question/concern in the interim.  -Patient will benefit from diabetic shoes given the nature of hammertoe deformity that is present patient is a high risk of developing ulceration  and leading to amputation.  She will be scheduled with the orthotics department for diabetic shoes  No follow-ups on file.  Candelaria Stagers, DPM

## 2023-08-25 ENCOUNTER — Telehealth: Payer: Self-pay

## 2023-08-25 DIAGNOSIS — I739 Peripheral vascular disease, unspecified: Secondary | ICD-10-CM

## 2023-08-25 NOTE — Telephone Encounter (Signed)
Faxed [facility] for diabetic mete

## 2023-08-25 NOTE — Telephone Encounter (Signed)
Patient called and left a message. She called her vascular doctor w/[facility] to schedule an appointment, but they told her she needed a referral from [place]. Please advise Thanks

## 2023-08-25 NOTE — Addendum Note (Signed)
Addended by: Nicholes Rough on: 08/25/2023 12:21 PM   Modules accepted: Orders

## 2023-08-26 NOTE — Telephone Encounter (Signed)
Pt called back and stated she see's Dr Karie Mainland and the phone number is [phone].

## 2023-08-30 ENCOUNTER — Ambulatory Visit: Payer: 59 | Admitting: Podiatry

## 2023-08-30 DIAGNOSIS — Z794 Long term (current) use of insulin: Secondary | ICD-10-CM | POA: Diagnosis not present

## 2023-08-30 DIAGNOSIS — E118 Type 2 diabetes mellitus with unspecified complications: Secondary | ICD-10-CM | POA: Diagnosis not present

## 2023-09-03 ENCOUNTER — Other Ambulatory Visit: Payer: Self-pay | Admitting: Nurse Practitioner

## 2023-09-07 DIAGNOSIS — J449 Chronic obstructive pulmonary disease, unspecified: Secondary | ICD-10-CM | POA: Diagnosis not present

## 2023-09-07 DIAGNOSIS — Z79899 Other long term (current) drug therapy: Secondary | ICD-10-CM | POA: Diagnosis not present

## 2023-09-07 DIAGNOSIS — I10 Essential (primary) hypertension: Secondary | ICD-10-CM | POA: Diagnosis not present

## 2023-09-07 DIAGNOSIS — Z7985 Long-term (current) use of injectable non-insulin antidiabetic drugs: Secondary | ICD-10-CM | POA: Diagnosis not present

## 2023-09-07 DIAGNOSIS — Z7982 Long term (current) use of aspirin: Secondary | ICD-10-CM | POA: Diagnosis not present

## 2023-09-07 DIAGNOSIS — Z95820 Peripheral vascular angioplasty status with implants and grafts: Secondary | ICD-10-CM | POA: Diagnosis not present

## 2023-09-07 DIAGNOSIS — M7989 Other specified soft tissue disorders: Secondary | ICD-10-CM | POA: Diagnosis not present

## 2023-09-07 DIAGNOSIS — E1151 Type 2 diabetes mellitus with diabetic peripheral angiopathy without gangrene: Secondary | ICD-10-CM | POA: Diagnosis not present

## 2023-09-07 DIAGNOSIS — T82856A Stenosis of peripheral vascular stent, initial encounter: Secondary | ICD-10-CM | POA: Diagnosis not present

## 2023-09-07 DIAGNOSIS — Z87891 Personal history of nicotine dependence: Secondary | ICD-10-CM | POA: Diagnosis not present

## 2023-09-07 DIAGNOSIS — Z794 Long term (current) use of insulin: Secondary | ICD-10-CM | POA: Diagnosis not present

## 2023-09-07 DIAGNOSIS — R609 Edema, unspecified: Secondary | ICD-10-CM | POA: Diagnosis not present

## 2023-09-07 DIAGNOSIS — E785 Hyperlipidemia, unspecified: Secondary | ICD-10-CM | POA: Diagnosis not present

## 2023-09-07 DIAGNOSIS — I70219 Atherosclerosis of native arteries of extremities with intermittent claudication, unspecified extremity: Secondary | ICD-10-CM | POA: Diagnosis not present

## 2023-09-08 ENCOUNTER — Ambulatory Visit: Payer: 59 | Admitting: Nurse Practitioner

## 2023-09-08 ENCOUNTER — Encounter: Payer: Self-pay | Admitting: Nurse Practitioner

## 2023-09-08 VITALS — BP 118/68 | HR 73 | Temp 96.1°F | Resp 16 | Ht 63.0 in | Wt 216.4 lb

## 2023-09-08 DIAGNOSIS — Z23 Encounter for immunization: Secondary | ICD-10-CM | POA: Diagnosis not present

## 2023-09-08 DIAGNOSIS — I739 Peripheral vascular disease, unspecified: Secondary | ICD-10-CM | POA: Diagnosis not present

## 2023-09-08 DIAGNOSIS — R600 Localized edema: Secondary | ICD-10-CM | POA: Diagnosis not present

## 2023-09-08 DIAGNOSIS — T82898A Other specified complication of vascular prosthetic devices, implants and grafts, initial encounter: Secondary | ICD-10-CM

## 2023-09-08 DIAGNOSIS — K1379 Other lesions of oral mucosa: Secondary | ICD-10-CM

## 2023-09-08 MED ORDER — LIDOCAINE VISCOUS HCL 2 % MT SOLN
15.0000 mL | Freq: Three times a day (TID) | OROMUCOSAL | 3 refills | Status: DC | PRN
Start: 2023-09-08 — End: 2024-10-10

## 2023-09-08 MED ORDER — ZOSTER VAC RECOMB ADJUVANTED 50 MCG/0.5ML IM SUSR
0.5000 mL | Freq: Once | INTRAMUSCULAR | 0 refills | Status: AC
Start: 2023-09-08 — End: 2023-09-08

## 2023-09-08 MED ORDER — DEXAMETHASONE 0.5 MG/5ML PO SOLN
1.0000 mg | Freq: Four times a day (QID) | ORAL | 0 refills | Status: AC
Start: 2023-09-08 — End: 2023-09-18

## 2023-09-08 NOTE — Progress Notes (Signed)
[facility] [address] [place], [place] [address]  Internal MEDICINE  Office Visit Note  Patient Name: Chelsey [name]  [id number]  [id number]  Date of Service: 09/08/2023  Chief Complaint  Patient presents with   Diabetes   Gastroesophageal Reflux   Hypertension   Follow-up    Eval new med.     HPI Chelsey [name] presents for a follow-up visit for mouth sores, BLE edema, PVD.  Stomatitis -- lidocaine, dexamethasone and chlorhexidine mouthwash are helping. Needs refills.  BLE edema -- improving some with furosemide daily as needed.  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   aspirin EC 81 MG tablet Take 1 tablet (81 mg total) by mouth daily.   atorvastatin (LIPITOR) 10 MG tablet TAKE ONE TABLET BY MOUTH ONCE DAILY FOR cholesterol   celecoxib (CELEBREX) 100 MG capsule TAKE 1 CAPSULE(100 MG) BY MOUTH DAILY   cilostazol (PLETAL) 50 MG tablet Take 1 tablet (50 mg total) by mouth 2 (two) times daily.   dapagliflozin propanediol (FARXIGA) 10 MG TABS tablet Take 1 tablet (10 mg total) by mouth daily.   dexamethasone (DECADRON) 0.5 MG/5ML solution Take 10 mLs (1 mg total) by mouth in the morning, at noon, in the evening, and at bedtime for 10 days. Swish for 2 minutes then spit. Do not swallow.   Dulaglutide 4.5 MG/0.5ML SOPN Inject 4.5 mg into the skin once a week.   ezetimibe (ZETIA) 10 MG tablet Take 1 tablet (10 mg total) by mouth daily.   febuxostat (ULORIC) 40 MG tablet Take 1 tablet (40 mg total) by mouth daily.   FEROSUL 325 (65 Fe) MG tablet TAKE 1 TABLET BY MOUTH DAILY WITH BREAKFAST    fluticasone-salmeterol (ADVAIR) 250-50 MCG/ACT AEPB INHALE 1 PUFF BY MOUTH INTO LUNGS IN THE MORNING and AT BEDTIME   furosemide (LASIX) 20 MG tablet Take 1 tablet (20 mg total) by mouth daily as needed (increased swelling of legs).   gabapentin (NEURONTIN) 300 MG capsule Take 1 capsule (300 mg total) by mouth 3 (three) times daily.   glipiZIDE (GLUCOTROL XL) 2.5 MG 24 hr tablet Take 1 tablet (2.5 mg total) by mouth daily with breakfast.   glucose blood test strip Test sugar 4 x aday for uncontrolled dm on insulin E11.22   HUMULIN 70/30 KWIKPEN (70-30) 100 UNIT/ML KwikPen INJECT 25 UNITS INTO THE SKIN TWICE DAILY   hydroxychloroquine (PLAQUENIL) 200 MG tablet Take 1 tablet (200 mg total) by mouth 2 (two) times daily.   Insulin Pen Needle (COMFORT EZ PEN NEEDLES) 31G X 8 MM MISC Twice a day.   LANCETS ULTRA THIN MISC Apply 1 each topically daily.    levothyroxine (SYNTHROID) 50 MCG tablet TAKE ONE TABLET BY MOUTH EVERY MORNING ON AN EMPTY STOMACH   Olmesartan-amLODIPine-HCTZ 40-10-25 MG TABS Take 1 tablet by mouth daily.   omeprazole (PRILOSEC) 40 MG capsule Take 1 capsule (40 mg total) by mouth daily.   OXYGEN Inhale 3 L into the lungs. Nighttime use   [DISCONTINUED] magic mouthwash (nystatin, lidocaine, diphenhydrAMINE) suspension Take 5 mLs by mouth 3 (three) times daily as needed for mouth pain.   [DISCONTINUED] Zoster  Vaccine Adjuvanted [facility]) injection Inject 0.5 mLs into the muscle once.   chlorhexidine (PERIDEX) 0.12 % solution    lidocaine (XYLOCAINE) 2 % solution Use as directed 15 mLs in the mouth or throat 3 (three) times daily as needed for mouth pain.   Zoster Vaccine Adjuvanted [facility]) injection Inject 0.5 mLs into the muscle once for 1 dose.   [DISCONTINUED] lidocaine (XYLOCAINE) 2 % solution SMARTSIG:By Mouth   No facility-administered encounter medications on file as of 09/08/2023.    Surgical History: Past Surgical History:  Procedure Laterality Date   BRAIN SURGERY   1990's   CATARACT EXTRACTION W/PHACO Left 06/11/2016   Procedure: CATARACT EXTRACTION PHACO AND INTRAOCULAR LENS PLACEMENT (IOC);  Surgeon: Galen Manila, MD;  Location: [facility] ORS;  Service: Ophthalmology;  Laterality: Left;  [place] 1.01 AP% 21.6 CDE 13.23 Fluid pack lot # [id number]   CEREBRAL ANEURYSM REPAIR  1990's   COILS   CORONARY ARTERY BYPASS GRAFT     EYE SURGERY  2000's   bilateral cataract   FRACTURE SURGERY     HERNIA REPAIR  2000   ventral   STENT PLACEMENT VASCULAR ([facility] HX)     VEIN BYPASS SURGERY     VENTRAL HERNIA REPAIR N/A 04/29/2016   Procedure: Laparoscopic HERNIA REPAIR VENTRAL ADULT;  Surgeon: Leafy Ro, MD;  Location: [facility] ORS;  Service: General;  Laterality: N/A;    Medical History: Past Medical History:  Diagnosis Date   Anemia    Aneurysm (HCC)    Arthritis    RHEUMATOID ARTHRITIS   Asthma    Collagen vascular disease (HCC)    COPD (chronic obstructive pulmonary disease) (HCC)    Coronary artery disease    Dementia (HCC)    Diabetes mellitus without complication (HCC)    Edema    FEET/LEGS   GERD (gastroesophageal reflux disease)    Gout    H/O wheezing    History of hiatal hernia    Hypertension    Hypothyroidism    Neuropathy    Oxygen deficiency    HS   Peripheral vascular disease (HCC)    Sarcoidosis of lung (HCC)    Seizures (HCC)    WITH BRAIN 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    Sleep apnea    OXYGEN AT NIGHT 3 L [place]    Family History: Family History  Problem Relation Age of Onset   Heart disease Mother    Hypertension Mother    Diabetes Mother    Diabetes Father    Heart disease Father    Hypertension Father    Breast cancer Maternal Aunt     Social History   Socioeconomic History   Marital status: Single    Spouse name: Not on file   Number of children: Not on file   Years of education: Not on file   Highest education level: Not on file  Occupational History   Not on file  Tobacco Use   Smoking status: Former     Current packs/day: 0.00    Average packs/day: 1 pack/day for 30.0 years (30.0 ttl pk-yrs)    Types: Cigarettes    Start date: 01/28/1976    Quit date: 01/27/2006    Years since quitting: 17.6    Passive exposure: Past   Smokeless tobacco: Never   Tobacco comments:    quit   Substance and Sexual Activity   Alcohol use: No    Alcohol/week: 0.0 standard drinks of alcohol   Drug use: No   Sexual activity:  Not on file  Other Topics Concern   Not on file  Social History Narrative   Not on file   Social Determinants of Health   Financial Resource Strain: Not on file  Food Insecurity: Not on file  Transportation Needs: Not on file  Physical Activity: Not on file  Stress: Not on file  Social Connections: Not on file  Intimate Partner Violence: Not on file      Review of Systems  Constitutional:  Positive for fatigue. Negative for fever.  HENT:  Negative for congestion, mouth sores and postnasal drip.   Respiratory:  Negative for cough.   Cardiovascular:  Positive for leg swelling (improving some). Negative for chest pain.  Genitourinary:  Negative for flank pain.  Musculoskeletal:  Positive for arthralgias and gait problem.  Psychiatric/Behavioral: Negative.      Vital Signs: BP 118/68   Pulse 73   Temp (!) 96.1 F (35.6 C)   Resp 16   Ht 5\' 3"  (1.6 m)   Wt 216 lb 6.4 oz (98.2 kg)   SpO2 93%   BMI 38.33 kg/m    Physical Exam Vitals and nursing note reviewed.  Constitutional:      Appearance: Normal appearance.  HENT:     Head: Normocephalic and atraumatic.  Eyes:     Pupils: Pupils are equal, round, and reactive to light.  Cardiovascular:     Rate and Rhythm: Normal rate and regular rhythm.  Pulmonary:     Effort: Pulmonary effort is normal.     Breath sounds: Normal breath sounds.  Musculoskeletal:     Right lower leg: 1+ Pitting Edema present.     Left lower leg: 1+ Pitting Edema present.     Comments: Chronic left knee pain; min lower extremity swelling   Skin:    General: Skin is warm and dry.  Neurological:     Mental Status: She is alert.  Psychiatric:        Mood and Affect: Mood normal.        Behavior: Behavior normal.        Assessment/Plan: 1. Bilateral lower extremity edema Continue furosemide daily as needed   2. Peripheral vascular disease (HCC) Follow up with [facility] vascular surgery   3. Occlusion of femoroperoneal bypass graft (HCC) Follow up with [facility] vascular surgery, possible surgery to be scheduled in the future  4. Mouth sores Continue lidocaine and dexamethasone solution as prescribed.  - chlorhexidine (PERIDEX) 0.12 % solution - dexamethasone (DECADRON) 0.5 MG/5ML solution; Take 10 mLs (1 mg total) by mouth in the morning, at noon, in the evening, and at bedtime for 10 days. Swish for 2 minutes then spit. Do not swallow.  Dispense: 500 mL; Refill: 0 - lidocaine (XYLOCAINE) 2 % solution; Use as directed 15 mLs in the mouth or throat 3 (three) times daily as needed for mouth pain.  Dispense: 300 mL; Refill: 3  5. Need for vaccination - Zoster Vaccine Adjuvanted [facility]) injection; Inject 0.5 mLs into the muscle once for 1 dose.  Dispense: 0.5 mL; Refill: 0   General Counseling: Lamaria verbalizes understanding of the findings of todays visit and agrees with plan of treatment. I have discussed any further diagnostic evaluation that may be needed or ordered today. We also reviewed her medications today. she has been encouraged to call the office with any questions or concerns that should arise related to todays visit.    No orders of the defined types were placed in this encounter.  Meds ordered this encounter  Medications   Zoster Vaccine Adjuvanted [facility]) injection    Sig: Inject 0.5 mLs into the muscle once for 1 dose.    Dispense:  0.5 mL    Refill:  0   dexamethasone (DECADRON) 0.5 MG/5ML solution    Sig: Take 10 mLs (1 mg total) by mouth in the morning, at noon, in the evening, and at bedtime for  10 days. Swish for 2 minutes then spit. Do not swallow.    Dispense:  500 mL    Refill:  0   lidocaine (XYLOCAINE) 2 % solution    Sig: Use as directed 15 mLs in the mouth or throat 3 (three) times daily as needed for mouth pain.    Dispense:  300 mL    Refill:  3    Return in about 3 months (around 12/09/2023) for F/U, Briteny Fulghum PCP.   Total time spent:30 Minutes Time spent includes review of chart, medications, test results, and follow up plan with the patient.   [place] Controlled Substance Database was reviewed by me.  This patient was seen by Sallyanne Kuster, FNP-C in collaboration with Dr. Beverely Risen as a part of collaborative care agreement.   Detron Carras R. Tedd Sias, MSN, FNP-C Internal medicine

## 2023-09-16 ENCOUNTER — Telehealth: Payer: Self-pay

## 2023-09-16 NOTE — Telephone Encounter (Signed)
Patient is following up. She states she has no cardiac history and has never been previously seen by a cardiologist.

## 2023-09-16 NOTE — Telephone Encounter (Signed)
The patient is scheduled to see Debbe Odea, MD on 09-27-2023 as a new patient. Called and left a voice message to inquire about any previous cardiac history. Requested the patient to call back at their earliest convenience.

## 2023-09-17 ENCOUNTER — Other Ambulatory Visit: Payer: 59

## 2023-09-21 DIAGNOSIS — E118 Type 2 diabetes mellitus with unspecified complications: Secondary | ICD-10-CM | POA: Diagnosis not present

## 2023-09-24 DIAGNOSIS — Z7951 Long term (current) use of inhaled steroids: Secondary | ICD-10-CM | POA: Diagnosis not present

## 2023-09-24 DIAGNOSIS — E119 Type 2 diabetes mellitus without complications: Secondary | ICD-10-CM | POA: Diagnosis not present

## 2023-09-24 DIAGNOSIS — K148 Other diseases of tongue: Secondary | ICD-10-CM | POA: Diagnosis not present

## 2023-09-24 DIAGNOSIS — J449 Chronic obstructive pulmonary disease, unspecified: Secondary | ICD-10-CM | POA: Diagnosis not present

## 2023-09-24 DIAGNOSIS — K1379 Other lesions of oral mucosa: Secondary | ICD-10-CM | POA: Diagnosis not present

## 2023-09-24 DIAGNOSIS — Z7985 Long-term (current) use of injectable non-insulin antidiabetic drugs: Secondary | ICD-10-CM | POA: Diagnosis not present

## 2023-09-24 DIAGNOSIS — M79606 Pain in leg, unspecified: Secondary | ICD-10-CM | POA: Diagnosis not present

## 2023-09-24 DIAGNOSIS — Z79899 Other long term (current) drug therapy: Secondary | ICD-10-CM | POA: Diagnosis not present

## 2023-09-24 DIAGNOSIS — I771 Stricture of artery: Secondary | ICD-10-CM | POA: Diagnosis not present

## 2023-09-24 DIAGNOSIS — I10 Essential (primary) hypertension: Secondary | ICD-10-CM | POA: Diagnosis not present

## 2023-09-24 DIAGNOSIS — Z87891 Personal history of nicotine dependence: Secondary | ICD-10-CM | POA: Diagnosis not present

## 2023-09-24 DIAGNOSIS — M109 Gout, unspecified: Secondary | ICD-10-CM | POA: Diagnosis not present

## 2023-09-24 DIAGNOSIS — N281 Cyst of kidney, acquired: Secondary | ICD-10-CM | POA: Diagnosis not present

## 2023-09-24 DIAGNOSIS — Z794 Long term (current) use of insulin: Secondary | ICD-10-CM | POA: Diagnosis not present

## 2023-09-24 DIAGNOSIS — I739 Peripheral vascular disease, unspecified: Secondary | ICD-10-CM | POA: Diagnosis not present

## 2023-09-27 ENCOUNTER — Encounter: Payer: Self-pay | Admitting: Cardiology

## 2023-09-27 ENCOUNTER — Ambulatory Visit: Payer: 59 | Attending: Cardiology | Admitting: Cardiology

## 2023-09-27 ENCOUNTER — Encounter: Payer: Self-pay | Admitting: Nurse Practitioner

## 2023-09-27 VITALS — BP 114/62 | HR 69 | Ht 65.0 in | Wt 219.8 lb

## 2023-09-27 DIAGNOSIS — R600 Localized edema: Secondary | ICD-10-CM | POA: Diagnosis not present

## 2023-09-27 DIAGNOSIS — I10 Essential (primary) hypertension: Secondary | ICD-10-CM | POA: Diagnosis not present

## 2023-09-27 DIAGNOSIS — I739 Peripheral vascular disease, unspecified: Secondary | ICD-10-CM

## 2023-09-27 MED ORDER — FUROSEMIDE 20 MG PO TABS
20.0000 mg | ORAL_TABLET | Freq: Every day | ORAL | 3 refills | Status: DC | PRN
Start: 2023-09-27 — End: 2024-03-15

## 2023-09-27 NOTE — Patient Instructions (Signed)
Medication Instructions:   START Lasix - Take one tablet ( 20mg ) as needed for leg swelling.   *If you need a refill on your cardiac medications before your next appointment, please call your pharmacy*   Lab Work:  None Ordered  If you have labs (blood work) drawn today and your tests are completely normal, you will receive your results only by: MyChart Message (if you have MyChart) OR A paper copy in the mail If you have any lab test that is abnormal or we need to change your treatment, we will call you to review the results.   Testing/Procedures:  Your physician has requested that you have an echocardiogram. Echocardiography is a painless test that uses sound waves to create images of your heart. It provides your doctor with information about the size and shape of your heart and how well your heart's chambers and valves are working. This procedure takes approximately one hour. There are no restrictions for this procedure. Please do NOT wear cologne, perfume, aftershave, or lotions (deodorant is allowed). Please arrive 15 minutes prior to your appointment time.  Please note: We ask at that you not bring children with you during ultrasound (echo/ vascular) testing. Due to room size and safety concerns, children are not allowed in the ultrasound rooms during exams. Our front office staff cannot provide observation of children in our lobby area while testing is being conducted. An adult accompanying a patient to their appointment will only be allowed in the ultrasound room at the discretion of the ultrasound technician under special circumstances. We apologize for any inconvenience.    Follow-Up: At [facility], you and your health needs are our priority.  As part of our continuing mission to provide you with exceptional heart care, we have created designated Provider Care Teams.  These Care Teams include your primary Cardiologist (physician) and Advanced Practice Providers (APPs  -  Physician Assistants and Nurse Practitioners) who all work together to provide you with the care you need, when you need it.  We recommend signing up for the patient portal called "MyChart".  Sign up information is provided on this After Visit Summary.  MyChart is used to connect with patients for Virtual Visits (Telemedicine).  Patients are able to view lab/test results, encounter notes, upcoming appointments, etc.  Non-urgent messages can be sent to your provider as well.   To learn more about what you can do with MyChart, go to [email or web address].    Your next appointment:    After Echocardiogram  Provider:   You may see Debbe Odea, MD or one of the following Advanced Practice Providers on your designated Care Team:   Nicolasa Ducking, NP Eula Listen, PA-C Cadence Fransico Michael, PA-C Charlsie Quest, NP Carlos Levering, NP

## 2023-09-27 NOTE — Progress Notes (Signed)
Cardiology Office Note:    Date:  09/27/2023   ID:  Chelsey [name] [place], [place] 10-27-1951, MRN [id number]  PCP:  Sallyanne Kuster, NP   [facility] Providers Cardiologist:  Debbe Odea, MD     Referring MD: Sallyanne Kuster, NP   Chief Complaint  Patient presents with   New Patient (Initial Visit)    Referred for cardiac evaluation of bilateral lower extremity edema, peripheral vascular disease, and essential hypertension.  Patient reports leg symptoms of PVD.    History of Present Illness:    [place] is a 72 y.o. female with a hx of hypertension, diabetes, former smoker x 30+ years, PAD (s/p right SFA stents-occluded, left CFA-P OP BPG occluded), who presents due to leg edema.  Hospice chronic claudication due to PAD, follows up with [facility] vascular team.  States having leg edema which worsens as the day progresses.  Denies chest pain, previously had shortness of breath when she was smoking, this is improved with cigarette cessation.  Past Medical History:  Diagnosis Date   Anemia    Aneurysm (HCC)    Arthritis    RHEUMATOID ARTHRITIS   Asthma    Collagen vascular disease (HCC)    COPD (chronic obstructive pulmonary disease) (HCC)    Coronary artery disease    Dementia (HCC)    Diabetes mellitus without complication (HCC)    Edema    FEET/LEGS   GERD (gastroesophageal reflux disease)    Gout    H/O wheezing    History of hiatal hernia    Hypertension    Hypothyroidism    Neuropathy    Oxygen deficiency    HS   Peripheral vascular disease (HCC)    Sarcoidosis of lung (HCC)    Seizures (HCC)    WITH BRAIN 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    Sleep apnea    OXYGEN AT NIGHT 3 L [place]    Past Surgical History:  Procedure Laterality Date   BRAIN SURGERY  1990's   CATARACT EXTRACTION W/PHACO Left 06/11/2016   Procedure: CATARACT EXTRACTION PHACO AND INTRAOCULAR LENS PLACEMENT (IOC);  Surgeon: Galen Manila, MD;  Location: [facility] ORS;  Service:  Ophthalmology;  Laterality: Left;  [place] 1.01 AP% 21.6 CDE 13.23 Fluid pack lot # [id number]   CEREBRAL ANEURYSM REPAIR  1990's   COILS   CORONARY ARTERY BYPASS GRAFT     EYE SURGERY  2000's   bilateral cataract   FRACTURE SURGERY     HERNIA REPAIR  2000   ventral   STENT PLACEMENT VASCULAR ([facility] HX)     VEIN BYPASS SURGERY     VENTRAL HERNIA REPAIR N/A 04/29/2016   Procedure: Laparoscopic HERNIA REPAIR VENTRAL ADULT;  Surgeon: Leafy Ro, MD;  Location: [facility] ORS;  Service: General;  Laterality: N/A;    Current Medications: Current Meds  Medication Sig   albuterol (VENTOLIN HFA) 108 (90 Base) MCG/ACT inhaler Inhale 2 puffs into the lungs every 6 (six) hours as needed for wheezing or shortness of breath.   aspirin EC 81 MG tablet Take 1 tablet (81 mg total) by mouth daily.   atorvastatin (LIPITOR) 10 MG tablet TAKE ONE TABLET BY MOUTH ONCE DAILY FOR cholesterol   celecoxib (CELEBREX) 100 MG capsule TAKE 1 CAPSULE(100 MG) BY MOUTH DAILY   chlorhexidine (PERIDEX) 0.12 % solution    cilostazol (PLETAL) 50 MG tablet Take 1 tablet (50 mg total) by mouth 2 (two) times daily.   dapagliflozin propanediol (FARXIGA) 10 MG TABS tablet Take 1  tablet (10 mg total) by mouth daily.   Dulaglutide 4.5 MG/0.5ML SOPN Inject 4.5 mg into the skin once a week.   ezetimibe (ZETIA) 10 MG tablet Take 1 tablet (10 mg total) by mouth daily.   febuxostat (ULORIC) 40 MG tablet Take 1 tablet (40 mg total) by mouth daily.   FEROSUL 325 (65 Fe) MG tablet TAKE 1 TABLET BY MOUTH DAILY WITH BREAKFAST   fluticasone-salmeterol (ADVAIR) 250-50 MCG/ACT AEPB INHALE 1 PUFF BY MOUTH INTO LUNGS IN THE MORNING and AT BEDTIME   gabapentin (NEURONTIN) 300 MG capsule Take 1 capsule (300 mg total) by mouth 3 (three) times daily.   glipiZIDE (GLUCOTROL XL) 2.5 MG 24 hr tablet Take 1 tablet (2.5 mg total) by mouth daily with breakfast.   glucose blood test strip Test sugar 4 x aday for uncontrolled dm on insulin E11.22   HUMULIN  70/30 KWIKPEN (70-30) 100 UNIT/ML KwikPen INJECT 25 UNITS INTO THE SKIN TWICE DAILY   hydroxychloroquine (PLAQUENIL) 200 MG tablet Take 1 tablet (200 mg total) by mouth 2 (two) times daily.   Insulin Pen Needle (COMFORT EZ PEN NEEDLES) 31G X 8 MM MISC Twice a day.   LANCETS ULTRA THIN MISC Apply 1 each topically daily.    levothyroxine (SYNTHROID) 50 MCG tablet TAKE ONE TABLET BY MOUTH EVERY MORNING ON AN EMPTY STOMACH   lidocaine (XYLOCAINE) 2 % solution Use as directed 15 mLs in the mouth or throat 3 (three) times daily as needed for mouth pain.   Olmesartan-amLODIPine-HCTZ 40-10-25 MG TABS Take 1 tablet by mouth daily.   omeprazole (PRILOSEC) 40 MG capsule Take 1 capsule (40 mg total) by mouth daily.   OXYGEN Inhale 3 L into the lungs. Nighttime use   [DISCONTINUED] furosemide (LASIX) 20 MG tablet Take 1 tablet (20 mg total) by mouth daily as needed (increased swelling of legs).     Allergies:   Oxycodone-acetaminophen, Indomethacin, and Penicillins   Social History   Socioeconomic History   Marital status: Single    Spouse name: Not on file   Number of children: Not on file   Years of education: Not on file   Highest education level: Not on file  Occupational History   Not on file  Tobacco Use   Smoking status: Former    Current packs/day: 0.00    Average packs/day: 1 pack/day for 30.0 years (30.0 ttl pk-yrs)    Types: Cigarettes    Start date: 01/28/1976    Quit date: 01/27/2006    Years since quitting: 17.6    Passive exposure: Past   Smokeless tobacco: Never   Tobacco comments:    quit   Vaping Use   Vaping status: Never Used  Substance and Sexual Activity   Alcohol use: No    Alcohol/week: 0.0 standard drinks of alcohol   Drug use: No   Sexual activity: Not on file  Other Topics Concern   Not on file  Social History Narrative   Not on file   Social Determinants of Health   Financial Resource Strain: Not on file  Food Insecurity: Not on file  Transportation  Needs: Not on file  Physical Activity: Not on file  Stress: Not on file  Social Connections: Not on file     Family History: The patient's family history includes Breast cancer in her maternal aunt; Diabetes in her father and mother; Heart disease in her father and mother; Hypertension in her father and mother.  ROS:   Please see the history  of present illness.     All other systems reviewed and are negative.  EKGs/Labs/Other Studies Reviewed:    The following studies were reviewed today:  EKG Interpretation Date/Time:  Monday September 27 2023 09:19:28 EST Ventricular Rate:  69 PR Interval:  190 QRS Duration:  86 QT Interval:  404 QTC Calculation: 432 R Axis:   8  Text Interpretation: Normal sinus rhythm Low voltage QRS Confirmed by Debbe Odea ([id number]) on 09/27/2023 9:37:30 AM    Recent Labs: 02/20/2023: Hemoglobin 13.4; Platelets 195 03/04/2023: ALT 16; BUN 42; Potassium 5.1; Sodium 139 04/07/2023: Creatinine, Ser 1.90  Recent Lipid Panel    Component Value Date/Time   CHOL 135 11/20/2019 0838   TRIG 239 (H) 11/20/2019 0838   HDL 32 (L) 11/20/2019 0838   LDLCALC 64 11/20/2019 0838     Risk Assessment/Calculations:             Physical Exam:    VS:  BP 114/62 (BP Location: Left Arm, Patient Position: Sitting, Cuff Size: Large)   Pulse 69   Ht 5\' 5"  (1.651 m)   Wt 219 lb 12.8 oz (99.7 kg)   SpO2 96%   BMI 36.58 kg/m     Wt Readings from Last 3 Encounters:  09/27/23 219 lb 12.8 oz (99.7 kg)  09/08/23 216 lb 6.4 oz (98.2 kg)  08/11/23 224 lb (101.6 kg)     GEN:  Well nourished, well developed in no acute distress HEENT: Normal NECK: No JVD; No carotid bruits CARDIAC: RRR, no murmurs, rubs, gallops RESPIRATORY: Decreased breath sounds bilaterally, no wheezing. ABDOMEN: Soft, non-tender, non-distended MUSCULOSKELETAL:  No edema; No deformity  SKIN: Warm and dry NEUROLOGIC:  Alert and oriented x 3 PSYCHIATRIC:  Normal affect   ASSESSMENT:    1.  Leg edema   2. Primary hypertension   3. PAD (peripheral artery disease) (HCC)   4. Bilateral lower extremity edema   5. Essential hypertension    PLAN:    In order of problems listed above:  Bilateral leg edema, appears dependent, likely from venous insufficiency.  Compression stockings, keeping legs in raised position advised.  Obtain echo to rule out any structural abnormalities.  Okay for Lasix 20 mg daily as needed.   Hypertension, BP controlled.  Continue olmesartan 40-amlodipine 10-HCTZ 25. PAD, chronic claudication, continue aspirin, cilostazol, Lipitor 10 mg daily.  Follows up with vascular surgery at [facility].  Follow-up in 6 weeks.      Medication Adjustments/Labs and Tests Ordered: Current medicines are reviewed at length with the patient today.  Concerns regarding medicines are outlined above.  Orders Placed This Encounter  Procedures   EKG 12-Lead   ECHOCARDIOGRAM COMPLETE   Meds ordered this encounter  Medications   furosemide (LASIX) 20 MG tablet    Sig: Take 1 tablet (20 mg total) by mouth daily as needed (increased swelling of legs).    Dispense:  30 tablet    Refill:  3    Patient Instructions  Medication Instructions:   START Lasix - Take one tablet ( 20mg ) as needed for leg swelling.   *If you need a refill on your cardiac medications before your next appointment, please call your pharmacy*   Lab Work:  None Ordered  If you have labs (blood work) drawn today and your tests are completely normal, you will receive your results only by: MyChart Message (if you have MyChart) OR A paper copy in the mail If you have any lab test that is abnormal or  we need to change your treatment, we will call you to review the results.   Testing/Procedures:  Your physician has requested that you have an echocardiogram. Echocardiography is a painless test that uses sound waves to create images of your heart. It provides your doctor with information about the size and  shape of your heart and how well your heart's chambers and valves are working. This procedure takes approximately one hour. There are no restrictions for this procedure. Please do NOT wear cologne, perfume, aftershave, or lotions (deodorant is allowed). Please arrive 15 minutes prior to your appointment time.  Please note: We ask at that you not bring children with you during ultrasound (echo/ vascular) testing. Due to room size and safety concerns, children are not allowed in the ultrasound rooms during exams. Our front office staff cannot provide observation of children in our lobby area while testing is being conducted. An adult accompanying a patient to their appointment will only be allowed in the ultrasound room at the discretion of the ultrasound technician under special circumstances. We apologize for any inconvenience.    Follow-Up: At [facility], you and your health needs are our priority.  As part of our continuing mission to provide you with exceptional heart care, we have created designated Provider Care Teams.  These Care Teams include your primary Cardiologist (physician) and Advanced Practice Providers (APPs -  Physician Assistants and Nurse Practitioners) who all work together to provide you with the care you need, when you need it.  We recommend signing up for the patient portal called "MyChart".  Sign up information is provided on this After Visit Summary.  MyChart is used to connect with patients for Virtual Visits (Telemedicine).  Patients are able to view lab/test results, encounter notes, upcoming appointments, etc.  Non-urgent messages can be sent to your provider as well.   To learn more about what you can do with MyChart, go to [email or web address].    Your next appointment:    After Echocardiogram  Provider:   You may see Debbe Odea, MD or one of the following Advanced Practice Providers on your designated Care Team:   Nicolasa Ducking, NP Eula Listen, PA-C Cadence Fransico Michael, PA-C Charlsie Quest, NP Carlos Levering, NP   Signed, Debbe Odea, MD  09/27/2023 10:25 AM    [facility]

## 2023-09-29 DIAGNOSIS — E118 Type 2 diabetes mellitus with unspecified complications: Secondary | ICD-10-CM | POA: Diagnosis not present

## 2023-09-29 DIAGNOSIS — Z794 Long term (current) use of insulin: Secondary | ICD-10-CM | POA: Diagnosis not present

## 2023-10-01 ENCOUNTER — Ambulatory Visit: Payer: 59

## 2023-10-01 DIAGNOSIS — E1159 Type 2 diabetes mellitus with other circulatory complications: Secondary | ICD-10-CM

## 2023-10-01 DIAGNOSIS — I739 Peripheral vascular disease, unspecified: Secondary | ICD-10-CM

## 2023-10-01 DIAGNOSIS — M2041 Other hammer toe(s) (acquired), right foot: Secondary | ICD-10-CM

## 2023-10-01 NOTE — Progress Notes (Signed)
Patient presents to the office today for diabetic shoe and insole measuring.  Patient was measured with brannock device to determine size and width for 1 pair of extra depth shoes and foam casted for 3 pair of insoles.   Documentation of medical necessity will be sent to patient's treating diabetic doctor to verify and sign.   Patient's diabetic provider: Elliot Gurney   Shoes and insoles will be ordered at that time and patient will be notified for an appointment for fitting when they arrive.   Shoe size (per patient): 10 Brannock measurement: 10 Patient shoe selection- Shoe choice:   [id number] / brooks white 2nd choice   Shoe size ordered: 10XWD Financials signed  Addison [name] Cped, CFo, CFm

## 2023-10-04 ENCOUNTER — Telehealth: Payer: Self-pay | Admitting: Cardiology

## 2023-10-04 NOTE — Telephone Encounter (Signed)
   Pre-operative Risk Assessment    Patient Name: Norine Skorupski  DOB: 07/20/1951 MRN: [id number]      Request for Surgical Clearance    Procedure:   probable redo bypass on left leg and new bypass on right leg  Date of Surgery:  Clearance TBD                                 Surgeon:  Dr Sherrie Sport Surgeon's Group or Practice Name:  [facility] Phone number:  [phone] Fax number:  [phone]   Type of Clearance Requested:   - Medical    Type of Anesthesia:  General    Additional requests/questions:    SignedNorman Herrlich   10/04/2023, 11:33 AM

## 2023-10-04 NOTE — Telephone Encounter (Signed)
Good Morning Dr. Azucena Cecil  We have received a surgical clearance request for Chelsey [name] for possible redo femoropopliteal bypass. They were seen recently in clinic on 09/27/2023.  She has a PMH of PAD, HTN, diabetes, former smoker.  Can you please comment on surgical clearance for upcoming procedure. Please forward you guidance and recommendations to P CV DIV PREOP   Thank you, Robin Searing, NP

## 2023-10-05 ENCOUNTER — Telehealth: Payer: Self-pay | Admitting: Nurse Practitioner

## 2023-10-05 NOTE — Telephone Encounter (Addendum)
Received surgical clearance from [facility]. @ front desk for 10/07/23 appointment-Toni

## 2023-10-07 ENCOUNTER — Ambulatory Visit (INDEPENDENT_AMBULATORY_CARE_PROVIDER_SITE_OTHER): Payer: 59 | Admitting: Nurse Practitioner

## 2023-10-07 ENCOUNTER — Encounter: Payer: Self-pay | Admitting: Nurse Practitioner

## 2023-10-07 ENCOUNTER — Telehealth: Payer: Self-pay | Admitting: Nurse Practitioner

## 2023-10-07 VITALS — BP 120/69 | HR 71 | Temp 97.4°F | Resp 16 | Ht 63.0 in | Wt 219.4 lb

## 2023-10-07 DIAGNOSIS — M1A09X0 Idiopathic chronic gout, multiple sites, without tophus (tophi): Secondary | ICD-10-CM | POA: Diagnosis not present

## 2023-10-07 DIAGNOSIS — M1712 Unilateral primary osteoarthritis, left knee: Secondary | ICD-10-CM | POA: Diagnosis not present

## 2023-10-07 DIAGNOSIS — E1165 Type 2 diabetes mellitus with hyperglycemia: Secondary | ICD-10-CM | POA: Diagnosis not present

## 2023-10-07 DIAGNOSIS — Z01818 Encounter for other preprocedural examination: Secondary | ICD-10-CM

## 2023-10-07 DIAGNOSIS — I739 Peripheral vascular disease, unspecified: Secondary | ICD-10-CM | POA: Diagnosis not present

## 2023-10-07 DIAGNOSIS — Z794 Long term (current) use of insulin: Secondary | ICD-10-CM

## 2023-10-07 DIAGNOSIS — T82898A Other specified complication of vascular prosthetic devices, implants and grafts, initial encounter: Secondary | ICD-10-CM

## 2023-10-07 NOTE — Telephone Encounter (Signed)
Surgical clearance faxed to [facility]; [phone]. Scanned-Toni

## 2023-10-07 NOTE — Progress Notes (Addendum)
[facility] [address] [place], [place] [address]  Internal MEDICINE  Office Visit Note  Patient Name: Chelsey [name]  [id number]  [id number]  Date of Service: 10/20/2023  Chief Complaint  Patient presents with   Diabetes   Gastroesophageal Reflux   Hypertension   Follow-up    Surgical clearance     HPI Latanga presents for a follow-up visit for surgical clearance for vascular surgery for both legs  Having vascular surgery on both legs. No date set yet.  Has been cleared by cardiology as long as EF is normal on echocardiogram that is scheduled for 10/11/23.  Denies any signs of infection Denies any cough, SOB, wheezing, chest tightness, fatigue or other issues.  Denies any difficulty breathing or chest pains.  Has elevated A1c at 11.2, her diabetes is managed by [facility] endocrinology.  --requesting a lift chair to help with getting in and out of a seated position. Due to arthritis and vascular issues in her legs, this movement is difficult for her.   Current Medication: Outpatient Encounter Medications as of 10/07/2023  Medication Sig   albuterol (VENTOLIN HFA) 108 (90 Base) MCG/ACT inhaler Inhale 2 puffs into the lungs every 6 (six) hours as needed for wheezing or shortness of breath.   aspirin EC 81 MG tablet Take 1 tablet (81 mg total) by mouth daily.   atorvastatin (LIPITOR) 10 MG tablet TAKE ONE TABLET BY MOUTH ONCE DAILY FOR cholesterol   chlorhexidine (PERIDEX) 0.12 % solution    cilostazol (PLETAL) 50 MG tablet Take 1 tablet (50 mg total) by mouth 2 (two) times daily.   dapagliflozin propanediol (FARXIGA) 10 MG TABS tablet Take 1 tablet (10 mg total) by mouth daily.   Dulaglutide 4.5 MG/0.5ML SOPN Inject 4.5 mg into the skin once a week.   ezetimibe (ZETIA) 10 MG tablet Take 1 tablet (10 mg total) by mouth daily.   febuxostat (ULORIC) 40 MG tablet Take 1 tablet (40 mg total) by mouth daily.   FEROSUL 325 (65 Fe) MG tablet TAKE 1 TABLET BY MOUTH DAILY WITH  BREAKFAST   fluticasone-salmeterol (ADVAIR) 250-50 MCG/ACT AEPB INHALE 1 PUFF BY MOUTH INTO LUNGS IN THE MORNING and AT BEDTIME   furosemide (LASIX) 20 MG tablet Take 1 tablet (20 mg total) by mouth daily as needed (increased swelling of legs).   gabapentin (NEURONTIN) 300 MG capsule Take 1 capsule (300 mg total) by mouth 3 (three) times daily.   glipiZIDE (GLUCOTROL XL) 2.5 MG 24 hr tablet Take 1 tablet (2.5 mg total) by mouth daily with breakfast.   glucose blood test strip Test sugar 4 x aday for uncontrolled dm on insulin E11.22   HUMULIN 70/30 KWIKPEN (70-30) 100 UNIT/ML KwikPen INJECT 25 UNITS INTO THE SKIN TWICE DAILY   hydroxychloroquine (PLAQUENIL) 200 MG tablet Take 1 tablet (200 mg total) by mouth 2 (two) times daily.   Insulin Pen Needle (COMFORT EZ PEN NEEDLES) 31G X 8 MM MISC Twice a day.   LANCETS ULTRA THIN MISC Apply 1 each topically daily.    levothyroxine (SYNTHROID) 50 MCG tablet TAKE ONE TABLET BY MOUTH EVERY MORNING ON AN EMPTY STOMACH   lidocaine (XYLOCAINE) 2 % solution Use as directed 15 mLs in the mouth or throat 3 (three) times daily as needed for mouth pain.   Olmesartan-amLODIPine-HCTZ 40-10-25 MG TABS Take 1 tablet by mouth daily.   omeprazole (PRILOSEC) 40 MG capsule Take 1 capsule (40 mg total) by mouth daily.   OXYGEN Inhale 3 L  into the lungs. Nighttime use   [DISCONTINUED] celecoxib (CELEBREX) 100 MG capsule TAKE 1 CAPSULE(100 MG) BY MOUTH DAILY   No facility-administered encounter medications on file as of 10/07/2023.    Surgical History: Past Surgical History:  Procedure Laterality Date   BRAIN SURGERY  1990's   CATARACT EXTRACTION W/PHACO Left 06/11/2016   Procedure: CATARACT EXTRACTION PHACO AND INTRAOCULAR LENS PLACEMENT (IOC);  Surgeon: Galen Manila, MD;  Location: [facility] ORS;  Service: Ophthalmology;  Laterality: Left;  [place] 1.01 AP% 21.6 CDE 13.23 Fluid pack lot # [id number]   CEREBRAL ANEURYSM REPAIR  1990's   COILS   CORONARY ARTERY BYPASS  GRAFT     EYE SURGERY  2000's   bilateral cataract   FRACTURE SURGERY     HERNIA REPAIR  2000   ventral   STENT PLACEMENT VASCULAR ([facility] HX)     VEIN BYPASS SURGERY     VENTRAL HERNIA REPAIR N/A 04/29/2016   Procedure: Laparoscopic HERNIA REPAIR VENTRAL ADULT;  Surgeon: Leafy Ro, MD;  Location: [facility] ORS;  Service: General;  Laterality: N/A;    Medical History: Past Medical History:  Diagnosis Date   Anemia    Aneurysm (HCC)    Arthritis    RHEUMATOID ARTHRITIS   Asthma    Collagen vascular disease (HCC)    COPD (chronic obstructive pulmonary disease) (HCC)    Coronary artery disease    Dementia (HCC)    Diabetes mellitus without complication (HCC)    Edema    FEET/LEGS   GERD (gastroesophageal reflux disease)    Gout    H/O wheezing    History of hiatal hernia    Hypertension    Hypothyroidism    Neuropathy    Oxygen deficiency    HS   Peripheral vascular disease (HCC)    Sarcoidosis of lung (HCC)    Seizures (HCC)    WITH BRAIN 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    Sleep apnea    OXYGEN AT NIGHT 3 L [place]    Family History: Family History  Problem Relation Age of Onset   Heart disease Mother    Hypertension Mother    Diabetes Mother    Diabetes Father    Heart disease Father    Hypertension Father    Breast cancer Maternal Aunt     Social History   Socioeconomic History   Marital status: Single    Spouse name: Not on file   Number of children: Not on file   Years of education: Not on file   Highest education level: Not on file  Occupational History   Not on file  Tobacco Use   Smoking status: Former    Current packs/day: 0.00    Average packs/day: 1 pack/day for 30.0 years (30.0 ttl pk-yrs)    Types: Cigarettes    Start date: 01/28/1976    Quit date: 01/27/2006    Years since quitting: 17.7    Passive exposure: Past   Smokeless tobacco: Never   Tobacco comments:    quit   Vaping Use   Vaping status: Never Used  Substance and Sexual Activity    Alcohol use: No    Alcohol/week: 0.0 standard drinks of alcohol   Drug use: No   Sexual activity: Not on file  Other Topics Concern   Not on file  Social History Narrative   Not on file   Social Determinants of Health   Financial Resource Strain: Not on file  Food Insecurity: Not on file  Transportation  Needs: Not on file  Physical Activity: Not on file  Stress: Not on file  Social Connections: Not on file  Intimate Partner Violence: Not on file      Review of Systems  Constitutional:  Positive for fatigue. Negative for fever.  HENT:  Negative for congestion, mouth sores and postnasal drip.   Respiratory:  Negative for cough.   Cardiovascular:  Positive for leg swelling. Negative for chest pain.  Genitourinary:  Negative for flank pain.  Musculoskeletal:  Positive for arthralgias and gait problem.  Psychiatric/Behavioral: Negative.      Vital Signs: BP 120/69   Pulse 71   Temp (!) 97.4 F (36.3 C)   Resp 16   Ht 5\' 3"  (1.6 m)   Wt 219 lb 6.4 oz (99.5 kg)   SpO2 94%   BMI 38.86 kg/m    Physical Exam Vitals and nursing note reviewed.  Constitutional:      General: She is not in acute distress.    Appearance: Normal appearance. She is obese. She is not ill-appearing.  HENT:     Head: Normocephalic and atraumatic.  Eyes:     Pupils: Pupils are equal, round, and reactive to light.  Cardiovascular:     Rate and Rhythm: Normal rate and regular rhythm.     Heart sounds: Normal heart sounds. No murmur heard. Pulmonary:     Effort: Pulmonary effort is normal. No respiratory distress.     Breath sounds: Normal breath sounds. No wheezing.  Abdominal:     General: Bowel sounds are normal.     Palpations: Abdomen is soft.  Musculoskeletal:     Right lower leg: 2+ Pitting Edema present.     Left lower leg: 2+ Pitting Edema present.     Comments: Chronic left knee pain; min lower extremity swelling  Skin:    General: Skin is warm and dry.  Neurological:      Mental Status: She is alert and oriented to person, place, and time.  Psychiatric:        Mood and Affect: Mood normal.        Behavior: Behavior normal.        Assessment/Plan: 1. Occlusion of femoroperoneal bypass graft (HCC) Plan for surgery to fix graft and a new bypass on the other leg.  DME order for a lift chair  2. Peripheral vascular disease (HCC) Sees vascular surgery  3. Type 2 diabetes mellitus with hyperglycemia, with long-term current use of insulin (HCC) A1c was very high in September at 11.2, managed by [facility] endocrinology.   4. Preoperative clearance Cleared medically except that it is recommended that cardiology discuss clearance related to her diabetes with [facility] endocrinology.  5. Idiopathic chronic gout of multiple sites without tophus DME order for a lift chair - For home use only DME Other see comment  6. Osteoarthritis of left knee, unspecified osteoarthritis type DME order for a lift chair  - For home use only DME Other see comment  General Counseling: Versie verbalizes understanding of the findings of todays visit and agrees with plan of treatment. I have discussed any further diagnostic evaluation that may be needed or ordered today. We also reviewed her medications today. she has been encouraged to call the office with any questions or concerns that should arise related to todays visit.    Orders Placed This Encounter  Procedures   For home use only DME Other see comment    No orders of the defined types were placed in  this encounter.   Return for has appt scheduled for january.   Total time spent:30 Minutes Time spent includes review of chart, medications, test results, and follow up plan with the patient.   [place] Controlled Substance Database was reviewed by me.  This patient was seen by Sallyanne Kuster, FNP-C in collaboration with Dr. Beverely Risen as a part of collaborative care agreement.   Cori Henningsen R. Tedd Sias, MSN, FNP-C Internal medicine

## 2023-10-08 ENCOUNTER — Telehealth: Payer: Self-pay

## 2023-10-08 ENCOUNTER — Other Ambulatory Visit: Payer: Self-pay | Admitting: Internal Medicine

## 2023-10-08 ENCOUNTER — Other Ambulatory Visit: Payer: Self-pay

## 2023-10-08 DIAGNOSIS — Z79899 Other long term (current) drug therapy: Secondary | ICD-10-CM

## 2023-10-08 MED ORDER — CELECOXIB 100 MG PO CAPS
ORAL_CAPSULE | ORAL | 1 refills | Status: DC
Start: 2023-10-08 — End: 2023-12-16

## 2023-10-08 NOTE — Telephone Encounter (Signed)
done

## 2023-10-08 NOTE — Telephone Encounter (Signed)
Patient notified

## 2023-10-11 ENCOUNTER — Telehealth: Payer: Self-pay | Admitting: Nurse Practitioner

## 2023-10-11 ENCOUNTER — Ambulatory Visit: Payer: 59 | Attending: Cardiology

## 2023-10-11 DIAGNOSIS — R600 Localized edema: Secondary | ICD-10-CM | POA: Diagnosis not present

## 2023-10-11 LAB — ECHOCARDIOGRAM COMPLETE
AR max vel: 2.45 cm2
AV Area VTI: 2.34 cm2
AV Area mean vel: 2.35 cm2
AV Mean grad: 3 mm[Hg]
AV Peak grad: 6.2 mm[Hg]
Ao pk vel: 1.24 m/s
Area-P 1/2: 3.48 cm2
Calc EF: 59.5 %
S' Lateral: 2.8 cm
Single Plane A2C EF: 59.5 %
Single Plane A4C EF: 59.1 %

## 2023-10-11 NOTE — Telephone Encounter (Signed)
Received UTI Requisition form from [employer]. Gave to [facility] for approval signature-Toni

## 2023-10-12 DIAGNOSIS — I739 Peripheral vascular disease, unspecified: Secondary | ICD-10-CM | POA: Diagnosis not present

## 2023-10-13 ENCOUNTER — Telehealth: Payer: Self-pay

## 2023-10-15 ENCOUNTER — Ambulatory Visit
Admission: RE | Admit: 2023-10-15 | Discharge: 2023-10-15 | Disposition: A | Discharge status: Home / Self Care | Payer: 59 | Source: Ambulatory Visit | Attending: Urology | Admitting: Urology

## 2023-10-15 DIAGNOSIS — I700 Atherosclerosis of aorta: Secondary | ICD-10-CM | POA: Diagnosis not present

## 2023-10-15 DIAGNOSIS — N2889 Other specified disorders of kidney and ureter: Secondary | ICD-10-CM | POA: Diagnosis not present

## 2023-10-15 MED ORDER — GADOBUTROL 1 MMOL/ML IV SOLN
9.0000 mL | Freq: Once | INTRAVENOUS | Status: AC | PRN
  Administered 2023-10-15: 9 mL via INTRAVENOUS

## 2023-10-20 ENCOUNTER — Telehealth: Payer: Self-pay | Admitting: Nurse Practitioner

## 2023-10-20 NOTE — Telephone Encounter (Signed)
Received urine sample order from [facility]. Per AA, I faxed back uncompleted order, patient to come to office for any UTIs-Toni

## 2023-10-20 NOTE — Progress Notes (Signed)
Cardiology Clinic Note   Date: 10/22/2023 ID: Chelsey [name], Chelsey [name] [birth year], MRN [id number]  Primary Cardiologist:  Debbe Odea, MD  Patient Profile    Chelsey [name] is a 72 y.o. female who presents to the clinic today for follow up after testing.     Past medical history significant for: Lower extremity edema. Echo 10/11/2023: EF 60 to 65%.  No RWMA.  Mild LVH.  Grade I DD.  Normal RV size/function.  Aortic valve sclerosis/calcification without stenosis. PAD. S/p right SFA stents (occluded), left CFA-PT OP BPG occluded. Arterial ultrasound lower extremities 06/28/2020: ABI shows mild arterial insufficiency bilateral lower extremities.  Low velocities bilaterally questionable arterial iliac disease.  Left FA stent. Carotid artery disease. Carotid ultrasound 08/16/2020: Bilateral carotid <50% stenosis.  Mild plaque formation on the left and moderate plaque formation on the right. Hypertension. Renal artery ultrasound 01/27/2019: No evidence of renal artery occlusive disease bilaterally.  Normal size kidneys bilaterally. Hyperlipidemia. Lipid panel 04/09/2023: LDL 59, HDL 35, TG 102, total 114. Pulmonary sarcoidosis. Nonalcoholic fatty liver disease. Hypothyroidism. T2DM. Former tobacco use.  In summary, patient was first evaluated by Dr. Azucena [name] on 09/27/2023 for lower extremity edema.  She has a history of PAD with multiple interventions followed by [facility] vascular surgery.  At the time of her visit she reported chronic claudication.  She reported lower extremity edema progressive throughout the day.  It was felt this was likely secondary to dependent positioning and venous insufficiency.  She was instructed to utilize compression stockings, elevation and as needed Lasix.  Echo was performed to rule out structural contributor and was normal as outlined above.     History of Present Illness    Chelsey [name] is followed by Dr. Azucena [name] for the above  outlined history.   Today, patient is here alone. Patient denies shortness of breath, dyspnea on exertion, orthopnea or PND. No chest pain, pressure, or tightness. No palpitations. She is pending vascular surgery. She continues to have chronic lower extremity edema that is at its best in the morning and progresses throughout the day.  She is able to walk a limited amount with a single point cane. She can perform light to moderate household chores. Activity is limited by chronic claudication and knee pain. Echo results discussed. All questions answered.    ROS: All other systems reviewed and are otherwise negative except as noted in History of Present Illness.  Studies Reviewed    EKG not ordered today. EKG from previous visit 09/27/2023 reviewed and showed NSR, 69 bpm.       Physical Exam    VS:  BP 126/68 (BP Location: Right Arm, Patient Position: Sitting, Cuff Size: Normal)   Pulse 78   Ht 5\' 5"  (1.651 m)   Wt 218 lb 12.8 oz (99.2 kg)   SpO2 96%   BMI 36.41 kg/m  , BMI Body mass index is 36.41 kg/m.  GEN: Well nourished, well developed, in no acute distress. Neck: No JVD or carotid bruits. Cardiac:  RRR. No murmurs. No rubs or gallops.   Respiratory:  Respirations regular and unlabored. Clear to auscultation without rales, wheezing or rhonchi. GI: Soft, nontender, nondistended. Extremities: Radials 2+ and equal bilaterally. Nonpalpable PT/DP pulses bilaterally. No clubbing or cyanosis. Trace edema bilateral lower extremities.   Skin: Warm and dry, no rash. Neuro: Strength intact.  Assessment & Plan   Lower extremity edema Echo November 2024 showed normal LV/RV function, mild LVH, Grade I DD, no significant valvular abnormalities.  Patient reports continued lower extremity edema at its best in the morning and progressing throughout the day. No shortness of breath, orthopnea or PND.  -Continue Lasix as needed. -Continue compression and elevation.  PAD S/p right SFA stents with  known occlusion, left CFA to PT OP BPG occluded.  She is pending redo femoral-popliteal bypass scheduled for the end of December. Nonpalpable PT/DP pulses.  -Continue aspirin, Pletal, atorvastatin, Zetia. -Followed by [facility] vascular surgery.  Hypertension Renal artery ultrasound March 2020 without evidence of renal artery stenosis.  BP today 126/68. No reported headaches or dizziness.  -Continue olmesartan-amlodipine-HCTZ.  Hyperlipidemia LDL May 2024 59, at goal. -Continue atorvastatin and Zetia.  Preoperative cardiovascular risk assessment.  Patient is pending probable redo bypass of left leg and new bypass right leg by Dr. Karie [name] at [facility] vascular surgery. According to the RCRI, patient has a 0.9% risk of MACE. Echo showed normal LV/RV function. -Based on ACC/AHA guidelines, Chelsey [name] would be at acceptable risk for the planned procedure without further cardiovascular testing.   Disposition: Return in 1 year or sooner as needed.          Signed, Chelsey [name]. Chelsey Aracena, DNP, NP-C

## 2023-10-20 NOTE — Addendum Note (Signed)
Addended by: Sallyanne Kuster on: 10/20/2023 10:19 AM   Modules accepted: Orders

## 2023-10-22 ENCOUNTER — Encounter: Payer: Self-pay | Admitting: Student

## 2023-10-22 ENCOUNTER — Ambulatory Visit: Payer: 59 | Admitting: Urology

## 2023-10-22 ENCOUNTER — Ambulatory Visit: Payer: 59 | Attending: Student | Admitting: Student

## 2023-10-22 VITALS — BP 126/68 | HR 78 | Ht 65.0 in | Wt 218.8 lb

## 2023-10-22 DIAGNOSIS — R600 Localized edema: Secondary | ICD-10-CM

## 2023-10-22 DIAGNOSIS — I10 Essential (primary) hypertension: Secondary | ICD-10-CM | POA: Diagnosis not present

## 2023-10-22 DIAGNOSIS — E785 Hyperlipidemia, unspecified: Secondary | ICD-10-CM | POA: Diagnosis not present

## 2023-10-22 DIAGNOSIS — Z01810 Encounter for preprocedural cardiovascular examination: Secondary | ICD-10-CM | POA: Diagnosis not present

## 2023-10-22 DIAGNOSIS — I739 Peripheral vascular disease, unspecified: Secondary | ICD-10-CM | POA: Diagnosis not present

## 2023-10-22 NOTE — Patient Instructions (Signed)
 Medication Instructions:  Your physician recommends that you continue on your current medications as directed. Please refer to the Current Medication list given to you today.   *If you need a refill on your cardiac medications before your next appointment, please call your pharmacy*   Lab Work: No labs ordered today    Testing/Procedures: No test ordered today    Follow-Up: At [facility], you and your health needs are our priority.  As part of our continuing mission to provide you with exceptional heart care, we have created designated Provider Care Teams.  These Care Teams include your primary Cardiologist (physician) and Advanced Practice Providers (APPs -  Physician Assistants and Nurse Practitioners) who all work together to provide you with the care you need, when you need it.  We recommend signing up for the patient portal called "MyChart".  Sign up information is provided on this After Visit Summary.  MyChart is used to connect with patients for Virtual Visits (Telemedicine).  Patients are able to view lab/test results, encounter notes, upcoming appointments, etc.  Non-urgent messages can be sent to your provider as well.   To learn more about what you can do with MyChart, go to [email or web address].    Your next appointment:   1 year(s)  Provider:   You may see Debbe Odea, MD or one of the following Advanced Practice Providers on your designated Care Team:   Nicolasa Ducking, NP Eula Listen, PA-C Cadence Fransico Michael, PA-C Charlsie Quest, NP Carlos Levering, NP

## 2023-10-26 ENCOUNTER — Encounter: Payer: Self-pay | Admitting: Nurse Practitioner

## 2023-10-29 ENCOUNTER — Telehealth: Payer: Self-pay

## 2023-10-29 DIAGNOSIS — Z794 Long term (current) use of insulin: Secondary | ICD-10-CM | POA: Diagnosis not present

## 2023-10-29 DIAGNOSIS — E118 Type 2 diabetes mellitus with unspecified complications: Secondary | ICD-10-CM | POA: Diagnosis not present

## 2023-10-29 NOTE — Telephone Encounter (Signed)
Per previous note patient insurance won't pay for a lift chair and patient can't pay for one out of pocket.

## 2023-10-29 NOTE — Telephone Encounter (Signed)
Due to patient insurance not paying for lift chair, patient can't pay out of pocket for one either, so she doesn't want [place] to send paper work to [employer].

## 2023-11-18 ENCOUNTER — Ambulatory Visit: Payer: 59 | Admitting: Nurse Practitioner

## 2023-11-24 ENCOUNTER — Ambulatory Visit: Payer: 59 | Admitting: Urology

## 2023-11-24 DIAGNOSIS — E118 Type 2 diabetes mellitus with unspecified complications: Secondary | ICD-10-CM | POA: Diagnosis not present

## 2023-11-26 ENCOUNTER — Encounter: Payer: Self-pay | Admitting: Podiatry

## 2023-11-26 ENCOUNTER — Ambulatory Visit (INDEPENDENT_AMBULATORY_CARE_PROVIDER_SITE_OTHER): Payer: 59 | Admitting: Podiatry

## 2023-11-26 VITALS — Ht 65.0 in | Wt 218.8 lb

## 2023-11-26 DIAGNOSIS — M79676 Pain in unspecified toe(s): Secondary | ICD-10-CM | POA: Diagnosis not present

## 2023-11-26 DIAGNOSIS — E1159 Type 2 diabetes mellitus with other circulatory complications: Secondary | ICD-10-CM | POA: Diagnosis not present

## 2023-11-26 DIAGNOSIS — G6289 Other specified polyneuropathies: Secondary | ICD-10-CM | POA: Diagnosis not present

## 2023-11-26 DIAGNOSIS — B351 Tinea unguium: Secondary | ICD-10-CM

## 2023-12-04 ENCOUNTER — Encounter: Payer: Self-pay | Admitting: Podiatry

## 2023-12-04 NOTE — Progress Notes (Signed)
Subjective:  Patient ID: Pranita Ara, female    DOB: 07/21/1951,  MRN: [id number]  73 y.o. female presents with at risk foot care. Pt has h/o NIDDM with PAD and painful thick toenails that are difficult to trim. Pain interferes with ambulation. Aggravating factors include wearing enclosed shoe gear. Pain is relieved with periodic professional debridement. Chief Complaint  Patient presents with   Nail Problem    Pt is here for [facility], pt is unsure of last A1C ,PCP is Dr Tedd Sias and LOV was in November.     PCP: Sallyanne Kuster, NP.  New problem(s): None.   Review of Systems: Negative except as noted in the HPI.   Allergies  Allergen Reactions   Oxycodone-Acetaminophen Other (See Comments)    Hallucinations Hallucinations HALLUCINATIONS Other reaction(s): Other (See Comments) HALLUCINATIONS Other reaction(s): Other (See Comments) Hallucinations Hallucinations HALLUCINATIONS Hallucinations Other reaction(s): Other (See Comments), Other (See Comments) HALLUCINATIONS Hallucinations Hallucinations HALLUCINATIONS Other reaction(s): Other (See Comments) HALLUCINATIONS Other reaction(s): Other (See Comments) Hallucinations Hallucinations HALLUCINATIONS Hallucinations Other reaction(s): Other (See Comments) HALLUCINATIONS Other reaction(s): Other (See Comments) Hallucinations Hallucinations HALLUCINATIONS Hallucinations    Indomethacin Other (See Comments)    BURN HOLE IN STOMACH BURN HOLE IN STOMACH Other reaction(s): Other (See Comments), Other (See Comments) BURN HOLE IN STOMACH BURN HOLE IN STOMACH BURN HOLE IN STOMACH BURN HOLE IN STOMACH BURN HOLE IN STOMACH    Penicillins Rash    Objective:  There were no vitals filed for this visit. Constitutional Patient is a pleasant 73 y.o. female in NAD. AAO x 3. AAO x 3.  Vascular Capillary fill time to digits <3 seconds.  DP/PT pulse(s) are faintly palpable b/l lower extremities. Pedal hair absent b/l.  Lower extremity skin temperature gradient warm to cool b/l. No pain with calf compression b/l. No cyanosis or clubbing noted. No ischemia nor gangrene noted b/l.   Neurologic Protective sensation diminished with 10g monofilament b/l.  Dermatologic Pedal skin is thin, shiny and atrophic b/l.  No open wounds b/l lower extremities. No interdigital macerations b/l lower extremities. Toenails 1-5 b/l elongated, discolored, dystrophic, thickened, crumbly with subungual debris and tenderness to dorsal palpation. No corns, calluses nor porokeratotic lesions noted.  Orthopedic: Normal muscle strength 5/5 to all lower extremity muscle groups bilaterally. Pes planus deformity noted bilateral LE.   Last HgA1c:      No data to display           Assessment:   1. Pain due to onychomycosis of toenail   2. Other polyneuropathy   3. Type 2 diabetes mellitus with vascular disease (HCC)    Plan:  -Consent given for treatment as described below: -Examined patient. -Continue supportive shoe gear daily. -Mycotic toenails 1-5 bilaterally were debrided in length and girth with sterile nail nippers and dremel without incident. -Patient/POA to call should there be question/concern in the interim.  Return in about 3 months (around 02/24/2024).  Freddie Breech, DPM      [place] LOCATION: [address], [place] [address]                   Office [phone]   [place] LOCATION: [address] [place], [place] [address] Office [phone])  [phone] 

## 2023-12-09 ENCOUNTER — Ambulatory Visit: Payer: 59 | Admitting: Nurse Practitioner

## 2023-12-14 ENCOUNTER — Telehealth: Payer: Self-pay | Admitting: Nurse Practitioner

## 2023-12-14 DIAGNOSIS — M79605 Pain in left leg: Secondary | ICD-10-CM | POA: Diagnosis not present

## 2023-12-14 DIAGNOSIS — M25551 Pain in right hip: Secondary | ICD-10-CM | POA: Diagnosis not present

## 2023-12-14 DIAGNOSIS — Z79899 Other long term (current) drug therapy: Secondary | ICD-10-CM | POA: Diagnosis not present

## 2023-12-14 DIAGNOSIS — T82898S Other specified complication of vascular prosthetic devices, implants and grafts, sequela: Secondary | ICD-10-CM | POA: Diagnosis not present

## 2023-12-14 DIAGNOSIS — Z87891 Personal history of nicotine dependence: Secondary | ICD-10-CM | POA: Diagnosis not present

## 2023-12-14 DIAGNOSIS — J449 Chronic obstructive pulmonary disease, unspecified: Secondary | ICD-10-CM | POA: Diagnosis not present

## 2023-12-14 DIAGNOSIS — M79674 Pain in right toe(s): Secondary | ICD-10-CM | POA: Diagnosis not present

## 2023-12-14 DIAGNOSIS — Z7985 Long-term (current) use of injectable non-insulin antidiabetic drugs: Secondary | ICD-10-CM | POA: Diagnosis not present

## 2023-12-14 DIAGNOSIS — T82898A Other specified complication of vascular prosthetic devices, implants and grafts, initial encounter: Secondary | ICD-10-CM | POA: Diagnosis not present

## 2023-12-14 DIAGNOSIS — Z7951 Long term (current) use of inhaled steroids: Secondary | ICD-10-CM | POA: Diagnosis not present

## 2023-12-14 DIAGNOSIS — Z7984 Long term (current) use of oral hypoglycemic drugs: Secondary | ICD-10-CM | POA: Diagnosis not present

## 2023-12-14 DIAGNOSIS — E1151 Type 2 diabetes mellitus with diabetic peripheral angiopathy without gangrene: Secondary | ICD-10-CM | POA: Diagnosis not present

## 2023-12-14 DIAGNOSIS — Z7982 Long term (current) use of aspirin: Secondary | ICD-10-CM | POA: Diagnosis not present

## 2023-12-14 DIAGNOSIS — I10 Essential (primary) hypertension: Secondary | ICD-10-CM | POA: Diagnosis not present

## 2023-12-14 NOTE — Telephone Encounter (Signed)
Received request from [facility] for diabetic notes and other information. Sent fax back to them to contact patient's endocrinologist @ [facility]

## 2023-12-16 ENCOUNTER — Encounter: Payer: Self-pay | Admitting: Nurse Practitioner

## 2023-12-16 ENCOUNTER — Ambulatory Visit: Payer: 59 | Admitting: Nurse Practitioner

## 2023-12-16 VITALS — BP 124/77 | HR 78 | Temp 98.0°F | Resp 16 | Ht 65.0 in | Wt 213.6 lb

## 2023-12-16 DIAGNOSIS — I152 Hypertension secondary to endocrine disorders: Secondary | ICD-10-CM | POA: Diagnosis not present

## 2023-12-16 DIAGNOSIS — E785 Hyperlipidemia, unspecified: Secondary | ICD-10-CM | POA: Diagnosis not present

## 2023-12-16 DIAGNOSIS — Z794 Long term (current) use of insulin: Secondary | ICD-10-CM | POA: Diagnosis not present

## 2023-12-16 DIAGNOSIS — I739 Peripheral vascular disease, unspecified: Secondary | ICD-10-CM

## 2023-12-16 DIAGNOSIS — E1159 Type 2 diabetes mellitus with other circulatory complications: Secondary | ICD-10-CM

## 2023-12-16 DIAGNOSIS — Z79899 Other long term (current) drug therapy: Secondary | ICD-10-CM | POA: Diagnosis not present

## 2023-12-16 DIAGNOSIS — T82898A Other specified complication of vascular prosthetic devices, implants and grafts, initial encounter: Secondary | ICD-10-CM | POA: Insufficient documentation

## 2023-12-16 DIAGNOSIS — E1169 Type 2 diabetes mellitus with other specified complication: Secondary | ICD-10-CM | POA: Diagnosis not present

## 2023-12-16 DIAGNOSIS — K1379 Other lesions of oral mucosa: Secondary | ICD-10-CM

## 2023-12-16 MED ORDER — LEVOTHYROXINE SODIUM 50 MCG PO TABS
ORAL_TABLET | ORAL | 3 refills | Status: DC
Start: 2023-12-16 — End: 2024-10-03

## 2023-12-16 MED ORDER — OLMESARTAN-AMLODIPINE-HCTZ 40-10-25 MG PO TABS
1.0000 | ORAL_TABLET | Freq: Every day | ORAL | 1 refills | Status: DC
Start: 2023-12-16 — End: 2024-03-15

## 2023-12-16 MED ORDER — OMEPRAZOLE 40 MG PO CPDR
40.0000 mg | DELAYED_RELEASE_CAPSULE | Freq: Every day | ORAL | 3 refills | Status: DC
Start: 2023-12-16 — End: 2024-10-03

## 2023-12-16 MED ORDER — ATORVASTATIN CALCIUM 10 MG PO TABS
ORAL_TABLET | ORAL | 3 refills | Status: DC
Start: 2023-12-16 — End: 2024-10-03

## 2023-12-16 MED ORDER — CILOSTAZOL 50 MG PO TABS
50.0000 mg | ORAL_TABLET | Freq: Two times a day (BID) | ORAL | 1 refills | Status: DC
Start: 2023-12-16 — End: 2024-05-17

## 2023-12-16 MED ORDER — CELECOXIB 100 MG PO CAPS
ORAL_CAPSULE | ORAL | 1 refills | Status: DC
Start: 2023-12-16 — End: 2024-03-15

## 2023-12-16 MED ORDER — EZETIMIBE 10 MG PO TABS
10.0000 mg | ORAL_TABLET | Freq: Every day | ORAL | 1 refills | Status: DC
Start: 2023-12-16 — End: 2024-03-15

## 2023-12-16 MED ORDER — FEBUXOSTAT 40 MG PO TABS
40.0000 mg | ORAL_TABLET | Freq: Every day | ORAL | 3 refills | Status: DC
Start: 2023-12-16 — End: 2024-10-03

## 2023-12-16 MED ORDER — GABAPENTIN 300 MG PO CAPS
300.0000 mg | ORAL_CAPSULE | Freq: Three times a day (TID) | ORAL | 0 refills | Status: DC
Start: 2023-12-16 — End: 2024-03-15

## 2023-12-16 MED ORDER — DAPAGLIFLOZIN PROPANEDIOL 10 MG PO TABS
10.0000 mg | ORAL_TABLET | Freq: Every day | ORAL | 1 refills | Status: DC
Start: 2023-12-16 — End: 2024-03-15

## 2023-12-16 NOTE — Progress Notes (Signed)
[facility] [address] [place], [place] [address]  Internal MEDICINE  Office Visit Note  Patient Name: Chelsey [name]  [id number]  [id number]  Date of Service: 12/16/2023  Chief Complaint  Patient presents with   Diabetes   Gastroesophageal Reflux   Hypertension   Follow-up    HPI Chelsey [name] presents for a follow-up visit for leg pain, diabetes, high cholesterol, hypertension and medication refills.  Pain and swelling of left leg, waiting for surgery but has to get her A1c down before they will do surgery. Diabetes -- goes to [facility] endocrinology, her most recent A1c was earlier this month and is improving at 9.3. her endo has her using CGM to monitor her glucose. Currently on ozempic, insulin and farxiga. Medication list needed to be updated.  High cholesterol -- taking atorvastatin and ezetimibe Hypertension -- controlled with current medication, also sees cardiology regularly.  Hypothyroidism -- taking levothyroxine      Current Medication: Outpatient Encounter Medications as of 12/16/2023  Medication Sig   BAQSIMI TWO PACK 3 MG/DOSE POWD    OZEMPIC, 2 MG/DOSE, 8 MG/3ML SOPN Inject 2 mg into the skin once a week.   albuterol (VENTOLIN HFA) 108 (90 Base) MCG/ACT inhaler Inhale 2 puffs into the lungs every 6 (six) hours as needed for wheezing or shortness of breath.   aspirin EC 81 MG tablet Take 1 tablet (81 mg total) by mouth daily.   atorvastatin (LIPITOR) 10 MG tablet TAKE ONE TABLET BY MOUTH ONCE DAILY FOR cholesterol   celecoxib (CELEBREX) 100 MG capsule TAKE 1 CAPSULE(100 MG) BY MOUTH DAILY   chlorhexidine (PERIDEX) 0.12 % solution    cilostazol (PLETAL) 50 MG tablet Take 1 tablet (50 mg total) by mouth 2 (two) times daily.   dapagliflozin propanediol (FARXIGA) 10 MG TABS tablet Take 1 tablet (10 mg total) by mouth daily.   ezetimibe (ZETIA) 10 MG tablet Take 1 tablet (10 mg total) by mouth daily.   febuxostat (ULORIC) 40 MG tablet Take 1 tablet (40 mg  total) by mouth daily.   FEROSUL 325 (65 Fe) MG tablet TAKE 1 TABLET BY MOUTH DAILY WITH BREAKFAST   fluticasone-salmeterol (ADVAIR) 250-50 MCG/ACT AEPB INHALE 1 PUFF BY MOUTH INTO LUNGS IN THE MORNING and AT BEDTIME   furosemide (LASIX) 20 MG tablet Take 1 tablet (20 mg total) by mouth daily as needed (increased swelling of legs).   gabapentin (NEURONTIN) 300 MG capsule Take 1 capsule (300 mg total) by mouth 3 (three) times daily.   glucose blood test strip Test sugar 4 x aday for uncontrolled dm on insulin E11.22   HUMULIN 70/30 KWIKPEN (70-30) 100 UNIT/ML KwikPen INJECT 25 UNITS INTO THE SKIN TWICE DAILY   hydroxychloroquine (PLAQUENIL) 200 MG tablet Take 1 tablet (200 mg total) by mouth 2 (two) times daily.   Insulin Pen Needle (COMFORT EZ PEN NEEDLES) 31G X 8 MM MISC Twice a day.   LANCETS ULTRA THIN MISC Apply 1 each topically daily.    levothyroxine (SYNTHROID) 50 MCG tablet TAKE ONE TABLET BY MOUTH EVERY MORNING ON AN EMPTY STOMACH   lidocaine (XYLOCAINE) 2 % solution Use as directed 15 mLs in the mouth or throat 3 (three) times daily as needed for mouth pain.   Olmesartan-amLODIPine-HCTZ 40-10-25 MG TABS Take 1 tablet by mouth daily.   omeprazole (PRILOSEC) 40 MG capsule Take 1 capsule (40 mg total) by mouth daily.   OXYGEN Inhale 3 L into the lungs. Nighttime use   [DISCONTINUED] atorvastatin (LIPITOR) 10  MG tablet TAKE ONE TABLET BY MOUTH ONCE DAILY FOR cholesterol   [DISCONTINUED] celecoxib (CELEBREX) 100 MG capsule TAKE 1 CAPSULE(100 MG) BY MOUTH DAILY   [DISCONTINUED] cilostazol (PLETAL) 50 MG tablet Take 1 tablet (50 mg total) by mouth 2 (two) times daily.   [DISCONTINUED] dapagliflozin propanediol (FARXIGA) 10 MG TABS tablet Take 1 tablet (10 mg total) by mouth daily.   [DISCONTINUED] Dulaglutide 4.5 MG/0.5ML SOPN Inject 4.5 mg into the skin once a week.   [DISCONTINUED] ezetimibe (ZETIA) 10 MG tablet Take 1 tablet (10 mg total) by mouth daily.   [DISCONTINUED] febuxostat  (ULORIC) 40 MG tablet Take 1 tablet (40 mg total) by mouth daily.   [DISCONTINUED] gabapentin (NEURONTIN) 300 MG capsule Take 1 capsule (300 mg total) by mouth 3 (three) times daily.   [DISCONTINUED] glipiZIDE (GLUCOTROL XL) 2.5 MG 24 hr tablet Take 1 tablet (2.5 mg total) by mouth daily with breakfast.   [DISCONTINUED] levothyroxine (SYNTHROID) 50 MCG tablet TAKE ONE TABLET BY MOUTH EVERY MORNING ON AN EMPTY STOMACH   [DISCONTINUED] Olmesartan-amLODIPine-HCTZ 40-10-25 MG TABS Take 1 tablet by mouth daily.   [DISCONTINUED] omeprazole (PRILOSEC) 40 MG capsule Take 1 capsule (40 mg total) by mouth daily.   No facility-administered encounter medications on file as of 12/16/2023.    Surgical History: Past Surgical History:  Procedure Laterality Date   BRAIN SURGERY  1990's   CATARACT EXTRACTION W/PHACO Left 06/11/2016   Procedure: CATARACT EXTRACTION PHACO AND INTRAOCULAR LENS PLACEMENT (IOC);  Surgeon: Galen Manila, MD;  Location: [facility] ORS;  Service: Ophthalmology;  Laterality: Left;  [place] 1.01 AP% 21.6 CDE 13.23 Fluid pack lot # [id number]   CEREBRAL ANEURYSM REPAIR  1990's   COILS   CORONARY ARTERY BYPASS GRAFT     EYE SURGERY  2000's   bilateral cataract   FRACTURE SURGERY     HERNIA REPAIR  2000   ventral   STENT PLACEMENT VASCULAR ([facility] HX)     VEIN BYPASS SURGERY     VENTRAL HERNIA REPAIR N/A 04/29/2016   Procedure: Laparoscopic HERNIA REPAIR VENTRAL ADULT;  Surgeon: Leafy Ro, MD;  Location: [facility] ORS;  Service: General;  Laterality: N/A;    Medical History: Past Medical History:  Diagnosis Date   Anemia    Aneurysm (HCC)    Arthritis    RHEUMATOID ARTHRITIS   Asthma    Collagen vascular disease (HCC)    COPD (chronic obstructive pulmonary disease) (HCC)    Coronary artery disease    Dementia (HCC)    Diabetes mellitus without complication (HCC)    Edema    FEET/LEGS   GERD (gastroesophageal reflux disease)    Gout    H/O wheezing    History of hiatal hernia     Hypertension    Hypothyroidism    Neuropathy    Oxygen deficiency    HS   Peripheral vascular disease (HCC)    Sarcoidosis of lung (HCC)    Seizures (HCC)    WITH BRAIN 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    Sleep apnea    OXYGEN AT NIGHT 3 L     Family History: Family History  Problem Relation Age of Onset   Heart disease Mother    Hypertension Mother    Diabetes Mother    Diabetes Father    Heart disease Father    Hypertension Father    Breast cancer Maternal Aunt     Social History   Socioeconomic History   Marital status: Single    Spouse name:  Not on file   Number of children: Not on file   Years of education: Not on file   Highest education level: Not on file  Occupational History   Not on file  Tobacco Use   Smoking status: Former    Current packs/day: 0.00    Average packs/day: 1 pack/day for 30.0 years (30.0 ttl pk-yrs)    Types: Cigarettes    Start date: 01/28/1976    Quit date: 01/27/2006    Years since quitting: 17.8    Passive exposure: Past   Smokeless tobacco: Never   Tobacco comments:    quit   Vaping Use   Vaping status: Never Used  Substance and Sexual Activity   Alcohol use: No    Alcohol/week: 0.0 standard drinks of alcohol   Drug use: No   Sexual activity: Not on file  Other Topics Concern   Not on file  Social History Narrative   Not on file   Social Drivers of Health   Financial Resource Strain: Not on file  Food Insecurity: Not on file  Transportation Needs: Not on file  Physical Activity: Not on file  Stress: Not on file  Social Connections: Not on file  Intimate Partner Violence: Not on file      Review of Systems  Constitutional:  Positive for fatigue. Negative for fever.  HENT:  Negative for congestion, mouth sores and postnasal drip.   Respiratory:  Negative for cough.   Cardiovascular:  Positive for leg swelling. Negative for chest pain.  Genitourinary:  Negative for flank pain.  Musculoskeletal:  Positive for  arthralgias and gait problem.  Psychiatric/Behavioral: Negative.      Vital Signs: BP 124/77   Pulse 78   Temp 98 F (36.7 C)   Resp 16   Ht 5\' 5"  (1.651 m)   Wt 213 lb 9.6 oz (96.9 kg)   SpO2 93%   BMI 35.54 kg/m    Physical Exam Vitals and nursing note reviewed.  Constitutional:      General: She is not in acute distress.    Appearance: Normal appearance. She is obese. She is not ill-appearing.  HENT:     Head: Normocephalic and atraumatic.  Eyes:     Pupils: Pupils are equal, round, and reactive to light.  Cardiovascular:     Rate and Rhythm: Normal rate and regular rhythm.     Heart sounds: Normal heart sounds. No murmur heard. Pulmonary:     Effort: Pulmonary effort is normal. No respiratory distress.     Breath sounds: Normal breath sounds. No wheezing.  Abdominal:     General: Bowel sounds are normal.     Palpations: Abdomen is soft.  Musculoskeletal:     Right lower leg: 2+ Pitting Edema present.     Left lower leg: 2+ Pitting Edema present.     Comments: Chronic left knee pain; min lower extremity swelling  Skin:    General: Skin is warm and dry.  Neurological:     Mental Status: She is alert and oriented to person, place, and time.  Psychiatric:        Mood and Affect: Mood normal.        Behavior: Behavior normal.        Assessment/Plan: 1. Peripheral vascular disease (HCC) (Primary) Continue cilostazol as prescribed.  - cilostazol (PLETAL) 50 MG tablet; Take 1 tablet (50 mg total) by mouth 2 (two) times daily.  Dispense: 180 tablet; Refill: 1  2. Occlusion of femoroperoneal bypass  graft (HCC) Continue cilostazol as prescribed. Needs to get surgery per her vascular surgeon but they want her A1c to be stable first.  - cilostazol (PLETAL) 50 MG tablet; Take 1 tablet (50 mg total) by mouth 2 (two) times daily.  Dispense: 180 tablet; Refill: 1  3. Mouth sores Has upcoming follow up with ENT, they have not figured out why she keeps getting sores in  her mouth, the biopsy was inconclusive.   4. Type 2 diabetes mellitus with other specified complication, with long-term current use of insulin [facility]) Sees [facility]ocrin[facility], continues to take farxiga which is prescribed by PCP. Her endocrinologist has discontinued trulicity and switched her to ozempic and has also prescribed Baqsimi for emergency hypoglycemic episodes. She has not had to use Baqsimi yet.  - OZEMPIC, 2 MG/DOSE, 8 MG/3ML SOPN; Inject 2 mg into the skin once a week. - BAQSIMI TWO PACK 3 MG/DOSE POWD - dapagliflozin propanediol (FARXIGA) 10 MG TABS tablet; Take 1 tablet (10 mg total) by mouth daily.  Dispense: 90 tablet; Refill: 1  5. Hypertension associated with type 2 diabetes mellitus (HCC) Stable, continue olmesartan-amlodipine-hydrochlorothiazide as prescribed.  - Olmesartan-amLODIPine-HCTZ 40-10-25 MG TABS; Take 1 tablet by mouth daily.  Dispense: 90 tablet; Refill: 1  6. Hyperlipidemia associated with type 2 diabetes mellitus (HCC) Continue atorvastatin and ezetimibe as prescribed.  - atorvastatin (LIPITOR) 10 MG tablet; TAKE ONE TABLET BY MOUTH ONCE DAILY FOR cholesterol  Dispense: 90 tablet; Refill: 3 - ezetimibe (ZETIA) 10 MG tablet; Take 1 tablet (10 mg total) by mouth daily.  Dispense: 90 tablet; Refill: 1  7. Encounter for medication review Medication list reviewed, updated and refills ordered  - celecoxib (CELEBREX) 100 MG capsule; TAKE 1 CAPSULE(100 MG) BY MOUTH DAILY  Dispense: 90 capsule; Refill: 1 - febuxostat (ULORIC) 40 MG tablet; Take 1 tablet (40 mg total) by mouth daily.  Dispense: 90 tablet; Refill: 3 - gabapentin (NEURONTIN) 300 MG capsule; Take 1 capsule (300 mg total) by mouth 3 (three) times daily.  Dispense: 270 capsule; Refill: 0 - levothyroxine (SYNTHROID) 50 MCG tablet; TAKE ONE TABLET BY MOUTH EVERY MORNING ON AN EMPTY STOMACH  Dispense: 90 tablet; Refill: 3 - omeprazole (PRILOSEC) 40 MG capsule; Take 1 capsule (40 mg total) by mouth daily.   Dispense: 90 capsule; Refill: 3   General Counseling: Chelsey [name] verbalizes understanding of the findings of todays visit and agrees with plan of treatment. I have discussed any further diagnostic evaluation that may be needed or ordered today. We also reviewed her medications today. she has been encouraged to call the office with any questions or concerns that should arise related to todays visit.    No orders of the defined types were placed in this encounter.   Meds ordered this encounter  Medications   cilostazol (PLETAL) 50 MG tablet    Sig: Take 1 tablet (50 mg total) by mouth 2 (two) times daily.    Dispense:  180 tablet    Refill:  1   celecoxib (CELEBREX) 100 MG capsule    Sig: TAKE 1 CAPSULE(100 MG) BY MOUTH DAILY    Dispense:  90 capsule    Refill:  1   atorvastatin (LIPITOR) 10 MG tablet    Sig: TAKE ONE TABLET BY MOUTH ONCE DAILY FOR cholesterol    Dispense:  90 tablet    Refill:  3   ezetimibe (ZETIA) 10 MG tablet    Sig: Take 1 tablet (10 mg total) by mouth daily.    Dispense:  90 tablet    Refill:  1   febuxostat (ULORIC) 40 MG tablet    Sig: Take 1 tablet (40 mg total) by mouth daily.    Dispense:  90 tablet    Refill:  3   gabapentin (NEURONTIN) 300 MG capsule    Sig: Take 1 capsule (300 mg total) by mouth 3 (three) times daily.    Dispense:  270 capsule    Refill:  0   dapagliflozin propanediol (FARXIGA) 10 MG TABS tablet    Sig: Take 1 tablet (10 mg total) by mouth daily.    Dispense:  90 tablet    Refill:  1   levothyroxine (SYNTHROID) 50 MCG tablet    Sig: TAKE ONE TABLET BY MOUTH EVERY MORNING ON AN EMPTY STOMACH    Dispense:  90 tablet    Refill:  3   Olmesartan-amLODIPine-HCTZ 40-10-25 MG TABS    Sig: Take 1 tablet by mouth daily.    Dispense:  90 tablet    Refill:  1   omeprazole (PRILOSEC) 40 MG capsule    Sig: Take 1 capsule (40 mg total) by mouth daily.    Dispense:  90 capsule    Refill:  3    Note increased dose, fill new script asap     Return in about 3 months (around 03/15/2024) for F/U, Chelsey [name] PCP.   Total time spent:30 Minutes Time spent includes review of chart, medications, test results, and follow up plan with the patient.    Controlled Substance Database was reviewed by me.  This patient was seen by Sallyanne Kuster, FNP-C in collaboration with Dr. Beverely Risen as a part of collaborative care agreement.   Joshia Kitchings R. Tedd Sias, MSN, FNP-C Internal medicine

## 2023-12-20 ENCOUNTER — Encounter: Payer: Self-pay | Admitting: Nurse Practitioner

## 2023-12-23 ENCOUNTER — Ambulatory Visit: Payer: 59 | Admitting: Urology

## 2023-12-30 ENCOUNTER — Other Ambulatory Visit: Payer: Self-pay

## 2023-12-30 MED ORDER — FREESTYLE LIBRE 2 SENSOR MISC: 11 refills | Status: DC

## 2023-12-30 NOTE — Telephone Encounter (Signed)
Pt called that need PA for freestyle libre and she is not getting any response from [facility] as per alyssa sent pres to phar and pt advised that let fax [place] PA if needed

## 2023-12-31 DIAGNOSIS — L731 Pseudofolliculitis barbae: Secondary | ICD-10-CM | POA: Diagnosis not present

## 2024-01-07 ENCOUNTER — Ambulatory Visit (INDEPENDENT_AMBULATORY_CARE_PROVIDER_SITE_OTHER): Payer: 59

## 2024-01-07 DIAGNOSIS — M2142 Flat foot [pes planus] (acquired), left foot: Secondary | ICD-10-CM | POA: Diagnosis not present

## 2024-01-07 DIAGNOSIS — I739 Peripheral vascular disease, unspecified: Secondary | ICD-10-CM | POA: Diagnosis not present

## 2024-01-07 DIAGNOSIS — E1159 Type 2 diabetes mellitus with other circulatory complications: Secondary | ICD-10-CM

## 2024-01-07 DIAGNOSIS — M2141 Flat foot [pes planus] (acquired), right foot: Secondary | ICD-10-CM

## 2024-01-07 DIAGNOSIS — M2041 Other hammer toe(s) (acquired), right foot: Secondary | ICD-10-CM

## 2024-01-07 DIAGNOSIS — M2042 Other hammer toe(s) (acquired), left foot: Secondary | ICD-10-CM

## 2024-01-07 NOTE — Progress Notes (Signed)

## 2024-01-11 ENCOUNTER — Encounter: Payer: Self-pay | Admitting: Podiatry

## 2024-01-11 ENCOUNTER — Ambulatory Visit (INDEPENDENT_AMBULATORY_CARE_PROVIDER_SITE_OTHER): Payer: 59 | Admitting: Podiatry

## 2024-01-11 DIAGNOSIS — L600 Ingrowing nail: Secondary | ICD-10-CM | POA: Diagnosis not present

## 2024-01-11 NOTE — Progress Notes (Signed)
 Chief Complaint  Patient presents with   Diabetes    "I got an ingrown toenail and that toe is turning black.  My last A1c was a 9." N - ingrown toenail L - hallux left D - 2/3 mos O - gradually worse C - black, ingrown, sharp pain A - shoe T - none    Subjective: Patient presents today for evaluation of pain to the medial border of the left great toe. Patient is concerned for possible ingrown nail.  It is very sensitive to touch.  Patient states that the pain is very excruciating and tender in close toed shoes.  Patient presents today for further treatment and evaluation.  Past Medical History:  Diagnosis Date   Anemia    Aneurysm (HCC)    Arthritis    RHEUMATOID ARTHRITIS   Asthma    Collagen vascular disease (HCC)    COPD (chronic obstructive pulmonary disease) (HCC)    Coronary artery disease    Dementia (HCC)    Diabetes mellitus without complication (HCC)    Edema    FEET/LEGS   GERD (gastroesophageal reflux disease)    Gout    H/O wheezing    History of hiatal hernia    Hypertension    Hypothyroidism    Neuropathy    Oxygen deficiency    HS   Peripheral vascular disease (HCC)    Sarcoidosis of lung (HCC)    Seizures (HCC)    WITH BRAIN 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    Sleep apnea    OXYGEN AT NIGHT 3 L [place]    Past Surgical History:  Procedure Laterality Date   BRAIN SURGERY  1990's   CATARACT EXTRACTION W/PHACO Left 06/11/2016   Procedure: CATARACT EXTRACTION PHACO AND INTRAOCULAR LENS PLACEMENT (IOC);  Surgeon: Galen Manila, MD;  Location: [facility] ORS;  Service: Ophthalmology;  Laterality: Left;  [place] 1.01 AP% 21.6 CDE 13.23 Fluid pack lot # [id number]   CEREBRAL ANEURYSM REPAIR  1990's   COILS   CORONARY ARTERY BYPASS GRAFT     EYE SURGERY  2000's   bilateral cataract   FRACTURE SURGERY     HERNIA REPAIR  2000   ventral   STENT PLACEMENT VASCULAR ([facility] HX)     VEIN BYPASS SURGERY     VENTRAL HERNIA REPAIR N/A 04/29/2016   Procedure: Laparoscopic  HERNIA REPAIR VENTRAL ADULT;  Surgeon: Leafy Ro, MD;  Location: [facility] ORS;  Service: General;  Laterality: N/A;    Allergies  Allergen Reactions   Oxycodone-Acetaminophen Other (See Comments)    Hallucinations Hallucinations HALLUCINATIONS Other reaction(s): Other (See Comments) HALLUCINATIONS Other reaction(s): Other (See Comments) Hallucinations Hallucinations HALLUCINATIONS Hallucinations Other reaction(s): Other (See Comments), Other (See Comments) HALLUCINATIONS Hallucinations Hallucinations HALLUCINATIONS Other reaction(s): Other (See Comments) HALLUCINATIONS Other reaction(s): Other (See Comments) Hallucinations Hallucinations HALLUCINATIONS Hallucinations Other reaction(s): Other (See Comments) HALLUCINATIONS Other reaction(s): Other (See Comments) Hallucinations Hallucinations HALLUCINATIONS Hallucinations    Indomethacin Other (See Comments)    BURN HOLE IN STOMACH BURN HOLE IN STOMACH Other reaction(s): Other (See Comments), Other (See Comments) BURN HOLE IN STOMACH BURN HOLE IN STOMACH BURN HOLE IN STOMACH BURN HOLE IN STOMACH BURN HOLE IN STOMACH    Penicillins Rash    Objective:  General: Well developed, nourished, in no acute distress, alert and oriented x3   Dermatology: Skin is warm, dry and supple bilateral.  Medial border left great toe is tender with evidence of an ingrowing nail. Pain on palpation noted to the border of the nail  fold. The remaining nails appear unremarkable at this time.   Vascular: DP and PT pulses palpable.  No clinical evidence of vascular compromise  Neruologic: Grossly intact via light touch bilateral.  Musculoskeletal: No pedal deformity noted  Assesement: #1  Symptomatic painful ingrowing toenail medial border left hallux  Plan of Care:  -Patient evaluated.  -Discussed treatment alternatives and plan of care. Explained nail avulsion procedure and post procedure course to patient. -Patient opted for  temporary partial nail avulsion of the ingrown portion of the nail.  -Prior to procedure, local anesthesia infiltration utilized using 3 ml of a 50:50 mixture of 2% plain lidocaine and 0.5% plain marcaine in a normal hallux block fashion and a betadine prep performed.  -Partial temporary nail avulsion was performed to the offending border of the nail plate.  No phenol utilized.  Patient has a history of diabetes with PVD however there was adequate perfusion to the toe and no indication of infection.  Good potential for healing -Post care instructions provided -Return to clinic 2 weeks  Felecia Shelling, DPM [facility]  Dr. Felecia Shelling, DPM    [address] [place], [place] [address]                Office [phone]  Fax [phone]

## 2024-01-17 ENCOUNTER — Ambulatory Visit: Payer: 59 | Admitting: Urology

## 2024-01-19 ENCOUNTER — Encounter: Payer: Self-pay | Admitting: Urology

## 2024-01-19 ENCOUNTER — Ambulatory Visit: Payer: 59 | Admitting: Urology

## 2024-01-19 VITALS — BP 108/72 | HR 87 | Ht 65.0 in | Wt 213.0 lb

## 2024-01-19 DIAGNOSIS — R3129 Other microscopic hematuria: Secondary | ICD-10-CM

## 2024-01-19 DIAGNOSIS — Q6102 Congenital multiple renal cysts: Secondary | ICD-10-CM | POA: Diagnosis not present

## 2024-01-19 NOTE — Progress Notes (Signed)
 I, Maysun Anabel Bene, acting as a scribe for Chelsey Altes, MD., have documented all relevant documentation on the behalf of Chelsey Altes, MD, as directed by Chelsey Altes, MD while in the presence of Chelsey Altes, MD.  01/19/2024 11:08 AM   Truddie Coco 07-May-1951 [id number]  Referring provider: Sallyanne Kuster, NP [address] [place],  [place] [address]  Chief Complaint  Patient presents with   Results   Urologic history 1. Microhematuria UA 03/26/23 showed 3-10 RBCs per high-power field.  CT Urogram with 1.9 and 1.7 cm lesions right kidney considered indeterminate Cystoscopy showed no lower tract abnormalities.  HPI: Chelsey [name] is a 73 y.o. female who presents for a follow-up visit  No complaints since last visit. She has no bothersome low urinary tract symptoms.  MRI abdomen with and without contrast performed 10/15/23 showed the lesions in question to be Bosniak 2 cysts. Additional small Bosniak 2 cysts and simple renal cysts were noted.   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    Sleep apnea    OXYGEN AT NIGHT 3 L [place]    Surgical History: Past Surgical History:  Procedure Laterality Date   BRAIN SURGERY  1990's   CATARACT EXTRACTION W/PHACO Left 06/11/2016   Procedure: CATARACT EXTRACTION PHACO AND INTRAOCULAR LENS PLACEMENT (IOC);  Surgeon: Galen Manila, MD;  Location: [facility] ORS;  Service: Ophthalmology;  Laterality: Left;  [place] 1.01 AP% 21.6 CDE 13.23 Fluid pack lot # [id number]   CEREBRAL ANEURYSM REPAIR  1990's   COILS   CORONARY ARTERY BYPASS GRAFT     EYE SURGERY  2000's   bilateral cataract   FRACTURE SURGERY     HERNIA REPAIR  2000   ventral   STENT PLACEMENT VASCULAR ([facility] HX)     VEIN BYPASS SURGERY     VENTRAL HERNIA REPAIR N/A 04/29/2016   Procedure: Laparoscopic HERNIA REPAIR VENTRAL ADULT;  Surgeon: Leafy Ro, MD;  Location: [facility] ORS;  Service: General;  Laterality: N/A;    Home Medications:  Allergies as of 01/19/2024       Reactions   Oxycodone-acetaminophen Other (See Comments)   Hallucinations Hallucinations HALLUCINATIONS Other reaction(s): Other (See Comments) HALLUCINATIONS Other reaction(s): Other (See Comments) Hallucinations Hallucinations HALLUCINATIONS Hallucinations Other reaction(s): Other (See Comments), Other (See Comments) HALLUCINATIONS Hallucinations Hallucinations HALLUCINATIONS Other reaction(s): Other (See Comments) HALLUCINATIONS Other reaction(s): Other (See Comments) Hallucinations Hallucinations HALLUCINATIONS Hallucinations Other reaction(s): Other (See Comments) HALLUCINATIONS Other reaction(s): Other (See Comments) Hallucinations Hallucinations HALLUCINATIONS Hallucinations   Indomethacin Other (See Comments)   BURN HOLE IN STOMACH BURN HOLE IN STOMACH Other reaction(s): Other (See Comments), Other (See Comments) BURN HOLE IN STOMACH BURN HOLE IN STOMACH BURN HOLE IN  STOMACH BURN HOLE IN STOMACH BURN HOLE IN STOMACH   Penicillins Rash        Medication List        Accurate as of January 19, 2024 11:08 AM. If you have any questions, ask your nurse or doctor.          albuterol 108 (90 Base) MCG/ACT inhaler Commonly known as: VENTOLIN HFA Inhale 2 puffs into the lungs every 6 (six) hours as needed for wheezing or shortness of breath.   aspirin  EC 81 MG tablet Take 1 tablet (81 mg total) by mouth daily.   atorvastatin 10 MG tablet Commonly known as: LIPITOR TAKE ONE TABLET BY MOUTH ONCE DAILY FOR cholesterol   Baqsimi Two Pack 3 MG/DOSE Powd Generic drug: Glucagon   celecoxib 100 MG capsule Commonly known as: CELEBREX TAKE 1 CAPSULE(100 MG) BY MOUTH DAILY   chlorhexidine 0.12 % solution Commonly known as: PERIDEX   cilostazol 50 MG tablet Commonly known as: PLETAL Take 1 tablet (50 mg total) by mouth 2 (two) times daily.   Comfort EZ Pen Needles 31G X 8 MM Misc Generic drug: Insulin Pen Needle Twice a day.   dapagliflozin propanediol 10 MG Tabs tablet Commonly known as: Farxiga Take 1 tablet (10 mg total) by mouth daily.   ezetimibe 10 MG tablet Commonly known as: ZETIA Take 1 tablet (10 mg total) by mouth daily.   febuxostat 40 MG tablet Commonly known as: ULORIC Take 1 tablet (40 mg total) by mouth daily.   FeroSul 325 (65 Fe) MG tablet Generic drug: ferrous sulfate TAKE 1 TABLET BY MOUTH DAILY WITH BREAKFAST   fluticasone-salmeterol 250-50 MCG/ACT Aepb Commonly known as: ADVAIR INHALE 1 PUFF BY MOUTH INTO LUNGS IN THE MORNING and AT BEDTIME   FreeStyle Libre 2 Sensor Misc Use as directed every 15 days DXE11.65   furosemide 20 MG tablet Commonly known as: LASIX Take 1 tablet (20 mg total) by mouth daily as needed (increased swelling of legs).   gabapentin 300 MG capsule Commonly known as: NEURONTIN Take 1 capsule (300 mg total) by mouth 3 (three) times daily.   glucose blood test strip Test sugar 4 x aday for uncontrolled dm on insulin E11.22   HumuLIN 70/30 KwikPen (70-30) 100 UNIT/ML KwikPen Generic drug: insulin isophane & regular human KwikPen INJECT 25 UNITS INTO THE SKIN TWICE DAILY   hydroxychloroquine 200 MG tablet Commonly known as: PLAQUENIL Take 1 tablet (200 mg total) by mouth 2 (two) times daily.   Lancets Ultra Thin Misc Apply 1 each topically daily.   levothyroxine 50  MCG tablet Commonly known as: SYNTHROID TAKE ONE TABLET BY MOUTH EVERY MORNING ON AN EMPTY STOMACH   lidocaine 2 % solution Commonly known as: XYLOCAINE Use as directed 15 mLs in the mouth or throat 3 (three) times daily as needed for mouth pain.   Olmesartan-amLODIPine-HCTZ 40-10-25 MG Tabs Take 1 tablet by mouth daily.   omeprazole 40 MG capsule Commonly known as: PRILOSEC Take 1 capsule (40 mg total) by mouth daily.   OXYGEN Inhale 3 L into the lungs. Nighttime use   Ozempic (2 MG/DOSE) 8 MG/3ML Sopn Generic drug: Semaglutide (2 MG/DOSE) Inject 2 mg into the skin once a week.        Allergies:  Allergies  Allergen Reactions   Oxycodone-Acetaminophen Other (See Comments)    Hallucinations Hallucinations HALLUCINATIONS Other reaction(s): Other (See Comments) HALLUCINATIONS Other reaction(s): Other (See Comments) Hallucinations Hallucinations HALLUCINATIONS Hallucinations Other reaction(s): Other (See Comments), Other (See  Comments) HALLUCINATIONS Hallucinations Hallucinations HALLUCINATIONS Other reaction(s): Other (See Comments) HALLUCINATIONS Other reaction(s): Other (See Comments) Hallucinations Hallucinations HALLUCINATIONS Hallucinations Other reaction(s): Other (See Comments) HALLUCINATIONS Other reaction(s): Other (See Comments) Hallucinations Hallucinations HALLUCINATIONS Hallucinations    Indomethacin Other (See Comments)    BURN HOLE IN STOMACH BURN HOLE IN STOMACH Other reaction(s): Other (See Comments), Other (See Comments) BURN HOLE IN STOMACH BURN HOLE IN STOMACH BURN HOLE IN STOMACH BURN HOLE IN STOMACH BURN HOLE IN STOMACH    Penicillins Rash    Family History: Family History  Problem Relation Age of Onset   Heart disease Mother    Hypertension Mother    Diabetes Mother    Diabetes Father    Heart disease Father    Hypertension Father    Breast cancer Maternal Aunt     Social History:  reports that she quit smoking  about 17 years ago. Her smoking use included cigarettes. She started smoking about 48 years ago. She has a 30 pack-year smoking history. She has been exposed to tobacco smoke. She has never used smokeless tobacco. She reports that she does not drink alcohol and does not use drugs.   Physical Exam: BP 108/72   Pulse 87   Ht 5\' 5"  (1.651 m)   Wt 213 lb (96.6 kg)   BMI 35.45 kg/m   Constitutional:  Alert and oriented, No acute distress. HEENT: [place] AT, moist mucus membranes.  Trachea midline, no masses. Cardiovascular: No clubbing, cyanosis, or edema. Respiratory: Normal respiratory effort, no increased work of breathing. GI: Abdomen is soft, nontender, nondistended, no abdominal masses Skin: No rashes, bruises or suspicious lesions. Neurologic: Grossly intact, no focal deficits, moving all 4 extremities. Psychiatric: Normal mood and affect.   Pertinent Imaging: MRI was personally reviewed and interpreted.   MRI  EXAM: MRI ABDOMEN WITHOUT AND WITH CONTRAST   TECHNIQUE: Multiplanar multisequence MR imaging of the abdomen was performed both before and after the administration of intravenous contrast.   CONTRAST:  9mL GADAVIST GADOBUTROL 1 MMOL/ML IV SOLN   COMPARISON:  CT abdomen pelvis, 04/07/2023   FINDINGS: Lower chest: No acute abnormality.   Hepatobiliary: No solid liver abnormality is seen. No gallstones, gallbladder wall thickening, or biliary dilatation.   Pancreas: Unremarkable. No pancreatic ductal dilatation or surrounding inflammatory changes.   Spleen: Normal in size without significant abnormality.   Adrenals/Urinary Tract: Adrenal glands are unremarkable. Multiple intrinsically T1 hyperintense hemorrhagic or proteinaceous cysts bilaterally, including of the posterior superior pole and lateral midportion of the right kidney queried by prior CT (series 13, image 35). None of these demonstrate solid component or contrast enhancement. Multiple additional simple  fluid signal cysts. These are benign and requiring no further follow-up or characterization. No obvious calculi or hydronephrosis.   Stomach/Bowel: Stomach is within normal limits. No evidence of bowel wall thickening, distention, or inflammatory changes.   Vascular/Lymphatic: Severe aortic atherosclerosis. No enlarged abdominal lymph nodes.   Other: No abdominal wall hernia or abnormality. No ascites.   Musculoskeletal: No acute or significant osseous findings.   IMPRESSION: 1. Multiple intrinsically T1 hyperintense hemorrhagic or proteinaceous cysts bilaterally, including of the posterior superior pole and lateral midportion of the right kidney queried by prior CT. None of these demonstrate solid component or contrast enhancement. Multiple additional simple fluid signal cysts. These are benign and require no further follow-up or characterization (Bosniak category I and II). 2. Severe aortic atherosclerosis.   Aortic Atherosclerosis (ICD10-I70.0).     Electronically Signed   By: Tobey Grim  Jayme Cloud M.D.   On: 10/15/2023 16:26   Assessment & Plan:    1. 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 Findings were discussed and no need for follow-up imaging.  2. Microhematuria UAs 04/22/23 and 05/17/2023 showed no microhematuria.  Follow-up PRN.  I have reviewed the above documentation for accuracy and completeness, and I agree with the above.   Chelsey Altes, MD  [facility] [address] [place], [place] [address] [phone]

## 2024-01-21 DIAGNOSIS — K148 Other diseases of tongue: Secondary | ICD-10-CM | POA: Diagnosis not present

## 2024-01-25 ENCOUNTER — Encounter: Payer: Self-pay | Admitting: Podiatry

## 2024-01-25 ENCOUNTER — Telehealth: Payer: Self-pay | Admitting: Nurse Practitioner

## 2024-01-25 ENCOUNTER — Ambulatory Visit (INDEPENDENT_AMBULATORY_CARE_PROVIDER_SITE_OTHER): Payer: 59 | Admitting: Podiatry

## 2024-01-25 DIAGNOSIS — L600 Ingrowing nail: Secondary | ICD-10-CM

## 2024-01-25 NOTE — Telephone Encounter (Signed)
 Lvm for [facility]  vein/vacular to fax over surgical clearance-Toni

## 2024-01-25 NOTE — Progress Notes (Signed)
   Chief Complaint  Patient presents with   Nail Problem    "My toe is doing good.  When I put that shoe on, it bothers me."    Subjective: 73 y.o. female presents today status post temporary nail avulsion procedure of the medial border of the left great toe that was performed on 01/11/2024.  Patient doing well.  Significant improvement and reduction of pain..   Past Medical History:  Diagnosis Date   Anemia    Aneurysm (HCC)    Arthritis    RHEUMATOID ARTHRITIS   Asthma    Collagen vascular disease (HCC)    COPD (chronic obstructive pulmonary disease) (HCC)    Coronary artery disease    Dementia (HCC)    Diabetes mellitus without complication (HCC)    Edema    FEET/LEGS   GERD (gastroesophageal reflux disease)    Gout    H/O wheezing    History of hiatal hernia    Hypertension    Hypothyroidism    Neuropathy    Oxygen deficiency    HS   Peripheral vascular disease (HCC)    Sarcoidosis of lung (HCC)    Seizures (HCC)    WITH BRAIN 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    Sleep apnea    OXYGEN AT NIGHT 3 L [place]    Objective: Neurovascular status intact.  Skin is warm, dry and supple. Nail and respective nail fold appears to be healing appropriately.   Assessment: #1 s/p partial permanent nail matrixectomy medial border left great toe   Plan of care: #1 patient was evaluated  #2 light debridement of the periungual debris was performed to the border of the respective toe and nail plate using a tissue nipper. #3 patient is to return to clinic on a PRN basis.   Felecia Shelling, DPM [facility]  Dr. Felecia Shelling, DPM    [address] [place], [place] [address]                Office [phone]  Fax [phone]

## 2024-01-31 ENCOUNTER — Telehealth: Payer: Self-pay | Admitting: Nurse Practitioner

## 2024-01-31 ENCOUNTER — Encounter: Payer: Self-pay | Admitting: Nurse Practitioner

## 2024-01-31 ENCOUNTER — Ambulatory Visit: Admitting: Nurse Practitioner

## 2024-01-31 VITALS — BP 127/77 | HR 67 | Temp 97.6°F | Resp 16 | Ht 65.0 in | Wt 209.0 lb

## 2024-01-31 DIAGNOSIS — Z01818 Encounter for other preprocedural examination: Secondary | ICD-10-CM | POA: Diagnosis not present

## 2024-01-31 DIAGNOSIS — E1169 Type 2 diabetes mellitus with other specified complication: Secondary | ICD-10-CM | POA: Diagnosis not present

## 2024-01-31 DIAGNOSIS — I152 Hypertension secondary to endocrine disorders: Secondary | ICD-10-CM

## 2024-01-31 DIAGNOSIS — E1159 Type 2 diabetes mellitus with other circulatory complications: Secondary | ICD-10-CM

## 2024-01-31 DIAGNOSIS — E785 Hyperlipidemia, unspecified: Secondary | ICD-10-CM

## 2024-01-31 DIAGNOSIS — Z794 Long term (current) use of insulin: Secondary | ICD-10-CM

## 2024-01-31 LAB — POCT GLYCOSYLATED HEMOGLOBIN (HGB A1C): Hemoglobin A1C: 8.9 % — AB (ref 4.0–5.6)

## 2024-01-31 NOTE — Progress Notes (Signed)
 [facility] [address] [place], [place] [address]  Internal MEDICINE  Office Visit Note  Patient Name: Chelsey [name]  [id number]  [id number]  Date of Service: 01/31/2024  Chief Complaint  Patient presents with   Gastroesophageal Reflux   Diabetes   Hypertension   Follow-up    HPI Chelsey [name] presents for a follow-up visit for surgical clearance, hypertension, high cholesterol and hypothyroidism.  Patient is still seeking surgical clearance for her vascular surgery to be done by [facility] vascular surgery. Her A1c has been elevated and [facility] vascular was previously faxed a surgical clearance form instructing them to communicate with [facility] endocrinology who manages the patient's diabetes. Today, another surgical clearance form was faxed to clear patient in regards to her A1c. Her A1c was checked today and is still elevated at 8.9 and this was explained on the surgical clearance form and I also explained that they need to contact her endocrinologist at [facility] on the surgical clearance form and it was faxed back to [facility] vascular surgery.  Hypertension -- controlled with furosemide  and olmesartan -amlodipine -hydrochlorothiazide   High cholesterol -- taking zetia  and atorvastatin  Hypothyroidism -- taking levothyroxine  daily.      Current Medication: Outpatient Encounter Medications as of 01/31/2024  Medication Sig   albuterol  (VENTOLIN  HFA) 108 (90 Base) MCG/ACT inhaler Inhale 2 puffs into the lungs every 6 (six) hours as needed for wheezing or shortness of breath.   aspirin  EC 81 MG tablet Take 1 tablet (81 mg total) by mouth daily.   atorvastatin  (LIPITOR) 10 MG tablet TAKE ONE TABLET BY MOUTH ONCE DAILY FOR cholesterol   BAQSIMI TWO PACK 3 MG/DOSE POWD    celecoxib  (CELEBREX ) 100 MG capsule TAKE 1 CAPSULE(100 MG) BY MOUTH DAILY   chlorhexidine  (PERIDEX ) 0.12 % solution    cilostazol  (PLETAL ) 50 MG tablet Take 1 tablet (50 mg total) by mouth 2 (two) times daily.   Continuous Glucose  Sensor (FREESTYLE LIBRE 2 SENSOR) MISC Use as directed every 15 days DXE11.65   dapagliflozin  propanediol (FARXIGA ) 10 MG TABS tablet Take 1 tablet (10 mg total) by mouth daily.   ezetimibe  (ZETIA ) 10 MG tablet Take 1 tablet (10 mg total) by mouth daily.   febuxostat  (ULORIC ) 40 MG tablet Take 1 tablet (40 mg total) by mouth daily.   FEROSUL 325 (65 Fe) MG tablet TAKE 1 TABLET BY MOUTH DAILY WITH BREAKFAST   fluticasone -salmeterol (ADVAIR) 250-50 MCG/ACT AEPB INHALE 1 PUFF BY MOUTH INTO LUNGS IN THE MORNING and AT BEDTIME   furosemide  (LASIX ) 20 MG tablet Take 1 tablet (20 mg total) by mouth daily as needed (increased swelling of legs).   gabapentin  (NEURONTIN ) 300 MG capsule Take 1 capsule (300 mg total) by mouth 3 (three) times daily.   glucose blood test strip Test sugar 4 x aday for uncontrolled dm on insulin  E11.22   HUMULIN  70/30 KWIKPEN (70-30) 100 UNIT/ML KwikPen INJECT 25 UNITS INTO THE SKIN TWICE DAILY   hydroxychloroquine  (PLAQUENIL ) 200 MG tablet Take 1 tablet (200 mg total) by mouth 2 (two) times daily.   Insulin  Pen Needle (COMFORT EZ PEN NEEDLES) 31G X 8 MM MISC Twice a day.   LANCETS ULTRA THIN MISC Apply 1 each topically daily.    levothyroxine  (SYNTHROID ) 50 MCG tablet TAKE ONE TABLET BY MOUTH EVERY MORNING ON AN EMPTY STOMACH   lidocaine  (XYLOCAINE ) 2 % solution Use as directed 15 mLs in the mouth or throat 3 (three) times daily as needed for mouth pain.   Olmesartan -amLODIPine -HCTZ 40-10-25 MG  TABS Take 1 tablet by mouth daily.   omeprazole  (PRILOSEC) 40 MG capsule Take 1 capsule (40 mg total) by mouth daily.   OXYGEN  Inhale 3 L into the lungs. Nighttime use   OZEMPIC, 2 MG/DOSE, 8 MG/3ML SOPN Inject 2 mg into the skin once a week.   No facility-administered encounter medications on file as of 01/31/2024.    Surgical History: Past Surgical History:  Procedure Laterality Date   BRAIN SURGERY  1990's   CATARACT EXTRACTION W/PHACO Left 06/11/2016   Procedure: CATARACT  EXTRACTION PHACO AND INTRAOCULAR LENS PLACEMENT (IOC);  Surgeon: Clair Crews, MD;  Location: [facility] ORS;  Service: Ophthalmology;  Laterality: Left;  [place]  1.01 AP% 21.6 CDE 13.23 Fluid pack lot # [id number]   CEREBRAL ANEURYSM REPAIR  1990's   COILS   CORONARY ARTERY BYPASS GRAFT     EYE SURGERY  2000's   bilateral cataract   FRACTURE SURGERY     HERNIA REPAIR  2000   ventral   STENT PLACEMENT VASCULAR ([facility] HX)     VEIN BYPASS SURGERY     VENTRAL HERNIA REPAIR N/A 04/29/2016   Procedure: Laparoscopic HERNIA REPAIR VENTRAL ADULT;  Surgeon: Alben Alma, MD;  Location: [facility] ORS;  Service: General;  Laterality: N/A;    Medical History: Past Medical History:  Diagnosis Date   Anemia    Aneurysm (HCC)    Arthritis    RHEUMATOID ARTHRITIS   Asthma    Collagen vascular disease (HCC)    COPD (chronic obstructive pulmonary disease) (HCC)    Coronary artery disease    Dementia (HCC)    Diabetes mellitus without complication (HCC)    Edema    FEET/LEGS   GERD (gastroesophageal reflux disease)    Gout    H/O wheezing    History of hiatal hernia    Hypertension    Hypothyroidism    Neuropathy    Oxygen  deficiency    HS   Peripheral vascular disease (HCC)    Sarcoidosis of lung (HCC)    Seizures (HCC)    WITH BRAIN 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    Sleep apnea    OXYGEN  AT NIGHT 3 L [place]    Family History: Family History  Problem Relation Age of Onset   Heart disease Mother    Hypertension Mother    Diabetes Mother    Diabetes Father    Heart disease Father    Hypertension Father    Breast cancer Maternal Aunt     Social History   Socioeconomic History   Marital status: Single    Spouse name: Not on file   Number of children: Not on file   Years of education: Not on file   Highest education level: Not on file  Occupational History   Not on file  Tobacco Use   Smoking status: Former    Current packs/day: 0.00    Average packs/day: 1 pack/day for 30.0 years (30.0  ttl pk-yrs)    Types: Cigarettes    Start date: 01/28/1976    Quit date: 01/27/2006    Years since quitting: 18.1    Passive exposure: Past   Smokeless tobacco: Never   Tobacco comments:    quit   Vaping Use   Vaping status: Never Used  Substance and Sexual Activity   Alcohol use: No    Alcohol/week: 0.0 standard drinks of alcohol   Drug use: No   Sexual activity: Not on file  Other Topics Concern   Not on file  Social History Narrative   Not on file   Social Drivers of Health   Financial Resource Strain: Not on file  Food Insecurity: Not on file  Transportation Needs: Not on file  Physical Activity: Not on file  Stress: Not on file  Social Connections: Not on file  Intimate Partner Violence: Not on file      Review of Systems  Constitutional:  Positive for fatigue. Negative for fever.  HENT:  Negative for congestion, mouth sores and postnasal drip.   Respiratory:  Negative for cough.   Cardiovascular:  Positive for leg swelling. Negative for chest pain.  Genitourinary:  Negative for flank pain.  Musculoskeletal:  Positive for arthralgias and gait problem.  Psychiatric/Behavioral: Negative.      Vital Signs: BP 127/77   Pulse 67   Temp 97.6 F (36.4 C)   Resp 16   Ht 5\' 5"  (1.651 m)   Wt 209 lb (94.8 kg)   SpO2 92%   BMI 34.78 kg/m    Physical Exam Vitals and nursing note reviewed.  Constitutional:      General: She is not in acute distress.    Appearance: Normal appearance. She is obese. She is not ill-appearing.  HENT:     Head: Normocephalic and atraumatic.  Eyes:     Pupils: Pupils are equal, round, and reactive to light.  Cardiovascular:     Rate and Rhythm: Normal rate and regular rhythm.     Heart sounds: Normal heart sounds. No murmur heard. Pulmonary:     Effort: Pulmonary effort is normal. No respiratory distress.     Breath sounds: Normal breath sounds. No wheezing.  Abdominal:     General: Bowel sounds are normal.     Palpations:  Abdomen is soft.  Musculoskeletal:     Right lower leg: 2+ Pitting Edema present.     Left lower leg: 2+ Pitting Edema present.     Comments: Chronic left knee pain; min lower extremity swelling  Skin:    General: Skin is warm and dry.  Neurological:     Mental Status: She is alert and oriented to person, place, and time.  Psychiatric:        Mood and Affect: Mood normal.        Behavior: Behavior normal.        Assessment/Plan: 1. Type 2 diabetes mellitus with other specified complication, with long-term current use of insulin  (HCC) (Primary) Uncontrolled, A1c remains elevated at 8.9. patient instructed to follow up with [facility] endocrinology.  - POCT glycosylated hemoglobin (Hb A1C)  2. Hypertension associated with type 2 diabetes mellitus (HCC) Stable, continue furosemide  and olmesartan -amlodipine -hydrochlorothiazide  as prescribed   3. Hyperlipidemia associated with type 2 diabetes mellitus (HCC) Continue zetia  and atorvastatin  as prescribed.   4. Preoperative clearance Not medically cleared due to elevated A1c, patient instructed to follow up with [facility] endocrinology. Vascular surgery has been informed twice now to contact [facility] endocrinology who manages the patient's diabetes.    General Counseling: Eshaal verbalizes understanding of the findings of todays visit and agrees with plan of treatment. I have discussed any further diagnostic evaluation that may be needed or ordered today. We also reviewed her medications today. she has been encouraged to call the office with any questions or concerns that should arise related to todays visit.    Orders Placed This Encounter  Procedures   POCT glycosylated hemoglobin (Hb A1C)    No orders of the defined types were placed in this encounter.  Return for previously scheduled, F/U, Kaelin Holford PCP in late april.   Total time spent:30 Minutes Time spent includes review of chart, medications, test results, and follow up plan with the  patient.   [place] Controlled Substance Database was reviewed by me.  This patient was seen by Laurence Pons, FNP-C in collaboration with Dr. Verneta Gone as a part of collaborative care agreement.   Kammy Klett R. Bobbi Burow, MSN, FNP-C Internal medicine

## 2024-01-31 NOTE — Telephone Encounter (Signed)
 Surgical clearance faxed to [facility] Vascular; [phone]. Scanned-Toni

## 2024-02-04 ENCOUNTER — Ambulatory Visit: Admitting: Podiatry

## 2024-02-04 ENCOUNTER — Encounter: Payer: Self-pay | Admitting: Podiatry

## 2024-02-04 VITALS — Ht 65.0 in | Wt 209.0 lb

## 2024-02-04 DIAGNOSIS — B351 Tinea unguium: Secondary | ICD-10-CM

## 2024-02-04 DIAGNOSIS — E1159 Type 2 diabetes mellitus with other circulatory complications: Secondary | ICD-10-CM | POA: Diagnosis not present

## 2024-02-04 DIAGNOSIS — M2142 Flat foot [pes planus] (acquired), left foot: Secondary | ICD-10-CM

## 2024-02-04 DIAGNOSIS — L84 Corns and callosities: Secondary | ICD-10-CM

## 2024-02-04 DIAGNOSIS — E119 Type 2 diabetes mellitus without complications: Secondary | ICD-10-CM | POA: Diagnosis not present

## 2024-02-04 DIAGNOSIS — I872 Venous insufficiency (chronic) (peripheral): Secondary | ICD-10-CM | POA: Diagnosis not present

## 2024-02-04 DIAGNOSIS — M2141 Flat foot [pes planus] (acquired), right foot: Secondary | ICD-10-CM | POA: Diagnosis not present

## 2024-02-04 DIAGNOSIS — M79676 Pain in unspecified toe(s): Secondary | ICD-10-CM | POA: Diagnosis not present

## 2024-02-04 NOTE — Progress Notes (Signed)
 ANNUAL DIABETIC FOOT EXAM  Subjective: Chelsey [name] presents today for annual diabetic foot exam. Chief Complaint  Patient presents with   Nail Problem    Pt is here for [facility] last A1C was 8 PCP is Dr Welton Flakes and LOV was last year.   Patient confirms h/o diabetes.  Patient denies any h/o foot wounds.  Patient has been diagnosed with neuropathy.  Chelsey Kuster, NP is patient's PCP. LOV 01/31/2024.  Past Medical History:  Diagnosis Date   Anemia    Aneurysm (HCC)    Arthritis    RHEUMATOID ARTHRITIS   Asthma    Collagen vascular disease (HCC)    COPD (chronic obstructive pulmonary disease) (HCC)    Coronary artery disease    Dementia (HCC)    Diabetes mellitus without complication (HCC)    Edema    FEET/LEGS   GERD (gastroesophageal reflux disease)    Gout    H/O wheezing    History of hiatal hernia    Hypertension    Hypothyroidism    Neuropathy    Oxygen deficiency    HS   Peripheral vascular disease (HCC)    Sarcoidosis of lung (HCC)    Seizures (HCC)    WITH BRAIN 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    Sleep apnea    OXYGEN AT NIGHT 3 L [place]   Patient Active Problem List   Diagnosis Date Noted   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   Osteoarthritis of left knee 02/24/2023   Peripheral neuropathy 11/26/2022   Bilateral lower extremity edema 10/06/2022   Uncontrolled type 2 diabetes mellitus with hyperglycemia (HCC) 09/18/2021   Acute metabolic encephalopathy 09/17/2021   AMS (altered mental status) 09/16/2021   Morbid obesity (HCC) 05/28/2021   Hyperparathyroidism due to renal insufficiency (HCC) 04/16/2020   Stage 3b chronic kidney disease (HCC) 04/16/2020   Complex renal cyst 10/18/2019   Urinary tract infection without hematuria 08/02/2019   Dysuria 08/02/2019   Essential hypertension 08/02/2019   Right upper quadrant pain 12/07/2017   Iron deficiency anemia secondary to blood loss (chronic) 10/06/2017   Pain in right finger(s)  07/07/2017   Chronic pain of left wrist 03/04/2017   Pain in wrist 03/04/2017   Chronic pain of left ankle 11/03/2016   Chronic pain of left knee 11/03/2016   Right renal mass 11/03/2016   Hyperlipemia 07/29/2016   Recurrent ventral hernia    Ventral hernia without obstruction or gangrene 03/25/2016   Type 2 diab w hyprosm w/o nonket hyprgly-hypros coma [facility]) (HCC)    Foot pain 12/10/2015   Chronic gouty arthropathy without tophi 12/10/2015   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/10/2015   Sarcoidosis of lung (HCC) 12/10/2015   Sarcoidosis, nodular type 12/10/2015   Chronic foot pain, left 12/10/2015   Congenital pes planus of left foot 12/10/2015   Chronic idiopathic gout of multiple sites 08/05/2015   Sleep related hypoventilation/hypoxemia in other disease 04/29/2015   NAFLD (nonalcoholic fatty liver disease) 78/29/5621   Hypothyroidism due to acquired atrophy of thyroid 07/11/2014   Primary localized osteoarthrosis, lower leg 10/21/2012   Sarcoidosis 05/24/2008   Peripheral vascular disease (HCC) 02/15/2007   Past Surgical History:  Procedure Laterality Date   BRAIN SURGERY  1990's   CATARACT EXTRACTION W/PHACO Left 06/11/2016   Procedure: CATARACT EXTRACTION PHACO AND INTRAOCULAR LENS PLACEMENT (IOC);  Surgeon: Galen Manila, MD;  Location: [facility] ORS;  Service: Ophthalmology;  Laterality: Left;  [place] 1.01 AP% 21.6 CDE 13.23 Fluid pack lot #  [id number]   CEREBRAL ANEURYSM REPAIR  1990's   COILS   CORONARY ARTERY BYPASS GRAFT     EYE SURGERY  2000's   bilateral cataract   FRACTURE SURGERY     HERNIA REPAIR  2000   ventral   STENT PLACEMENT VASCULAR ([facility] HX)     VEIN BYPASS SURGERY     VENTRAL HERNIA REPAIR N/A 04/29/2016   Procedure: Laparoscopic HERNIA REPAIR VENTRAL ADULT;  Surgeon: Leafy Ro, MD;  Location: [facility] ORS;  Service: General;  Laterality: N/A;   Current Outpatient Medications on File Prior to Visit  Medication Sig Dispense Refill   albuterol  (VENTOLIN HFA) 108 (90 Base) MCG/ACT inhaler Inhale 2 puffs into the lungs every 6 (six) hours as needed for wheezing or shortness of breath. 18 g 3   aspirin EC 81 MG tablet Take 1 tablet (81 mg total) by mouth daily. 90 tablet 1   atorvastatin (LIPITOR) 10 MG tablet TAKE ONE TABLET BY MOUTH ONCE DAILY FOR cholesterol 90 tablet 3   BAQSIMI TWO PACK 3 MG/DOSE POWD      celecoxib (CELEBREX) 100 MG capsule TAKE 1 CAPSULE(100 MG) BY MOUTH DAILY 90 capsule 1   chlorhexidine (PERIDEX) 0.12 % solution      cilostazol (PLETAL) 50 MG tablet Take 1 tablet (50 mg total) by mouth 2 (two) times daily. 180 tablet 1   Continuous Glucose Sensor (FREESTYLE LIBRE 2 SENSOR) MISC Use as directed every 15 days DXE11.65 2 each 11   dapagliflozin propanediol (FARXIGA) 10 MG TABS tablet Take 1 tablet (10 mg total) by mouth daily. 90 tablet 1   ezetimibe (ZETIA) 10 MG tablet Take 1 tablet (10 mg total) by mouth daily. 90 tablet 1   febuxostat (ULORIC) 40 MG tablet Take 1 tablet (40 mg total) by mouth daily. 90 tablet 3   FEROSUL 325 (65 Fe) MG tablet TAKE 1 TABLET BY MOUTH DAILY WITH BREAKFAST 90 tablet 1   fluticasone-salmeterol (ADVAIR) 250-50 MCG/ACT AEPB INHALE 1 PUFF BY MOUTH INTO LUNGS IN THE MORNING and AT BEDTIME 60 each 2   furosemide (LASIX) 20 MG tablet Take 1 tablet (20 mg total) by mouth daily as needed (increased swelling of legs). 30 tablet 3   gabapentin (NEURONTIN) 300 MG capsule Take 1 capsule (300 mg total) by mouth 3 (three) times daily. 270 capsule 0   glucose blood test strip Test sugar 4 x aday for uncontrolled dm on insulin E11.22 120 each 12   HUMULIN 70/30 KWIKPEN (70-30) 100 UNIT/ML KwikPen INJECT 25 UNITS INTO THE SKIN TWICE DAILY 15 mL 1   hydroxychloroquine (PLAQUENIL) 200 MG tablet Take 1 tablet (200 mg total) by mouth 2 (two) times daily. 180 tablet 1   Insulin Pen Needle (COMFORT EZ PEN NEEDLES) 31G X 8 MM MISC Twice a day. 300 each 3   LANCETS ULTRA THIN MISC Apply 1 each topically  daily.      levothyroxine (SYNTHROID) 50 MCG tablet TAKE ONE TABLET BY MOUTH EVERY MORNING ON AN EMPTY STOMACH 90 tablet 3   lidocaine (XYLOCAINE) 2 % solution Use as directed 15 mLs in the mouth or throat 3 (three) times daily as needed for mouth pain. 300 mL 3   Olmesartan-amLODIPine-HCTZ 40-10-25 MG TABS Take 1 tablet by mouth daily. 90 tablet 1   omeprazole (PRILOSEC) 40 MG capsule Take 1 capsule (40 mg total) by mouth daily. 90 capsule 3   OXYGEN Inhale 3 L into the lungs. Nighttime use  OZEMPIC, 2 MG/DOSE, 8 MG/3ML SOPN Inject 2 mg into the skin once a week.     No current facility-administered medications on file prior to visit.    Allergies  Allergen Reactions   Oxycodone-Acetaminophen Other (See Comments)    Hallucinations Hallucinations HALLUCINATIONS Other reaction(s): Other (See Comments) HALLUCINATIONS Other reaction(s): Other (See Comments) Hallucinations Hallucinations HALLUCINATIONS Hallucinations Other reaction(s): Other (See Comments), Other (See Comments) HALLUCINATIONS Hallucinations Hallucinations HALLUCINATIONS Other reaction(s): Other (See Comments) HALLUCINATIONS Other reaction(s): Other (See Comments) Hallucinations Hallucinations HALLUCINATIONS Hallucinations Other reaction(s): Other (See Comments) HALLUCINATIONS Other reaction(s): Other (See Comments) Hallucinations Hallucinations HALLUCINATIONS Hallucinations    Indomethacin Other (See Comments)    BURN HOLE IN STOMACH BURN HOLE IN STOMACH Other reaction(s): Other (See Comments), Other (See Comments) BURN HOLE IN STOMACH BURN HOLE IN STOMACH BURN HOLE IN STOMACH BURN HOLE IN STOMACH BURN HOLE IN STOMACH    Penicillins Rash   Social History   Occupational History   Not on file  Tobacco Use   Smoking status: Former    Current packs/day: 0.00    Average packs/day: 1 pack/day for 30.0 years (30.0 ttl pk-yrs)    Types: Cigarettes    Start date: 01/28/1976    Quit date:  01/27/2006    Years since quitting: 18.0    Passive exposure: Past   Smokeless tobacco: Never   Tobacco comments:    quit   Vaping Use   Vaping status: Never Used  Substance and Sexual Activity   Alcohol use: No    Alcohol/week: 0.0 standard drinks of alcohol   Drug use: No   Sexual activity: Not on file   Family History  Problem Relation Age of Onset   Heart disease Mother    Hypertension Mother    Diabetes Mother    Diabetes Father    Heart disease Father    Hypertension Father    Breast cancer Maternal Aunt    Immunization History  Administered Date(s) Administered   H1N1 12/07/2008   Hep A / Hep B 07/06/2012   Hepatitis B, ADULT 10/10/2014, 08/05/2015   Influenza Inj Mdck Quad Pf 09/04/2020, 08/27/2021, 07/31/2022   Influenza, High Dose Seasonal PF 08/25/2017, 07/29/2019   Influenza, Mdck, Trivalent,PF 6+ MOS(egg free) 08/11/2023   Influenza, Seasonal, Injecte, Preservative Fre 09/29/2006, 08/24/2007, 08/29/2008, 10/08/2009, 11/26/2010, 10/07/2011, 10/24/2012, 09/05/2013   Influenza,inj,Quad PF,6+ Mos 10/10/2014, 12/23/2015, 08/05/2018   Influenza,inj,Quad PF,6-35 Mos 08/13/2016   Influenza,inj,quad, With Preservative 07/23/2018   Influenza-Unspecified 08/25/2017, 07/23/2018   Moderna SARS-COV2 Booster Vaccination 05/16/2021   Moderna Sars-Covid-2 Vaccination 12/27/2019, 01/24/2020, 10/14/2020, 09/17/2022   Pneumococcal Conjugate-13 10/24/2012   Pneumococcal Polysaccharide-23 10/08/2009, 08/05/2015   Tdap 01/08/2012     Review of Systems: Negative except as noted in the HPI.   Objective: There were no vitals filed for this visit.  Chelsey [name] is a pleasant 73 y.o. female in NAD. AAO X 3.  Diabetic foot exam was performed with the following findings:   Vascular Examination: CFT <3 seconds b/l. DP/PT pulses faintly palpable b/l. Skin temperature gradient warm to warm b/l. No pain with calf compression. Evidence of skin changes consistent with long term  venous stasis BLE. No ischemia or gangrene. No cyanosis or clubbing noted b/l.    Neurological Examination: Protective sensation diminished with 10g monofilament b/l.  Dermatological Examination: Pedal skin thin, shiny and atrophic b/l LE. Toenails 1-5 b/l well maintained with adequate length. No erythema, no edema, no drainage, no fluctuance. Evidence of partial matrixectomy medial border left hallux. Hyperkeratotic  lesion(s) bilateral great toes and 5th metatarsal head left foot.  No erythema, no edema, no drainage, no fluctuance.  Musculoskeletal Examination: Muscle strength 5/5 to all lower extremity muscle groups bilaterally. Pes planus deformity noted bilateral LE.  Radiographs: None     Lab Results  Component Value Date   HGBA1C 8.9 (A) 01/31/2024   ADA Risk Categorization: High Risk  Patient has one or more of the following: Loss of protective sensation Absent pedal pulses Severe Foot deformity History of foot ulcer  Assessment: 1. Pain due to onychomycosis of toenail   2. Callus   3. Pes planus of both feet   4. Chronic venous insufficiency   5. Type 2 diabetes mellitus with vascular disease (HCC)   6. Encounter for diabetic foot exam [facility])     Plan: -Patient was evaluated today. All questions/concerns addressed on today's visit. -Diabetic foot examination performed today. -Continue diabetic foot care principles: inspect feet daily, monitor glucose as recommended by PCP and/or Endocrinologist, and follow prescribed diet per PCP, Endocrinologist and/or dietician. -Patient to continue soft, supportive shoe gear daily. -Toenails 1-5 b/l were debrided in length and girth with sterile nail nippers and dremel without iatrogenic bleeding.  -Callus(es) bilateral great toes and 5th metatarsal head left foot pared utilizing sterile scalpel blade without complication or incident. Total number debrided =3. -Patient/POA to call should there be question/concern in the  interim. Return in about 3 months (around 05/06/2024).  Freddie Breech, DPM      [facility] LOCATION: [address], [place] [address]                   Office [phone]   [facility] LOCATION: [address] [place], [place] [address] Office [phone]

## 2024-02-16 DIAGNOSIS — E118 Type 2 diabetes mellitus with unspecified complications: Secondary | ICD-10-CM | POA: Diagnosis not present

## 2024-02-28 DIAGNOSIS — E118 Type 2 diabetes mellitus with unspecified complications: Secondary | ICD-10-CM | POA: Diagnosis not present

## 2024-02-29 DIAGNOSIS — L731 Pseudofolliculitis barbae: Secondary | ICD-10-CM | POA: Diagnosis not present

## 2024-03-03 DIAGNOSIS — L731 Pseudofolliculitis barbae: Secondary | ICD-10-CM | POA: Diagnosis not present

## 2024-03-03 DIAGNOSIS — L910 Hypertrophic scar: Secondary | ICD-10-CM | POA: Diagnosis not present

## 2024-03-11 ENCOUNTER — Encounter: Payer: Self-pay | Admitting: Nurse Practitioner

## 2024-03-14 ENCOUNTER — Other Ambulatory Visit: Payer: Self-pay | Admitting: Nurse Practitioner

## 2024-03-14 DIAGNOSIS — Z7984 Long term (current) use of oral hypoglycemic drugs: Secondary | ICD-10-CM | POA: Diagnosis not present

## 2024-03-14 DIAGNOSIS — I10 Essential (primary) hypertension: Secondary | ICD-10-CM | POA: Diagnosis not present

## 2024-03-14 DIAGNOSIS — I739 Peripheral vascular disease, unspecified: Secondary | ICD-10-CM | POA: Diagnosis not present

## 2024-03-14 DIAGNOSIS — E119 Type 2 diabetes mellitus without complications: Secondary | ICD-10-CM | POA: Diagnosis not present

## 2024-03-14 DIAGNOSIS — M7989 Other specified soft tissue disorders: Secondary | ICD-10-CM | POA: Diagnosis not present

## 2024-03-14 DIAGNOSIS — Z87891 Personal history of nicotine dependence: Secondary | ICD-10-CM | POA: Diagnosis not present

## 2024-03-14 DIAGNOSIS — M1990 Unspecified osteoarthritis, unspecified site: Secondary | ICD-10-CM | POA: Diagnosis not present

## 2024-03-14 DIAGNOSIS — T82898S Other specified complication of vascular prosthetic devices, implants and grafts, sequela: Secondary | ICD-10-CM | POA: Diagnosis not present

## 2024-03-14 DIAGNOSIS — J449 Chronic obstructive pulmonary disease, unspecified: Secondary | ICD-10-CM | POA: Diagnosis not present

## 2024-03-15 ENCOUNTER — Telehealth: Payer: Self-pay | Admitting: Nurse Practitioner

## 2024-03-15 ENCOUNTER — Ambulatory Visit (INDEPENDENT_AMBULATORY_CARE_PROVIDER_SITE_OTHER): Payer: 59 | Admitting: Nurse Practitioner

## 2024-03-15 ENCOUNTER — Encounter: Payer: Self-pay | Admitting: Nurse Practitioner

## 2024-03-15 VITALS — BP 115/80 | HR 70 | Temp 98.2°F | Resp 16 | Ht 65.0 in | Wt 212.0 lb

## 2024-03-15 DIAGNOSIS — I739 Peripheral vascular disease, unspecified: Secondary | ICD-10-CM

## 2024-03-15 DIAGNOSIS — Z79899 Other long term (current) drug therapy: Secondary | ICD-10-CM | POA: Diagnosis not present

## 2024-03-15 DIAGNOSIS — D869 Sarcoidosis, unspecified: Secondary | ICD-10-CM

## 2024-03-15 DIAGNOSIS — E1159 Type 2 diabetes mellitus with other circulatory complications: Secondary | ICD-10-CM

## 2024-03-15 DIAGNOSIS — G63 Polyneuropathy in diseases classified elsewhere: Secondary | ICD-10-CM

## 2024-03-15 DIAGNOSIS — I152 Hypertension secondary to endocrine disorders: Secondary | ICD-10-CM

## 2024-03-15 DIAGNOSIS — Z794 Long term (current) use of insulin: Secondary | ICD-10-CM | POA: Diagnosis not present

## 2024-03-15 DIAGNOSIS — E785 Hyperlipidemia, unspecified: Secondary | ICD-10-CM | POA: Diagnosis not present

## 2024-03-15 DIAGNOSIS — E1169 Type 2 diabetes mellitus with other specified complication: Secondary | ICD-10-CM

## 2024-03-15 DIAGNOSIS — R229 Localized swelling, mass and lump, unspecified: Secondary | ICD-10-CM

## 2024-03-15 DIAGNOSIS — Z1231 Encounter for screening mammogram for malignant neoplasm of breast: Secondary | ICD-10-CM

## 2024-03-15 MED ORDER — EZETIMIBE 10 MG PO TABS
10.0000 mg | ORAL_TABLET | Freq: Every day | ORAL | 1 refills | Status: DC
Start: 2024-03-15 — End: 2024-05-17

## 2024-03-15 MED ORDER — OLMESARTAN-AMLODIPINE-HCTZ 40-10-25 MG PO TABS: 1.0000 | ORAL_TABLET | Freq: Every day | ORAL | 1 refills | Status: DC

## 2024-03-15 MED ORDER — CELECOXIB 100 MG PO CAPS: ORAL_CAPSULE | ORAL | 1 refills | Status: DC

## 2024-03-15 MED ORDER — FUROSEMIDE 20 MG PO TABS: 20.0000 mg | ORAL_TABLET | Freq: Every day | ORAL | 3 refills | Status: DC | PRN

## 2024-03-15 MED ORDER — HYDROXYCHLOROQUINE SULFATE 200 MG PO TABS: 200.0000 mg | ORAL_TABLET | Freq: Two times a day (BID) | ORAL | 1 refills | Status: DC

## 2024-03-15 MED ORDER — GABAPENTIN 300 MG PO CAPS: 300.0000 mg | ORAL_CAPSULE | Freq: Three times a day (TID) | ORAL | 0 refills | Status: DC

## 2024-03-15 MED ORDER — DAPAGLIFLOZIN PROPANEDIOL 10 MG PO TABS: 10.0000 mg | ORAL_TABLET | Freq: Every day | ORAL | 1 refills | Status: DC

## 2024-03-15 MED ORDER — LINACLOTIDE 145 MCG PO CAPS: 145.0000 ug | ORAL_CAPSULE | Freq: Every day | ORAL | 4 refills | Status: DC

## 2024-03-15 NOTE — Telephone Encounter (Signed)
 Notified patient of mammo appointment date, arrival time, location -Sheralyn Boatman

## 2024-03-15 NOTE — Progress Notes (Signed)
 [facility] [address] [place], [place] [address]  Internal MEDICINE  Office Visit Note  Patient Name: Chelsey [name]  [id number]  [id number]  Date of Service: 03/15/2024  Chief Complaint  Patient presents with   Diabetes   Gastroesophageal Reflux   Hypertension   Follow-up    HPI Chelsey [name] presents for a follow-up visit for medication refills and subcutaneous nodules.  Seen by [facility] vascular surgery -- physical therapy referral ordered by them for preop strengthening and conditioning while waiting for her A1c to improve to a level that is ok for surgery.  Has not needed advair or albuterol  inhaler in a long time Reports feeling tender generalized nodules that start at her left lower leg and go up her leg, up her arms and in her neck and then they stop. 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 This may be the culprit but there could be other causes  Current medication list and her medication list from [facility] do not match up. Reviewed her list and updated her list. She will take her medication list when she goes to [facility] next time and have them update their list since they had a lot of old prescription on it that she is no longer taking.  Overdue for routine mammogram    Current Medication: Outpatient Encounter Medications as of 03/15/2024  Medication Sig   aspirin  EC 81 MG tablet Take 1 tablet (81 mg total) by mouth daily.   atorvastatin  (LIPITOR) 10 MG tablet TAKE ONE TABLET BY MOUTH ONCE DAILY FOR cholesterol   BAQSIMI TWO PACK 3 MG/DOSE POWD    chlorhexidine  (PERIDEX ) 0.12 % solution    cilostazol  (PLETAL ) 50 MG tablet Take 1 tablet (50 mg total) by mouth 2 (two) times daily.   Continuous Glucose Sensor (FREESTYLE LIBRE 2 SENSOR) MISC Use as directed every 15 days DXE11.65   febuxostat  (ULORIC ) 40 MG tablet Take 1 tablet (40 mg total) by mouth daily.   FEROSUL 325 (65 Fe) MG tablet TAKE 1 TABLET BY MOUTH DAILY WITH BREAKFAST   fluticasone -salmeterol (ADVAIR)  250-50 MCG/ACT AEPB INHALE 1 PUFF BY MOUTH INTO LUNGS IN THE MORNING and AT BEDTIME   glucose blood test strip Test sugar 4 x aday for uncontrolled dm on insulin  E11.22   insulin  lispro (HUMALOG ) 100 UNIT/ML KwikPen Inject 15 units three times daily before each meal   Insulin  Pen Needle (COMFORT EZ PEN NEEDLES) 31G X 8 MM MISC Twice a day.   LANCETS ULTRA THIN MISC Apply 1 each topically daily.    LANTUS  SOLOSTAR 100 UNIT/ML Solostar Pen Inject 50 units once daily, at the same time every day   levothyroxine  (SYNTHROID ) 50 MCG tablet TAKE ONE TABLET BY MOUTH EVERY MORNING ON AN EMPTY STOMACH   lidocaine  (XYLOCAINE ) 2 % solution Use as directed 15 mLs in the mouth or throat 3 (three) times daily as needed for mouth pain.   linaclotide  (LINZESS ) 145 MCG CAPS capsule Take 1 capsule (145 mcg total) by mouth daily before breakfast.   omeprazole  (PRILOSEC) 40 MG capsule Take 1 capsule (40 mg total) by mouth daily.   OXYGEN  Inhale 3 L into the lungs. Nighttime use   OZEMPIC, 2 MG/DOSE, 8 MG/3ML SOPN Inject 2 mg into the skin once a week.   [DISCONTINUED] albuterol  (VENTOLIN  HFA) 108 (90 Base) MCG/ACT inhaler Inhale 2 puffs into the lungs every 6 (six) hours as needed for wheezing or shortness of breath.   [DISCONTINUED] celecoxib  (CELEBREX ) 100 MG capsule  TAKE 1 CAPSULE(100 MG) BY MOUTH DAILY   [DISCONTINUED] dapagliflozin  propanediol (FARXIGA ) 10 MG TABS tablet Take 1 tablet (10 mg total) by mouth daily.   [DISCONTINUED] ezetimibe  (ZETIA ) 10 MG tablet Take 1 tablet (10 mg total) by mouth daily.   [DISCONTINUED] furosemide  (LASIX ) 20 MG tablet Take 1 tablet (20 mg total) by mouth daily as needed (increased swelling of legs).   [DISCONTINUED] gabapentin  (NEURONTIN ) 300 MG capsule Take 1 capsule (300 mg total) by mouth 3 (three) times daily.   [DISCONTINUED] HUMULIN  70/30 KWIKPEN (70-30) 100 UNIT/ML KwikPen INJECT 25 UNITS INTO THE SKIN TWICE DAILY   [DISCONTINUED] hydroxychloroquine  (PLAQUENIL ) 200 MG  tablet Take 1 tablet (200 mg total) by mouth 2 (two) times daily.   [DISCONTINUED] Olmesartan -amLODIPine -HCTZ 40-10-25 MG TABS Take 1 tablet by mouth daily.   celecoxib  (CELEBREX ) 100 MG capsule TAKE 1 CAPSULE(100 MG) BY MOUTH DAILY   dapagliflozin  propanediol (FARXIGA ) 10 MG TABS tablet Take 1 tablet (10 mg total) by mouth daily.   ezetimibe  (ZETIA ) 10 MG tablet Take 1 tablet (10 mg total) by mouth daily.   furosemide  (LASIX ) 20 MG tablet Take 1 tablet (20 mg total) by mouth daily as needed (increased swelling of legs).   gabapentin  (NEURONTIN ) 300 MG capsule Take 1 capsule (300 mg total) by mouth 3 (three) times daily.   hydroxychloroquine  (PLAQUENIL ) 200 MG tablet Take 1 tablet (200 mg total) by mouth 2 (two) times daily.   Olmesartan -amLODIPine -HCTZ 40-10-25 MG TABS Take 1 tablet by mouth daily.   No facility-administered encounter medications on file as of 03/15/2024.    Surgical History: Past Surgical History:  Procedure Laterality Date   BRAIN SURGERY  1990's   CATARACT EXTRACTION W/PHACO Left 06/11/2016   Procedure: CATARACT EXTRACTION PHACO AND INTRAOCULAR LENS PLACEMENT (IOC);  Surgeon: Clair Crews, MD;  Location: [facility] ORS;  Service: Ophthalmology;  Laterality: Left;  US  1.01 AP% 21.6 CDE 13.23 Fluid pack lot # [id number]   CEREBRAL ANEURYSM REPAIR  1990's   COILS   CORONARY ARTERY BYPASS GRAFT     EYE SURGERY  2000's   bilateral cataract   FRACTURE SURGERY     HERNIA REPAIR  2000   ventral   STENT PLACEMENT VASCULAR ([facility] HX)     VEIN BYPASS SURGERY     VENTRAL HERNIA REPAIR N/A 04/29/2016   Procedure: Laparoscopic HERNIA REPAIR VENTRAL ADULT;  Surgeon: Alben Alma, MD;  Location: [facility] ORS;  Service: General;  Laterality: N/A;    Medical History: Past Medical History:  Diagnosis Date   Anemia    Aneurysm (HCC)    Arthritis    RHEUMATOID ARTHRITIS   Asthma    Collagen vascular disease (HCC)    COPD (chronic obstructive pulmonary disease) (HCC)    Coronary  artery disease    Dementia (HCC)    Diabetes mellitus without complication (HCC)    Edema    FEET/LEGS   GERD (gastroesophageal reflux disease)    Gout    H/O wheezing    History of hiatal hernia    Hypertension    Hypothyroidism    Neuropathy    Oxygen  deficiency    HS   Peripheral vascular disease (HCC)    Sarcoidosis of lung (HCC)    Seizures (HCC)    WITH BRAIN 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    Sleep apnea    OXYGEN  AT NIGHT 3 L [place]    Family History: Family History  Problem Relation Age of Onset   Heart disease Mother  Hypertension Mother    Diabetes Mother    Diabetes Father    Heart disease Father    Hypertension Father    Breast cancer Maternal Aunt     Social History   Socioeconomic History   Marital status: Single    Spouse name: Not on file   Number of children: Not on file   Years of education: Not on file   Highest education level: Not on file  Occupational History   Not on file  Tobacco Use   Smoking status: Former    Current packs/day: 0.00    Average packs/day: 1 pack/day for 30.0 years (30.0 ttl pk-yrs)    Types: Cigarettes    Start date: 01/28/1976    Quit date: 01/27/2006    Years since quitting: 18.1    Passive exposure: Past   Smokeless tobacco: Never   Tobacco comments:    quit   Vaping Use   Vaping status: Never Used  Substance and Sexual Activity   Alcohol use: No    Alcohol/week: 0.0 standard drinks of alcohol   Drug use: No   Sexual activity: Not on file  Other Topics Concern   Not on file  Social History Narrative   Not on file   Social Drivers of Health   Financial Resource Strain: Not on file  Food Insecurity: Not on file  Transportation Needs: Not on file  Physical Activity: Not on file  Stress: Not on file  Social Connections: Not on file  Intimate Partner Violence: Not on file      Review of Systems  Constitutional:  Positive for fatigue. Negative for fever.  HENT:  Negative for congestion, mouth sores and  postnasal drip.   Respiratory:  Negative for cough.   Cardiovascular:  Positive for leg swelling. Negative for chest pain.  Genitourinary:  Negative for flank pain.  Musculoskeletal:  Positive for arthralgias and gait problem.  Psychiatric/Behavioral: Negative.      Vital Signs: BP 115/80   Pulse 70   Temp 98.2 F (36.8 C)   Resp 16   Ht 5\' 5"  (1.651 m)   Wt 212 lb (96.2 kg)   SpO2 95%   BMI 35.28 kg/m    Physical Exam Vitals and nursing note reviewed.  Constitutional:      General: She is not in acute distress.    Appearance: Normal appearance. She is obese. She is not ill-appearing.  HENT:     Head: Normocephalic and atraumatic.  Eyes:     Pupils: Pupils are equal, round, and reactive to light.  Cardiovascular:     Rate and Rhythm: Normal rate and regular rhythm.     Heart sounds: Normal heart sounds. No murmur heard. Pulmonary:     Effort: Pulmonary effort is normal. No respiratory distress.     Breath sounds: Normal breath sounds. No wheezing.  Abdominal:     General: Bowel sounds are normal.     Palpations: Abdomen is soft.  Musculoskeletal:     Right lower leg: 2+ Pitting Edema present.     Left lower leg: 2+ Pitting Edema present.     Comments: Chronic left knee pain; min lower extremity swelling  Skin:    General: Skin is warm and dry.     Capillary Refill: Capillary refill takes less than 2 seconds.  Neurological:     Mental Status: She is alert and oriented to person, place, and time.  Psychiatric:        Mood and Affect:  Mood normal.        Behavior: Behavior normal.        Assessment/Plan: 1. Hypertension associated with type 2 diabetes mellitus (HCC) (Primary) Stable continue current medications as prescribed, refills ordered.  - Olmesartan -amLODIPine -HCTZ 40-10-25 MG TABS; Take 1 tablet by mouth daily.  Dispense: 90 tablet; Refill: 1 - furosemide  (LASIX ) 20 MG tablet; Take 1 tablet (20 mg total) by mouth daily as needed (increased swelling of  legs).  Dispense: 30 tablet; Refill: 3  2. Hyperlipidemia associated with type 2 diabetes mellitus (HCC) Continue zetia  as prescribed.  - ezetimibe  (ZETIA ) 10 MG tablet; Take 1 tablet (10 mg total) by mouth daily.  Dispense: 90 tablet; Refill: 1  3. Type 2 diabetes mellitus with other specified complication, with long-term current use of insulin  [facility]) Med list updated according to current treatment changes per [facility] endocrinology.  - LANTUS  SOLOSTAR 100 UNIT/ML Solostar Pen; Inject 50 units once daily, at the same time every day - insulin  lispro (HUMALOG ) 100 UNIT/ML KwikPen; Inject 15 units three times daily before each meal - dapagliflozin  propanediol (FARXIGA ) 10 MG TABS tablet; Take 1 tablet (10 mg total) by mouth daily.  Dispense: 90 tablet; Refill: 1  4. Peripheral vascular disease (HCC) Waiting for procedure for left leg but A1c is still too for surgery, will go to physical therapy in the meantime.   5. Subcutaneous nodules, generalized Ultrasound ordered to evaluate subcutaneous nodules.  - US  SOFT TISSUE RT UPPER EXTREMITY LTD (NON-VASCULAR); Future  6. Sarcoidosis, nodular type Ultrasound of right upper extremity for subcutaneous nodules that are tender.  - hydroxychloroquine  (PLAQUENIL ) 200 MG tablet; Take 1 tablet (200 mg total) by mouth 2 (two) times daily.  Dispense: 180 tablet; Refill: 1 - US  SOFT TISSUE RT UPPER EXTREMITY LTD (NON-VASCULAR); Future  7. Polyneuropathy associated with underlying disease (HCC) Continue gabapentin  as prescribed  - gabapentin  (NEURONTIN ) 300 MG capsule; Take 1 capsule (300 mg total) by mouth 3 (three) times daily.  Dispense: 270 capsule; Refill: 0  8. Encounter for medication review Medication list reviewed and compared with her medication list from [facility]. List was updated and refills ordered.  - celecoxib  (CELEBREX ) 100 MG capsule; TAKE 1 CAPSULE(100 MG) BY MOUTH DAILY  Dispense: 90 capsule; Refill: 1 - linaclotide  (LINZESS ) 145 MCG CAPS  capsule; Take 1 capsule (145 mcg total) by mouth daily before breakfast.  Dispense: 30 capsule; Refill: 4  9. Encounter for screening mammogram for malignant neoplasm of breast Routine mammogram ordered  - MM 3D SCREENING MAMMOGRAM BILATERAL BREAST; Future   General Counseling: Talli verbalizes understanding of the findings of todays visit and agrees with plan of treatment. I have discussed any further diagnostic evaluation that may be needed or ordered today. We also reviewed her medications today. she has been encouraged to call the office with any questions or concerns that should arise related to todays visit.    Orders Placed This Encounter  Procedures   MM 3D SCREENING MAMMOGRAM BILATERAL BREAST   US  SOFT TISSUE RT UPPER EXTREMITY LTD (NON-VASCULAR)    Meds ordered this encounter  Medications   dapagliflozin  propanediol (FARXIGA ) 10 MG TABS tablet    Sig: Take 1 tablet (10 mg total) by mouth daily.    Dispense:  90 tablet    Refill:  1   ezetimibe  (ZETIA ) 10 MG tablet    Sig: Take 1 tablet (10 mg total) by mouth daily.    Dispense:  90 tablet    Refill:  1  Olmesartan -amLODIPine -HCTZ 40-10-25 MG TABS    Sig: Take 1 tablet by mouth daily.    Dispense:  90 tablet    Refill:  1   furosemide  (LASIX ) 20 MG tablet    Sig: Take 1 tablet (20 mg total) by mouth daily as needed (increased swelling of legs).    Dispense:  30 tablet    Refill:  3   hydroxychloroquine  (PLAQUENIL ) 200 MG tablet    Sig: Take 1 tablet (200 mg total) by mouth 2 (two) times daily.    Dispense:  180 tablet    Refill:  1   celecoxib  (CELEBREX ) 100 MG capsule    Sig: TAKE 1 CAPSULE(100 MG) BY MOUTH DAILY    Dispense:  90 capsule    Refill:  1   gabapentin  (NEURONTIN ) 300 MG capsule    Sig: Take 1 capsule (300 mg total) by mouth 3 (three) times daily.    Dispense:  270 capsule    Refill:  0   linaclotide  (LINZESS ) 145 MCG CAPS capsule    Sig: Take 1 capsule (145 mcg total) by mouth daily before  breakfast.    Dispense:  30 capsule    Refill:  4    Fill new script today    Return for previously scheduled, AWV, Kemiyah Tarazon PCP in july .   Total time spent:30 Minutes Time spent includes review of chart, medications, test results, and follow up plan with the patient.   [place] Controlled Substance Database was reviewed by me.  This patient was seen by Laurence Pons, FNP-C in collaboration with Dr. Verneta Gone as a part of collaborative care agreement.   Favor Kreh R. Bobbi Burow, MSN, FNP-C Internal medicine

## 2024-03-24 ENCOUNTER — Ambulatory Visit
Admission: RE | Admit: 2024-03-24 | Discharge: 2024-03-24 | Disposition: A | Discharge status: Home / Self Care | Source: Ambulatory Visit | Attending: Nurse Practitioner

## 2024-03-24 DIAGNOSIS — Z1231 Encounter for screening mammogram for malignant neoplasm of breast: Secondary | ICD-10-CM | POA: Insufficient documentation

## 2024-03-29 DIAGNOSIS — E118 Type 2 diabetes mellitus with unspecified complications: Secondary | ICD-10-CM | POA: Diagnosis not present

## 2024-04-03 DIAGNOSIS — M25562 Pain in left knee: Secondary | ICD-10-CM | POA: Diagnosis not present

## 2024-04-03 DIAGNOSIS — M1712 Unilateral primary osteoarthritis, left knee: Secondary | ICD-10-CM | POA: Diagnosis not present

## 2024-04-03 DIAGNOSIS — M25551 Pain in right hip: Secondary | ICD-10-CM | POA: Diagnosis not present

## 2024-04-04 ENCOUNTER — Ambulatory Visit
Admission: RE | Admit: 2024-04-04 | Discharge: 2024-04-04 | Disposition: A | Discharge status: Home / Self Care | Source: Ambulatory Visit | Attending: Nurse Practitioner | Admitting: Nurse Practitioner

## 2024-04-04 DIAGNOSIS — D869 Sarcoidosis, unspecified: Secondary | ICD-10-CM

## 2024-04-04 DIAGNOSIS — R229 Localized swelling, mass and lump, unspecified: Secondary | ICD-10-CM

## 2024-04-04 DIAGNOSIS — R2231 Localized swelling, mass and lump, right upper limb: Secondary | ICD-10-CM | POA: Diagnosis not present

## 2024-04-06 DIAGNOSIS — M25562 Pain in left knee: Secondary | ICD-10-CM | POA: Diagnosis not present

## 2024-04-06 DIAGNOSIS — M1712 Unilateral primary osteoarthritis, left knee: Secondary | ICD-10-CM | POA: Diagnosis not present

## 2024-04-06 DIAGNOSIS — M25551 Pain in right hip: Secondary | ICD-10-CM | POA: Diagnosis not present

## 2024-04-11 DIAGNOSIS — M1712 Unilateral primary osteoarthritis, left knee: Secondary | ICD-10-CM | POA: Diagnosis not present

## 2024-04-11 DIAGNOSIS — M25551 Pain in right hip: Secondary | ICD-10-CM | POA: Diagnosis not present

## 2024-04-11 DIAGNOSIS — M25562 Pain in left knee: Secondary | ICD-10-CM | POA: Diagnosis not present

## 2024-04-13 DIAGNOSIS — M25562 Pain in left knee: Secondary | ICD-10-CM | POA: Diagnosis not present

## 2024-04-13 DIAGNOSIS — M25551 Pain in right hip: Secondary | ICD-10-CM | POA: Diagnosis not present

## 2024-04-13 DIAGNOSIS — M1712 Unilateral primary osteoarthritis, left knee: Secondary | ICD-10-CM | POA: Diagnosis not present

## 2024-04-17 DIAGNOSIS — M25551 Pain in right hip: Secondary | ICD-10-CM | POA: Diagnosis not present

## 2024-04-17 DIAGNOSIS — M25562 Pain in left knee: Secondary | ICD-10-CM | POA: Diagnosis not present

## 2024-04-17 DIAGNOSIS — M1712 Unilateral primary osteoarthritis, left knee: Secondary | ICD-10-CM | POA: Diagnosis not present

## 2024-04-20 DIAGNOSIS — M25551 Pain in right hip: Secondary | ICD-10-CM | POA: Diagnosis not present

## 2024-04-20 DIAGNOSIS — M1712 Unilateral primary osteoarthritis, left knee: Secondary | ICD-10-CM | POA: Diagnosis not present

## 2024-04-20 DIAGNOSIS — M25562 Pain in left knee: Secondary | ICD-10-CM | POA: Diagnosis not present

## 2024-04-21 DIAGNOSIS — K1379 Other lesions of oral mucosa: Secondary | ICD-10-CM | POA: Diagnosis not present

## 2024-04-24 DIAGNOSIS — M25551 Pain in right hip: Secondary | ICD-10-CM | POA: Diagnosis not present

## 2024-04-24 DIAGNOSIS — M1712 Unilateral primary osteoarthritis, left knee: Secondary | ICD-10-CM | POA: Diagnosis not present

## 2024-04-24 DIAGNOSIS — M25562 Pain in left knee: Secondary | ICD-10-CM | POA: Diagnosis not present

## 2024-04-25 DIAGNOSIS — M1712 Unilateral primary osteoarthritis, left knee: Secondary | ICD-10-CM | POA: Diagnosis not present

## 2024-04-25 DIAGNOSIS — E119 Type 2 diabetes mellitus without complications: Secondary | ICD-10-CM | POA: Diagnosis not present

## 2024-04-26 DIAGNOSIS — E118 Type 2 diabetes mellitus with unspecified complications: Secondary | ICD-10-CM | POA: Diagnosis not present

## 2024-04-27 DIAGNOSIS — M1712 Unilateral primary osteoarthritis, left knee: Secondary | ICD-10-CM | POA: Diagnosis not present

## 2024-04-27 DIAGNOSIS — M25551 Pain in right hip: Secondary | ICD-10-CM | POA: Diagnosis not present

## 2024-04-27 DIAGNOSIS — M25562 Pain in left knee: Secondary | ICD-10-CM | POA: Diagnosis not present

## 2024-05-08 ENCOUNTER — Ambulatory Visit (INDEPENDENT_AMBULATORY_CARE_PROVIDER_SITE_OTHER): Admitting: Podiatry

## 2024-05-08 ENCOUNTER — Encounter: Payer: Self-pay | Admitting: Podiatry

## 2024-05-08 ENCOUNTER — Telehealth: Payer: Self-pay

## 2024-05-08 DIAGNOSIS — B351 Tinea unguium: Secondary | ICD-10-CM | POA: Diagnosis not present

## 2024-05-08 DIAGNOSIS — M79676 Pain in unspecified toe(s): Secondary | ICD-10-CM | POA: Diagnosis not present

## 2024-05-08 DIAGNOSIS — L84 Corns and callosities: Secondary | ICD-10-CM

## 2024-05-08 DIAGNOSIS — E1159 Type 2 diabetes mellitus with other circulatory complications: Secondary | ICD-10-CM | POA: Diagnosis not present

## 2024-05-08 DIAGNOSIS — Z79899 Other long term (current) drug therapy: Secondary | ICD-10-CM

## 2024-05-08 NOTE — Progress Notes (Signed)
 05/08/2024 Name: Chelsey [name] MRN: [id number] DOB: Oct 20, 1951  Chief Complaint  Patient presents with   Diabetes    [facility]    Chelsey [name] is a 73 y.o. year old female who presented for a telephone visit.   They were referred to the pharmacist by a quality report for assistance in managing diabetes.    Subjective:  Care Team: Primary Care Provider: Liana Fish, NP ; Next Scheduled Visit: 05/17/2024   Medication Access/Adherence  Current Pharmacy:  [facility] - [place], [place] - [address] [place] [address] Phone: [phone] Fax: [phone]  [id number] [place], [place] - [address] AT [facility] OF [address] [facility] [address] [place] [address] Phone: [phone] Fax: [phone]   Patient reports affordability concerns with their medications: No  She reports that she pays ~$3 for medication. Of note, patient has [facility] [place] and Medicaid, per patient and chart review.   Patient reports access/transportation concerns to their pharmacy: Yes  Patient states that she was going to call [facility] for transportation and she has never used their services before. She is interested in receiving transportation via [facility] services as she does not have a vehicle.   Patient reports adherence concerns with their medications:  No  She feels is Ozempic 2 mg once weekly pen is not lasting as long as long as it should and feels that is is not working. She states that she is taking the medication as prescribed.      Diabetes:  Current medications:  Recent dose changes at [facility] endocrinology appointment with pharmacist on 04/26/2024:   INCREASE Humalog  to inject 20 units under the skin three times daily BEFORE EACH MEAL.    INCREASE Lantus  to inject 55 units under the skin once every morning  Continue Ozempic 2 mg once weekly  Farxiga  10 mg daily  It was noted during that encounter there was  discussion to switch to Mounjaro She reports that when her BG is 350-400+, she takes an extra 10 units of the insulin  in the gray pen. She found the gray pen and clarified it was Humalog . Since the last appointment with [facility] pharmacist, patient has self adjust dose to an extra Humalog  10 units three times and reports this is typically after lunch. Educated patient regarding self-adjusting medication   Current glucose readings (patient report):   FBG  230s Using Dexcom meter; testing several times daily    Patient denies hypoglycemic s/sx including dizziness, shakiness, sweating. Patient reports hyperglycemic symptoms including polyuria, polydipsia, nocturia..  Current meal patterns: eat mostly frozen (weight loss), tuna, string beans, baked/broil chicken, limited fruit due to BG quickly elevates, avoids potatoes/rice   - Drinks zero sodas, diet sodas (rarely), water mostly  Current physical activity: chair exercise (for a few minutes), walking (some)  - She states she endorses knee patient which limits exercise   Hyperlipidemia/ASCVD Risk Reduction  Current lipid lowering medications: atorvastatin  10 mg daily, zetia  10 mg daily   CrCl 35.4 mL/min (adjusted BW), Scr 1.6 (as of 09/24/2023)   Antiplatelet regimen: Aspirin  81 mg daily     Objective:  Lab Results  Component Value Date   HGBA1C 8.9 (A) 01/31/2024    Lab Results  Component Value Date   CREATININE 1.90 (Chelsey) 04/07/2023   BUN 42 (Chelsey) 03/04/2023   NA 139 03/04/2023   K 5.1 03/04/2023   CL 107 (Chelsey)  03/04/2023   CO2 15 (L) 03/04/2023    Lab Results  Component Value Date   CHOL 135 11/20/2019   HDL 32 (L) 11/20/2019   LDLCALC 64 11/20/2019   TRIG 239 (Chelsey) 11/20/2019    Medications Reviewed Today     Reviewed by Cleatus Dorcas SAUNDERS, RPH (Pharmacist) on 05/08/24 at 1420  Med List Status: <None>   Medication Order Taking? Sig Documenting Provider Last Dose Status Informant  aspirin  EC 81 MG tablet  [id number] Yes Take 1 tablet (81 mg total) by mouth daily. Liana Fish, NP  Active   atorvastatin  (LIPITOR) 10 MG tablet [id number] Yes TAKE ONE TABLET BY MOUTH ONCE DAILY FOR cholesterol Abernathy, [name], NP  Active   BAQSIMI TWO PACK 3 MG/DOSE POWD [id number] Yes  [provider]  Active   celecoxib  (CELEBREX ) 100 MG capsule [id number] Yes TAKE 1 CAPSULE(100 MG) BY MOUTH DAILY Abernathy, [name], NP  Active   chlorhexidine  (PERIDEX ) 0.12 % solution [id number] Yes  [provider]  Active   cilostazol  (PLETAL ) 50 MG tablet [id number]  Take 1 tablet (50 mg total) by mouth 2 (two) times daily. Liana Fish, NP  Active   Continuous Glucose Receiver WILMOT G7 [place]) DEVI [id number] Yes See admin instructions. [provider]  Active   Continuous Glucose Sensor (FREESTYLE LIBRE 2 SENSOR) [place] [id number]  Use as directed every 15 days DXE11.65 Liana Fish, NP  Consider Medication Status and Discontinue (Change in therapy)   dapagliflozin  propanediol (FARXIGA ) 10 MG TABS tablet [id number] Yes Take 1 tablet (10 mg total) by mouth daily. Liana Fish, NP  Active   diclofenac Sodium (VOLTAREN) 1 % GEL [id number] Yes Apply 2 g topically 4 (four) times daily. [provider]  Active   ezetimibe  (ZETIA ) 10 MG tablet [id number] Yes Take 1 tablet (10 mg total) by mouth daily. Liana Fish, NP  Active   febuxostat  (ULORIC ) 40 MG tablet [id number] Yes Take 1 tablet (40 mg total) by mouth daily. Liana Fish, NP  Active   FEROSUL 325 (65 Fe) MG tablet [id number] Yes TAKE 1 TABLET BY MOUTH DAILY WITH BREAKFAST Abernathy, [name], NP  Active   fluticasone -salmeterol (ADVAIR) 250-50 MCG/ACT AEPB [id number] Yes INHALE 1 PUFF BY MOUTH INTO LUNGS IN THE MORNING and AT BEDTIME Abernathy, [name], NP  Active   furosemide  (LASIX ) 20 MG tablet [id number] Yes Take 1 tablet (20 mg total) by mouth daily as needed (increased swelling of legs). Liana Fish, NP  Active    gabapentin  (NEURONTIN ) 300 MG capsule [id number] Yes Take 1 capsule (300 mg total) by mouth 3 (three) times daily. Liana Fish, NP  Active   glucose blood test strip [id number] Yes Test sugar 4 x aday for uncontrolled dm on insulin  E11.22 Liana Fish, NP  Active   hydroxychloroquine  (PLAQUENIL ) 200 MG tablet [id number] Yes Take 1 tablet (200 mg total) by mouth 2 (two) times daily. Liana Fish, NP  Active   insulin  lispro (HUMALOG ) 100 UNIT/ML KwikPen [id number] Yes Inject 15 units three times daily before each meal [provider]  Active   Insulin  Pen Needle (COMFORT EZ PEN NEEDLES) 31G X 8 MM MISC [id number] Yes Twice a day. Liana Fish, NP  Active   LANCETS ULTRA THIN MISC [id number] Yes Apply 1 each topically daily.  [provider]  Active Pharmacy Records           Med Note CATHY OVAL DEL   Wed Sep 17, 2021  1:12 PM)    LANTUS  SOLOSTAR 100 UNIT/ML  Solostar Pen [id number] Yes Inject 50 units once daily, at the same time every day [provider]  Active   levothyroxine  (SYNTHROID ) 50 MCG tablet [id number] Yes TAKE ONE TABLET BY MOUTH EVERY MORNING ON AN EMPTY STOMACH Abernathy, [name], NP  Active   lidocaine  (XYLOCAINE ) 2 % solution [id number] Yes Use as directed 15 mLs in the mouth or throat 3 (three) times daily as needed for mouth pain. Liana Fish, NP  Active   linaclotide  (LINZESS ) 145 MCG CAPS capsule [id number] Yes Take 1 capsule (145 mcg total) by mouth daily before breakfast. Liana Fish, NP  Active   Olmesartan -amLODIPine -HCTZ 40-10-25 MG TABS [id number] Yes Take 1 tablet by mouth daily. Liana Fish, NP  Active   omeprazole  (PRILOSEC) 40 MG capsule [id number] Yes Take 1 capsule (40 mg total) by mouth daily. Liana Fish, NP  Active   OXYGEN  [id number] Yes Inhale 3 L into the lungs. Nighttime use [provider]  Active Pharmacy Records  [facility], 2 MG/DOSE, 8 MG/3ML SOPN [id number] Yes Inject 2 mg into the skin  once a week. [provider]  Active   tretinoin (RETIN-A) 0.025 % cream [id number] Yes Apply topically. [provider]  Active               Assessment/Plan:   Diabetes: - Currently uncontrolled yet improved from last hemoglobin A1c. Although patient recently had a dose increase of basal and bolus insulins on 04/26/2024 with clinical pharmacist at [facility], she is still self adjusting bolus insulin . Patient may need another dose adjustment to safely control BG.  - Reviewed long term cardiovascular and renal outcomes of uncontrolled blood sugar - Reviewed goal A1c, goal fasting, and goal 2 hour post prandial glucose - Reviewed dietary modifications including: more whole foods rather than frozen dinner  - Reviewed lifestyle modifications including: 150 min/week exercise as much as possible such as water aerobics such as membership with [employer]  - Recommend to continue current regimen of recent dose increase. However, contacted endocrinology office and left a message, with the patient on the phone, regarding patient self adjust bolus insulin . Educated patient to contact endocrinologist to help manage insulin  as oppose to self adjusting due to risk of causing hypoglycemia. Will defer dose adjustment for endocrinologist.  - Recommend to check glucose as often as you have been  - Will place referral for transportation needs.     Hyperlipidemia/ASCVD Risk Reduction: - Currently controlled, LDL below goal.  - Reviewed long term complications of uncontrolled cholesterol - Recommend to continue current regimen     Follow Up Plan: 06/23/2024 [facility] Endocrinologist (Dr. Osie), 08/15/2024 [facility] Pharmacist; [name] 4-6 weeks   Dorcas Solian, PharmD Clinical Pharmacist Cell: [phone]

## 2024-05-11 ENCOUNTER — Telehealth: Payer: Self-pay | Admitting: *Deleted

## 2024-05-11 NOTE — Progress Notes (Signed)
 Complex Care Management Note Care Guide Note  05/11/2024 Name: Noelani Harbach MRN: [id number] DOB: 09-15-1951  Ronal Mervyn Leventhal is a 73 y.o. year old female who is a primary care patient of Liana Fish, NP . The community resource team was consulted for assistance with Transportation Needs   SDOH screenings and interventions completed:  Yes     SDOH Interventions Today    Flowsheet Row Most Recent Value  SDOH Interventions   Transportation Interventions SCAT (Specialized Community Area Transporation), Payor Benefit  [[employer] care transportation , ACTA]     Care guide performed the following interventions: Patient provided with information about care guide support team and interviewed to confirm resource needs.  Follow Up Plan:  No further follow up planned at this time. The patient has been provided with needed resources.  Encounter Outcome:  Patient Visit Completed  Shenea Giacobbe Greenauer-Moran  [facility] HealthPopulation Health Care Guide  Direct Dial :[phone] Fax:[phone] Website: [email or web address]

## 2024-05-12 ENCOUNTER — Encounter: Payer: Self-pay | Admitting: Podiatry

## 2024-05-12 NOTE — Progress Notes (Signed)
 Subjective:  Patient ID: Chelsey [name], female    DOB: 10-05-1951,  MRN: [id number]  Chelsey [name] presents to clinic today for at risk footcare. Patient has h/o diabetes, neuropathy and PAD and is seen for  and callus(es) of both feet and painful thick toenails that are difficult to trim. Painful toenails interfere with ambulation. Aggravating factors include wearing enclosed shoe gear. Pain is relieved with periodic professional debridement. Painful calluses are aggravated when weightbearing with and without shoegear. Pain is relieved with periodic professional debridement.  Chief Complaint  Patient presents with   RFC    Rm4/ RFC diabetic/bloodsugar 218/A1c 8.9/ Dr. Liana last visit March 2025   New problem(s): None.   PCP is Liana Fish, NP.  Allergies  Allergen Reactions   Oxycodone-Acetaminophen  Other (See Comments)    Hallucinations Hallucinations HALLUCINATIONS Other reaction(s): Other (See Comments) HALLUCINATIONS Other reaction(s): Other (See Comments) Hallucinations Hallucinations HALLUCINATIONS Hallucinations Other reaction(s): Other (See Comments), Other (See Comments) HALLUCINATIONS Hallucinations Hallucinations HALLUCINATIONS Other reaction(s): Other (See Comments) HALLUCINATIONS Other reaction(s): Other (See Comments) Hallucinations Hallucinations HALLUCINATIONS Hallucinations Other reaction(s): Other (See Comments) HALLUCINATIONS Other reaction(s): Other (See Comments) Hallucinations Hallucinations HALLUCINATIONS Hallucinations    Indomethacin Other (See Comments)    BURN HOLE IN STOMACH BURN HOLE IN STOMACH Other reaction(s): Other (See Comments), Other (See Comments) BURN HOLE IN STOMACH BURN HOLE IN STOMACH BURN HOLE IN STOMACH BURN HOLE IN STOMACH BURN HOLE IN STOMACH    Penicillins Rash    Review of Systems: Negative except as noted in the HPI.  Objective: No changes noted in today's physical  examination. There were no vitals filed for this visit. Chelsey [name] is a pleasant 73 y.o. female obese in NAD. AAO x 3.  Vascular Examination: CFT <3 seconds b/l. DP/PT pulses faintly palpable b/l. Skin temperature gradient warm to warm b/l. No pain with calf compression. No ischemia or gangrene. No cyanosis or clubbing noted b/l. No edema noted b/l LE. Evidence of skin changes consistent with long term venous stasis BLE.   Neurological Examination: Protective sensation diminished with 10g monofilament b/l.  Dermatological Examination: No open wounds. No interdigital macerations.  Toenails 1-5 b/l thick, discolored, elongated with subungual debris and pain on dorsal palpation.    Pedal skin thin and atrophic b/l LE. Evidence of partial matrixectomy medial border left hallux. Hyperkeratotic lesion(s) plantar IPJ of left great toe, plantar IPJ of right great toe, and 5th metatarsal head left foot.  No erythema, no edema, no drainage, no fluctuance.  Musculoskeletal Examination: Muscle strength 5/5 to all lower extremity muscle groups bilaterally. No pain, crepitus or joint limitation noted with ROM bilateral LE. Pes planus deformity noted bilateral LE.  Radiographs: None  Last A1c:      Latest Ref Rng & Units 01/31/2024   11:16 AM  Hemoglobin A1C  Hemoglobin-A1c 4.0 - 5.6 % 8.9    Assessment/Plan: 1. Pain due to onychomycosis of toenail   2. Callus   3. Type 2 diabetes mellitus with vascular disease [facility])     Consent given for treatment. Patient examined. All patient's and/or POA's questions/concerns addressed on today's visit. Mycotic toenails 1-5 debrided in length and girth without incident. Callus(es) plantar IPJ of left great toe, plantar IPJ of right great toe, and 5th metatarsal head left lower extremity pared with sharp debridement without incident. Continue foot and shoe inspections daily. Monitor blood glucose per PCP/Endocrinologist's recommendations.Continue soft,  supportive shoe gear daily. Report any pedal injuries to medical professional. Call office if there  are any quesitons/concerns. Patient/POA to call should there be question/concern in the interim.   Return in about 3 months (around 08/08/2024).  [name], DPM      [facility] LOCATION: [address], [place] [address]                   Office [phone]   [facility] LOCATION: [address] [place], [place] [address] Office [phone]

## 2024-05-17 ENCOUNTER — Ambulatory Visit: Payer: 59 | Admitting: Nurse Practitioner

## 2024-05-17 ENCOUNTER — Encounter: Payer: Self-pay | Admitting: Nurse Practitioner

## 2024-05-17 VITALS — BP 139/66 | HR 63 | Temp 98.5°F | Resp 16 | Ht 65.0 in | Wt 214.0 lb

## 2024-05-17 DIAGNOSIS — E785 Hyperlipidemia, unspecified: Secondary | ICD-10-CM

## 2024-05-17 DIAGNOSIS — I739 Peripheral vascular disease, unspecified: Secondary | ICD-10-CM | POA: Diagnosis not present

## 2024-05-17 DIAGNOSIS — E1169 Type 2 diabetes mellitus with other specified complication: Secondary | ICD-10-CM

## 2024-05-17 DIAGNOSIS — Z794 Long term (current) use of insulin: Secondary | ICD-10-CM | POA: Diagnosis not present

## 2024-05-17 DIAGNOSIS — Z0000 Encounter for general adult medical examination without abnormal findings: Secondary | ICD-10-CM | POA: Diagnosis not present

## 2024-05-17 DIAGNOSIS — I152 Hypertension secondary to endocrine disorders: Secondary | ICD-10-CM

## 2024-05-17 DIAGNOSIS — E1159 Type 2 diabetes mellitus with other circulatory complications: Secondary | ICD-10-CM

## 2024-05-17 DIAGNOSIS — Z79899 Other long term (current) drug therapy: Secondary | ICD-10-CM

## 2024-05-17 DIAGNOSIS — G63 Polyneuropathy in diseases classified elsewhere: Secondary | ICD-10-CM

## 2024-05-17 DIAGNOSIS — D869 Sarcoidosis, unspecified: Secondary | ICD-10-CM

## 2024-05-17 DIAGNOSIS — T82898A Other specified complication of vascular prosthetic devices, implants and grafts, initial encounter: Secondary | ICD-10-CM

## 2024-05-17 LAB — POCT GLYCOSYLATED HEMOGLOBIN (HGB A1C): Hemoglobin A1C: 9.1 % — AB (ref 4.0–5.6)

## 2024-05-17 MED ORDER — FERROUS SULFATE 325 (65 FE) MG PO TABS: 325.0000 mg | ORAL_TABLET | Freq: Every day | ORAL | 1 refills | Status: DC

## 2024-05-17 MED ORDER — OLMESARTAN-AMLODIPINE-HCTZ 40-10-25 MG PO TABS: 1.0000 | ORAL_TABLET | Freq: Every day | ORAL | 1 refills | Status: DC

## 2024-05-17 MED ORDER — DAPAGLIFLOZIN PROPANEDIOL 10 MG PO TABS: 10.0000 mg | ORAL_TABLET | Freq: Every day | ORAL | 1 refills | Status: DC

## 2024-05-17 MED ORDER — HYDROXYCHLOROQUINE SULFATE 200 MG PO TABS: 200.0000 mg | ORAL_TABLET | Freq: Two times a day (BID) | ORAL | 1 refills | Status: DC

## 2024-05-17 MED ORDER — GABAPENTIN 300 MG PO CAPS: 300.0000 mg | ORAL_CAPSULE | Freq: Three times a day (TID) | ORAL | 1 refills | Status: DC

## 2024-05-17 MED ORDER — CELECOXIB 100 MG PO CAPS: ORAL_CAPSULE | ORAL | 1 refills | Status: DC

## 2024-05-17 MED ORDER — EZETIMIBE 10 MG PO TABS: 10.0000 mg | ORAL_TABLET | Freq: Every day | ORAL | 1 refills | Status: DC

## 2024-05-17 MED ORDER — LINACLOTIDE 145 MCG PO CAPS: 145.0000 ug | ORAL_CAPSULE | Freq: Every day | ORAL | 4 refills | Status: DC

## 2024-05-17 MED ORDER — CILOSTAZOL 50 MG PO TABS: 50.0000 mg | ORAL_TABLET | Freq: Two times a day (BID) | ORAL | 1 refills | Status: DC

## 2024-05-17 NOTE — Progress Notes (Signed)
 [facility] [address] [place], [place] [address]  Internal MEDICINE  Office Visit Note  Patient Name: Chelsey [name]  [id number]  [id number]  Date of Service: 05/17/2024  Chief Complaint  Patient presents with   Diabetes   Gastroesophageal Reflux   Hypertension   Medicare Wellness   Quality Metric Gaps    Eye exam    HPI Chelsey [name] presents for a medicare annual wellness visit.  Well-appearing 73 y.o. female with hypertension, PVD, sarcoidosis of lung, fatty liver, diabetes, hypothyroidism, neuropathy, gout, arthritis, CKD, IDA, high cholesterol, and hyperparathyroidism.  Routine CRC screening: due in 2028 Routine mammogram: done in may this year  DEXA scan: done in 2018 Eye exam: goes regularly to eye doctor, has appt in September   foot exam: done by podiaitrist in march  Labs:  due for routine labs  New or worsening pain: chronic leg pains  Other concerns: none  Seeing endocrinologist and still having difficulty controlling glucose levels. Endocrinology also manages her hypothyroidism and hyperparathyroidism.      05/17/2024    9:15 AM 05/17/2023    8:56 AM 08/27/2021    8:59 AM  MMSE - Mini Mental State Exam  Orientation to time 4 5 5   Orientation to Place 5 5 5   Registration 2 3 3   Attention/ Calculation 5 5 5   Recall 3 3 3   Language- name 2 objects 2 2 2   Language- repeat 1 1 1   Language- follow 3 step command 3 3 3   Language- read & follow direction 1 1 1   Write a sentence 1 1 0  Copy design 1 1 1   Total score 28 30 29     Functional Status Survey: Is the patient deaf or have difficulty hearing?: No Does the patient have difficulty seeing, even when wearing glasses/contacts?: No Does the patient have difficulty concentrating, remembering, or making decisions?: No Does the patient have difficulty walking or climbing stairs?: No Does the patient have difficulty dressing or bathing?: No Does the patient have difficulty doing errands alone such  as visiting a doctor's office or shopping?: No     12/25/2021    8:26 AM 04/14/2022    9:05 AM 10/30/2022    8:46 AM 05/17/2023    8:58 AM 05/17/2024    9:14 AM  Fall Risk  Falls in the past year? 1 0 0 0 0  (RETIRED) Patient Fall Risk Level  Low fall risk      Patient at Risk for Falls Due to  No Fall Risks  No Fall Risks   Fall risk Follow up  Falls evaluation completed   Falls evaluation completed      Data saved with a previous flowsheet row definition       05/17/2024    9:14 AM  Depression screen PHQ 2/9  Decreased Interest 0  Down, Depressed, Hopeless 0  PHQ - 2 Score 0       Current Medication: Outpatient Encounter Medications as of 05/17/2024  Medication Sig   aspirin  EC 81 MG tablet Take 1 tablet (81 mg total) by mouth daily.   atorvastatin  (LIPITOR) 10 MG tablet TAKE ONE TABLET BY MOUTH ONCE DAILY FOR cholesterol   BAQSIMI TWO PACK 3 MG/DOSE POWD    celecoxib  (CELEBREX ) 100 MG capsule TAKE 1 CAPSULE(100 MG) BY MOUTH DAILY   chlorhexidine  (PERIDEX ) 0.12 % solution    cilostazol  (PLETAL ) 50 MG tablet Take 1 tablet (50 mg total) by mouth 2 (two) times daily.   Continuous  Glucose Receiver (DEXCOM G7 RECEIVER) DEVI See admin instructions.   Continuous Glucose Sensor (FREESTYLE LIBRE 2 SENSOR) MISC Use as directed every 15 days DXE11.65   dapagliflozin  propanediol (FARXIGA ) 10 MG TABS tablet Take 1 tablet (10 mg total) by mouth daily.   diclofenac Sodium (VOLTAREN) 1 % GEL Apply 2 g topically 4 (four) times daily.   ezetimibe  (ZETIA ) 10 MG tablet Take 1 tablet (10 mg total) by mouth daily.   febuxostat  (ULORIC ) 40 MG tablet Take 1 tablet (40 mg total) by mouth daily.   ferrous sulfate  (FEROSUL) 325 (65 FE) MG tablet Take 1 tablet (325 mg total) by mouth daily with breakfast.   fluticasone -salmeterol (ADVAIR) 250-50 MCG/ACT AEPB INHALE 1 PUFF BY MOUTH INTO LUNGS IN THE MORNING and AT BEDTIME   furosemide  (LASIX ) 20 MG tablet Take 1 tablet (20 mg total) by mouth daily as needed  (increased swelling of legs).   gabapentin  (NEURONTIN ) 300 MG capsule Take 1 capsule (300 mg total) by mouth 3 (three) times daily.   glucose blood test strip Test sugar 4 x aday for uncontrolled dm on insulin  E11.22   hydroxychloroquine  (PLAQUENIL ) 200 MG tablet Take 1 tablet (200 mg total) by mouth 2 (two) times daily.   insulin  lispro (HUMALOG ) 100 UNIT/ML KwikPen Inject 15 units three times daily before each meal   Insulin  Pen Needle (COMFORT EZ PEN NEEDLES) 31G X 8 MM MISC Twice a day.   LANCETS ULTRA THIN MISC Apply 1 each topically daily.    LANTUS  SOLOSTAR 100 UNIT/ML Solostar Pen Inject 50 units once daily, at the same time every day   levothyroxine  (SYNTHROID ) 50 MCG tablet TAKE ONE TABLET BY MOUTH EVERY MORNING ON AN EMPTY STOMACH   lidocaine  (XYLOCAINE ) 2 % solution Use as directed 15 mLs in the mouth or throat 3 (three) times daily as needed for mouth pain.   linaclotide  (LINZESS ) 145 MCG CAPS capsule Take 1 capsule (145 mcg total) by mouth daily before breakfast.   Olmesartan -amLODIPine -HCTZ 40-10-25 MG TABS Take 1 tablet by mouth daily.   omeprazole  (PRILOSEC) 40 MG capsule Take 1 capsule (40 mg total) by mouth daily.   OXYGEN  Inhale 3 L into the lungs. Nighttime use   OZEMPIC, 2 MG/DOSE, 8 MG/3ML SOPN Inject 2 mg into the skin once a week.   tretinoin (RETIN-A) 0.025 % cream Apply topically.   [DISCONTINUED] celecoxib  (CELEBREX ) 100 MG capsule TAKE 1 CAPSULE(100 MG) BY MOUTH DAILY   [DISCONTINUED] cilostazol  (PLETAL ) 50 MG tablet Take 1 tablet (50 mg total) by mouth 2 (two) times daily.   [DISCONTINUED] dapagliflozin  propanediol (FARXIGA ) 10 MG TABS tablet Take 1 tablet (10 mg total) by mouth daily.   [DISCONTINUED] ezetimibe  (ZETIA ) 10 MG tablet Take 1 tablet (10 mg total) by mouth daily.   [DISCONTINUED] FEROSUL 325 (65 Fe) MG tablet TAKE 1 TABLET BY MOUTH DAILY WITH BREAKFAST   [DISCONTINUED] gabapentin  (NEURONTIN ) 300 MG capsule Take 1 capsule (300 mg total) by mouth 3  (three) times daily.   [DISCONTINUED] hydroxychloroquine  (PLAQUENIL ) 200 MG tablet Take 1 tablet (200 mg total) by mouth 2 (two) times daily.   [DISCONTINUED] linaclotide  (LINZESS ) 145 MCG CAPS capsule Take 1 capsule (145 mcg total) by mouth daily before breakfast.   [DISCONTINUED] Olmesartan -amLODIPine -HCTZ 40-10-25 MG TABS Take 1 tablet by mouth daily.   No facility-administered encounter medications on file as of 05/17/2024.    Surgical History: Past Surgical History:  Procedure Laterality Date   BRAIN SURGERY  1990's   CATARACT EXTRACTION W/PHACO  Left 06/11/2016   Procedure: CATARACT EXTRACTION PHACO AND INTRAOCULAR LENS PLACEMENT (IOC);  Surgeon: Elsie Carmine, MD;  Location: [facility] ORS;  Service: Ophthalmology;  Laterality: Left;  US  1.01 AP% 21.6 CDE 13.23 Fluid pack lot # [id number]   CEREBRAL ANEURYSM REPAIR  1990's   COILS   CORONARY ARTERY BYPASS GRAFT     EYE SURGERY  2000's   bilateral cataract   FRACTURE SURGERY     HERNIA REPAIR  2000   ventral   STENT PLACEMENT VASCULAR ([facility] HX)     VEIN BYPASS SURGERY     VENTRAL HERNIA REPAIR N/A 04/29/2016   Procedure: Laparoscopic HERNIA REPAIR VENTRAL ADULT;  Surgeon: Laneta JULIANNA Luna, MD;  Location: [facility] ORS;  Service: General;  Laterality: N/A;    Medical History: Past Medical History:  Diagnosis Date   Anemia    Aneurysm (HCC)    Arthritis    RHEUMATOID ARTHRITIS   Asthma    Collagen vascular disease (HCC)    COPD (chronic obstructive pulmonary disease) (HCC)    Coronary artery disease    Dementia (HCC)    Diabetes mellitus without complication (HCC)    Edema    FEET/LEGS   GERD (gastroesophageal reflux disease)    Gout    H/O wheezing    History of hiatal hernia    Hypertension    Hypothyroidism    Neuropathy    Oxygen  deficiency    HS   Peripheral vascular disease (HCC)    Sarcoidosis of lung (HCC)    Seizures (HCC)    WITH BRAIN 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    Sleep apnea    OXYGEN  AT NIGHT 3 L Seth Ward     Family History: Family History  Problem Relation Age of Onset   Heart disease Mother    Hypertension Mother    Diabetes Mother    Diabetes Father    Heart disease Father    Hypertension Father    Breast cancer Maternal Aunt     Social History   Socioeconomic History   Marital status: Single    Spouse name: Not on file   Number of children: Not on file   Years of education: Not on file   Highest education level: Not on file  Occupational History   Not on file  Tobacco Use   Smoking status: Former    Current packs/day: 0.00    Average packs/day: 1 pack/day for 30.0 years (30.0 ttl pk-yrs)    Types: Cigarettes    Start date: 01/28/1976    Quit date: 01/27/2006    Years since quitting: 18.3    Passive exposure: Past   Smokeless tobacco: Never   Tobacco comments:    quit   Vaping Use   Vaping status: Never Used  Substance and Sexual Activity   Alcohol use: No    Alcohol/week: 0.0 standard drinks of alcohol   Drug use: No   Sexual activity: Not on file  Other Topics Concern   Not on file  Social History Narrative   Not on file   Social Drivers of Health   Financial Resource Strain: Low Risk  (04/25/2024)   Received from [facility]   Overall Financial Resource Strain (CARDIA)    Difficulty of Paying Living Expenses: Not hard at all  Food Insecurity: No Food Insecurity (04/25/2024)   Received from [facility]   Hunger Vital Sign    Within the past 12 months, you worried that your food would run  out before you got the money to buy more.: Never true    Within the past 12 months, the food you bought just didn't last and you didn't have money to get more.: Never true  Transportation Needs: No Transportation Needs (04/25/2024)   Received from [facility] - Transportation    In the past 12 months, has lack of transportation kept you from medical appointments or from getting medications?: No    Lack of  Transportation (Non-Medical): No  Physical Activity: Not on file  Stress: Not on file  Social Connections: Not on file  Intimate Partner Violence: Not on file      Review of Systems  Constitutional:  Positive for fatigue. Negative for activity change, appetite change, chills, fever and unexpected weight change.  HENT:  Positive for congestion and mouth sores. Negative for ear pain, rhinorrhea, sore throat and trouble swallowing.        Sore tongue  Eyes: Negative.   Respiratory: Negative.  Negative for cough, chest tightness, shortness of breath and wheezing.   Cardiovascular: Negative.  Negative for chest pain and palpitations.  Gastrointestinal: Negative.  Negative for abdominal pain, blood in stool, constipation, diarrhea, nausea and vomiting.  Endocrine: Negative.   Genitourinary: Negative.  Negative for difficulty urinating, dysuria, frequency, hematuria and urgency.  Musculoskeletal:  Positive for arthralgias, back pain and gait problem. Negative for joint swelling, myalgias and neck pain.  Skin: Negative.  Negative for rash and wound.  Allergic/Immunologic: Negative.  Negative for immunocompromised state.  Neurological:  Positive for weakness (lower extremities). Negative for dizziness, seizures, numbness and headaches.  Hematological: Negative.   Psychiatric/Behavioral: Negative.  Negative for behavioral problems, self-injury, sleep disturbance and suicidal ideas. The patient is not nervous/anxious.     Vital Signs: BP 139/66   Pulse 63   Temp 98.5 F (36.9 C)   Resp 16   Ht 5' 5 (1.651 m)   Wt 214 lb (97.1 kg)   SpO2 93%   BMI 35.61 kg/m    Physical Exam Vitals reviewed.  Constitutional:      General: She is awake. She is not in acute distress.    Appearance: Normal appearance. She is well-developed and well-groomed. She is obese. She is not ill-appearing or diaphoretic.  HENT:     Head: Normocephalic and atraumatic.     Right Ear: Tympanic membrane, ear canal  and external ear normal.     Left Ear: Tympanic membrane, ear canal and external ear normal.     Nose: Septal deviation present. No congestion or rhinorrhea.     Right Turbinates: Enlarged.     Left Turbinates: Enlarged.     Mouth/Throat:     Lips: Pink.     Mouth: Mucous membranes are moist. Oral lesions present.     Dentition: Abnormal dentition.     Tongue: Lesions present.     Pharynx: Oropharynx is clear. Uvula midline. No oropharyngeal exudate or posterior oropharyngeal erythema.  Eyes:     General: Lids are normal. Vision grossly intact. Gaze aligned appropriately. No scleral icterus.       Right eye: No discharge.        Left eye: No discharge.     Extraocular Movements: Extraocular movements intact.     Conjunctiva/sclera: Conjunctivae normal.     Pupils: Pupils are equal, round, and reactive to light.     Funduscopic exam:    Right eye: Red reflex present.  Left eye: Red reflex present. Neck:     Thyroid : No thyromegaly.     Vascular: No JVD.     Trachea: Trachea and phonation normal. No tracheal deviation.  Cardiovascular:     Rate and Rhythm: Normal rate and regular rhythm.     Pulses:          Carotid pulses are 3+ on the right side and 3+ on the left side.      Radial pulses are 2+ on the right side and 2+ on the left side.       Dorsalis pedis pulses are 2+ on the right side and 2+ on the left side.       Posterior tibial pulses are 2+ on the right side and 2+ on the left side.     Heart sounds: Normal heart sounds, S1 normal and S2 normal. No murmur heard.    No friction rub. No gallop.  Pulmonary:     Effort: Pulmonary effort is normal. No accessory muscle usage or respiratory distress.     Breath sounds: Normal breath sounds and air entry. No stridor. No wheezing or rales.  Chest:     Chest wall: No tenderness.  Breasts:    Breasts are symmetrical.     Right: Normal. No swelling, bleeding, inverted nipple, mass, nipple discharge, skin change or  tenderness.     Left: Normal. No swelling, bleeding, inverted nipple, mass, nipple discharge, skin change or tenderness.     Comments: Declined clinical breast exam and mammogram. Abdominal:     General: Bowel sounds are normal. There is no distension.     Palpations: Abdomen is soft. There is no shifting dullness, fluid wave, mass or pulsatile mass.     Tenderness: There is no abdominal tenderness. There is no guarding or rebound.  Musculoskeletal:        General: No tenderness or deformity.     Cervical back: Normal range of motion and neck supple.     Right lower leg: Edema present.     Left lower leg: Edema present.     Right foot: Decreased range of motion. Bunion present.     Left foot: Decreased range of motion. Bunion present.  Feet:     Right foot:     Protective Sensation: 6 sites tested.  6 sites sensed.     Skin integrity: Callus and dry skin present. No ulcer, blister, skin breakdown, erythema, warmth or fissure.     Toenail Condition: Right toenails are abnormally thick.     Left foot:     Protective Sensation: 6 sites tested.  6 sites sensed.     Skin integrity: Callus and dry skin present. No ulcer, blister, skin breakdown, erythema, warmth or fissure.     Toenail Condition: Left toenails are abnormally thick.  Lymphadenopathy:     Cervical: No cervical adenopathy.     Upper Body:     Right upper body: No supraclavicular, axillary or pectoral adenopathy.     Left upper body: No supraclavicular, axillary or pectoral adenopathy.  Skin:    General: Skin is warm and dry.     Capillary Refill: Capillary refill takes less than 2 seconds.     Coloration: Skin is not pale.     Findings: No erythema or rash.  Neurological:     Mental Status: She is alert and oriented to person, place, and time.     Cranial Nerves: No cranial nerve deficit.     Motor: No  abnormal muscle tone.     Coordination: Coordination normal.     Gait: Gait normal.     Deep Tendon Reflexes: Reflexes  are normal and symmetric.  Psychiatric:        Mood and Affect: Mood and affect normal.        Behavior: Behavior normal. Behavior is cooperative.        Thought Content: Thought content normal.        Judgment: Judgment normal.        Assessment/Plan: 1. Encounter for subsequent annual wellness visit (AWV) in Medicare patient (Primary) Age-appropriate preventive screenings and vaccinations discussed. Routine labs for health maintenance will be ordered if needed but patient also gets labs done with [facility]. PHM updated.   - celecoxib  (CELEBREX ) 100 MG capsule; TAKE 1 CAPSULE(100 MG) BY MOUTH DAILY  Dispense: 90 capsule; Refill: 1 - ferrous sulfate  (FEROSUL) 325 (65 FE) MG tablet; Take 1 tablet (325 mg total) by mouth daily with breakfast.  Dispense: 90 tablet; Refill: 1 - linaclotide  (LINZESS ) 145 MCG CAPS capsule; Take 1 capsule (145 mcg total) by mouth daily before breakfast.  Dispense: 30 capsule; Refill: 4  2. Type 2 diabetes mellitus with other specified complication, with long-term current use of insulin  (HCC) A1c remains elevated and uncontrolled. Follow up with endocrinology at [facility]. Continue medications as prescribed.  - POCT glycosylated hemoglobin (Hb A1C) - Urine Microalbumin w/creat. ratio - dapagliflozin  propanediol (FARXIGA ) 10 MG TABS tablet; Take 1 tablet (10 mg total) by mouth daily.  Dispense: 90 tablet; Refill: 1  3. Hypertension associated with type 2 diabetes mellitus (HCC) Stable, continue medications as prescribed, refills ordered.  - Olmesartan -amLODIPine -HCTZ 40-10-25 MG TABS; Take 1 tablet by mouth daily.  Dispense: 90 tablet; Refill: 1  4. Hyperlipidemia associated with type 2 diabetes mellitus (HCC) Continue medications as prescribed.  - ezetimibe  (ZETIA ) 10 MG tablet; Take 1 tablet (10 mg total) by mouth daily.  Dispense: 90 tablet; Refill: 1  5. Polyneuropathy associated with underlying disease (HCC) Continue gabapentin  as prescribed.  - gabapentin   (NEURONTIN ) 300 MG capsule; Take 1 capsule (300 mg total) by mouth 3 (three) times daily.  Dispense: 270 capsule; Refill: 1  6. Sarcoidosis, nodular type Continue hydroxychloroquine  as prescribed.  - hydroxychloroquine  (PLAQUENIL ) 200 MG tablet; Take 1 tablet (200 mg total) by mouth 2 (two) times daily.  Dispense: 180 tablet; Refill: 1  7. Peripheral vascular disease (HCC) Continue cilostazol  as prescribed. Follow up with [facility] vascular surgery.  - cilostazol  (PLETAL ) 50 MG tablet; Take 1 tablet (50 mg total) by mouth 2 (two) times daily.  Dispense: 180 tablet; Refill: 1  8. Occlusion of femoroperoneal bypass graft (HCC) Waiting to have surgery via vascular surgery. Sees [facility], needs A1c to be down to have surgery done  - cilostazol  (PLETAL ) 50 MG tablet; Take 1 tablet (50 mg total) by mouth 2 (two) times daily.  Dispense: 180 tablet; Refill: 1     General Counseling: Chelsey [name] verbalizes understanding of the findings of todays visit and agrees with plan of treatment. I have discussed any further diagnostic evaluation that may be needed or ordered today. We also reviewed her medications today. she has been encouraged to call the office with any questions or concerns that should arise related to todays visit.    Orders Placed This Encounter  Procedures   Urine Microalbumin w/creat. ratio   POCT glycosylated hemoglobin (Hb A1C)    Meds ordered this encounter  Medications   celecoxib  (CELEBREX ) 100 MG capsule  Sig: TAKE 1 CAPSULE(100 MG) BY MOUTH DAILY    Dispense:  90 capsule    Refill:  1   cilostazol  (PLETAL ) 50 MG tablet    Sig: Take 1 tablet (50 mg total) by mouth 2 (two) times daily.    Dispense:  180 tablet    Refill:  1   dapagliflozin  propanediol (FARXIGA ) 10 MG TABS tablet    Sig: Take 1 tablet (10 mg total) by mouth daily.    Dispense:  90 tablet    Refill:  1   ezetimibe  (ZETIA ) 10 MG tablet    Sig: Take 1 tablet (10 mg total) by mouth daily.    Dispense:  90 tablet     Refill:  1   ferrous sulfate  (FEROSUL) 325 (65 FE) MG tablet    Sig: Take 1 tablet (325 mg total) by mouth daily with breakfast.    Dispense:  90 tablet    Refill:  1   gabapentin  (NEURONTIN ) 300 MG capsule    Sig: Take 1 capsule (300 mg total) by mouth 3 (three) times daily.    Dispense:  270 capsule    Refill:  1   hydroxychloroquine  (PLAQUENIL ) 200 MG tablet    Sig: Take 1 tablet (200 mg total) by mouth 2 (two) times daily.    Dispense:  180 tablet    Refill:  1   linaclotide  (LINZESS ) 145 MCG CAPS capsule    Sig: Take 1 capsule (145 mcg total) by mouth daily before breakfast.    Dispense:  30 capsule    Refill:  4    Fill new script today   Olmesartan -amLODIPine -HCTZ 40-10-25 MG TABS    Sig: Take 1 tablet by mouth daily.    Dispense:  90 tablet    Refill:  1    Return in about 7 weeks (around 07/04/2024) for F/U, Labs, Chelsey [name] PCP.   Total time spent:30 Minutes Time spent includes review of chart, medications, test results, and follow up plan with the patient.   [place] Controlled Substance Database was reviewed by me.  This patient was seen by Mardy Maxin, FNP-C in collaboration with Dr. Sigrid Bathe as a part of collaborative care agreement.  Emmons Toth R. Maxin, MSN, FNP-C Internal medicine

## 2024-05-18 LAB — MICROALBUMIN / CREATININE URINE RATIO
Creatinine, Urine: 79 mg/dL
Microalb/Creat Ratio: <4 mg/g{creat} (ref 0–29)
Microalbumin, Urine: <3 ug/mL

## 2024-06-05 ENCOUNTER — Other Ambulatory Visit: Payer: Self-pay

## 2024-06-05 NOTE — Progress Notes (Signed)
 06/05/2024 Name: Chelsey [name] MRN: [id number] DOB: May 05, 1951  Chief Complaint  Patient presents with   Diabetes    TNM DM    Chelsey [name] is a 73 y.o. year old female who presented for a telephone visit.   They were referred to the pharmacist by a quality report for assistance in managing diabetes.    Subjective:  Care Team: Primary Care Provider: Liana Fish, NP ; Next Scheduled Visit: 07/04/2024   Medication Access/Adherence  Current Pharmacy:  [facility] - [place], [place] - [address] [place] [address] Phone: [phone] Fax: [phone]  [name] [place], [place] - [address] AT [name] OF [address] [name] [address] [place] [address] Phone: [phone] Fax: [phone]   Patient reports affordability concerns with their medications: No  She reports that she pays ~$3 for medication. Of note, patient has [facility] [place] and Medicaid, per patient and chart review.   Patient reports access/transportation concerns to their pharmacy: Yes  Patient states that she was going to call [name] for transportation and she has never used their services before. She is interested in receiving transportation via [name] services as she does not have a vehicle.   Patient reports adherence concerns with their medications:  No  Denies needing refills on medications yet Ozempic overdue - patient agreeable to get medication refilled today     Diabetes:  Current medications:  Recent dose changes at [facility] endocrinology appointment with pharmacist on 04/26/2024:   INCREASE Humalog  to inject 25 units under the skin three times daily BEFORE EACH MEAL.   Lantus  55 units under the skin once every morning  Continue Ozempic 2 mg once weekly  Farxiga  10 mg daily  It was noted during telephone encounter there was discussion to switch from Ozempic to Mounjaro   Current glucose readings (patient report):    FBG  230s (has not changed since we last spoke)  Prandial blood glucose 300s  Using Dexcom meter; testing several times daily    Patient denies hypoglycemic s/sx including dizziness, shakiness, sweating. Patient reports hyperglycemic symptoms including polyuria, polydipsia, nocturia. She also states she may shake, sweat, or feel dizzy when her blood glucose is elevated.   Current meal patterns: still eating frozen dinners  Breakfast: 1 boiled egg, a strips bacon (2), slice of toast Lunch: Sandwich with chips, [name] sandwich - sandwiches are made with two slices of bread Dinner: baked pork chops, rice, string beans, cubed steak, green peas, and cream potatoes  Snack: chips, peanut butter with crackers, sometimes ice cream  - Drinks zero sodas, diet sodas (rarely), water mostly  Current physical activity: chair exercise (for a few minutes), walking (some)  - She states she endorses knee patient which limits exercise  - She states that she has already been with [name] but plans to enroll in water aerobics as we previously discussed during last encounter.    Hyperlipidemia/ASCVD Risk Reduction  Current lipid lowering medications: atorvastatin  10 mg daily, zetia  10 mg daily   CrCl 35.4 mL/min (adjusted BW), Scr 1.6 (as of 09/24/2023)   Antiplatelet regimen: Aspirin  81 mg daily     Objective:  Lab Results  Component Value Date   HGBA1C 9.1 (A) 05/17/2024    Lab Results  Component Value Date   CREATININE 1.90 (H) 04/07/2023   BUN 42 (H) 03/04/2023   NA 139 03/04/2023   K 5.1 03/04/2023  CL 107 (H) 03/04/2023   CO2 15 (L) 03/04/2023    Lab Results  Component Value Date   CHOL 135 11/20/2019   HDL 32 (L) 11/20/2019   LDLCALC 64 11/20/2019   TRIG 239 (H) 11/20/2019    Medications Reviewed Today   Medications were not reviewed in this encounter       Assessment/Plan:   Diabetes: - Currently uncontrolled yet improved from last hemoglobin A1c. Patient  still reports elevated blood glucose, but likely due to diet. Although patient recently had a dose increase of  bolus insulins on 05/09/2024 with MD at [name], has not reported self adjusting insulin . Patient may need another dose adjustment to safely control BG, but will defer for endocrinology team.  - Reviewed long term cardiovascular and renal outcomes of uncontrolled blood sugar - Reviewed goal A1c, goal fasting, and goal 2 hour post prandial glucose - Reviewed dietary modifications including: more whole foods rather than frozen dinner; low carb wraps or keto bread; serving size of crackers  - Reviewed lifestyle modifications including: 150 min/week exercise as much as possible such as water aerobics such as membership with [name]  - Recommend to check glucose as often as you have been  - Refill with pharmacy: Ozempic.  - Continue current regimen for diabetes      Follow Up Plan: 06/23/2024 [facility] Endocrinologist (Dr. Osie), 08/15/2024 [facility] Pharmacist; [name] 4-8 weeks  after appointment with Dr. Baldemar.   Update: called [facility] to get medication refilled.   Chelsey [name], PharmD Clinical Pharmacist Cell: [phone]

## 2024-06-08 ENCOUNTER — Encounter: Payer: Self-pay | Admitting: Nurse Practitioner

## 2024-06-20 ENCOUNTER — Other Ambulatory Visit: Payer: Self-pay | Admitting: Nurse Practitioner

## 2024-06-20 DIAGNOSIS — E538 Deficiency of other specified B group vitamins: Secondary | ICD-10-CM | POA: Diagnosis not present

## 2024-06-20 DIAGNOSIS — K120 Recurrent oral aphthae: Secondary | ICD-10-CM | POA: Diagnosis not present

## 2024-06-20 DIAGNOSIS — E559 Vitamin D deficiency, unspecified: Secondary | ICD-10-CM | POA: Diagnosis not present

## 2024-06-20 DIAGNOSIS — M064 Inflammatory polyarthropathy: Secondary | ICD-10-CM | POA: Diagnosis not present

## 2024-06-20 DIAGNOSIS — E1165 Type 2 diabetes mellitus with hyperglycemia: Secondary | ICD-10-CM | POA: Diagnosis not present

## 2024-06-20 DIAGNOSIS — E039 Hypothyroidism, unspecified: Secondary | ICD-10-CM | POA: Diagnosis not present

## 2024-06-21 ENCOUNTER — Ambulatory Visit: Admitting: Nurse Practitioner

## 2024-06-21 LAB — COMPREHENSIVE METABOLIC PANEL WITH GFR
ALT: 18 IU/L (ref 0–32)
AST: 20 IU/L (ref 0–40)
Albumin: 4.2 g/dL (ref 3.8–4.8)
Alkaline Phosphatase: 159 IU/L — ABNORMAL HIGH (ref 44–121)
BUN/Creatinine Ratio: 19 (ref 12–28)
BUN: 33 mg/dL — ABNORMAL HIGH (ref 8–27)
Bilirubin Total: 0.5 mg/dL (ref 0.0–1.2)
CO2: 19 mmol/L — ABNORMAL LOW (ref 20–29)
Calcium: 9.7 mg/dL (ref 8.7–10.3)
Chloride: 100 mmol/L (ref 96–106)
Creatinine, Ser: 1.76 mg/dL — ABNORMAL HIGH (ref 0.57–1.00)
Globulin, Total: 3 g/dL (ref 1.5–4.5)
Glucose: 335 mg/dL — ABNORMAL HIGH (ref 70–99)
Potassium: 5 mmol/L (ref 3.5–5.2)
Sodium: 137 mmol/L (ref 134–144)
Total Protein: 7.2 g/dL (ref 6.0–8.5)
eGFR: 30 mL/min/1.73 — ABNORMAL LOW (ref >59)

## 2024-06-21 LAB — B12 AND FOLATE PANEL
Folate: 8 ng/mL (ref >3.0)
Vitamin B-12: 1037 pg/mL (ref 232–1245)

## 2024-06-21 LAB — T4, FREE: Free T4: 1.05 ng/dL (ref 0.82–1.77)

## 2024-06-21 LAB — CBC WITH DIFFERENTIAL/PLATELET
Basophils Absolute: 0.1 x10E3/uL (ref 0.0–0.2)
Basos: 1 %
EOS (ABSOLUTE): 0.2 x10E3/uL (ref 0.0–0.4)
Eos: 5 %
Hematocrit: 48.4 % — ABNORMAL HIGH (ref 34.0–46.6)
Hemoglobin: 15.8 g/dL (ref 11.1–15.9)
Immature Grans (Abs): 0 x10E3/uL (ref 0.0–0.1)
Immature Granulocytes: 0 %
Lymphocytes Absolute: 0.8 x10E3/uL (ref 0.7–3.1)
Lymphs: 19 %
MCH: 29.7 pg (ref 26.6–33.0)
MCHC: 32.6 g/dL (ref 31.5–35.7)
MCV: 91 fL (ref 79–97)
Monocytes Absolute: 0.4 x10E3/uL (ref 0.1–0.9)
Monocytes: 8 %
Neutrophils Absolute: 2.9 x10E3/uL (ref 1.4–7.0)
Neutrophils: 67 %
Platelets: 199 x10E3/uL (ref 150–450)
RBC: 5.32 x10E6/uL — ABNORMAL HIGH (ref 3.77–5.28)
RDW: 12 % (ref 11.7–15.4)
WBC: 4.3 x10E3/uL (ref 3.4–10.8)

## 2024-06-21 LAB — TSH: TSH: 4.84 u[IU]/mL — ABNORMAL HIGH (ref 0.450–4.500)

## 2024-06-21 LAB — LIPID PANEL
Chol/HDL Ratio: 4.5 ratio — ABNORMAL HIGH (ref 0.0–4.4)
Cholesterol, Total: 179 mg/dL (ref 100–199)
HDL: 40 mg/dL (ref >39)
LDL Chol Calc (NIH): 102 mg/dL — ABNORMAL HIGH (ref 0–99)
Triglycerides: 213 mg/dL — ABNORMAL HIGH (ref 0–149)
VLDL Cholesterol Cal: 37 mg/dL (ref 5–40)

## 2024-06-21 LAB — ANA: Anti Nuclear Antibody (ANA): POSITIVE — AB

## 2024-06-21 LAB — C-REACTIVE PROTEIN: CRP: 2 mg/L (ref 0–10)

## 2024-06-21 LAB — RHEUMATOID FACTOR: Rheumatoid fact SerPl-aCnc: 14.3 [IU]/mL — ABNORMAL HIGH (ref <14.0)

## 2024-06-21 LAB — SEDIMENTATION RATE: Sed Rate: 5 mm/h (ref 0–40)

## 2024-06-21 LAB — VITAMIN D 25 HYDROXY (VIT D DEFICIENCY, FRACTURES): Vit D, 25-Hydroxy: 13.7 ng/mL — ABNORMAL LOW (ref 30.0–100.0)

## 2024-07-04 ENCOUNTER — Ambulatory Visit (INDEPENDENT_AMBULATORY_CARE_PROVIDER_SITE_OTHER): Admitting: Nurse Practitioner

## 2024-07-04 ENCOUNTER — Encounter: Payer: Self-pay | Admitting: Nurse Practitioner

## 2024-07-04 VITALS — BP 134/69 | HR 80 | Temp 97.4°F | Resp 16 | Ht 65.0 in | Wt 216.6 lb

## 2024-07-04 DIAGNOSIS — M0580 Other rheumatoid arthritis with rheumatoid factor of unspecified site: Secondary | ICD-10-CM | POA: Insufficient documentation

## 2024-07-04 DIAGNOSIS — I152 Hypertension secondary to endocrine disorders: Secondary | ICD-10-CM

## 2024-07-04 DIAGNOSIS — E1159 Type 2 diabetes mellitus with other circulatory complications: Secondary | ICD-10-CM | POA: Diagnosis not present

## 2024-07-04 DIAGNOSIS — R229 Localized swelling, mass and lump, unspecified: Secondary | ICD-10-CM | POA: Diagnosis not present

## 2024-07-04 DIAGNOSIS — E559 Vitamin D deficiency, unspecified: Secondary | ICD-10-CM

## 2024-07-04 DIAGNOSIS — D869 Sarcoidosis, unspecified: Secondary | ICD-10-CM

## 2024-07-04 DIAGNOSIS — N1832 Chronic kidney disease, stage 3b: Secondary | ICD-10-CM | POA: Diagnosis not present

## 2024-07-04 DIAGNOSIS — R768 Other specified abnormal immunological findings in serum: Secondary | ICD-10-CM | POA: Insufficient documentation

## 2024-07-04 DIAGNOSIS — K1379 Other lesions of oral mucosa: Secondary | ICD-10-CM

## 2024-07-04 DIAGNOSIS — D860 Sarcoidosis of lung: Secondary | ICD-10-CM

## 2024-07-04 MED ORDER — VITAMIN D (ERGOCALCIFEROL) 1.25 MG (50000 UNIT) PO CAPS
50000.0000 [IU] | ORAL_CAPSULE | ORAL | 1 refills | Status: DC
Start: 2024-07-04 — End: 2024-10-10

## 2024-07-04 NOTE — Progress Notes (Signed)
 [facility] [address] [place], [place] [address]  Internal MEDICINE  Office Visit Note  Patient Name: Chelsey [name]  [id number]  [id number]  Date of Service: 07/04/2024  Chief Complaint  Patient presents with   Diabetes   Gastroesophageal Reflux   Hypertension   Follow-up    HPI Kanyah presents for a follow-up visit for recent lab results. 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 Not currently seeing rheumatology Low vitamin D  level 13.7 -- previously on weekly vitamin D  supplement.  Hyperlipidemia -- high LDL, elevated triglycerides, currently on atorvastatin  10 mg daily. Elevated creatinine and decreased eGFR at 30. CKD stage stage 3b per current and previous labs. Not currently seeing nephrology.  Hypothyroidism -- TSH is improved but still slightly elevated. Currently taking 50 mcg of levothyroxine  daily.    Current Medication: Outpatient Encounter Medications as of 07/04/2024  Medication Sig   Vitamin D , Ergocalciferol , (DRISDOL ) 1.25 MG (50000 UNIT) CAPS capsule Take 1 capsule (50,000 Units total) by mouth every 7 (seven) days.   aspirin  EC 81 MG tablet Take 1 tablet (81 mg total) by mouth daily.   atorvastatin  (LIPITOR) 10 MG tablet TAKE ONE TABLET BY MOUTH ONCE DAILY FOR cholesterol   BAQSIMI TWO PACK 3 MG/DOSE POWD    celecoxib  (CELEBREX ) 100 MG capsule TAKE 1 CAPSULE(100 MG) BY MOUTH DAILY   chlorhexidine  (PERIDEX ) 0.12 % solution    cilostazol  (PLETAL ) 50 MG tablet Take 1 tablet (50 mg total) by mouth 2 (two) times daily.   Continuous Glucose Receiver (DEXCOM G7 RECEIVER) DEVI See admin instructions.   Continuous Glucose Sensor (FREESTYLE LIBRE 2 SENSOR) MISC Use as directed every 15 days DXE11.65   dapagliflozin  propanediol (FARXIGA ) 10 MG TABS tablet Take 1 tablet (10 mg total) by mouth daily.   diclofenac Sodium (VOLTAREN) 1 % GEL Apply 2 g topically 4 (four) times daily.    ezetimibe  (ZETIA ) 10 MG tablet Take 1 tablet (10 mg total) by mouth daily.   febuxostat  (ULORIC ) 40 MG tablet Take 1 tablet (40 mg total) by mouth daily.   ferrous sulfate  (FEROSUL) 325 (65 FE) MG tablet Take 1 tablet (325 mg total) by mouth daily with breakfast.   fluticasone -salmeterol (ADVAIR) 250-50 MCG/ACT AEPB INHALE 1 PUFF BY MOUTH INTO LUNGS IN THE MORNING and AT BEDTIME   furosemide  (LASIX ) 20 MG tablet Take 1 tablet (20 mg total) by mouth daily as needed (increased swelling of legs).   gabapentin  (NEURONTIN ) 300 MG capsule Take 1 capsule (300 mg total) by mouth 3 (three) times daily.   glucose blood test strip Test sugar 4 x aday for uncontrolled dm on insulin  E11.22   hydroxychloroquine  (PLAQUENIL ) 200 MG tablet Take 1 tablet (200 mg total) by mouth 2 (two) times daily.   insulin  lispro (HUMALOG ) 100 UNIT/ML KwikPen Inject 15 units three times daily before each meal   Insulin  Pen Needle (COMFORT EZ PEN NEEDLES) 31G X 8 MM MISC Twice a day.   LANCETS ULTRA THIN MISC Apply 1 each topically daily.    LANTUS  SOLOSTAR 100 UNIT/ML Solostar Pen Inject 50 units once daily, at the same time every day   levothyroxine  (SYNTHROID ) 50 MCG tablet TAKE ONE TABLET BY MOUTH EVERY MORNING ON AN EMPTY STOMACH   lidocaine  (XYLOCAINE ) 2 % solution Use as directed 15 mLs in the mouth or throat 3 (three) times daily as needed for mouth pain.   linaclotide  (LINZESS ) 145 MCG CAPS capsule  Take 1 capsule (145 mcg total) by mouth daily before breakfast.   Olmesartan -amLODIPine -HCTZ 40-10-25 MG TABS Take 1 tablet by mouth daily.   omeprazole  (PRILOSEC) 40 MG capsule Take 1 capsule (40 mg total) by mouth daily.   OXYGEN  Inhale 3 L into the lungs. Nighttime use   OZEMPIC, 2 MG/DOSE, 8 MG/3ML SOPN Inject 2 mg into the skin once a week.   tretinoin (RETIN-A) 0.025 % cream Apply topically.   No facility-administered encounter medications on file as of 07/04/2024.    Surgical History: Past Surgical History:   Procedure Laterality Date   BRAIN SURGERY  1990's   CATARACT EXTRACTION W/PHACO Left 06/11/2016   Procedure: CATARACT EXTRACTION PHACO AND INTRAOCULAR LENS PLACEMENT (IOC);  Surgeon: Elsie Carmine, MD;  Location: [facility] ORS;  Service: Ophthalmology;  Laterality: Left;  US  1.01 AP% 21.6 CDE 13.23 Fluid pack lot # [id number]   CEREBRAL ANEURYSM REPAIR  1990's   COILS   CORONARY ARTERY BYPASS GRAFT     EYE SURGERY  2000's   bilateral cataract   FRACTURE SURGERY     HERNIA REPAIR  2000   ventral   STENT PLACEMENT VASCULAR ([facility] HX)     VEIN BYPASS SURGERY     VENTRAL HERNIA REPAIR N/A 04/29/2016   Procedure: Laparoscopic HERNIA REPAIR VENTRAL ADULT;  Surgeon: Laneta JULIANNA Luna, MD;  Location: [facility] ORS;  Service: General;  Laterality: N/A;    Medical History: Past Medical History:  Diagnosis Date   Anemia    Aneurysm (HCC)    Arthritis    RHEUMATOID ARTHRITIS   Asthma    Collagen vascular disease (HCC)    COPD (chronic obstructive pulmonary disease) (HCC)    Coronary artery disease    Dementia (HCC)    Diabetes mellitus without complication (HCC)    Edema    FEET/LEGS   GERD (gastroesophageal reflux disease)    Gout    H/O wheezing    History of hiatal hernia    Hypertension    Hypothyroidism    Neuropathy    Oxygen  deficiency    HS   Peripheral vascular disease (HCC)    Sarcoidosis of lung (HCC)    Seizures (HCC)    WITH BRAIN 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    Sleep apnea    OXYGEN  AT NIGHT 3 L [place]    Family History: Family History  Problem Relation Age of Onset   Heart disease Mother    Hypertension Mother    Diabetes Mother    Diabetes Father    Heart disease Father    Hypertension Father    Breast cancer Maternal Aunt     Social History   Socioeconomic History   Marital status: Single    Spouse name: Not on file   Number of children: Not on file   Years of education: Not on file   Highest education level: Not on file  Occupational History   Not on file   Tobacco Use   Smoking status: Former    Current packs/day: 0.00    Average packs/day: 1 pack/day for 30.0 years (30.0 ttl pk-yrs)    Types: Cigarettes    Start date: 01/28/1976    Quit date: 01/27/2006    Years since quitting: 18.4    Passive exposure: Past   Smokeless tobacco: Never   Tobacco comments:    quit   Vaping Use   Vaping status: Never Used  Substance and Sexual Activity   Alcohol use: No    Alcohol/week: 0.0  standard drinks of alcohol   Drug use: No   Sexual activity: Not on file  Other Topics Concern   Not on file  Social History Narrative   Not on file   Social Drivers of Health   Financial Resource Strain: Low Risk  (04/25/2024)   Received from [facility]   Overall Financial Resource Strain (CARDIA)    Difficulty of Paying Living Expenses: Not hard at all  Food Insecurity: No Food Insecurity (04/25/2024)   Received from [facility]   Hunger Vital Sign    Within the past 12 months, you worried that your food would run out before you got the money to buy more.: Never true    Within the past 12 months, the food you bought just didn't last and you didn't have money to get more.: Never true  Transportation Needs: No Transportation Needs (04/25/2024)   Received from [facility] - Transportation    In the past 12 months, has lack of transportation kept you from medical appointments or from getting medications?: No    Lack of Transportation (Non-Medical): No  Physical Activity: Not on file  Stress: Not on file  Social Connections: Not on file  Intimate Partner Violence: Not on file      Review of Systems  Constitutional:  Positive for fatigue. Negative for activity change, appetite change, chills, fever and unexpected weight change.  HENT:  Positive for congestion and mouth sores. Negative for ear pain, rhinorrhea, sore throat and trouble swallowing.        Sore tongue  Eyes: Negative.    Respiratory: Negative.  Negative for cough, chest tightness, shortness of breath and wheezing.   Cardiovascular: Negative.  Negative for chest pain and palpitations.  Gastrointestinal: Negative.  Negative for abdominal pain, blood in stool, constipation, diarrhea, nausea and vomiting.  Endocrine: Negative.   Genitourinary: Negative.  Negative for difficulty urinating, dysuria, frequency, hematuria and urgency.  Musculoskeletal:  Positive for arthralgias, back pain and gait problem. Negative for joint swelling, myalgias and neck pain.  Skin: Negative.  Negative for rash and wound.  Allergic/Immunologic: Negative.  Negative for immunocompromised state.  Neurological:  Positive for weakness (lower extremities). Negative for dizziness, seizures, numbness and headaches.  Hematological: Negative.   Psychiatric/Behavioral: Negative.  Negative for behavioral problems, self-injury, sleep disturbance and suicidal ideas. The patient is not nervous/anxious.     Vital Signs: BP 134/69   Pulse 80   Temp (!) 97.4 F (36.3 C)   Resp 16   Ht 5' 5 (1.651 m)   Wt 216 lb 9.6 oz (98.2 kg)   SpO2 94%   BMI 36.04 kg/m    Physical Exam Vitals reviewed.  Constitutional:      General: She is awake. She is not in acute distress.    Appearance: Normal appearance. She is well-developed and well-groomed. She is obese. She is not ill-appearing or diaphoretic.  HENT:     Head: Normocephalic and atraumatic.     Right Ear: Tympanic membrane, ear canal and external ear normal.     Left Ear: Tympanic membrane, ear canal and external ear normal.     Nose: Septal deviation present. No congestion or rhinorrhea.     Right Turbinates: Enlarged.     Left Turbinates: Enlarged.     Mouth/Throat:     Lips: Pink.     Mouth: Mucous membranes are moist. Oral lesions present.     Dentition: Abnormal dentition.  Tongue: Lesions present.     Pharynx: Oropharynx is clear. Uvula midline. No oropharyngeal exudate or  posterior oropharyngeal erythema.  Eyes:     General: Lids are normal. Vision grossly intact. Gaze aligned appropriately. No scleral icterus.       Right eye: No discharge.        Left eye: No discharge.     Extraocular Movements: Extraocular movements intact.     Conjunctiva/sclera: Conjunctivae normal.     Pupils: Pupils are equal, round, and reactive to light.     Funduscopic exam:    Right eye: Red reflex present.        Left eye: Red reflex present. Neck:     Thyroid : No thyromegaly.     Vascular: No JVD.     Trachea: Trachea and phonation normal. No tracheal deviation.  Cardiovascular:     Rate and Rhythm: Normal rate and regular rhythm.     Pulses:          Carotid pulses are 3+ on the right side and 3+ on the left side.      Radial pulses are 2+ on the right side and 2+ on the left side.       Dorsalis pedis pulses are 2+ on the right side and 2+ on the left side.       Posterior tibial pulses are 2+ on the right side and 2+ on the left side.     Heart sounds: Normal heart sounds, S1 normal and S2 normal. No murmur heard.    No friction rub. No gallop.  Pulmonary:     Effort: Pulmonary effort is normal. No accessory muscle usage or respiratory distress.     Breath sounds: Normal breath sounds and air entry. No stridor. No wheezing or rales.  Chest:     Chest wall: No tenderness.  Breasts:    Breasts are symmetrical.     Right: Normal. No swelling, bleeding, inverted nipple, mass, nipple discharge, skin change or tenderness.     Left: Normal. No swelling, bleeding, inverted nipple, mass, nipple discharge, skin change or tenderness.     Comments: Declined clinical breast exam and mammogram. Abdominal:     General: Bowel sounds are normal. There is no distension.     Palpations: Abdomen is soft. There is no shifting dullness, fluid wave, mass or pulsatile mass.     Tenderness: There is no abdominal tenderness. There is no guarding or rebound.  Musculoskeletal:         General: No tenderness or deformity.     Cervical back: Normal range of motion and neck supple.     Right lower leg: Edema present.     Left lower leg: Edema present.     Right foot: Decreased range of motion. Bunion present.     Left foot: Decreased range of motion. Bunion present.  Feet:     Right foot:     Protective Sensation: 6 sites tested.  6 sites sensed.     Skin integrity: Callus and dry skin present. No ulcer, blister, skin breakdown, erythema, warmth or fissure.     Toenail Condition: Right toenails are abnormally thick.     Left foot:     Protective Sensation: 6 sites tested.  6 sites sensed.     Skin integrity: Callus and dry skin present. No ulcer, blister, skin breakdown, erythema, warmth or fissure.     Toenail Condition: Left toenails are abnormally thick.  Lymphadenopathy:     Cervical:  No cervical adenopathy.     Upper Body:     Right upper body: No supraclavicular, axillary or pectoral adenopathy.     Left upper body: No supraclavicular, axillary or pectoral adenopathy.  Skin:    General: Skin is warm and dry.     Capillary Refill: Capillary refill takes less than 2 seconds.     Coloration: Skin is not pale.     Findings: No erythema or rash.  Neurological:     Mental Status: She is alert and oriented to person, place, and time.     Cranial Nerves: No cranial nerve deficit.     Motor: No abnormal muscle tone.     Coordination: Coordination normal.     Gait: Gait normal.     Deep Tendon Reflexes: Reflexes are normal and symmetric.  Psychiatric:        Mood and Affect: Mood and affect normal.        Behavior: Behavior normal. Behavior is cooperative.        Thought Content: Thought content normal.        Judgment: Judgment normal.        Assessment/Plan: 1. 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 Positive ANA (antinuclear antibody) Referred to  rheumatology urgently  - Ambulatory referral to Rheumatology  3. Sarcoidosis, nodular type Referred to rheumatology urgently  - Ambulatory referral to Rheumatology  4. Sarcoidosis of lung (HCC) Referred to rheumatology urgently  - Ambulatory referral to Rheumatology  5. Subcutaneous nodules, generalized Referred to rheumatology urgently  - Ambulatory referral to Rheumatology  6. Mouth sores Referred to rheumatology urgently  - Ambulatory referral to Rheumatology  7. Stage 3b chronic kidney disease (HCC) Continue to monitor, repeat labs in 3 months   8. Hypertension associated with type 2 diabetes mellitus (HCC) Stable, continue medications as prescribed.   9. Vitamin D  deficiency Restart weekly vitamin D  supplement as prescribed.  - Vitamin D , Ergocalciferol , (DRISDOL ) 1.25 MG (50000 UNIT) CAPS capsule; Take 1 capsule (50,000 Units total) by mouth every 7 (seven) days.  Dispense: 12 capsule; Refill: 1   General Counseling: Venisa verbalizes understanding of the findings of todays visit and agrees with plan of treatment. I have discussed any further diagnostic evaluation that may be needed or ordered today. We also reviewed her medications today. she has been encouraged to call the office with any questions or concerns that should arise related to todays visit.    Orders Placed This Encounter  Procedures   Ambulatory referral to Rheumatology    Meds ordered this encounter  Medications   Vitamin D , Ergocalciferol , (DRISDOL ) 1.25 MG (50000 UNIT) CAPS capsule    Sig: Take 1 capsule (50,000 Units total) by mouth every 7 (seven) days.    Dispense:  12 capsule    Refill:  1    Return in about 3 months (around 10/04/2024) for F/U, Maressa Apollo PCP.   Total time spent:30 Minutes Time spent includes review of chart, medications, test results, and follow up plan with the patient.   [place] Controlled Substance Database was reviewed by me.  This patient was seen by Mardy Maxin,  FNP-C in collaboration with Dr. Sigrid Bathe as a part of collaborative care agreement.   Camesha Farooq R. Maxin, MSN, FNP-C Internal medicine

## 2024-07-05 ENCOUNTER — Telehealth: Payer: Self-pay | Admitting: Nurse Practitioner

## 2024-07-05 ENCOUNTER — Encounter: Payer: Self-pay | Admitting: Nurse Practitioner

## 2024-07-05 NOTE — Telephone Encounter (Signed)
 Urgent Rheumatology referral faxed to [facility] per patient request; [phone]. Attempted to contact patient, no answer, unable to leave msg. [facility]sey [name]

## 2024-07-12 DIAGNOSIS — E785 Hyperlipidemia, unspecified: Secondary | ICD-10-CM | POA: Diagnosis not present

## 2024-07-12 DIAGNOSIS — E118 Type 2 diabetes mellitus with unspecified complications: Secondary | ICD-10-CM | POA: Diagnosis not present

## 2024-07-12 DIAGNOSIS — N189 Chronic kidney disease, unspecified: Secondary | ICD-10-CM | POA: Diagnosis not present

## 2024-07-25 ENCOUNTER — Ambulatory Visit: Admitting: Nurse Practitioner

## 2024-07-31 DIAGNOSIS — Z961 Presence of intraocular lens: Secondary | ICD-10-CM | POA: Diagnosis not present

## 2024-07-31 DIAGNOSIS — M069 Rheumatoid arthritis, unspecified: Secondary | ICD-10-CM | POA: Diagnosis not present

## 2024-07-31 DIAGNOSIS — Z79899 Other long term (current) drug therapy: Secondary | ICD-10-CM | POA: Diagnosis not present

## 2024-07-31 DIAGNOSIS — E119 Type 2 diabetes mellitus without complications: Secondary | ICD-10-CM | POA: Diagnosis not present

## 2024-07-31 LAB — HM DIABETES EYE EXAM

## 2024-08-09 DIAGNOSIS — E118 Type 2 diabetes mellitus with unspecified complications: Secondary | ICD-10-CM | POA: Diagnosis not present

## 2024-08-11 DIAGNOSIS — L731 Pseudofolliculitis barbae: Secondary | ICD-10-CM | POA: Diagnosis not present

## 2024-08-17 ENCOUNTER — Ambulatory Visit: Admitting: Podiatry

## 2024-08-17 ENCOUNTER — Encounter: Payer: Self-pay | Admitting: Podiatry

## 2024-08-17 DIAGNOSIS — L84 Corns and callosities: Secondary | ICD-10-CM | POA: Diagnosis not present

## 2024-08-17 DIAGNOSIS — M79676 Pain in unspecified toe(s): Secondary | ICD-10-CM

## 2024-08-17 DIAGNOSIS — B351 Tinea unguium: Secondary | ICD-10-CM | POA: Diagnosis not present

## 2024-08-17 DIAGNOSIS — E1159 Type 2 diabetes mellitus with other circulatory complications: Secondary | ICD-10-CM

## 2024-08-17 NOTE — Progress Notes (Signed)
 Subjective:  Patient ID: Chelsey [name], female    DOB: 08-17-1951,  MRN: [id number]  Chelsey [name] presents to clinic today for at risk footcare. Patient has h/o diabetes, neuropathy and PAD and is seen for  and callus(es) b/l lower extremities and painful mycotic toenails that are difficult to trim. Painful toenails interfere with ambulation. Aggravating factors include wearing enclosed shoe gear. Pain is relieved with periodic professional debridement. Painful calluses are aggravated when weightbearing with and without shoegear. Pain is relieved with periodic professional debridement.  Chief Complaint  Patient presents with   Nail Problem    RFC   New problem(s): None.   PCP is Liana Fish, NP.  Allergies  Allergen Reactions   Oxycodone-Acetaminophen  Other (See Comments)    Hallucinations Hallucinations HALLUCINATIONS Other reaction(s): Other (See Comments) HALLUCINATIONS Other reaction(s): Other (See Comments) Hallucinations Hallucinations HALLUCINATIONS Hallucinations Other reaction(s): Other (See Comments), Other (See Comments) HALLUCINATIONS Hallucinations Hallucinations HALLUCINATIONS Other reaction(s): Other (See Comments) HALLUCINATIONS Other reaction(s): Other (See Comments) Hallucinations Hallucinations HALLUCINATIONS Hallucinations Other reaction(s): Other (See Comments) HALLUCINATIONS Other reaction(s): Other (See Comments) Hallucinations Hallucinations HALLUCINATIONS Hallucinations    Indomethacin Other (See Comments)    BURN HOLE IN STOMACH BURN HOLE IN STOMACH Other reaction(s): Other (See Comments), Other (See Comments) BURN HOLE IN STOMACH BURN HOLE IN STOMACH BURN HOLE IN STOMACH BURN HOLE IN STOMACH BURN HOLE IN STOMACH    Penicillins Rash    Review of Systems: Negative except as noted in the HPI.  Objective:  There were no vitals filed for this visit. Chelsey [name] is a pleasant 73 y.o. female obese in  NAD. AAO x 3.  Vascular Examination: CFT <3 seconds b/l. DP/PT pulses faintly palpable b/l. Skin temperature gradient warm to warm b/l. No pain with calf compression. No ischemia or gangrene. No cyanosis or clubbing noted b/l. Trace edema b/l LE. Evidence of skin changes consistent with long term venous stasis BLE.   Neurological Examination: Protective sensation diminished with 10g monofilament b/l.  Dermatological Examination: No open wounds. No interdigital macerations.  Toenails 1-5 b/l thick, discolored, elongated with subungual debris and pain on dorsal palpation.    Pedal skin thin and atrophic b/l LE. Evidence of partial matrixectomy medial border left hallux. Hyperkeratotic lesion(s) plantar IPJ of left great toe, plantar IPJ of right great toe, and 5th metatarsal head left foot.  No erythema, no edema, no drainage, no fluctuance.  Musculoskeletal Examination: Muscle strength 5/5 to all lower extremity muscle groups bilaterally. No pain, crepitus or joint limitation noted with ROM bilateral LE. Pes planus deformity noted bilateral LE.  Radiographs: None  Assessment/Plan: 1. Pain due to onychomycosis of toenail   2. Callus   3. Type 2 diabetes mellitus with vascular disease (HCC)     Patient was evaluated and treated. All patient's and/or POA's questions/concerns addressed on today's visit. Toenails 1-5 debrided in length and girth without incident. Callus(es) medial IPJ of left great toe, plantar IPJ of right great toe, and 5th metatarsal head left lower extremity pared with sharp debridement without incident. Continue daily foot inspections and monitor blood glucose per PCP/Endocrinologist's recommendations. Continue soft, supportive shoe gear daily. Report any pedal injuries to medical professional. Call office if there are any questions/concerns. Return in about 10 weeks (around 10/26/2024).  [name], DPM      [place] LOCATION: [address]  [place], [place] [address]                   Office [phone]   [facility] LOCATION: [address] [place], [place] [address] Office [phone]

## 2024-09-18 NOTE — Progress Notes (Addendum)
 Chelsey [name]                                          MRN: [id number]   09/18/2024   The [facility] Quality Team Specialist reviewed this patient medical record for the purposes of chart review for care gap closure. The following were reviewed: abstraction for care gap closure-glycemic status assessment. ABSTRACTED UPDATED GSD    [facility] Quality Team

## 2024-10-03 ENCOUNTER — Ambulatory Visit: Admitting: Nurse Practitioner

## 2024-10-03 ENCOUNTER — Encounter: Payer: Self-pay | Admitting: Nurse Practitioner

## 2024-10-03 VITALS — BP 128/78 | HR 78 | Temp 95.0°F | Resp 16 | Ht 65.0 in | Wt 214.8 lb

## 2024-10-03 DIAGNOSIS — E034 Atrophy of thyroid (acquired): Secondary | ICD-10-CM

## 2024-10-03 DIAGNOSIS — J9611 Chronic respiratory failure with hypoxia: Secondary | ICD-10-CM | POA: Diagnosis not present

## 2024-10-03 DIAGNOSIS — E1169 Type 2 diabetes mellitus with other specified complication: Secondary | ICD-10-CM

## 2024-10-03 DIAGNOSIS — Z79899 Other long term (current) drug therapy: Secondary | ICD-10-CM | POA: Diagnosis not present

## 2024-10-03 DIAGNOSIS — D869 Sarcoidosis, unspecified: Secondary | ICD-10-CM

## 2024-10-03 DIAGNOSIS — T82898A Other specified complication of vascular prosthetic devices, implants and grafts, initial encounter: Secondary | ICD-10-CM

## 2024-10-03 DIAGNOSIS — Z794 Long term (current) use of insulin: Secondary | ICD-10-CM

## 2024-10-03 DIAGNOSIS — M0580 Other rheumatoid arthritis with rheumatoid factor of unspecified site: Secondary | ICD-10-CM | POA: Diagnosis not present

## 2024-10-03 DIAGNOSIS — I739 Peripheral vascular disease, unspecified: Secondary | ICD-10-CM | POA: Diagnosis not present

## 2024-10-03 DIAGNOSIS — I152 Hypertension secondary to endocrine disorders: Secondary | ICD-10-CM

## 2024-10-03 DIAGNOSIS — E1122 Type 2 diabetes mellitus with diabetic chronic kidney disease: Secondary | ICD-10-CM

## 2024-10-03 DIAGNOSIS — E1159 Type 2 diabetes mellitus with other circulatory complications: Secondary | ICD-10-CM

## 2024-10-03 DIAGNOSIS — N1832 Chronic kidney disease, stage 3b: Secondary | ICD-10-CM | POA: Diagnosis not present

## 2024-10-03 DIAGNOSIS — J449 Chronic obstructive pulmonary disease, unspecified: Secondary | ICD-10-CM

## 2024-10-03 DIAGNOSIS — E785 Hyperlipidemia, unspecified: Secondary | ICD-10-CM

## 2024-10-03 LAB — POCT GLYCOSYLATED HEMOGLOBIN (HGB A1C): Hemoglobin A1C: 8.2 % — AB (ref 4.0–5.6)

## 2024-10-03 MED ORDER — ATORVASTATIN CALCIUM 10 MG PO TABS: ORAL_TABLET | ORAL | 3 refills | Status: DC

## 2024-10-03 MED ORDER — OLMESARTAN-AMLODIPINE-HCTZ 40-10-25 MG PO TABS: 1.0000 | ORAL_TABLET | Freq: Every day | ORAL | 1 refills | Status: DC

## 2024-10-03 MED ORDER — FERROUS SULFATE 325 (65 FE) MG PO TABS: 325.0000 mg | ORAL_TABLET | Freq: Every day | ORAL | 1 refills | Status: AC

## 2024-10-03 MED ORDER — DAPAGLIFLOZIN PROPANEDIOL 10 MG PO TABS: 10.0000 mg | ORAL_TABLET | Freq: Every day | ORAL | 1 refills | Status: AC

## 2024-10-03 MED ORDER — CILOSTAZOL 50 MG PO TABS: 50.0000 mg | ORAL_TABLET | Freq: Two times a day (BID) | ORAL | 1 refills | Status: AC

## 2024-10-03 MED ORDER — FLUTICASONE-SALMETEROL 250-50 MCG/ACT IN AEPB: INHALATION_SPRAY | RESPIRATORY_TRACT | 2 refills | Status: AC

## 2024-10-03 MED ORDER — GABAPENTIN 300 MG PO CAPS
300.0000 mg | ORAL_CAPSULE | Freq: Three times a day (TID) | ORAL | 1 refills | Status: AC
Start: 2024-10-03 — End: ?

## 2024-10-03 MED ORDER — OMEPRAZOLE 40 MG PO CPDR
40.0000 mg | DELAYED_RELEASE_CAPSULE | Freq: Every day | ORAL | 3 refills | Status: AC
Start: 2024-10-03 — End: ?

## 2024-10-03 MED ORDER — EZETIMIBE 10 MG PO TABS: 10.0000 mg | ORAL_TABLET | Freq: Every day | ORAL | 1 refills | Status: AC

## 2024-10-03 MED ORDER — FEBUXOSTAT 40 MG PO TABS: 40.0000 mg | ORAL_TABLET | Freq: Every day | ORAL | 3 refills | Status: AC

## 2024-10-03 MED ORDER — CELECOXIB 100 MG PO CAPS: ORAL_CAPSULE | ORAL | 1 refills | Status: AC

## 2024-10-03 MED ORDER — LEVOTHYROXINE SODIUM 50 MCG PO TABS: ORAL_TABLET | ORAL | 3 refills | Status: AC

## 2024-10-03 MED ORDER — FUROSEMIDE 20 MG PO TABS: 20.0000 mg | ORAL_TABLET | Freq: Every day | ORAL | 3 refills | Status: DC | PRN

## 2024-10-03 MED ORDER — LINACLOTIDE 145 MCG PO CAPS: 145.0000 ug | ORAL_CAPSULE | Freq: Every day | ORAL | 4 refills | Status: DC

## 2024-10-03 MED ORDER — HYDROXYCHLOROQUINE SULFATE 200 MG PO TABS: 200.0000 mg | ORAL_TABLET | Freq: Two times a day (BID) | ORAL | 1 refills | Status: AC

## 2024-10-03 NOTE — Progress Notes (Signed)
 [facility] [address] [place], [place] [address]  Internal MEDICINE  Office Visit Note  Patient Name: Chelsey [name]  [id number]  [id number]  Date of Service: 10/03/2024  Chief Complaint  Patient presents with   Gastroesophageal Reflux   Diabetes   Hypertension   Follow-up    HPI Chelsey [name] presents for a follow-up visit for diabetes, CKD, hypothyroidism, COPD, lab results, leg pain.  Diabetes -- sees [facility] endocrinology, her A1c is slightly improved.  Stage 3b CKD -- eGFR 30 in August tyhis year  Hypothyroidism -- on 50 mcg levothyroxine  daily. Managed by PCP COPD and respiratory failure Positive RF and history of sarcoidosis and polyarthritis Leg pain related to occluded stent in left leg, waiting for surgery but need A1c to improve further per [facility] vascular surgery.     Current Medication: Outpatient Encounter Medications as of 10/03/2024  Medication Sig   aspirin  EC 81 MG tablet Take 1 tablet (81 mg total) by mouth daily.   atorvastatin  (LIPITOR) 10 MG tablet TAKE ONE TABLET BY MOUTH ONCE DAILY FOR cholesterol   BAQSIMI TWO PACK 3 MG/DOSE POWD    celecoxib  (CELEBREX ) 100 MG capsule TAKE 1 CAPSULE(100 MG) BY MOUTH DAILY   chlorhexidine  (PERIDEX ) 0.12 % solution    cilostazol  (PLETAL ) 50 MG tablet Take 1 tablet (50 mg total) by mouth 2 (two) times daily.   Continuous Glucose Receiver (DEXCOM G7 RECEIVER) DEVI See admin instructions.   dapagliflozin  propanediol (FARXIGA ) 10 MG TABS tablet Take 1 tablet (10 mg total) by mouth daily.   diclofenac Sodium (VOLTAREN) 1 % GEL Apply 2 g topically 4 (four) times daily.   ezetimibe  (ZETIA ) 10 MG tablet Take 1 tablet (10 mg total) by mouth daily.   febuxostat  (ULORIC ) 40 MG tablet Take 1 tablet (40 mg total) by mouth daily.   ferrous sulfate  (FEROSUL) 325 (65 FE) MG tablet Take 1 tablet (325 mg total) by mouth daily with breakfast.   fluticasone -salmeterol (ADVAIR) 250-50 MCG/ACT AEPB INHALE 1 PUFF BY MOUTH INTO LUNGS  IN THE MORNING and AT BEDTIME   furosemide  (LASIX ) 20 MG tablet Take 1 tablet (20 mg total) by mouth daily as needed (increased swelling of legs).   gabapentin  (NEURONTIN ) 300 MG capsule Take 1 capsule (300 mg total) by mouth 3 (three) times daily.   glucose blood test strip Test sugar 4 x aday for uncontrolled dm on insulin  E11.22   hydroxychloroquine  (PLAQUENIL ) 200 MG tablet Take 1 tablet (200 mg total) by mouth 2 (two) times daily.   insulin  lispro (HUMALOG ) 100 UNIT/ML KwikPen Inject 15 units three times daily before each meal   Insulin  Pen Needle (COMFORT EZ PEN NEEDLES) 31G X 8 MM MISC Twice a day.   LANCETS ULTRA THIN MISC Apply 1 each topically daily.    LANTUS  SOLOSTAR 100 UNIT/ML Solostar Pen Inject 50 units once daily, at the same time every day   levothyroxine  (SYNTHROID ) 50 MCG tablet TAKE ONE TABLET BY MOUTH EVERY MORNING ON AN EMPTY STOMACH   lidocaine  (XYLOCAINE ) 2 % solution Use as directed 15 mLs in the mouth or throat 3 (three) times daily as needed for mouth pain.   linaclotide  (LINZESS ) 145 MCG CAPS capsule Take 1 capsule (145 mcg total) by mouth daily before breakfast.   Olmesartan -amLODIPine -HCTZ 40-10-25 MG TABS Take 1 tablet by mouth daily.   omeprazole  (PRILOSEC) 40 MG capsule Take 1 capsule (40 mg total) by mouth daily.   OXYGEN  Inhale 3 L into the lungs. Nighttime use (Patient  not taking: Reported on 10/03/2024)   OZEMPIC , 2 MG/DOSE, 8 MG/3ML SOPN Inject 2 mg into the skin once a week.   Vitamin D , Ergocalciferol , (DRISDOL ) 1.25 MG (50000 UNIT) CAPS capsule Take 1 capsule (50,000 Units total) by mouth every 7 (seven) days.   [DISCONTINUED] atorvastatin  (LIPITOR) 10 MG tablet TAKE ONE TABLET BY MOUTH ONCE DAILY FOR cholesterol   [DISCONTINUED] celecoxib  (CELEBREX ) 100 MG capsule TAKE 1 CAPSULE(100 MG) BY MOUTH DAILY   [DISCONTINUED] cilostazol  (PLETAL ) 50 MG tablet Take 1 tablet (50 mg total) by mouth 2 (two) times daily.   [DISCONTINUED] Continuous Glucose Sensor  (FREESTYLE LIBRE 2 SENSOR) MISC Use as directed every 15 days DXE11.65 (Patient not taking: Reported on 10/03/2024)   [DISCONTINUED] dapagliflozin  propanediol (FARXIGA ) 10 MG TABS tablet Take 1 tablet (10 mg total) by mouth daily.   [DISCONTINUED] ezetimibe  (ZETIA ) 10 MG tablet Take 1 tablet (10 mg total) by mouth daily.   [DISCONTINUED] febuxostat  (ULORIC ) 40 MG tablet Take 1 tablet (40 mg total) by mouth daily.   [DISCONTINUED] ferrous sulfate  (FEROSUL) 325 (65 FE) MG tablet Take 1 tablet (325 mg total) by mouth daily with breakfast.   [DISCONTINUED] fluticasone -salmeterol (ADVAIR) 250-50 MCG/ACT AEPB INHALE 1 PUFF BY MOUTH INTO LUNGS IN THE MORNING and AT BEDTIME   [DISCONTINUED] furosemide  (LASIX ) 20 MG tablet Take 1 tablet (20 mg total) by mouth daily as needed (increased swelling of legs).   [DISCONTINUED] gabapentin  (NEURONTIN ) 300 MG capsule Take 1 capsule (300 mg total) by mouth 3 (three) times daily.   [DISCONTINUED] hydroxychloroquine  (PLAQUENIL ) 200 MG tablet Take 1 tablet (200 mg total) by mouth 2 (two) times daily.   [DISCONTINUED] levothyroxine  (SYNTHROID ) 50 MCG tablet TAKE ONE TABLET BY MOUTH EVERY MORNING ON AN EMPTY STOMACH   [DISCONTINUED] linaclotide  (LINZESS ) 145 MCG CAPS capsule Take 1 capsule (145 mcg total) by mouth daily before breakfast.   [DISCONTINUED] Olmesartan -amLODIPine -HCTZ 40-10-25 MG TABS Take 1 tablet by mouth daily.   [DISCONTINUED] omeprazole  (PRILOSEC) 40 MG capsule Take 1 capsule (40 mg total) by mouth daily.   [DISCONTINUED] tretinoin (RETIN-A) 0.025 % cream Apply topically. (Patient not taking: Reported on 10/03/2024)   No facility-administered encounter medications on file as of 10/03/2024.    Surgical History: Past Surgical History:  Procedure Laterality Date   BRAIN SURGERY  1990's   CATARACT EXTRACTION W/PHACO Left 06/11/2016   Procedure: CATARACT EXTRACTION PHACO AND INTRAOCULAR LENS PLACEMENT (IOC);  Surgeon: Elsie Carmine, MD;  Location: [facility]  ORS;  Service: Ophthalmology;  Laterality: Left;  US  1.01 AP% 21.6 CDE 13.23 Fluid pack lot # [id number]   CEREBRAL ANEURYSM REPAIR  1990's   COILS   CORONARY ARTERY BYPASS GRAFT     EYE SURGERY  2000's   bilateral cataract   FRACTURE SURGERY     HERNIA REPAIR  2000   ventral   STENT PLACEMENT VASCULAR ([facility] HX)     VEIN BYPASS SURGERY     VENTRAL HERNIA REPAIR N/A 04/29/2016   Procedure: Laparoscopic HERNIA REPAIR VENTRAL ADULT;  Surgeon: Laneta JULIANNA Luna, MD;  Location: [facility] ORS;  Service: General;  Laterality: N/A;    Medical History: Past Medical History:  Diagnosis Date   Anemia    Aneurysm    Arthritis    RHEUMATOID ARTHRITIS   Asthma    Collagen vascular disease    COPD (chronic obstructive pulmonary disease) (HCC)    Coronary artery disease    Dementia (HCC)    Diabetes mellitus without complication (HCC)    Edema  FEET/LEGS   GERD (gastroesophageal reflux disease)    Gout    H/O wheezing    History of hiatal hernia    Hypertension    Hypothyroidism    Neuropathy    Oxygen  deficiency    HS   Peripheral vascular disease    Sarcoidosis of lung    Seizures (HCC)    WITH BRAIN 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    Sleep apnea    OXYGEN  AT NIGHT 3 L [place]    Family History: Family History  Problem Relation Age of Onset   Heart disease Mother    Hypertension Mother    Diabetes Mother    Diabetes Father    Heart disease Father    Hypertension Father    Breast cancer Maternal Aunt     Social History   Socioeconomic History   Marital status: Single    Spouse name: Not on file   Number of children: Not on file   Years of education: Not on file   Highest education level: Not on file  Occupational History   Not on file  Tobacco Use   Smoking status: Former    Current packs/day: 0.00    Average packs/day: 1 pack/day for 30.0 years (30.0 ttl pk-yrs)    Types: Cigarettes    Start date: 01/28/1976    Quit date: 01/27/2006    Years since quitting: 18.6    Passive  exposure: Past   Smokeless tobacco: Never   Tobacco comments:    quit   Vaping Use   Vaping status: Never Used  Substance and Sexual Activity   Alcohol use: No    Alcohol/week: 0.0 standard drinks of alcohol   Drug use: No   Sexual activity: Not on file  Other Topics Concern   Not on file  Social History Narrative   Not on file   Social Drivers of Health   Financial Resource Strain: Low Risk  (04/25/2024)   Received from [facility]   Overall Financial Resource Strain (CARDIA)    Difficulty of Paying Living Expenses: Not hard at all  Food Insecurity: No Food Insecurity (04/25/2024)   Received from [facility]   Hunger Vital Sign    Within the past 12 months, you worried that your food would run out before you got the money to buy more.: Never true    Within the past 12 months, the food you bought just didn't last and you didn't have money to get more.: Never true  Transportation Needs: No Transportation Needs (04/25/2024)   Received from [facility] - Transportation    In the past 12 months, has lack of transportation kept you from medical appointments or from getting medications?: No    Lack of Transportation (Non-Medical): No  Physical Activity: Not on file  Stress: Not on file  Social Connections: Not on file  Intimate Partner Violence: Not on file      Review of Systems  Constitutional:  Positive for fatigue. Negative for activity change, appetite change, chills, fever and unexpected weight change.  HENT:  Positive for congestion and mouth sores. Negative for ear pain, rhinorrhea, sore throat and trouble swallowing.        Sore tongue  Eyes: Negative.   Respiratory: Negative.  Negative for cough, chest tightness, shortness of breath and wheezing.   Cardiovascular: Negative.  Negative for chest pain and palpitations.  Gastrointestinal: Negative.  Negative for abdominal pain, blood in stool, constipation,  diarrhea, nausea and vomiting.  Endocrine: Negative.   Genitourinary: Negative.  Negative for difficulty urinating, dysuria, frequency, hematuria and urgency.  Musculoskeletal:  Positive for arthralgias, back pain and gait problem. Negative for joint swelling, myalgias and neck pain.  Skin: Negative.  Negative for rash and wound.  Allergic/Immunologic: Negative.  Negative for immunocompromised state.  Neurological:  Positive for weakness (lower extremities). Negative for dizziness, seizures, numbness and headaches.  Hematological: Negative.   Psychiatric/Behavioral: Negative.  Negative for behavioral problems, self-injury, sleep disturbance and suicidal ideas. The patient is not nervous/anxious.     Vital Signs: BP 128/78   Pulse 78   Temp (!) 95 F (35 C)   Resp 16   Ht 5' 5 (1.651 m)   Wt 214 lb 12.8 oz (97.4 kg)   SpO2 94%   BMI 35.74 kg/m    Physical Exam Vitals reviewed.  Constitutional:      General: She is awake. She is not in acute distress.    Appearance: Normal appearance. She is well-developed and well-groomed. She is obese. She is not ill-appearing or diaphoretic.  HENT:     Head: Normocephalic and atraumatic.     Right Ear: Tympanic membrane, ear canal and external ear normal.     Left Ear: Tympanic membrane, ear canal and external ear normal.     Nose: Septal deviation present. No congestion or rhinorrhea.     Right Turbinates: Enlarged.     Left Turbinates: Enlarged.     Mouth/Throat:     Lips: Pink.     Mouth: Mucous membranes are moist. Oral lesions present.     Dentition: Abnormal dentition.     Tongue: Lesions present.     Pharynx: Oropharynx is clear. Uvula midline. No oropharyngeal exudate or posterior oropharyngeal erythema.  Eyes:     General: Lids are normal. Vision grossly intact. Gaze aligned appropriately. No scleral icterus.       Right eye: No discharge.        Left eye: No discharge.     Extraocular Movements: Extraocular movements intact.      Conjunctiva/sclera: Conjunctivae normal.     Pupils: Pupils are equal, round, and reactive to light.     Funduscopic exam:    Right eye: Red reflex present.        Left eye: Red reflex present. Neck:     Thyroid : No thyromegaly.     Vascular: No JVD.     Trachea: Trachea and phonation normal. No tracheal deviation.  Cardiovascular:     Rate and Rhythm: Normal rate and regular rhythm.     Pulses:          Carotid pulses are 3+ on the right side and 3+ on the left side.      Radial pulses are 2+ on the right side and 2+ on the left side.       Dorsalis pedis pulses are 2+ on the right side and 2+ on the left side.       Posterior tibial pulses are 2+ on the right side and 2+ on the left side.     Heart sounds: Normal heart sounds, S1 normal and S2 normal. No murmur heard.    No friction rub. No gallop.  Pulmonary:     Effort: Pulmonary effort is normal. No accessory muscle usage or respiratory distress.     Breath sounds: Normal breath sounds and air entry. No stridor. No wheezing or rales.  Chest:     Chest  wall: No tenderness.  Breasts:    Breasts are symmetrical.     Right: Normal. No swelling, bleeding, inverted nipple, mass, nipple discharge, skin change or tenderness.     Left: Normal. No swelling, bleeding, inverted nipple, mass, nipple discharge, skin change or tenderness.     Comments: Declined clinical breast exam and mammogram. Abdominal:     General: Bowel sounds are normal. There is no distension.     Palpations: Abdomen is soft. There is no shifting dullness, fluid wave, mass or pulsatile mass.     Tenderness: There is no abdominal tenderness. There is no guarding or rebound.  Musculoskeletal:        General: No tenderness or deformity.     Cervical back: Normal range of motion and neck supple.     Right lower leg: Edema present.     Left lower leg: Edema present.     Right foot: Decreased range of motion. Bunion present.     Left foot: Decreased range of  motion. Bunion present.  Feet:     Right foot:     Protective Sensation: 6 sites tested.  6 sites sensed.     Skin integrity: Callus and dry skin present. No ulcer, blister, skin breakdown, erythema, warmth or fissure.     Toenail Condition: Right toenails are abnormally thick.     Left foot:     Protective Sensation: 6 sites tested.  6 sites sensed.     Skin integrity: Callus and dry skin present. No ulcer, blister, skin breakdown, erythema, warmth or fissure.     Toenail Condition: Left toenails are abnormally thick.  Lymphadenopathy:     Cervical: No cervical adenopathy.     Upper Body:     Right upper body: No supraclavicular, axillary or pectoral adenopathy.     Left upper body: No supraclavicular, axillary or pectoral adenopathy.  Skin:    General: Skin is warm and dry.     Capillary Refill: Capillary refill takes less than 2 seconds.     Coloration: Skin is not pale.     Findings: No erythema or rash.  Neurological:     Mental Status: She is alert and oriented to person, place, and time.     Cranial Nerves: No cranial nerve deficit.     Motor: No abnormal muscle tone.     Coordination: Coordination normal.     Gait: Gait normal.     Deep Tendon Reflexes: Reflexes are normal and symmetric.  Psychiatric:        Mood and Affect: Mood and affect normal.        Behavior: Behavior normal. Behavior is cooperative.        Thought Content: Thought content normal.        Judgment: Judgment normal.        Assessment/Plan: 1. Type 2 diabetes mellitus with stage 3b chronic kidney disease, with long-term current use of insulin  (HCC) (Primary) A1c is still elevated, she sees [facility] endocrinology. Continue medications as prescribed.  - POCT glycosylated hemoglobin (Hb A1C) - dapagliflozin  propanediol (FARXIGA ) 10 MG TABS tablet; Take 1 tablet (10 mg total) by mouth daily.  Dispense: 90 tablet; Refill: 1  2. CKD stage 3b, GFR 30-44 ml/min (HCC) Referred to nephrology  - Ambulatory  referral to Nephrology  3. Hypertension associated with type 2 diabetes mellitus (HCC) Stable, Continue medications as prescribed  - Olmesartan -amLODIPine -HCTZ 40-10-25 MG TABS; Take 1 tablet by mouth daily.  Dispense: 90 tablet; Refill: 1 - furosemide  (  LASIX ) 20 MG tablet; Take 1 tablet (20 mg total) by mouth daily as needed (increased swelling of legs).  Dispense: 30 tablet; Refill: 3  4. Hyperlipidemia associated with type 2 diabetes mellitus (HCC) Continue atorvastatin  and ezetimibe  as prescribed.  - ezetimibe  (ZETIA ) 10 MG tablet; Take 1 tablet (10 mg total) by mouth daily.  Dispense: 90 tablet; Refill: 1 - atorvastatin  (LIPITOR) 10 MG tablet; TAKE ONE TABLET BY MOUTH ONCE DAILY FOR cholesterol  Dispense: 90 tablet; Refill: 3  5. Chronic respiratory failure with hypoxia (HCC) Continue advair as prescribed  - fluticasone -salmeterol (ADVAIR) 250-50 MCG/ACT AEPB; INHALE 1 PUFF BY MOUTH INTO LUNGS IN THE MORNING and AT BEDTIME  Dispense: 60 each; Refill: 2  6. Chronic obstructive pulmonary disease, unspecified COPD type (HCC) Continue advair as prescribed.  - fluticasone -salmeterol (ADVAIR) 250-50 MCG/ACT AEPB; INHALE 1 PUFF BY MOUTH INTO LUNGS IN THE MORNING and AT BEDTIME  Dispense: 60 each; Refill: 2  7. Peripheral vascular disease Continue cilostazol  as prescribed  - cilostazol  (PLETAL ) 50 MG tablet; Take 1 tablet (50 mg total) by mouth 2 (two) times daily.  Dispense: 180 tablet; Refill: 1  8. Occlusion of femoroperoneal bypass graft Continue cilostazol  as prescribed  - cilostazol  (PLETAL ) 50 MG tablet; Take 1 tablet (50 mg total) by mouth 2 (two) times daily.  Dispense: 180 tablet; Refill: 1  9. Hypothyroidism due to acquired atrophy of thyroid  Continue levothyroxine  as prescribed  - levothyroxine  (SYNTHROID ) 50 MCG tablet; TAKE ONE TABLET BY MOUTH EVERY MORNING ON AN EMPTY STOMACH  Dispense: 90 tablet; Refill: 3  10. Polyarthritis with positive rheumatoid factor (HCC) Continue  medications as prescribed.  - celecoxib  (CELEBREX ) 100 MG capsule; TAKE 1 CAPSULE(100 MG) BY MOUTH DAILY  Dispense: 90 capsule; Refill: 1 - hydroxychloroquine  (PLAQUENIL ) 200 MG tablet; Take 1 tablet (200 mg total) by mouth 2 (two) times daily.  Dispense: 180 tablet; Refill: 1  11. Sarcoidosis, nodular type Sees rheumatology   12. Severe obesity (BMI 35.0-39.9) with comorbidity (HCC) Not losing weight on GLP1 medications. Consider checking for hypercortisolism.   13. Encounter for medication review Medication list reviewed, updated and refills ordered  - omeprazole  (PRILOSEC) 40 MG capsule; Take 1 capsule (40 mg total) by mouth daily.  Dispense: 90 capsule; Refill: 3 - linaclotide  (LINZESS ) 145 MCG CAPS capsule; Take 1 capsule (145 mcg total) by mouth daily before breakfast.  Dispense: 30 capsule; Refill: 4 - ferrous sulfate  (FEROSUL) 325 (65 FE) MG tablet; Take 1 tablet (325 mg total) by mouth daily with breakfast.  Dispense: 90 tablet; Refill: 1 - febuxostat  (ULORIC ) 40 MG tablet; Take 1 tablet (40 mg total) by mouth daily.  Dispense: 90 tablet; Refill: 3 - gabapentin  (NEURONTIN ) 300 MG capsule; Take 1 capsule (300 mg total) by mouth 3 (three) times daily.  Dispense: 270 capsule; Refill: 1   General Counseling: Chelsey [name] verbalizes understanding of the findings of todays visit and agrees with plan of treatment. I have discussed any further diagnostic evaluation that may be needed or ordered today. We also reviewed her medications today. she has been encouraged to call the office with any questions or concerns that should arise related to todays visit.    Orders Placed This Encounter  Procedures   Ambulatory referral to Nephrology   POCT glycosylated hemoglobin (Hb A1C)    Meds ordered this encounter  Medications   omeprazole  (PRILOSEC) 40 MG capsule    Sig: Take 1 capsule (40 mg total) by mouth daily.    Dispense:  90  capsule    Refill:  3    Note increased dose, fill new script asap    Olmesartan -amLODIPine -HCTZ 40-10-25 MG TABS    Sig: Take 1 tablet by mouth daily.    Dispense:  90 tablet    Refill:  1   linaclotide  (LINZESS ) 145 MCG CAPS capsule    Sig: Take 1 capsule (145 mcg total) by mouth daily before breakfast.    Dispense:  30 capsule    Refill:  4    Fill new script today   levothyroxine  (SYNTHROID ) 50 MCG tablet    Sig: TAKE ONE TABLET BY MOUTH EVERY MORNING ON AN EMPTY STOMACH    Dispense:  90 tablet    Refill:  3   furosemide  (LASIX ) 20 MG tablet    Sig: Take 1 tablet (20 mg total) by mouth daily as needed (increased swelling of legs).    Dispense:  30 tablet    Refill:  3   ferrous sulfate  (FEROSUL) 325 (65 FE) MG tablet    Sig: Take 1 tablet (325 mg total) by mouth daily with breakfast.    Dispense:  90 tablet    Refill:  1   ezetimibe  (ZETIA ) 10 MG tablet    Sig: Take 1 tablet (10 mg total) by mouth daily.    Dispense:  90 tablet    Refill:  1   dapagliflozin  propanediol (FARXIGA ) 10 MG TABS tablet    Sig: Take 1 tablet (10 mg total) by mouth daily.    Dispense:  90 tablet    Refill:  1   atorvastatin  (LIPITOR) 10 MG tablet    Sig: TAKE ONE TABLET BY MOUTH ONCE DAILY FOR cholesterol    Dispense:  90 tablet    Refill:  3   celecoxib  (CELEBREX ) 100 MG capsule    Sig: TAKE 1 CAPSULE(100 MG) BY MOUTH DAILY    Dispense:  90 capsule    Refill:  1   cilostazol  (PLETAL ) 50 MG tablet    Sig: Take 1 tablet (50 mg total) by mouth 2 (two) times daily.    Dispense:  180 tablet    Refill:  1   febuxostat  (ULORIC ) 40 MG tablet    Sig: Take 1 tablet (40 mg total) by mouth daily.    Dispense:  90 tablet    Refill:  3   fluticasone -salmeterol (ADVAIR) 250-50 MCG/ACT AEPB    Sig: INHALE 1 PUFF BY MOUTH INTO LUNGS IN THE MORNING and AT BEDTIME    Dispense:  60 each    Refill:  2    This prescription was filled on 05/06/2022. Any refills authorized will be placed on file.   gabapentin  (NEURONTIN ) 300 MG capsule    Sig: Take 1 capsule (300 mg total) by  mouth 3 (three) times daily.    Dispense:  270 capsule    Refill:  1   hydroxychloroquine  (PLAQUENIL ) 200 MG tablet    Sig: Take 1 tablet (200 mg total) by mouth 2 (two) times daily.    Dispense:  180 tablet    Refill:  1    Return in about 2 months (around 12/03/2024) for F/U, Saif Peter PCP.   Total time spent:30 Minutes Time spent includes review of chart, medications, test results, and follow up plan with the patient.   [place] Controlled Substance Database was reviewed by me.  This patient was seen by Mardy Maxin, FNP-C in collaboration with Dr. Sigrid Bathe as a part of collaborative care agreement.  Chelsey Tercero R. Liana, MSN, FNP-C Internal medicine

## 2024-10-04 ENCOUNTER — Telehealth: Payer: Self-pay | Admitting: Internal Medicine

## 2024-10-04 ENCOUNTER — Telehealth: Payer: Self-pay | Admitting: Nurse Practitioner

## 2024-10-04 NOTE — Telephone Encounter (Signed)
 Awaiting 10/03/24 office notes for Nephrology referral-Toni

## 2024-10-04 NOTE — Telephone Encounter (Signed)
 Attempted to contact patient to schedule appointment w/ dfk. No answer and vm not set up. I s/w Fairy, he will go by her home and have her call office-Toni

## 2024-10-10 ENCOUNTER — Encounter: Payer: Self-pay | Admitting: Internal Medicine

## 2024-10-10 ENCOUNTER — Other Ambulatory Visit: Payer: Self-pay

## 2024-10-10 ENCOUNTER — Ambulatory Visit (INDEPENDENT_AMBULATORY_CARE_PROVIDER_SITE_OTHER): Admitting: Internal Medicine

## 2024-10-10 ENCOUNTER — Telehealth: Payer: Self-pay

## 2024-10-10 VITALS — BP 132/70 | HR 75 | Temp 98.0°F | Resp 16 | Ht 65.0 in | Wt 218.6 lb

## 2024-10-10 DIAGNOSIS — E1122 Type 2 diabetes mellitus with diabetic chronic kidney disease: Secondary | ICD-10-CM

## 2024-10-10 DIAGNOSIS — I152 Hypertension secondary to endocrine disorders: Secondary | ICD-10-CM

## 2024-10-10 DIAGNOSIS — N1832 Chronic kidney disease, stage 3b: Secondary | ICD-10-CM

## 2024-10-10 DIAGNOSIS — E1159 Type 2 diabetes mellitus with other circulatory complications: Secondary | ICD-10-CM | POA: Diagnosis not present

## 2024-10-10 DIAGNOSIS — Z794 Long term (current) use of insulin: Secondary | ICD-10-CM

## 2024-10-10 MED ORDER — SEMAGLUTIDE (1 MG/DOSE) 4 MG/3ML ~~LOC~~ SOPN: PEN_INJECTOR | SUBCUTANEOUS | 4 refills | Status: DC

## 2024-10-10 MED ORDER — OZEMPIC (2 MG/DOSE) 8 MG/3ML ~~LOC~~ SOPN: PEN_INJECTOR | SUBCUTANEOUS | 4 refills | Status: AC

## 2024-10-10 NOTE — Progress Notes (Addendum)
 [facility] [address] [place], [place] [address]  Internal MEDICINE  Office Visit Note  Patient Name: Chelsey [name]  [id number]  [id number]  Date of Service: 10/30/2024  Chief Complaint  Patient presents with   Follow-up    Diabetes, GERD, HTN   Quality Metric Gaps    Shingles Vaccine    HPI Pt is seen for follow up Uncontrolled diabetes, she is on multiple agents with fluctuations between high and low  She is on Lantus  50 units and mealtime Humalog  15-30 units before each meal She is also on Farxiga  and Ozempic   She is also on Celebrex  CKD stage 3b on recent labs     Current Medication: Outpatient Encounter Medications as of 10/10/2024  Medication Sig   aspirin  EC 81 MG tablet Take 1 tablet (81 mg total) by mouth daily.   BAQSIMI TWO PACK 3 MG/DOSE POWD    celecoxib  (CELEBREX ) 100 MG capsule TAKE 1 CAPSULE(100 MG) BY MOUTH DAILY   chlorhexidine  (PERIDEX ) 0.12 % solution    cilostazol  (PLETAL ) 50 MG tablet Take 1 tablet (50 mg total) by mouth 2 (two) times daily.   Continuous Glucose Receiver (DEXCOM G7 RECEIVER) DEVI See admin instructions.   dapagliflozin  propanediol (FARXIGA ) 10 MG TABS tablet Take 1 tablet (10 mg total) by mouth daily.   diclofenac Sodium (VOLTAREN) 1 % GEL Apply 2 g topically 4 (four) times daily.   ezetimibe  (ZETIA ) 10 MG tablet Take 1 tablet (10 mg total) by mouth daily.   febuxostat  (ULORIC ) 40 MG tablet Take 1 tablet (40 mg total) by mouth daily.   ferrous sulfate  (FEROSUL) 325 (65 FE) MG tablet Take 1 tablet (325 mg total) by mouth daily with breakfast.   fluticasone -salmeterol (ADVAIR) 250-50 MCG/ACT AEPB INHALE 1 PUFF BY MOUTH INTO LUNGS IN THE MORNING and AT BEDTIME   furosemide  (LASIX ) 20 MG tablet Take 1 tablet (20 mg total) by mouth daily as needed (increased swelling of legs).   gabapentin  (NEURONTIN ) 300 MG capsule Take 1 capsule (300 mg total) by mouth 3 (three) times daily.   glucose blood test strip Test sugar 4  x aday for uncontrolled dm on insulin  E11.22   hydroxychloroquine  (PLAQUENIL ) 200 MG tablet Take 1 tablet (200 mg total) by mouth 2 (two) times daily.   insulin  lispro (HUMALOG ) 100 UNIT/ML KwikPen Inject 15 units three times daily before each meal   Insulin  Pen Needle (COMFORT EZ PEN NEEDLES) 31G X 8 MM MISC Twice a day.   LANCETS ULTRA THIN MISC Apply 1 each topically daily.    LANTUS  SOLOSTAR 100 UNIT/ML Solostar Pen Inject 50 units once daily, at the same time every day   levothyroxine  (SYNTHROID ) 50 MCG tablet TAKE ONE TABLET BY MOUTH EVERY MORNING ON AN EMPTY STOMACH   linaclotide  (LINZESS ) 145 MCG CAPS capsule Take 1 capsule (145 mcg total) by mouth daily before breakfast.   Olmesartan -amLODIPine -HCTZ 40-10-25 MG TABS Take 1 tablet by mouth daily.   omeprazole  (PRILOSEC) 40 MG capsule Take 1 capsule (40 mg total) by mouth daily.   [DISCONTINUED] atorvastatin  (LIPITOR) 10 MG tablet TAKE ONE TABLET BY MOUTH ONCE DAILY FOR cholesterol   [DISCONTINUED] lidocaine  (XYLOCAINE ) 2 % solution Use as directed 15 mLs in the mouth or throat 3 (three) times daily as needed for mouth pain.   [DISCONTINUED] OXYGEN  Inhale 3 L into the lungs. Nighttime use   [DISCONTINUED] OZEMPIC , 2 MG/DOSE, 8 MG/3ML SOPN Inject 2 mg into the skin once a week.   [DISCONTINUED]  Semaglutide , 1 MG/DOSE, 4 MG/3ML SOPN Inject 4 mg q week every Wednesday   [DISCONTINUED] Vitamin D , Ergocalciferol , (DRISDOL ) 1.25 MG (50000 UNIT) CAPS capsule Take 1 capsule (50,000 Units total) by mouth every 7 (seven) days.   No facility-administered encounter medications on file as of 10/10/2024.    Surgical History: Past Surgical History:  Procedure Laterality Date   BRAIN SURGERY  1990's   CATARACT EXTRACTION W/PHACO Left 06/11/2016   Procedure: CATARACT EXTRACTION PHACO AND INTRAOCULAR LENS PLACEMENT (IOC);  Surgeon: Elsie Carmine, MD;  Location: [facility] ORS;  Service: Ophthalmology;  Laterality: Left;  US  1.01 AP% 21.6 CDE 13.23 Fluid  pack lot # [id number]   CEREBRAL ANEURYSM REPAIR  1990's   COILS   CORONARY ARTERY BYPASS GRAFT     EYE SURGERY  2000's   bilateral cataract   FRACTURE SURGERY     HERNIA REPAIR  2000   ventral   STENT PLACEMENT VASCULAR ([facility] HX)     VEIN BYPASS SURGERY     VENTRAL HERNIA REPAIR N/A 04/29/2016   Procedure: Laparoscopic HERNIA REPAIR VENTRAL ADULT;  Surgeon: Laneta JULIANNA Luna, MD;  Location: [facility] ORS;  Service: General;  Laterality: N/A;    Medical History: Past Medical History:  Diagnosis Date   Anemia    Aneurysm    Arthritis    RHEUMATOID ARTHRITIS   Asthma    Collagen vascular disease    COPD (chronic obstructive pulmonary disease) (HCC)    Coronary artery disease    Dementia (HCC)    Diabetes mellitus without complication (HCC)    Edema    FEET/LEGS   GERD (gastroesophageal reflux disease)    Gout    H/O wheezing    History of hiatal hernia    Hypertension    Hypothyroidism    Neuropathy    Oxygen  deficiency    HS   Peripheral vascular disease    Sarcoidosis of lung    Seizures (HCC)    WITH BRAIN 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    Sleep apnea    OXYGEN  AT NIGHT 3 L [place]    Family History: Family History  Problem Relation Age of Onset   Heart disease Mother    Hypertension Mother    Diabetes Mother    Diabetes Father    Heart disease Father    Hypertension Father    Breast cancer Maternal Aunt     Social History   Socioeconomic History   Marital status: Single    Spouse name: Not on file   Number of children: Not on file   Years of education: Not on file   Highest education level: Not on file  Occupational History   Not on file  Tobacco Use   Smoking status: Former    Current packs/day: 0.00    Average packs/day: 1 pack/day for 30.0 years (30.0 ttl pk-yrs)    Types: Cigarettes    Start date: 01/28/1976    Quit date: 01/27/2006    Years since quitting: 18.7    Passive exposure: Past   Smokeless tobacco: Never   Tobacco comments:    quit   Vaping  Use   Vaping status: Never Used  Substance and Sexual Activity   Alcohol use: No    Alcohol/week: 0.0 standard drinks of alcohol   Drug use: No   Sexual activity: Not on file  Other Topics Concern   Not on file  Social History Narrative   Not on file   Social Drivers of Health  Tobacco Use: Medium Risk (10/26/2024)   Patient History    Smoking Tobacco Use: Former    Smokeless Tobacco Use: Never    Passive Exposure: Past  Physicist, Medical Strain: Low Risk  (04/25/2024)   Received from [facility]   Overall Financial Resource Strain (CARDIA)    Difficulty of Paying Living Expenses: Not hard at all  Food Insecurity: No Food Insecurity (04/25/2024)   Received from [facility]   Epic    Within the past 12 months, you worried that your food would run out before you got the money to buy more.: Never true    Within the past 12 months, the food you bought just didn't last and you didn't have money to get more.: Never true  Transportation Needs: No Transportation Needs (04/25/2024)   Received from [facility] - Transportation    In the past 12 months, has lack of transportation kept you from medical appointments or from getting medications?: No    Lack of Transportation (Non-Medical): No  Physical Activity: Not on file  Stress: Not on file  Social Connections: Not on file  Intimate Partner Violence: Not on file  Depression (PHQ2-9): Low Risk (10/10/2024)   Depression (PHQ2-9)    PHQ-2 Score: 0  Alcohol Screen: Low Risk (04/14/2022)   Alcohol Screen    Last Alcohol Screening Score (AUDIT): 0  Housing: Unknown (04/25/2024)   Received from [facility]   Epic    In the last 12 months, was there a time when you were not able to pay the mortgage or rent on time?: No    Number of Times Moved in the Last Year: Not on file    At any time in the past 12 months, were you homeless or living in a shelter  (including now)?: No  Utilities: Not At Risk (04/25/2024)   Received from [facility]   Epic    In the past 12 months has the electric, gas, oil, or water company threatened to shut off services in your home?: No  Health Literacy: Not on file      Review of Systems  Constitutional:  Negative for fatigue and fever.  HENT:  Negative for congestion, mouth sores and postnasal drip.   Respiratory:  Negative for cough.   Cardiovascular:  Negative for chest pain.  Genitourinary:  Negative for flank pain.  Psychiatric/Behavioral: Negative.      Vital Signs: BP 132/70   Pulse 75   Temp 98 F (36.7 C)   Resp 16   Ht 5' 5 (1.651 m)   Wt 218 lb 9.6 oz (99.2 kg)   SpO2 98%   BMI 36.38 kg/m    Physical Exam Constitutional:      Appearance: Normal appearance.  HENT:     Head: Normocephalic and atraumatic.     Nose: Nose normal.     Mouth/Throat:     Mouth: Mucous membranes are moist.     Pharynx: No posterior oropharyngeal erythema.  Eyes:     Extraocular Movements: Extraocular movements intact.     Pupils: Pupils are equal, round, and reactive to light.  Cardiovascular:     Pulses: Normal pulses.     Heart sounds: Normal heart sounds.  Pulmonary:     Effort: Pulmonary effort is normal.     Breath sounds: Normal breath sounds.  Neurological:     General: No focal deficit present.  Mental Status: She is alert.  Psychiatric:        Mood and Affect: Mood normal.        Behavior: Behavior normal.        Assessment/Plan: 1. Type 2 diabetes mellitus with stage 3b chronic kidney disease, with long-term current use of insulin  (HCC) (Primary) Will increase Lantus  to 55 units in am, DC humalog  due to risk of hypoglycemia Increase Ozempic  if needed , continue Faxiga as before  - Basic Metabolic Panel (BMET)   2. Hypertension associated with type 2 diabetes mellitus (HCC) Controlled, will monitor  - Basic Metabolic Panel (BMET)  3. Severe obesity  (BMI 35.0-39.9) with comorbidity (HCC) Obesity Counseling: Risk Assessment: An assessment of behavioral risk factors was made today and includes lack of exercise sedentary lifestyle, lack of portion control and poor dietary habits.  Risk Modification Advice: She was counseled on portion control guidelines. Restricting daily caloric intake to 1500 ADA. The detrimental long term effects of obesity on her health and ongoing poor compliance was also discussed with the patient.     General Counseling: Chelsey [name] verbalizes understanding of the findings of todays visit and agrees with plan of treatment. I have discussed any further diagnostic evaluation that may be needed or ordered today. We also reviewed her medications today. she has been encouraged to call the office with any questions or concerns that should arise related to todays visit.    Orders Placed This Encounter  Procedures   Basic Metabolic Panel (BMET)    Meds ordered this encounter  Medications   DISCONTD: Semaglutide , 1 MG/DOSE, 4 MG/3ML SOPN    Sig: Inject 4 mg q week every Wednesday    Dispense:  3 mL    Refill:  4    Total time spent:35 Minutes Time spent includes review of chart, medications, test results, and follow up plan with the patient.   [place] Controlled Substance Database was reviewed by me.   Dr Shuna Tabor M Kydan Shanholtzer Internal medicine

## 2024-10-10 NOTE — Telephone Encounter (Signed)
 [facility] called Ozempic  4 mg is  not avaiable as per dr fernand change back ozempic   2 mg

## 2024-10-17 ENCOUNTER — Telehealth: Payer: Self-pay | Admitting: Nurse Practitioner

## 2024-10-17 NOTE — Telephone Encounter (Signed)
 Nephrology referral sent via [facility] to [facility]. Notified patient. Gave patient telephone # [phone]

## 2024-10-18 ENCOUNTER — Telehealth: Payer: Self-pay | Admitting: Nurse Practitioner

## 2024-10-18 NOTE — Telephone Encounter (Signed)
 Nephrology appointment 0/10/2025 with [facility]

## 2024-10-20 NOTE — Progress Notes (Unsigned)
 Cardiology Clinic Note   Date: 10/23/2024 ID: Chelsey [name] 26-Jan-1951, MRN [id number]  Primary Cardiologist:  Redell Cave, MD  Chief Complaint   Chelsey [name] is a 73 y.o. female who presents to the clinic today for routine follow up.   Patient Profile   Chelsey [name] is followed by Dr. Cave for the history outlined below.      Past medical history significant for: Lower extremity edema. Echo 10/11/2023: EF 60 to 65%.  No RWMA.  Mild LVH.  Grade I DD.  Normal RV size/function.  Aortic valve sclerosis/calcification without stenosis. PAD. S/p right SFA stents (occluded), left CFA-PT OP BPG occluded. Arterial ultrasound lower extremities 06/28/2020: ABI shows mild arterial insufficiency bilateral lower extremities.  Low velocities bilaterally questionable arterial iliac disease.  Left FA stent. Carotid artery disease. Carotid ultrasound 08/16/2020: Bilateral carotid <50% stenosis.  Mild plaque formation on the left and moderate plaque formation on the right. Hypertension. Renal artery ultrasound 01/27/2019: No evidence of renal artery occlusive disease bilaterally.  Normal size kidneys bilaterally. Hyperlipidemia. Lipid panel 06/20/2024: LDL 102, HDL 40, TG 213, total 139. Pulmonary sarcoidosis. Nonalcoholic fatty liver disease. Hypothyroidism. T2DM. Former tobacco use.  In summary, patient was first evaluated by Dr. Cave on 09/27/2023 for lower extremity edema.  She has a history of PAD with multiple interventions followed by [facility] vascular surgery.  At the time of her visit she reported chronic claudication.  She reported lower extremity edema progressive throughout the day.  It was felt this was likely secondary to dependent positioning and venous insufficiency.  She was instructed to utilize compression stockings, elevation and as needed Lasix .  Echo was performed to rule out structural contributor and was normal as outlined above.    Patient was last seen in the office by me on 10/22/2023 for follow-up after echo.  She continued to have chronic lower extremity edema best in the morning and progressing throughout the day.  Activity limited by chronic claudication and knee pain.  She was able to perform light to moderate household chores.  She was pending redo bypass of the left leg and bypass of the right leg and was felt to be an acceptable risk to proceed with surgery.  It appears bypass redo was delayed secondary to patient's elevated A1c.  Vascular surgery wanted patient's A1c <8 to proceed with surgery.     History of Present Illness    Today, patient is doing well. Patient denies orthopnea or PND. She reports occasional R>L lower extremity edema she relates to PAD. She has a lot of knee pain and is hoping vascular will be willing to do bypass surgery soon. She reports dyspnea with heavier exertion. She also gets occasional wheezing. No chest pain, pressure, or tightness. No palpitations. Her activity is limited by leg pain but she is able to perform light to moderate household activities with pacing. She is working hard to get her diabetes under better control. Her last A1c was 8.2 which is down significantly from September 2024 when it was >11.     ROS: All other systems reviewed and are otherwise negative except as noted in History of Present Illness.  EKGs/Labs Reviewed    EKG Interpretation Date/Time:  Monday October 23 2024 08:18:34 EST Ventricular Rate:  70 PR Interval:  184 QRS Duration:  82 QT Interval:  410 QTC Calculation: 442 R Axis:   -17  Text Interpretation: Normal sinus rhythm When compared with ECG of 27-Sep-2023 09:19, No significant change  was found Confirmed by Loistine Sober [phone]) on 10/23/2024 8:21:38 AM   06/20/2024: ALT 18; AST 20; BUN 33; Creatinine, Ser 1.76; Potassium 5.0; Sodium 137   06/20/2024: Hemoglobin 15.8; WBC 4.3   06/20/2024: TSH 4.840    Physical Exam    VS:  BP  124/72   Pulse 70   Ht 5' 4 (1.626 m)   Wt 217 lb (98.4 kg)   SpO2 95%   BMI 37.25 kg/m  , BMI Body mass index is 37.25 kg/m.  GEN: Well nourished, well developed, in no acute distress. Neck: No JVD or carotid bruits. Cardiac:  RRR.  No murmur. No rubs or gallops.   Respiratory:  Respirations regular and unlabored. Clear to auscultation without rales, wheezing or rhonchi. GI: Soft, nontender, nondistended. Extremities: Radials 2+ and equal bilaterally. PT/DP pulses nonpalpable. Lower extremities warm and dry. No clubbing or cyanosis. No edema.  Skin: Warm and dry, no rash. Neuro: Strength intact.  Assessment & Plan   Lower extremity edema Echo November 2024 showed normal LV/RV function, mild LVH, Grade I DD, no significant valvular abnormalities.  Patient reports continued occasional lower extremity edema R>L that she relates to PAD. She denies orthopnea or PND. She has dyspnea with heavier exertion and occasional wheezing. No lower extremity edema today. Lungs clear to auscultation without wheezing.  Euvolemic and well compensated on exam.  - Continue Lasix  as needed. - Continue compression and elevation.   PAD S/p right SFA stents with known occlusion, left CFA -PT OP BPG occluded.  She was unable to undergo bypass surgery last year secondary to A1c >11. She has been working hard to get sugar under better control and last A1c was 8.2 in November. She is pending follow up with vascular.  Nonpalpable PT/DP pulses.  - Continue aspirin , Pletal , atorvastatin , Zetia . - Followed by [facility] vascular surgery.   Hypertension Renal artery ultrasound March 2020 without evidence of renal artery stenosis.  BP today 124/72. No reported headaches or dizziness.  - Continue olmesartan -amlodipine -HCTZ.   Hyperlipidemia LDL 102 August 2025, not at goal.  This is elevated from 1 in May 2024.  Patient is not sure if she missed any medications around the time lipid panel was performed. She states she is  taking all of her medications as prescribed.  - Direct LDL today.  - Continue atorvastatin  and Zetia .  Disposition: Direct LDL today. Return in 1 year or sooner as needed.          Signed, Sober HERO. Kamarie Palma, DNP, NP-C

## 2024-10-23 ENCOUNTER — Encounter: Payer: Self-pay | Admitting: Student

## 2024-10-23 ENCOUNTER — Ambulatory Visit: Attending: Student | Admitting: Student

## 2024-10-23 VITALS — BP 124/72 | HR 70 | Ht 64.0 in | Wt 217.0 lb

## 2024-10-23 DIAGNOSIS — I10 Essential (primary) hypertension: Secondary | ICD-10-CM | POA: Diagnosis not present

## 2024-10-23 DIAGNOSIS — I739 Peripheral vascular disease, unspecified: Secondary | ICD-10-CM

## 2024-10-23 DIAGNOSIS — E785 Hyperlipidemia, unspecified: Secondary | ICD-10-CM | POA: Diagnosis not present

## 2024-10-23 DIAGNOSIS — R600 Localized edema: Secondary | ICD-10-CM | POA: Diagnosis not present

## 2024-10-23 DIAGNOSIS — Z79899 Other long term (current) drug therapy: Secondary | ICD-10-CM

## 2024-10-23 NOTE — Patient Instructions (Signed)
 Medication Instructions:   Your physician recommends that you continue on your current medications as directed. Please refer to the Current Medication list given to you today.    *If you need a refill on your cardiac medications before your next appointment, please call your pharmacy*  Lab Work:  Your provider would like for you to have following labs drawn today Direct LDL.    If you have labs (blood work) drawn today and your tests are completely normal, you will receive your results only by:  MyChart Message (if you have MyChart) OR  A paper copy in the mail If you have any lab test that is abnormal or we need to change your treatment, we will call you to review the results.  Testing/Procedures:  None ordered at this time   Referrals:  None ordered at this time   Follow-Up:  At [facility], you and your health needs are our priority.  As part of our continuing mission to provide you with exceptional heart care, our providers are all part of one team.  This team includes your primary Cardiologist (physician) and Advanced Practice Providers or APPs (Physician Assistants and Nurse Practitioners) who all work together to provide you with the care you need, when you need it.  Your next appointment:   1 year(s)  Provider:    Redell Cave, MD or Barnie Hila, NP    We recommend signing up for the patient portal called MyChart.  Sign up information is provided on this After Visit Summary.  MyChart is used to connect with patients for Virtual Visits (Telemedicine).  Patients are able to view lab/test results, encounter notes, upcoming appointments, etc.  Non-urgent messages can be sent to your provider as well.   To learn more about what you can do with MyChart, go to [email or web address].

## 2024-10-24 ENCOUNTER — Ambulatory Visit: Payer: Self-pay | Admitting: Student

## 2024-10-24 LAB — LDL CHOLESTEROL, DIRECT: LDL Direct: 114 mg/dL — ABNORMAL HIGH (ref 0–99)

## 2024-10-26 ENCOUNTER — Encounter: Payer: Self-pay | Admitting: Podiatry

## 2024-10-26 ENCOUNTER — Ambulatory Visit: Admitting: Podiatry

## 2024-10-26 DIAGNOSIS — M79676 Pain in unspecified toe(s): Secondary | ICD-10-CM

## 2024-10-26 DIAGNOSIS — E1159 Type 2 diabetes mellitus with other circulatory complications: Secondary | ICD-10-CM

## 2024-10-26 DIAGNOSIS — B351 Tinea unguium: Secondary | ICD-10-CM

## 2024-10-26 DIAGNOSIS — L84 Corns and callosities: Secondary | ICD-10-CM

## 2024-10-26 NOTE — Progress Notes (Unsigned)
 Subjective:  Patient ID: Chelsey [name], female    DOB: Sep 16, 1951,  MRN: [id number]  Chelsey [name] presents to clinic today for at risk footcare. Patient has h/o diabetes, neuropathy and PAD and is seen for  and callus(es) of both feet and painful mycotic toenails that are difficult to trim. Painful toenails interfere with ambulation. Aggravating factors include wearing enclosed shoe gear. Pain is relieved with periodic professional debridement. Painful calluses are aggravated when weightbearing with and without shoegear. Pain is relieved with periodic professional debridement.  Chief Complaint  Patient presents with   Nail Problem    Thick painful toenails, 9 week follow up Medicaid ABN signed today   Diabetes    A1C  8.2   New problem(s): None.   PCP is Liana Fish, NP. LOV 10/03/24.  Allergies[1]  Review of Systems: Negative except as noted in the HPI.  Objective: No changes noted in today's physical examination. There were no vitals filed for this visit. Chelsey [name] is a pleasant 73 y.o. female obese in NAD. AAO x 3.  Vascular Examination: CFT <3 seconds b/l. DP/PT pulses faintly palpable b/l. Skin temperature gradient warm to warm b/l. No pain with calf compression. No ischemia or gangrene. No cyanosis or clubbing noted b/l. Trace edema b/l LE. Evidence of skin changes consistent with long term venous stasis BLE.   Neurological Examination: Protective sensation diminished with 10g monofilament b/l.  Dermatological Examination: No open wounds. No interdigital macerations.  Toenails 1-5 b/l thick, discolored, elongated with subungual debris and pain on dorsal palpation.    Pedal skin thin and atrophic b/l LE. Evidence of partial matrixectomy medial border left hallux. Hyperkeratotic lesion(s) plantar IPJ of left great toe, plantar IPJ of right great toe, and 5th metatarsal head left foot.  No erythema, no edema, no drainage, no  fluctuance.  Musculoskeletal Examination: Muscle strength 5/5 to all lower extremity muscle groups bilaterally. No pain, crepitus or joint limitation noted with ROM bilateral LE. Pes planus deformity noted bilateral LE.  Radiographs: None  Assessment/Plan: 1. Pain due to onychomycosis of toenail   2. Callus   3. Type 2 diabetes mellitus with vascular disease (HCC)   {Jgplan:[id number]::-Patient/POA to call should there be question/concern in the interim.}   Return in about 3 months (around 01/24/2025).  [name], DPM      [place] LOCATION: [address], [place] [address]                   Office [phone]   [place] LOCATION: [address] [place], [place] [address] Office [phone]     [1]  Allergies Allergen Reactions   Oxycodone-Acetaminophen  Other (See Comments)    Hallucinations Hallucinations HALLUCINATIONS Other reaction(s): Other (See Comments) HALLUCINATIONS Other reaction(s): Other (See Comments) Hallucinations Hallucinations HALLUCINATIONS Hallucinations Other reaction(s): Other (See Comments), Other (See Comments) HALLUCINATIONS Hallucinations Hallucinations HALLUCINATIONS Other reaction(s): Other (See Comments) HALLUCINATIONS Other reaction(s): Other (See Comments) Hallucinations Hallucinations HALLUCINATIONS Hallucinations Other reaction(s): Other (See Comments) HALLUCINATIONS Other reaction(s): Other (See Comments) Hallucinations Hallucinations HALLUCINATIONS Hallucinations    Indomethacin Other (See Comments)    BURN HOLE IN STOMACH BURN HOLE IN STOMACH Other reaction(s): Other (See Comments), Other (See Comments) BURN  HOLE IN STOMACH BURN HOLE IN STOMACH BURN HOLE IN STOMACH BURN HOLE IN STOMACH BURN HOLE IN STOMACH    Penicillins Rash

## 2024-10-27 LAB — BASIC METABOLIC PANEL WITH GFR
BUN/Creatinine Ratio: 24 (ref 12–28)
BUN: 39 mg/dL — ABNORMAL HIGH (ref 8–27)
CO2: 18 mmol/L — ABNORMAL LOW (ref 20–29)
Calcium: 9.6 mg/dL (ref 8.7–10.3)
Chloride: 109 mmol/L — ABNORMAL HIGH (ref 96–106)
Creatinine, Ser: 1.63 mg/dL — ABNORMAL HIGH (ref 0.57–1.00)
Glucose: 200 mg/dL — ABNORMAL HIGH (ref 70–99)
Potassium: 4.7 mmol/L (ref 3.5–5.2)
Sodium: 141 mmol/L (ref 134–144)
eGFR: 33 mL/min/1.73 — ABNORMAL LOW (ref >59)

## 2024-10-27 MED ORDER — ATORVASTATIN CALCIUM 20 MG PO TABS: 20.0000 mg | ORAL_TABLET | Freq: Every day | ORAL | 3 refills | Status: AC

## 2024-10-27 NOTE — Telephone Encounter (Signed)
 Answered an incoming call from the patient.  Patient confirms taking both Atorvastatin  10 mg once daily and Ezetimibe  10 mg once daily.  Patient advised to increase Atorvastatin  to 20 mg once daily and have repeat blood work in about 2 months.  Patient verbalized understanding will all questions and concerns addressed at this time.  Prescription for Atorvastatin  20 mg once daily sent to [facility] in [place].  Orders for CMP and Lipid panel in 2 months placed.

## 2024-10-27 NOTE — Telephone Encounter (Signed)
 Called and spoke with the patient.  Inquired whether the patient was taking both medications, Atorvastatin  and Ezetimibe . Patient unsure whether she is taking both, stating that she thinks my vascular doctor took me off that.  When asked if she can confirm that, patient stated my medicine bag is upstairs, I'll call you back when I go there.

## 2024-10-27 NOTE — Telephone Encounter (Signed)
-----   Message from Barnie Hila, NP sent at 10/24/2024  8:31 AM EST ----- Please let patient know LDL has increased further from August and is now 114. She confirmed she was taking both atorvastatin  and Zetia  at her office visit. Will you please confirm with her that she  is taking both. If she is, I would like to increase atorvastatin  to 20 mg daily. Repeat fasting lipid panel and CMP in 8-10 weeks.   Thank you!  DW  ----- Message ----- From: Rebecka Memos Lab Results In Sent: 10/24/2024   5:37 AM EST To: Barnie Hila, NP

## 2024-10-27 NOTE — Addendum Note (Signed)
 Addended by: Sapna Padron A on: 10/27/2024 03:42 PM   Modules accepted: Orders

## 2024-11-07 ENCOUNTER — Encounter: Payer: Self-pay | Admitting: Nurse Practitioner

## 2024-12-04 ENCOUNTER — Ambulatory Visit (INDEPENDENT_AMBULATORY_CARE_PROVIDER_SITE_OTHER): Admitting: Nurse Practitioner

## 2024-12-04 ENCOUNTER — Encounter: Payer: Self-pay | Admitting: Nurse Practitioner

## 2024-12-04 VITALS — BP 102/63 | HR 75 | Temp 95.6°F | Resp 16 | Ht 64.0 in | Wt 207.4 lb

## 2024-12-04 DIAGNOSIS — E785 Hyperlipidemia, unspecified: Secondary | ICD-10-CM

## 2024-12-04 DIAGNOSIS — I152 Hypertension secondary to endocrine disorders: Secondary | ICD-10-CM | POA: Diagnosis not present

## 2024-12-04 DIAGNOSIS — E1159 Type 2 diabetes mellitus with other circulatory complications: Secondary | ICD-10-CM | POA: Diagnosis not present

## 2024-12-04 DIAGNOSIS — E1122 Type 2 diabetes mellitus with diabetic chronic kidney disease: Secondary | ICD-10-CM

## 2024-12-04 DIAGNOSIS — Z79899 Other long term (current) drug therapy: Secondary | ICD-10-CM | POA: Diagnosis not present

## 2024-12-04 DIAGNOSIS — Z794 Long term (current) use of insulin: Secondary | ICD-10-CM | POA: Diagnosis not present

## 2024-12-04 DIAGNOSIS — N1832 Chronic kidney disease, stage 3b: Secondary | ICD-10-CM | POA: Diagnosis not present

## 2024-12-04 DIAGNOSIS — E1169 Type 2 diabetes mellitus with other specified complication: Secondary | ICD-10-CM

## 2024-12-04 MED ORDER — OLMESARTAN-AMLODIPINE-HCTZ 40-10-25 MG PO TABS: 1.0000 | ORAL_TABLET | Freq: Every day | ORAL | 1 refills | Status: AC

## 2024-12-04 MED ORDER — FUROSEMIDE 20 MG PO TABS: 20.0000 mg | ORAL_TABLET | Freq: Every day | ORAL | 3 refills | Status: AC | PRN

## 2024-12-04 MED ORDER — LINACLOTIDE 145 MCG PO CAPS: 145.0000 ug | ORAL_CAPSULE | Freq: Every day | ORAL | 4 refills | Status: AC

## 2024-12-04 NOTE — Progress Notes (Signed)
 [facility] [address] [place], [place] [address]  Internal MEDICINE  Office Visit Note  Patient Name: Chelsey [name]  [id number]  [id number]  Date of Service: 12/04/2024  Chief Complaint  Patient presents with   Diabetes   Gastroesophageal Reflux   Hypertension   Follow-up    HPI Chelsey [name] presents for a follow-up visit for diabetes, CKD, RA, hypertension and high cholesterol.  Elevated FLCs per nephrology -- discussed with Dr. Dennise, this is expected in CKD and the ratio was normal so no further intervention needed.  Diabetes -- A1c is still elevated but improving Weight loss -- down 10 lbs since December CKD -- monitored by nephrology.  RA -- sees [facility] rheumatology.  High cholesterol -- taking atorvastatin  daily Hypertension -- controlled with current medication.     Current Medication: Outpatient Encounter Medications as of 12/04/2024  Medication Sig   aspirin  EC 81 MG tablet Take 1 tablet (81 mg total) by mouth daily.   atorvastatin  (LIPITOR) 20 MG tablet Take 1 tablet (20 mg total) by mouth daily. TAKE ONE TABLET BY MOUTH ONCE DAILY FOR cholesterol   BAQSIMI TWO PACK 3 MG/DOSE POWD    celecoxib  (CELEBREX ) 100 MG capsule TAKE 1 CAPSULE(100 MG) BY MOUTH DAILY   chlorhexidine  (PERIDEX ) 0.12 % solution    cilostazol  (PLETAL ) 50 MG tablet Take 1 tablet (50 mg total) by mouth 2 (two) times daily.   Continuous Glucose Receiver (DEXCOM G7 RECEIVER) DEVI See admin instructions.   dapagliflozin  propanediol (FARXIGA ) 10 MG TABS tablet Take 1 tablet (10 mg total) by mouth daily.   diclofenac Sodium (VOLTAREN) 1 % GEL Apply 2 g topically 4 (four) times daily.   ezetimibe  (ZETIA ) 10 MG tablet Take 1 tablet (10 mg total) by mouth daily.   febuxostat  (ULORIC ) 40 MG tablet Take 1 tablet (40 mg total) by mouth daily.   ferrous sulfate  (FEROSUL) 325 (65 FE) MG tablet Take 1 tablet (325 mg total) by mouth daily with breakfast.   fluticasone -salmeterol (ADVAIR) 250-50  MCG/ACT AEPB INHALE 1 PUFF BY MOUTH INTO LUNGS IN THE MORNING and AT BEDTIME   furosemide  (LASIX ) 20 MG tablet Take 1 tablet (20 mg total) by mouth daily as needed (increased swelling of legs).   gabapentin  (NEURONTIN ) 300 MG capsule Take 1 capsule (300 mg total) by mouth 3 (three) times daily.   glucose blood test strip Test sugar 4 x aday for uncontrolled dm on insulin  E11.22   hydroxychloroquine  (PLAQUENIL ) 200 MG tablet Take 1 tablet (200 mg total) by mouth 2 (two) times daily.   insulin  lispro (HUMALOG ) 100 UNIT/ML KwikPen Inject 15 units three times daily before each meal   Insulin  Pen Needle (COMFORT EZ PEN NEEDLES) 31G X 8 MM MISC Twice a day.   LANCETS ULTRA THIN MISC Apply 1 each topically daily.    LANTUS  SOLOSTAR 100 UNIT/ML Solostar Pen Inject 50 units once daily, at the same time every day   levothyroxine  (SYNTHROID ) 50 MCG tablet TAKE ONE TABLET BY MOUTH EVERY MORNING ON AN EMPTY STOMACH   linaclotide  (LINZESS ) 145 MCG CAPS capsule Take 1 capsule (145 mcg total) by mouth daily before breakfast.   Olmesartan -amLODIPine -HCTZ 40-10-25 MG TABS Take 1 tablet by mouth daily.   omeprazole  (PRILOSEC) 40 MG capsule Take 1 capsule (40 mg total) by mouth daily.   Semaglutide , 2 MG/DOSE, (OZEMPIC , 2 MG/DOSE,) 8 MG/3ML SOPN Inject 2 mg  into the skin once a weekly every Wednesday   [DISCONTINUED] furosemide  (LASIX ) 20 MG tablet  Take 1 tablet (20 mg total) by mouth daily as needed (increased swelling of legs).   [DISCONTINUED] linaclotide  (LINZESS ) 145 MCG CAPS capsule Take 1 capsule (145 mcg total) by mouth daily before breakfast.   [DISCONTINUED] Olmesartan -amLODIPine -HCTZ 40-10-25 MG TABS Take 1 tablet by mouth daily.   No facility-administered encounter medications on file as of 12/04/2024.    Surgical History: Past Surgical History:  Procedure Laterality Date   BRAIN SURGERY  1990's   CATARACT EXTRACTION W/PHACO Left 06/11/2016   Procedure: CATARACT EXTRACTION PHACO AND INTRAOCULAR LENS  PLACEMENT (IOC);  Surgeon: Elsie Carmine, MD;  Location: [facility] ORS;  Service: Ophthalmology;  Laterality: Left;  US  1.01 AP% 21.6 CDE 13.23 Fluid pack lot # [id number]   CEREBRAL ANEURYSM REPAIR  1990's   COILS   CORONARY ARTERY BYPASS GRAFT     EYE SURGERY  2000's   bilateral cataract   FRACTURE SURGERY     HERNIA REPAIR  2000   ventral   STENT PLACEMENT VASCULAR ([facility] HX)     VEIN BYPASS SURGERY     VENTRAL HERNIA REPAIR N/A 04/29/2016   Procedure: Laparoscopic HERNIA REPAIR VENTRAL ADULT;  Surgeon: Laneta JULIANNA Luna, MD;  Location: [facility] ORS;  Service: General;  Laterality: N/A;    Medical History: Past Medical History:  Diagnosis Date   Anemia    Aneurysm    Arthritis    RHEUMATOID ARTHRITIS   Asthma    Collagen vascular disease    COPD (chronic obstructive pulmonary disease) (HCC)    Coronary artery disease    Dementia (HCC)    Diabetes mellitus without complication (HCC)    Edema    FEET/LEGS   GERD (gastroesophageal reflux disease)    Gout    H/O wheezing    History of hiatal hernia    Hypertension    Hypothyroidism    Neuropathy    Oxygen  deficiency    HS   Peripheral vascular disease    Sarcoidosis of lung    Seizures (HCC)    WITH BRAIN 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    Sleep apnea    OXYGEN  AT NIGHT 3 L [place]    Family History: Family History  Problem Relation Age of Onset   Heart disease Mother    Hypertension Mother    Diabetes Mother    Diabetes Father    Heart disease Father    Hypertension Father    Breast cancer Maternal Aunt     Social History   Socioeconomic History   Marital status: Single    Spouse name: Not on file   Number of children: Not on file   Years of education: Not on file   Highest education level: Not on file  Occupational History   Not on file  Tobacco Use   Smoking status: Former    Current packs/day: 0.00    Average packs/day: 1 pack/day for 30.0 years (30.0 ttl pk-yrs)    Types: Cigarettes    Start date: 01/28/1976     Quit date: 01/27/2006    Years since quitting: 18.8    Passive exposure: Past   Smokeless tobacco: Never   Tobacco comments:    quit   Vaping Use   Vaping status: Never Used  Substance and Sexual Activity   Alcohol use: No    Alcohol/week: 0.0 standard drinks of alcohol   Drug use: No   Sexual activity: Not on file  Other Topics Concern   Not on file  Social History Narrative   Not  on file   Social Drivers of Health   Tobacco Use: Medium Risk (12/04/2024)   Patient History    Smoking Tobacco Use: Former    Smokeless Tobacco Use: Never    Passive Exposure: Past  Physicist, Medical Strain: Low Risk  (04/25/2024)   Received from [facility]   Overall Financial Resource Strain (CARDIA)    Difficulty of Paying Living Expenses: Not hard at all  Food Insecurity: No Food Insecurity (04/25/2024)   Received from [facility]   Epic    Within the past 12 months, you worried that your food would run out before you got the money to buy more.: Never true    Within the past 12 months, the food you bought just didn't last and you didn't have money to get more.: Never true  Transportation Needs: No Transportation Needs (04/25/2024)   Received from [facility] - Transportation    In the past 12 months, has lack of transportation kept you from medical appointments or from getting medications?: No    Lack of Transportation (Non-Medical): No  Physical Activity: Not on file  Stress: Not on file  Social Connections: Not on file  Intimate Partner Violence: Not on file  Depression (PHQ2-9): Low Risk (10/10/2024)   Depression (PHQ2-9)    PHQ-2 Score: 0  Alcohol Screen: Low Risk (04/14/2022)   Alcohol Screen    Last Alcohol Screening Score (AUDIT): 0  Housing: Unknown (04/25/2024)   Received from [facility]   Epic    In the last 12 months, was there a time when you were not able to pay the mortgage or rent on time?:  No    Number of Times Moved in the Last Year: Not on file    At any time in the past 12 months, were you homeless or living in a shelter (including now)?: No  Utilities: Not At Risk (04/25/2024)   Received from [facility]   Epic    In the past 12 months has the electric, gas, oil, or water company threatened to shut off services in your home?: No  Health Literacy: Not on file      Review of Systems  Constitutional:  Positive for fatigue. Negative for activity change, appetite change, chills, fever and unexpected weight change.  HENT:  Negative for congestion, ear pain, mouth sores, rhinorrhea, sore throat and trouble swallowing.   Eyes: Negative.   Respiratory: Negative.  Negative for cough, chest tightness, shortness of breath and wheezing.   Cardiovascular: Negative.  Negative for chest pain and palpitations.  Gastrointestinal: Negative.  Negative for abdominal pain, blood in stool, constipation, diarrhea, nausea and vomiting.  Endocrine: Negative.   Genitourinary: Negative.  Negative for difficulty urinating, dysuria, frequency, hematuria and urgency.  Musculoskeletal:  Positive for arthralgias, back pain and gait problem. Negative for joint swelling, myalgias and neck pain.  Skin: Negative.  Negative for rash and wound.  Allergic/Immunologic: Negative.  Negative for immunocompromised state.  Neurological:  Positive for weakness (lower extremities). Negative for dizziness, seizures, numbness and headaches.  Hematological: Negative.   Psychiatric/Behavioral: Negative.  Negative for behavioral problems, self-injury, sleep disturbance and suicidal ideas. The patient is not nervous/anxious.     Vital Signs: BP 102/63   Pulse 75   Temp (!) 95.6 F (35.3 C)   Resp 16   Ht 5' 4 (1.626 m)   Wt 207 lb 6.4 oz (94.1 kg)   SpO2  93%   BMI 35.60 kg/m    Physical Exam Vitals reviewed.  Constitutional:      General: She is awake. She is not in acute distress.     Appearance: Normal appearance. She is well-developed and well-groomed. She is obese. She is not ill-appearing or diaphoretic.  HENT:     Head: Normocephalic and atraumatic.     Right Ear: Tympanic membrane, ear canal and external ear normal.     Left Ear: Tympanic membrane, ear canal and external ear normal.     Nose: Septal deviation present. No congestion or rhinorrhea.     Right Turbinates: Enlarged.     Left Turbinates: Enlarged.     Mouth/Throat:     Lips: Pink.     Mouth: Mucous membranes are moist. Oral lesions present.     Dentition: Abnormal dentition.     Tongue: Lesions present.     Pharynx: Oropharynx is clear. Uvula midline. No oropharyngeal exudate or posterior oropharyngeal erythema.  Eyes:     General: Lids are normal. Vision grossly intact. Gaze aligned appropriately. No scleral icterus.       Right eye: No discharge.        Left eye: No discharge.     Extraocular Movements: Extraocular movements intact.     Conjunctiva/sclera: Conjunctivae normal.     Pupils: Pupils are equal, round, and reactive to light.     Funduscopic exam:    Right eye: Red reflex present.        Left eye: Red reflex present. Neck:     Thyroid : No thyromegaly.     Vascular: No JVD.     Trachea: Trachea and phonation normal. No tracheal deviation.  Cardiovascular:     Rate and Rhythm: Normal rate and regular rhythm.     Pulses:          Carotid pulses are 3+ on the right side and 3+ on the left side.      Radial pulses are 2+ on the right side and 2+ on the left side.       Dorsalis pedis pulses are 2+ on the right side and 2+ on the left side.       Posterior tibial pulses are 2+ on the right side and 2+ on the left side.     Heart sounds: Normal heart sounds, S1 normal and S2 normal. No murmur heard.    No friction rub. No gallop.  Pulmonary:     Effort: Pulmonary effort is normal. No accessory muscle usage or respiratory distress.     Breath sounds: Normal breath sounds and air entry.  No stridor. No wheezing or rales.  Chest:     Chest wall: No tenderness.  Breasts:    Breasts are symmetrical.     Right: Normal. No swelling, bleeding, inverted nipple, mass, nipple discharge, skin change or tenderness.     Left: Normal. No swelling, bleeding, inverted nipple, mass, nipple discharge, skin change or tenderness.     Comments: Declined clinical breast exam and mammogram. Abdominal:     General: Bowel sounds are normal. There is no distension.     Palpations: Abdomen is soft. There is no shifting dullness, fluid wave, mass or pulsatile mass.     Tenderness: There is no abdominal tenderness. There is no guarding or rebound.  Musculoskeletal:        General: No tenderness or deformity.     Cervical back: Normal range of motion and neck supple.  Right lower leg: Edema present.     Left lower leg: Edema present.     Right foot: Decreased range of motion. Bunion present.     Left foot: Decreased range of motion. Bunion present.  Feet:     Right foot:     Protective Sensation: 6 sites tested.  6 sites sensed.     Skin integrity: Callus and dry skin present. No ulcer, blister, skin breakdown, erythema, warmth or fissure.     Toenail Condition: Right toenails are abnormally thick.     Left foot:     Protective Sensation: 6 sites tested.  6 sites sensed.     Skin integrity: Callus and dry skin present. No ulcer, blister, skin breakdown, erythema, warmth or fissure.     Toenail Condition: Left toenails are abnormally thick.  Lymphadenopathy:     Cervical: No cervical adenopathy.     Upper Body:     Right upper body: No supraclavicular, axillary or pectoral adenopathy.     Left upper body: No supraclavicular, axillary or pectoral adenopathy.  Skin:    General: Skin is warm and dry.     Capillary Refill: Capillary refill takes less than 2 seconds.     Coloration: Skin is not pale.     Findings: No erythema or rash.  Neurological:     Mental Status: She is alert and  oriented to person, place, and time.     Cranial Nerves: No cranial nerve deficit.     Motor: No abnormal muscle tone.     Coordination: Coordination normal.     Gait: Gait normal.     Deep Tendon Reflexes: Reflexes are normal and symmetric.  Psychiatric:        Mood and Affect: Mood and affect normal.        Behavior: Behavior normal. Behavior is cooperative.        Thought Content: Thought content normal.        Judgment: Judgment normal.        Assessment/Plan: 1. Type 2 diabetes mellitus with stage 3b chronic kidney disease, with long-term current use of insulin  (HCC) (Primary) Monitored by [facility] endocrinology. Urine sent to lab for ACR.  - Urine Microalbumin w/creat. ratio  2. Hypertension associated with type 2 diabetes mellitus (HCC) Stable, continue medications as prescribed.  - furosemide  (LASIX ) 20 MG tablet; Take 1 tablet (20 mg total) by mouth daily as needed (increased swelling of legs).  Dispense: 30 tablet; Refill: 3 - Olmesartan -amLODIPine -HCTZ 40-10-25 MG TABS; Take 1 tablet by mouth daily.  Dispense: 90 tablet; Refill: 1  3. CKD stage 3b, GFR 30-44 ml/min (HCC) Stable, continue medications as prescribed. Followed by nephrology.  - furosemide  (LASIX ) 20 MG tablet; Take 1 tablet (20 mg total) by mouth daily as needed (increased swelling of legs).  Dispense: 30 tablet; Refill: 3 - Olmesartan -amLODIPine -HCTZ 40-10-25 MG TABS; Take 1 tablet by mouth daily.  Dispense: 90 tablet; Refill: 1  4. Hyperlipidemia associated with type 2 diabetes mellitus (HCC) Continue atorvastatin  as prescribed.   5. Encounter for medication review Medication list reviewed, updated and refills ordered.  - linaclotide  (LINZESS ) 145 MCG CAPS capsule; Take 1 capsule (145 mcg total) by mouth daily before breakfast.  Dispense: 30 capsule; Refill: 4   General Counseling: Chelsey [name] verbalizes understanding of the findings of todays visit and agrees with plan of treatment. I have discussed any further  diagnostic evaluation that may be needed or ordered today. We also reviewed her medications today. she has been  encouraged to call the office with any questions or concerns that should arise related to todays visit.    Orders Placed This Encounter  Procedures   Urine Microalbumin w/creat. ratio    Meds ordered this encounter  Medications   furosemide  (LASIX ) 20 MG tablet    Sig: Take 1 tablet (20 mg total) by mouth daily as needed (increased swelling of legs).    Dispense:  30 tablet    Refill:  3   linaclotide  (LINZESS ) 145 MCG CAPS capsule    Sig: Take 1 capsule (145 mcg total) by mouth daily before breakfast.    Dispense:  30 capsule    Refill:  4    Fill new script today   Olmesartan -amLODIPine -HCTZ 40-10-25 MG TABS    Sig: Take 1 tablet by mouth daily.    Dispense:  90 tablet    Refill:  1    Return in about 3 months (around 03/04/2025) for F/U, Kashis Penley PCP.   Total time spent:30 Minutes Time spent includes review of chart, medications, test results, and follow up plan with the patient.   [place] Controlled Substance Database was reviewed by me.  This patient was seen by Mardy Maxin, FNP-C in collaboration with Dr. Sigrid Bathe as a part of collaborative care agreement.   Kaimani Clayson R. Maxin, MSN, FNP-C Internal medicine

## 2024-12-05 LAB — MICROALBUMIN / CREATININE URINE RATIO
Creatinine, Urine: 82.6 mg/dL
Microalb/Creat Ratio: <4 mg/g{creat} (ref 0–29)
Microalbumin, Urine: <3 ug/mL

## 2025-01-25 ENCOUNTER — Ambulatory Visit: Admitting: Podiatry

## 2025-03-05 ENCOUNTER — Ambulatory Visit: Admitting: Nurse Practitioner

## 2025-05-21 ENCOUNTER — Ambulatory Visit: Admitting: Nurse Practitioner
